# Patient Record
Sex: Female | Born: 1990 | Race: White | Hispanic: No | Marital: Married | Smoking: Current every day smoker
Health system: Southern US, Community
[De-identification: ages and names within clinical notes are randomized; demographics above are authoritative.]

## PROBLEM LIST (undated history)

## (undated) DIAGNOSIS — J45909 Unspecified asthma, uncomplicated: Secondary | ICD-10-CM

## (undated) DIAGNOSIS — E785 Hyperlipidemia, unspecified: Secondary | ICD-10-CM

## (undated) DIAGNOSIS — K219 Gastro-esophageal reflux disease without esophagitis: Secondary | ICD-10-CM

## (undated) DIAGNOSIS — R0602 Shortness of breath: Secondary | ICD-10-CM

## (undated) DIAGNOSIS — R51 Headache: Secondary | ICD-10-CM

## (undated) DIAGNOSIS — R519 Headache, unspecified: Secondary | ICD-10-CM

## (undated) DIAGNOSIS — M2550 Pain in unspecified joint: Secondary | ICD-10-CM

## (undated) DIAGNOSIS — E119 Type 2 diabetes mellitus without complications: Secondary | ICD-10-CM

## (undated) DIAGNOSIS — M549 Dorsalgia, unspecified: Secondary | ICD-10-CM

## (undated) DIAGNOSIS — F32A Depression, unspecified: Secondary | ICD-10-CM

## (undated) DIAGNOSIS — M1990 Unspecified osteoarthritis, unspecified site: Secondary | ICD-10-CM

## (undated) DIAGNOSIS — M92219 Osteochondrosis (juvenile) of carpal lunate [Kienbock], unspecified hand: Secondary | ICD-10-CM

## (undated) DIAGNOSIS — R600 Localized edema: Secondary | ICD-10-CM

## (undated) DIAGNOSIS — E7800 Pure hypercholesterolemia, unspecified: Secondary | ICD-10-CM

## (undated) DIAGNOSIS — O139 Gestational [pregnancy-induced] hypertension without significant proteinuria, unspecified trimester: Secondary | ICD-10-CM

## (undated) DIAGNOSIS — Z87442 Personal history of urinary calculi: Secondary | ICD-10-CM

## (undated) DIAGNOSIS — Z8489 Family history of other specified conditions: Secondary | ICD-10-CM

## (undated) DIAGNOSIS — G4733 Obstructive sleep apnea (adult) (pediatric): Secondary | ICD-10-CM

## (undated) DIAGNOSIS — H4710 Unspecified papilledema: Secondary | ICD-10-CM

## (undated) DIAGNOSIS — R7303 Prediabetes: Secondary | ICD-10-CM

## (undated) DIAGNOSIS — B009 Herpesviral infection, unspecified: Secondary | ICD-10-CM

## (undated) DIAGNOSIS — J40 Bronchitis, not specified as acute or chronic: Secondary | ICD-10-CM

## (undated) DIAGNOSIS — G2581 Restless legs syndrome: Secondary | ICD-10-CM

## (undated) DIAGNOSIS — F419 Anxiety disorder, unspecified: Secondary | ICD-10-CM

## (undated) DIAGNOSIS — I10 Essential (primary) hypertension: Secondary | ICD-10-CM

## (undated) DIAGNOSIS — S82892A Other fracture of left lower leg, initial encounter for closed fracture: Secondary | ICD-10-CM

## (undated) DIAGNOSIS — G932 Benign intracranial hypertension: Secondary | ICD-10-CM

## (undated) DIAGNOSIS — R000 Tachycardia, unspecified: Secondary | ICD-10-CM

## (undated) HISTORY — DX: Localized edema: R60.0

## (undated) HISTORY — DX: Pure hypercholesterolemia, unspecified: E78.00

## (undated) HISTORY — DX: Type 2 diabetes mellitus without complications: E11.9

## (undated) HISTORY — DX: Pain in unspecified joint: M25.50

## (undated) HISTORY — DX: Anxiety disorder, unspecified: F41.9

## (undated) HISTORY — PX: OTHER SURGICAL HISTORY: SHX169

## (undated) HISTORY — DX: Prediabetes: R73.03

## (undated) HISTORY — DX: Unspecified papilledema: H47.10

## (undated) HISTORY — DX: Benign intracranial hypertension: G93.2

## (undated) HISTORY — DX: Obstructive sleep apnea (adult) (pediatric): G47.33

## (undated) HISTORY — DX: Restless legs syndrome: G25.81

## (undated) HISTORY — DX: Shortness of breath: R06.02

## (undated) HISTORY — DX: Dorsalgia, unspecified: M54.9

## (undated) HISTORY — DX: Unspecified asthma, uncomplicated: J45.909

## (undated) HISTORY — DX: Hyperlipidemia, unspecified: E78.5

## (undated) HISTORY — DX: Gastro-esophageal reflux disease without esophagitis: K21.9

## (undated) HISTORY — DX: Gestational (pregnancy-induced) hypertension without significant proteinuria, unspecified trimester: O13.9

## (undated) HISTORY — DX: Tachycardia, unspecified: R00.0

## (undated) HISTORY — DX: Depression, unspecified: F32.A

## (undated) HISTORY — DX: Osteochondrosis (juvenile) of carpal lunate (kienbock), unspecified hand: M92.219

---
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
## 2011-06-13 DIAGNOSIS — I10 Essential (primary) hypertension: Secondary | ICD-10-CM

## 2011-06-13 HISTORY — PX: FRACTURE SURGERY: SHX138

## 2011-06-13 HISTORY — DX: Essential (primary) hypertension: I10

## 2011-12-04 DIAGNOSIS — I10 Essential (primary) hypertension: Secondary | ICD-10-CM | POA: Insufficient documentation

## 2011-12-04 DIAGNOSIS — I152 Hypertension secondary to endocrine disorders: Secondary | ICD-10-CM | POA: Insufficient documentation

## 2011-12-04 DIAGNOSIS — R000 Tachycardia, unspecified: Secondary | ICD-10-CM | POA: Insufficient documentation

## 2013-06-12 HISTORY — PX: WRIST SURGERY: SHX841

## 2014-01-21 DIAGNOSIS — E785 Hyperlipidemia, unspecified: Secondary | ICD-10-CM | POA: Insufficient documentation

## 2014-01-21 DIAGNOSIS — E7800 Pure hypercholesterolemia, unspecified: Secondary | ICD-10-CM | POA: Insufficient documentation

## 2014-01-21 DIAGNOSIS — E1169 Type 2 diabetes mellitus with other specified complication: Secondary | ICD-10-CM | POA: Insufficient documentation

## 2014-01-30 DIAGNOSIS — M931 Kienbock's disease of adults: Secondary | ICD-10-CM | POA: Insufficient documentation

## 2014-04-28 DIAGNOSIS — M25539 Pain in unspecified wrist: Secondary | ICD-10-CM | POA: Insufficient documentation

## 2015-06-12 ENCOUNTER — Emergency Department (HOSPITAL_COMMUNITY): Payer: BLUE CROSS/BLUE SHIELD

## 2015-06-12 ENCOUNTER — Emergency Department (HOSPITAL_COMMUNITY)
Admission: EM | Admit: 2015-06-12 | Discharge: 2015-06-12 | Disposition: A | Discharge status: Home / Self Care | Payer: BLUE CROSS/BLUE SHIELD | Attending: Emergency Medicine | Admitting: Emergency Medicine

## 2015-06-12 ENCOUNTER — Encounter (HOSPITAL_COMMUNITY): Payer: Self-pay | Admitting: Nurse Practitioner

## 2015-06-12 DIAGNOSIS — I10 Essential (primary) hypertension: Secondary | ICD-10-CM | POA: Insufficient documentation

## 2015-06-12 DIAGNOSIS — M19032 Primary osteoarthritis, left wrist: Secondary | ICD-10-CM | POA: Diagnosis not present

## 2015-06-12 DIAGNOSIS — M19072 Primary osteoarthritis, left ankle and foot: Secondary | ICD-10-CM | POA: Insufficient documentation

## 2015-06-12 DIAGNOSIS — Z8781 Personal history of (healed) traumatic fracture: Secondary | ICD-10-CM | POA: Insufficient documentation

## 2015-06-12 DIAGNOSIS — G932 Benign intracranial hypertension: Secondary | ICD-10-CM | POA: Diagnosis not present

## 2015-06-12 DIAGNOSIS — Z8709 Personal history of other diseases of the respiratory system: Secondary | ICD-10-CM | POA: Insufficient documentation

## 2015-06-12 DIAGNOSIS — H539 Unspecified visual disturbance: Secondary | ICD-10-CM | POA: Diagnosis present

## 2015-06-12 HISTORY — DX: Essential (primary) hypertension: I10

## 2015-06-12 HISTORY — DX: Unspecified osteoarthritis, unspecified site: M19.90

## 2015-06-12 HISTORY — DX: Other fracture of left lower leg, initial encounter for closed fracture: S82.892A

## 2015-06-12 HISTORY — DX: Bronchitis, not specified as acute or chronic: J40

## 2015-06-12 MED ORDER — ACETAZOLAMIDE 125 MG PO TABS: 325.0000 mg | ORAL_TABLET | Freq: Three times a day (TID) | ORAL | Status: DC

## 2015-06-12 MED ORDER — ACETAZOLAMIDE 125 MG PO TABS: 125.0000 mg | ORAL_TABLET | Freq: Three times a day (TID) | ORAL | Status: DC

## 2015-06-12 MED ORDER — LISINOPRIL 10 MG PO TABS: 10.0000 mg | ORAL_TABLET | Freq: Every day | ORAL | Status: DC

## 2015-06-12 NOTE — ED Provider Notes (Signed)
Medical screening examination/treatment/procedure(s) were conducted as a shared visit with non-physician practitioner(s) and myself.  I personally evaluated the patient during the encounter.  Pt is medical stable in no acute distress.  Hx of pseudotumor.  Has not been on meds for years.  Pt has had prior MRI and LP to confirm the diagnosis in the past.  Pt was seen by an optometrist on Wednesday to be evaluated in the ED.  Unfortunately, pt does not fit in the MRI at this facility.  Pt will require fluoro guided lp because of her body size.  This is not available on Saturday.    I do not think she requires emergent LP.  Her diagnosis is known.  Therapeutic lp may be beneficial but this can be scheduled.  Will start her back on her BP meds and diamox.  Urgent referral to neurology.  Linwood DibblesJon Knapp, MD 06/12/15 [phone]

## 2015-06-12 NOTE — ED Provider Notes (Signed)
[name]: [id number]     Arrival date & time 06/12/15  1356 History   First MD Initiated Contact with Patient 06/12/15 1400     Chief Complaint  Patient presents with  . Eye Problem    HPI   Cassandra Tucker is a 24 y.o. female with a PMH of psuedotumor cerebri who presents to the ED with optic disc edema. She was sent by her optometrist after being evaluated on Wednesday. She reports headache for the past week with associated dizziness and lightheadedness. She also states she has experienced blurry vision. She denies exacerbating factors. She has not tried anything for symptom relief. She denies fever, chills, chest pain, shortness of breath, abdominal pain, N/V/D/C, numbness, weakness, paresthesia. She states she was previously being followed by neurology and was taking diamox for her pseudotumor cerebri, however has not been seen and has not been taking her medication for the past 3 years.   Past Medical History  Diagnosis Date  . Arthritis     left wrist and left ankle  . Bronchitis   . Hypertension 2013  . Ankle fracture, left 2000   Past Surgical History  Procedure Laterality Date  . Fracture surgery     No family history on file. Social History  Substance Use Topics  . Smoking status: None  . Smokeless tobacco: None  . Alcohol Use: None   OB History    No data available       Review of Systems  Constitutional: Negative for fever and chills.  Eyes: Positive for visual disturbance.  Respiratory: Negative for shortness of breath.   Cardiovascular: Negative for chest pain.  Gastrointestinal: Negative for nausea, vomiting, abdominal pain, diarrhea and constipation.  Neurological: Positive for dizziness, light-headedness and headaches. Negative for syncope, weakness and numbness.  All other systems reviewed and are negative.     Allergies  Review of patient's allergies indicates no known allergies.  Home Medications   Prior to Admission medications   Medication  Sig Start Date End Date Taking? Authorizing Provider  ibuprofen (ADVIL,MOTRIN) 200 MG tablet Take 400 mg by mouth every 6 (six) hours as needed for moderate pain.   Yes Historical Provider, MD  acetaZOLAMIDE (DIAMOX) 125 MG tablet Take 2.5 tablets (312.5 mg total) by mouth 3 (three) times daily. 06/12/15   Mady GemmaElizabeth C Westfall, PA-C  lisinopril (PRINIVIL,ZESTRIL) 10 MG tablet Take 1 tablet (10 mg total) by mouth daily. 06/12/15   Mady GemmaElizabeth C Westfall, PA-C    BP 198/123 mmHg  Pulse 95  Temp(Src) 98.3 F (36.8 C) (Oral)  Resp 21  SpO2 98% Physical Exam  Constitutional: She is oriented to person, place, and time. She appears well-developed and well-nourished. No distress.  HENT:  Head: Normocephalic and atraumatic.  Right Ear: External ear normal.  Left Ear: External ear normal.  Nose: Nose normal.  Mouth/Throat: Uvula is midline, oropharynx is clear and moist and mucous membranes are normal.  Eyes: Conjunctivae, EOM and lids are normal. Pupils are equal, round, and reactive to light. Right eye exhibits no discharge. Left eye exhibits no discharge. No scleral icterus.  Neck: Normal range of motion. Neck supple.  Cardiovascular: Normal rate, regular rhythm, normal heart sounds, intact distal pulses and normal pulses.   Pulmonary/Chest: Effort normal and breath sounds normal. No respiratory distress. She has no wheezes. She has no rales.  Abdominal: Soft. Normal appearance and bowel sounds are normal. She exhibits no distension and no mass. There is no tenderness. There is no rigidity, no  rebound and no guarding.  Musculoskeletal: Normal range of motion. She exhibits no edema or tenderness.  Neurological: She is alert and oriented to person, place, and time. She has normal strength. No cranial nerve deficit or sensory deficit.  Skin: Skin is warm, dry and intact. No rash noted. She is not diaphoretic. No erythema. No pallor.  Psychiatric: She has a normal mood and affect. Her speech is normal  and behavior is normal.  Nursing note and vitals reviewed.   ED Course  Procedures (including critical care time)  Labs Review Labs Reviewed - No data to display  Imaging Review No results found.     EKG Interpretation None      MDM   Final diagnoses:  Pseudotumor cerebri    25 year old female with history of pseudotumor cerebri presents after being referred by her optometrist for optic disc edema. She was advised to follow up in the ED for MRI and LP. Patient reports headache for the past week as well as associated dizziness, lightheadedness, and blurry vision. She denies fever, chills, chest pain, shortness of breath, abdominal pain, N/V/D/C, numbness, weakness, paresthesia. Of note, patient was previously treated for pseudotumor cerebri at [facility] in [place], however has not been seen by her neurologist and has not been taking diamox for the past 3 years (has had MRI and LP to confirm diagnosis). She also reports she is supposed to be taking lisinopril for hypertension, however has not been taking this medication for "a long time."   Patient is afebrile. Mildly hypertensive. Heart regular rate and rhythm. Lungs clear to auscultation bilaterally. Abdomen soft, nontender, nondistended. Normal neuro exam with no focal deficit.  MRI ordered, however advised by radiology the patient is not able to fit in the MRI here. Due to patient's body habitus, LP will likely need to be done by radiology.  Spoke with Dr. Benard Rink, neuroradiologist, who advised MRI and fluoro LP is not urgent given patient's known history of pseudotumor cerebri and patient can call next week to schedule as outpatient.   Patient discussed with and seen by Dr. Lynelle Doctor. Patient is nontoxic and well-appearing, feel she is stable for discharge at this time. MRI and LP not emergent given known diagnosis of pseudotumor cerebri. Patient to follow up with neurology, ambulatory referral ordered. Will refill lisinopril and  diamox. Return precautions discussed at length. Patient verbalizes her understanding and is in agreement with plan.   BP 198/123 mmHg  Pulse 95  Temp(Src) 98.3 F (36.8 C) (Oral)  Resp 21  SpO2 98%   Mady Gemma, PA-C 06/12/15 1624

## 2015-06-12 NOTE — Discharge Instructions (Signed)
1. Medications: lisinopril, diamox, usual home medications 2. Treatment: rest, drink plenty of fluids 3. Follow Up: please followup with neurology (they will call you) for discussion of your diagnoses and further evaluation after today's visit; if you do not have a primary care doctor use the resource guide provided to find one; please return to the ER for severe pain, dizziness, numbness, weakness, new or worsening symptoms    Pseudotumor Cerebri Pseudotumor cerebri, also called idiopathic intracranial hypertension, is a condition that occurs due to increased pressure within your skull. Although some of the symptoms resemble those of a brain tumor, it is not a brain tumor. Symptoms occur when the increased pressure in your skull compresses brain structures. For example, pressure on the nerve responsible for vision (optic nerve) causes it to swell, resulting in visual symptoms. Pseudotumor cerebri tends to occur in obese women younger than 23 years of age. However, men and children can also develop this condition. SYMPTOMS  Symptoms of pseudotumor cerebri occur due to increased pressure within the skull. Symptoms may include:   Headaches.  Nausea and vomiting.  Dizziness.  High blood pressure.   Ringing in the ears.  Double or blurred vision.  Brief episodes of complete loss of vision.  Pain in the back, neck, or shoulders. DIAGNOSIS  Pseudotumor cerebri is diagnosed through:  A detailed eye exam, which can reveal a swollen optic nerve, as well as identifying issues such as blind spots in the vision.  An MRI or CT scan to rule out other disorders that can cause similar symptoms, such as brain tumors.  A spinal tap (lumbar puncture), which can demonstrate increased pressure within the skull. TREATMENT  There are several ways that pseudotumor cerebri is treated, including:  Medicines to decrease the production of spinal fluid and lower the pressure within your  skull.  Medicines to prevent or treat headaches.  Surgery to create an opening in your optic nerve to allow excess fluid to drain out.  Surgery to place drains (shunts) in your brain to remove excess fluid. HOME CARE INSTRUCTIONS   Take all medicines as directed by your health care provider.  Go to all of your follow-up appointments.  Lose weight if you are overweight. SEEK MEDICAL CARE IF:  Any symptoms come back.  You develop trouble with hearing, vision, balance, or your sense of smell.  You cannot eat or drink what you need.  You are more weak or tired than usual.   You are losing weight without trying. SEEK IMMEDIATE MEDICAL CARE IF:  You have new symptoms such as vision problems or difficulty walking.   You have a seizure.   You have trouble breathing.   You have a fever.    This information is not intended to replace advice given to you by your health care provider. Make sure you discuss any questions you have with your health care provider.   Document Released: 06/03/2013 Document Reviewed: 06/03/2013 Elsevier Interactive Patient Education [employer]2016 Elsevier Inc.

## 2015-06-12 NOTE — ED Notes (Signed)
Pt sent by opthamologist for MRI, spinal tap, and neurologist consult regarding optic nerve swelling.

## 2015-06-16 ENCOUNTER — Ambulatory Visit (INDEPENDENT_AMBULATORY_CARE_PROVIDER_SITE_OTHER): Payer: BLUE CROSS/BLUE SHIELD | Admitting: Neurology

## 2015-06-16 ENCOUNTER — Encounter: Payer: Self-pay | Admitting: Neurology

## 2015-06-16 ENCOUNTER — Telehealth: Payer: Self-pay | Admitting: *Deleted

## 2015-06-16 VITALS — BP 183/116 | HR 115 | Ht 65.0 in | Wt >= 6400 oz

## 2015-06-16 DIAGNOSIS — R519 Headache, unspecified: Secondary | ICD-10-CM | POA: Insufficient documentation

## 2015-06-16 DIAGNOSIS — H4710 Unspecified papilledema: Secondary | ICD-10-CM

## 2015-06-16 DIAGNOSIS — G4459 Other complicated headache syndrome: Secondary | ICD-10-CM | POA: Diagnosis not present

## 2015-06-16 DIAGNOSIS — R51 Headache: Secondary | ICD-10-CM

## 2015-06-16 DIAGNOSIS — H539 Unspecified visual disturbance: Secondary | ICD-10-CM | POA: Diagnosis not present

## 2015-06-16 NOTE — Patient Instructions (Addendum)
Remember to drink plenty of fluid, eat healthy meals and do not skip any meals. Try to eat protein with a every meal and eat a healthy snack such as fruit or nuts in between meals. Try to keep a regular sleep-wake schedule and try to exercise daily, particularly in the form of walking, 20-30 minutes a day, if you can.   As far as your medications are concerned, I would like to suggest: Continue Diamox as prescribed  As far as diagnostic testing: MRi of the brain, labs, LP  Please also call us for any test results so we can go over those with you on the phone.  My clinical assistant and will answer any of your questions and relay your messages to me and also relay most of my messages to you.   Our phone number is [phone]. We also have an after hours call service for urgent matters and there is a physician on-call for urgent questions. For any emergencies you know to call 911 or go to the nearest emergency room

## 2015-06-16 NOTE — Progress Notes (Addendum)
[facility]    Provider:  Dr Lucia Gaskins Referring Provider: Mady Gemma, * Primary Care Physician:  No primary care provider on file.  CC:  pseudotumore cerebri  HPI:  Cassandra Tucker is a 25 y.o. 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 She has been having headaches for 8-9 days. She was diagnosed 3 years ago. She had lumbar puncture 3 years ago and it was 50. She stopped the medication about 2 years ago on her own because she was feeling fine. Hasn't had any problems until 2 weeks ago. She was having bad headaches, saw the doctor and said the swelling was back. The headaches are pressure all over. They are continuous. She has headaches when she wakes up. She has untreated sleep apnea. She can't tolerate the mask. Er started the medication. Had MRi of the brain 3 years ago. Headaches can be 10/10 pain, she also has shooting pain. She feels pressure in the ears. She has had episodic blurry vision. She just started the medication prescribed in the ED. She was prescribed 125mg  pills and instructed to take 2.5 pills three times a day.   Reviewed notes, labs and imaging from outside physicians, which showed:  She was seen in the ED on 06/12/2015 for optic disk edema and headches. She was sent by her optometrist after eye exam. She reported headaches for a week with dizziness and lightheadedness. She denied fever, chills, chest pain, shortness of breath, abdominal pain, N/V/D/C, numbness, weakness, paresthesia. She stopped her diamox years ago. She also reported non compliance with lisinopril. She was started on Lisinopril and diamox and discharged with OP neurology follow up.  Review of Systems: Patient complains of symptoms per HPI as well as the following symptoms: No CP, No SOB. Pertinent negatives per HPI. All others negative.   Social History   Social History  . Marital Status: Single    Spouse Name: N/A  . Number of Children: 0  . Years of  Education: 12+   Occupational History  . [employer]    Social History Main Topics  . Smoking status: Never Smoker   . Smokeless tobacco: Not on file  . Alcohol Use: 0.0 oz/week    0 Standard drinks or equivalent per week     Comment: socially  . Drug Use: No  . Sexual Activity: Not on file   Other Topics Concern  . Not on file   Social History Narrative   Lives with grandparents   Caffeine use: Drinks coffee/tea/soda- 20oz per day (none after starting diamox)    Family History  Problem Relation Age of Onset  . Migraines Neg Hx     Past Medical History  Diagnosis Date  . Arthritis     left wrist and left ankle  . Bronchitis   . Hypertension 2013  . Ankle fracture, left 2000    Past Surgical History  Procedure Laterality Date  . Fracture surgery  2005  . Wrist surgery  2015    Current Outpatient Prescriptions  Medication Sig Dispense Refill  . acetaZOLAMIDE (DIAMOX) 125 MG tablet Take 2.5 tablets (312.5 mg total) by mouth 3 (three) times daily. 200 tablet 0  . ibuprofen (ADVIL,MOTRIN) 200 MG tablet Take 400 mg by mouth every 6 (six) hours as needed for moderate pain.    Marland Kitchen lisinopril (PRINIVIL,ZESTRIL) 10 MG tablet Take 1 tablet (10 mg total) by mouth daily. 30 tablet 0   No current facility-administered medications for this visit.  Allergies as of 06/16/2015  . (No Known Allergies)    Vitals: BP 183/116 mmHg  Pulse 115  Ht 5\' 5"  (1.651 m)  Wt 418 lb 3.2 oz (189.694 kg)  BMI 69.59 kg/m2 Last Weight:  Wt Readings from Last 1 Encounters:  06/16/15 418 lb 3.2 oz (189.694 kg)   Last Height:   Ht Readings from Last 1 Encounters:  06/16/15 5\' 5"  (1.651 m)   Physical exam: Exam: Gen: NAD, conversant, well nourised, morbidly obese, well groomed                     CV: RRR, no MRG. No Carotid Bruits. No peripheral edema, warm, nontender Eyes: Conjunctivae clear without exudates or hemorrhage  Neuro: Detailed Neurologic Exam  Speech:     Speech is normal; fluent and spontaneous with normal comprehension.  Cognition:    The patient is oriented to person, place, and time;     recent and remote memory intact;     language fluent;     normal attention, concentration,     fund of knowledge Cranial Nerves: papilledema R>L    The pupils are equal, round, and reactive to light. Visual fields are full to finger confrontation. Extraocular movements are intact. Trigeminal sensation is intact and the muscles of mastication are normal. The face is symmetric. The palate elevates in the midline. Hearing intact. Voice is normal. Shoulder shrug is normal. The tongue has normal motion without fasciculations.   Coordination:    No dysmetria  Gait:    Wide-based due to large body habitus  Motor Observation:    No asymmetry, no atrophy, and no involuntary movements noted. Tone:    Normal muscle tone.    Posture:    Posture is normal. normal erect    Strength:    Strength is V/V in the upper and lower limbs.      Sensation: intact to LT     Reflex Exam:  DTR's:    Deep tendon reflexes in the upper and lower extremities are normal bilaterally.   Toes:    The toes are downgoing bilaterally.   Clonus:    Clonus is absent.       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  Discussed weight loss, eating healthy, possibly bariatric surgery.  As far as your medications are concerned, I would like to suggest: Continue Diamox as prescribed. In 4 weeks can increase to 500mg  tid if tolerating current dose.  As far as diagnostic testing: MRi of the brain, labs, Lumbar puncture. Discussed that untreated IIH can cause permanent vision loss, stressed medication compliance and weight loss, if vision worsens call office or proceed to ED. Will request optometry records  Addendum: from [facility] center appt 06/09/2015: Hx of IIH Dxed in 2013 she was on 325 diamox tid. Has  been non-compliant with neurologist evaluations and treatment. Best corrected vision 20/20 in both eyes. APD right eye. 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 No hemorrhages noted. Humphrey VF demonstrated blind spot enlargement worst in the right eye than the left. She was referred to the [facility] ED for LP, MRi and treatment with diamox.   To prevent or relieve headaches, try the following: Cool Compress. Lie down and place a cool compress on your head.  Avoid headache triggers. If certain foods or odors seem to have triggered your migraines in the  past, avoid them. A headache diary might help you identify triggers.  Include physical activity in your daily routine. Try a daily walk or other moderate aerobic exercise.  Manage stress. Find healthy ways to cope with the stressors, such as delegating tasks on your to-do list.  Practice relaxation techniques. Try deep breathing, yoga, massage and visualization.  Eat regularly. Eating regularly scheduled meals and maintaining a healthy diet might help prevent headaches. Also, drink plenty of fluids.  Follow a regular sleep schedule. Sleep deprivation might contribute to headaches Consider biofeedback. With this mind-body technique, you learn to control certain bodily functions - such as muscle tension, heart rate and blood pressure - to prevent headaches or reduce headache pain.    Proceed to emergency room if you experience new or worsening symptoms or symptoms do not resolve, if you have new neurologic symptoms or if headache is severe, or for any concerning symptom.   CC: Dr. Beatris SiWestfall   Aylen Stradford, MD  [facility] [address] [place], [place] [id number]  Phone [phone] Fax [phone]

## 2015-06-16 NOTE — Telephone Encounter (Signed)
Release faxed to Dr. Despina AriasYen Le requesting notes on 06/16/2015.

## 2015-06-17 ENCOUNTER — Other Ambulatory Visit: Payer: Self-pay | Admitting: Neurology

## 2015-06-17 ENCOUNTER — Other Ambulatory Visit: Payer: Self-pay | Admitting: *Deleted

## 2015-06-17 ENCOUNTER — Telehealth: Payer: Self-pay | Admitting: *Deleted

## 2015-06-17 DIAGNOSIS — G4459 Other complicated headache syndrome: Secondary | ICD-10-CM

## 2015-06-17 DIAGNOSIS — H539 Unspecified visual disturbance: Secondary | ICD-10-CM

## 2015-06-17 DIAGNOSIS — H4710 Unspecified papilledema: Secondary | ICD-10-CM

## 2015-06-17 LAB — COMPREHENSIVE METABOLIC PANEL
ALBUMIN: 4.5 g/dL (ref 3.5–5.5)
ALK PHOS: 64 IU/L (ref 39–117)
ALT: 25 IU/L (ref 0–32)
AST: 19 IU/L (ref 0–40)
Albumin/Globulin Ratio: 1.5 (ref 1.1–2.5)
BILIRUBIN TOTAL: 0.7 mg/dL (ref 0.0–1.2)
BUN/Creatinine Ratio: 11 (ref 8–20)
BUN: 8 mg/dL (ref 6–20)
CHLORIDE: 102 mmol/L (ref 96–106)
CO2: 24 mmol/L (ref 18–29)
CREATININE: 0.7 mg/dL (ref 0.57–1.00)
Calcium: 10 mg/dL (ref 8.7–10.2)
GFR calc Af Amer: 141 mL/min/{1.73_m2} (ref >59)
GFR calc non Af Amer: 123 mL/min/{1.73_m2} (ref >59)
GLUCOSE: 138 mg/dL — AB (ref 65–99)
Globulin, Total: 3 g/dL (ref 1.5–4.5)
Potassium: 4.4 mmol/L (ref 3.5–5.2)
Sodium: 143 mmol/L (ref 134–144)
Total Protein: 7.5 g/dL (ref 6.0–8.5)

## 2015-06-17 LAB — THYROID PANEL WITH TSH
FREE THYROXINE INDEX: 2.3 (ref 1.2–4.9)
T3 UPTAKE RATIO: 23 % — AB (ref 24–39)
T4 TOTAL: 10.1 ug/dL (ref 4.5–12.0)
TSH: 3.44 u[IU]/mL (ref 0.450–4.500)

## 2015-06-17 NOTE — Telephone Encounter (Signed)
Called and spoke to PeabodyKenisha again. She can see order and will call pt to schedule LP after 06/24/14 (pt MRI is scheduled this day) per Dr Lucia GaskinsAhern request.

## 2015-06-17 NOTE — Telephone Encounter (Signed)
Call[facility] and [facility] [place] at [facility] to [facility] LP due to weight greater than 400lb. Advised I was [facility] because of weight limit.

## 2015-06-17 NOTE — Telephone Encounter (Signed)
[facility] called and advised pt cannot have LP done at their office because she is over weight limit of 400lb. I [facility] to [facility] and spoke to AnnistonMisty. She stated [facility]. Will have to be scheduled in fluro room 1. Phone number to call and schedule: [phone].

## 2015-06-18 ENCOUNTER — Telehealth: Payer: Self-pay

## 2015-06-18 NOTE — Telephone Encounter (Signed)
Called patient. Gave lab results. Patient verbalized understanding.  

## 2015-06-18 NOTE — Telephone Encounter (Signed)
-----   Message from Anson FretAntonia B Ahern, MD sent at 06/17/2015  4:59 PM EST ----- Labs unremarkable thanks

## 2015-06-25 ENCOUNTER — Other Ambulatory Visit: Payer: BLUE CROSS/BLUE SHIELD

## 2015-07-01 ENCOUNTER — Inpatient Hospital Stay (HOSPITAL_COMMUNITY): Admission: RE | Admit: 2015-07-01 | Payer: BLUE CROSS/BLUE SHIELD | Source: Ambulatory Visit

## 2015-07-01 ENCOUNTER — Other Ambulatory Visit: Payer: BLUE CROSS/BLUE SHIELD

## 2015-07-01 ENCOUNTER — Inpatient Hospital Stay: Admission: RE | Admit: 2015-07-01 | Payer: BLUE CROSS/BLUE SHIELD | Source: Ambulatory Visit

## 2015-07-04 ENCOUNTER — Ambulatory Visit
Admission: RE | Admit: 2015-07-04 | Discharge: 2015-07-04 | Disposition: A | Discharge status: Home / Self Care | Payer: BLUE CROSS/BLUE SHIELD | Source: Ambulatory Visit | Attending: Neurology | Admitting: Neurology

## 2015-07-04 DIAGNOSIS — H4710 Unspecified papilledema: Secondary | ICD-10-CM | POA: Diagnosis not present

## 2015-07-04 DIAGNOSIS — H539 Unspecified visual disturbance: Secondary | ICD-10-CM | POA: Diagnosis not present

## 2015-07-04 DIAGNOSIS — G4459 Other complicated headache syndrome: Secondary | ICD-10-CM | POA: Diagnosis not present

## 2015-07-04 MED ORDER — GADOBENATE DIMEGLUMINE 529 MG/ML IV SOLN
20.0000 mL | Freq: Once | INTRAVENOUS | Status: AC | PRN
  Administered 2015-07-04: 20 mL via INTRAVENOUS

## 2015-07-06 ENCOUNTER — Telehealth: Payer: Self-pay | Admitting: *Deleted

## 2015-07-06 NOTE — Telephone Encounter (Signed)
Tried returning call from pt. LVM for her to call about results.

## 2015-07-06 NOTE — Telephone Encounter (Signed)
Pt returned Emma's call °

## 2015-07-06 NOTE — Telephone Encounter (Signed)
-----   Message from Anson Fret, MD sent at 07/05/2015  6:35 PM EST ----- MRi of the brain is consistent with IIH or pseudotumor cerebri. She should be taking the diamox. thanks

## 2015-07-06 NOTE — Telephone Encounter (Signed)
Pt called office back. Relayed results below per Dr Lucia Gaskins. She verbalized understanding.

## 2015-07-06 NOTE — Telephone Encounter (Signed)
LVM for pt to call about results. Gave [facility] phone number.  

## 2015-07-06 NOTE — Telephone Encounter (Signed)
Pt returned Cassandra Tucker's call. She can not talk while she is working. Cassandra Tucker picked this call up!

## 2015-07-09 ENCOUNTER — Other Ambulatory Visit: Payer: Self-pay | Admitting: Diagnostic Radiology

## 2015-07-09 ENCOUNTER — Ambulatory Visit (HOSPITAL_COMMUNITY)
Admission: RE | Admit: 2015-07-09 | Discharge: 2015-07-09 | Disposition: A | Discharge status: Home / Self Care | Payer: BLUE CROSS/BLUE SHIELD | Source: Ambulatory Visit | Attending: Neurology | Admitting: Neurology

## 2015-07-09 DIAGNOSIS — H4710 Unspecified papilledema: Secondary | ICD-10-CM

## 2015-07-09 DIAGNOSIS — Z6841 Body Mass Index (BMI) 40.0 and over, adult: Secondary | ICD-10-CM | POA: Insufficient documentation

## 2015-07-09 DIAGNOSIS — H539 Unspecified visual disturbance: Secondary | ICD-10-CM | POA: Diagnosis not present

## 2015-07-09 DIAGNOSIS — G4459 Other complicated headache syndrome: Secondary | ICD-10-CM | POA: Insufficient documentation

## 2015-07-09 LAB — CSF CELL COUNT WITH DIFFERENTIAL
RBC COUNT CSF: 2 /mm3 — AB
Tube #: 4
WBC CSF: 1 /mm3 (ref 0–5)

## 2015-07-09 LAB — PROTEIN, CSF: Total  Protein, CSF: 25 mg/dL (ref 15–45)

## 2015-07-09 LAB — GLUCOSE, CSF: Glucose, CSF: 81 mg/dL — ABNORMAL HIGH (ref 40–70)

## 2015-07-09 MED ORDER — ACETAMINOPHEN 500 MG PO TABS: 1000.0000 mg | ORAL_TABLET | Freq: Four times a day (QID) | ORAL | Status: DC | PRN

## 2015-07-12 ENCOUNTER — Telehealth: Payer: Self-pay | Admitting: *Deleted

## 2015-07-12 NOTE — Telephone Encounter (Signed)
Called pt back. Relayed results per Dr Lucia Gaskins note below. She also wanted to know if Dr Lucia Gaskins can fill out FMLA paperwork. She can only get so many points for work. Advised there is a fee for FMLA paperwork and Dr Lucia Gaskins has at least 14 days to complete. She is going to bring Thursday for Dr Lucia Gaskins to review first.

## 2015-07-12 NOTE — Telephone Encounter (Signed)
LVM for pt to call about results. Gave [facility] phone number.  

## 2015-07-12 NOTE — Telephone Encounter (Signed)
-----   Message from Anson Fret, MD sent at 07/12/2015  8:36 AM EST ----- Patient's opening pressure was 32 which is elevated consistent with intracranial hypertension. She should continue the diamox and follow up with me (she has a follow up on the 2nd) thanks

## 2015-07-13 ENCOUNTER — Encounter (HOSPITAL_COMMUNITY): Payer: Self-pay

## 2015-07-13 ENCOUNTER — Telehealth: Payer: Self-pay | Admitting: *Deleted

## 2015-07-13 ENCOUNTER — Telehealth: Payer: Self-pay | Admitting: Neurology

## 2015-07-13 ENCOUNTER — Emergency Department (HOSPITAL_COMMUNITY)
Admission: EM | Admit: 2015-07-13 | Discharge: 2015-07-13 | Disposition: A | Discharge status: Home / Self Care | Payer: BLUE CROSS/BLUE SHIELD | Attending: Emergency Medicine | Admitting: Emergency Medicine

## 2015-07-13 DIAGNOSIS — M19072 Primary osteoarthritis, left ankle and foot: Secondary | ICD-10-CM | POA: Diagnosis not present

## 2015-07-13 DIAGNOSIS — Z79899 Other long term (current) drug therapy: Secondary | ICD-10-CM | POA: Insufficient documentation

## 2015-07-13 DIAGNOSIS — Z8781 Personal history of (healed) traumatic fracture: Secondary | ICD-10-CM | POA: Diagnosis not present

## 2015-07-13 DIAGNOSIS — Z8709 Personal history of other diseases of the respiratory system: Secondary | ICD-10-CM | POA: Diagnosis not present

## 2015-07-13 DIAGNOSIS — G971 Other reaction to spinal and lumbar puncture: Secondary | ICD-10-CM

## 2015-07-13 DIAGNOSIS — M19032 Primary osteoarthritis, left wrist: Secondary | ICD-10-CM | POA: Insufficient documentation

## 2015-07-13 DIAGNOSIS — Z3202 Encounter for pregnancy test, result negative: Secondary | ICD-10-CM | POA: Insufficient documentation

## 2015-07-13 DIAGNOSIS — I10 Essential (primary) hypertension: Secondary | ICD-10-CM | POA: Diagnosis not present

## 2015-07-13 DIAGNOSIS — R51 Headache: Secondary | ICD-10-CM | POA: Diagnosis present

## 2015-07-13 LAB — I-STAT BETA HCG BLOOD, ED (MC, WL, AP ONLY)

## 2015-07-13 LAB — I-STAT CHEM 8, ED
BUN: 7 mg/dL (ref 6–20)
CREATININE: 0.5 mg/dL (ref 0.44–1.00)
Calcium, Ion: 1.15 mmol/L (ref 1.12–1.23)
Chloride: 104 mmol/L (ref 101–111)
Glucose, Bld: 89 mg/dL (ref 65–99)
HEMATOCRIT: 41 % (ref 36.0–46.0)
Hemoglobin: 13.9 g/dL (ref 12.0–15.0)
Potassium: 4 mmol/L (ref 3.5–5.1)
Sodium: 139 mmol/L (ref 135–145)
TCO2: 24 mmol/L (ref 0–100)

## 2015-07-13 MED ORDER — METHYLPREDNISOLONE SODIUM SUCC 125 MG IJ SOLR
125.0000 mg | Freq: Once | INTRAMUSCULAR | Status: AC
  Administered 2015-07-13: 125 mg via INTRAVENOUS
  Filled 2015-07-13: qty 2

## 2015-07-13 MED ORDER — ACETAZOLAMIDE 250 MG PO TABS
375.0000 mg | ORAL_TABLET | Freq: Three times a day (TID) | ORAL | Status: DC
  Administered 2015-07-13: 375 mg via ORAL
  Filled 2015-07-13: qty 2

## 2015-07-13 MED ORDER — LISINOPRIL 10 MG PO TABS
10.0000 mg | ORAL_TABLET | Freq: Every day | ORAL | Status: DC
  Administered 2015-07-13: 10 mg via ORAL
  Filled 2015-07-13: qty 1

## 2015-07-13 MED ORDER — MAGNESIUM SULFATE 2 GM/50ML IV SOLN
2.0000 g | Freq: Once | INTRAVENOUS | Status: AC
  Administered 2015-07-13: 2 g via INTRAVENOUS
  Filled 2015-07-13: qty 50

## 2015-07-13 MED ORDER — BUTALBITAL-APAP-CAFFEINE 50-325-40 MG PO TABS: 1.0000 | ORAL_TABLET | Freq: Four times a day (QID) | ORAL | Status: DC | PRN

## 2015-07-13 MED ORDER — MORPHINE SULFATE (PF) 4 MG/ML IV SOLN
4.0000 mg | Freq: Once | INTRAVENOUS | Status: AC
  Administered 2015-07-13: 4 mg via INTRAVENOUS
  Filled 2015-07-13: qty 1

## 2015-07-13 MED ORDER — SODIUM CHLORIDE 0.9 % IV BOLUS (SEPSIS)
1000.0000 mL | Freq: Once | INTRAVENOUS | Status: AC
  Administered 2015-07-13: 1000 mL via INTRAVENOUS

## 2015-07-13 MED ORDER — PROCHLORPERAZINE EDISYLATE 5 MG/ML IJ SOLN
10.0000 mg | Freq: Once | INTRAMUSCULAR | Status: AC
  Administered 2015-07-13: 10 mg via INTRAVENOUS
  Filled 2015-07-13: qty 2

## 2015-07-13 MED ORDER — ACETAZOLAMIDE 125 MG PO TABS: 325.0000 mg | ORAL_TABLET | Freq: Three times a day (TID) | ORAL | Status: DC

## 2015-07-13 NOTE — ED Provider Notes (Signed)
[name]: [id number]     Arrival date & time 07/13/15  1908 History   First MD Initiated Contact with Patient 07/13/15 1956     Chief Complaint  Patient presents with  . Headache     (Consider location/radiation/quality/duration/timing/severity/associated sxs/prior Treatment) HPI   Blood pressure 129/94, pulse 103, temperature 98.3 F (36.8 C), temperature source Oral, resp. rate 20, height  (1.727 m), weight 181.439 kg, last menstrual period 06/21/2015, SpO2 99 %.  Cassandra Tucker is a 25 y.o. female with past medical history significant for pseudotumor cerebra he and hypertension complaining of headache which she rates at 10 out of 10, in a Mohawk pattern status post LP 4 days ago. Headache more severe than typical. Patient has ran out of her blood pressure medication and Diamox 3 days ago. She's been taking ibuprofen at home with little relief. Patient denies change in her vision, fever, chills, meningismus, back pain, unilateral weakness, difficulties with balance.  Past Medical History  Diagnosis Date  . Arthritis     left wrist and left ankle  . Bronchitis   . Hypertension 2013  . Ankle fracture, left 2000   Past Surgical History  Procedure Laterality Date  . Fracture surgery  2005  . Wrist surgery  2015   Family History  Problem Relation Age of Onset  . Migraines Neg Hx    Social History  Substance Use Topics  . Smoking status: Never Smoker   . Smokeless tobacco: None  . Alcohol Use: 0.0 oz/week    0 Standard drinks or equivalent per week     Comment: socially   OB History    No data available     Review of Systems  10 systems reviewed and found to be negative, except as noted in the HPI.   Allergies  Review of patient's allergies indicates no known allergies.  Home Medications   Prior to Admission medications   Medication Sig Start Date End Date Taking? Authorizing Provider  ibuprofen (ADVIL,MOTRIN) 200 MG tablet Take 400 mg by mouth every 6 (six)  hours as needed for headache or moderate pain.    Yes Historical Provider, MD  acetaZOLAMIDE (DIAMOX) 125 MG tablet Take 2.5 tablets (312.5 mg total) by mouth 3 (three) times daily. 06/12/15   Mady Gemma, PA-C  lisinopril (PRINIVIL,ZESTRIL) 10 MG tablet Take 1 tablet (10 mg total) by mouth daily. 06/12/15   Mady Gemma, PA-C   BP 129/94 mmHg  Pulse 103  Temp(Src) 98.3 F (36.8 C) (Oral)  Resp 20  Ht  (1.727 m)  Wt 181.439 kg  BMI 60.83 kg/m2  SpO2 99%  LMP 06/21/2015 Physical Exam  Constitutional: She is oriented to person, place, and time. She appears well-developed and well-nourished.  HENT:  Head: Normocephalic and atraumatic.  Mouth/Throat: Oropharynx is clear and moist.  Eyes: Conjunctivae and EOM are normal. Pupils are equal, round, and reactive to light.  No TTP of maxillary or frontal sinuses  No TTP or induration of temporal arteries bilaterally  Neck: Normal range of motion. Neck supple.  FROM to C-spine. Pt can touch chin to chest without discomfort. No TTP of midline cervical spine.   Cardiovascular: Normal rate, regular rhythm and intact distal pulses.   Pulmonary/Chest: Effort normal and breath sounds normal. No respiratory distress. She has no wheezes. She has no rales. She exhibits no tenderness.  Abdominal: Soft. Bowel sounds are normal. There is no tenderness.  Musculoskeletal: Normal range of motion. She exhibits no edema  or tenderness.  No tenderness to percussion over thoracic or lumbar spinal processes.  Neurological: She is alert and oriented to person, place, and time. No cranial nerve deficit.  II-Visual fields grossly intact. III/IV/VI-Extraocular movements intact.  Pupils reactive bilaterally. V/VII-Smile symmetric, equal eyebrow raise,  facial sensation intact VIII- Hearing grossly intact IX/X-Normal gag XI-bilateral shoulder shrug XII-midline tongue extension Motor: 5/5 bilaterally with normal tone and bulk Cerebellar:  Normal finger-to-nose  and normal heel-to-shin test.   Romberg negative Ambulates with a coordinated gait   Skin:  No overlying cellulitis at LP site  Nursing note and vitals reviewed.   ED Course  Procedures (including critical care time) Labs Review Labs Reviewed - No data to display  Imaging Review No results found. I have personally reviewed and evaluated these images and lab results as part of my medical decision-making.   EKG Interpretation None      MDM   Final diagnoses:  Post-lumbar puncture headache    Filed Vitals:   07/13/15 1944 07/13/15 2100 07/13/15 2103 07/13/15 2216  BP: 129/94 143/73 143/73 128/90  Pulse:  106 106 114  Temp:      TempSrc:      Resp:   18 18  Height:      Weight:      SpO2:  96% 96% 97%    Medications  lisinopril (PRINIVIL,ZESTRIL) tablet 10 mg (10 mg Oral Given 07/13/15 2052)  acetaZOLAMIDE (DIAMOX) tablet 375 mg (375 mg Oral Given 07/13/15 2139)  sodium chloride 0.9 % bolus 1,000 mL (0 mLs Intravenous Stopped 07/13/15 2232)  prochlorperazine (COMPAZINE) injection 10 mg (10 mg Intravenous Given 07/13/15 2044)  magnesium sulfate IVPB 2 g 50 mL (0 g Intravenous Stopped 07/13/15 2232)  methylPREDNISolone sodium succinate (SOLU-MEDROL) 125 mg/2 mL injection 125 mg (125 mg Intravenous Given 07/13/15 2044)  morphine 4 MG/ML injection 4 mg (4 mg Intravenous Given 07/13/15 2052)    Cassandra Tucker is 25 y.o. female presenting with headache post therapeutic LP secondary to pseudotumor cerebri 3 days ago. Pain is consistent with post LP headache, exacerbated by standing up. Patient is tachycardic and blood pressure is elevated however she's been out of her medications for 2 days. Neuro exam is nonfocal, she is afebrile. I don't think this there is an infectious process.  Neurology consult from Dr. Pearlean Brownie appreciated: He will let the patient's neurologist know about the post LP headache. He recommends that we discuss it with interventional  radiology to set up the blood patch.  Case discussed with interventional radiologist Dr. Deanne Coffer who has taken this patient's contact information and their office will call to set up an appointment for blood patch tomorrow.   Evaluation does not show pathology that would require ongoing emergent intervention or inpatient treatment. Pt is hemodynamically stable and mentating appropriately. Discussed findings and plan with patient/guardian, who agrees with care plan. All questions answered. Return precautions discussed and outpatient follow up given.   Discharge Medication List as of 07/13/2015 10:21 PM    START taking these medications   Details  butalbital-acetaminophen-caffeine (FIORICET) 50-325-40 MG tablet Take 1-2 tablets by mouth every 6 (six) hours as needed for headache., Starting 07/13/2015, Until Wed 07/12/16, Print         Cassandra Jenson, PA-C 07/13/15 1610  Glynn Octave, MD 07/14/15 9604

## 2015-07-13 NOTE — Discharge Instructions (Signed)
Try to lay supine is much as possible.  He will receive a phone call to schedule your blood patch.  Do not hesitate to return to the emergency room for any new, worsening or concerning symptoms.   Follow closely with your neurologist   Epidural Blood Patch for Spinal Headache An epidural blood patch is a procedure that is used to treat a headache that occurs when there is a leak of spinal fluid. This type of headache is called a spinal headache or post-dural puncture headache. Spinal headaches sometimes occur after a person undergoes a type of anesthesia called epidural anesthesia or after a lumbar puncture (also called a spinal tap).  Generally, an epidural blood patch is done when a spinal headache has not been relieved by 2-3 days of:   Bed rest.   Drinking lots of fluids.   Taking oral medicines for pain, including nonsteroidal anti-inflammatory agents and caffeine. It may also be done to treat a person who has had epidural anesthesia and is experiencing:   Neck pain and stiffness that are very severe and associated with vomiting.   Hearing loss.   Double vision.  An epidural blood patch is not done when:   Your headache is due to an infection in the lower back (lumbar) area or the blood.   You have bleeding tendencies.   You are taking certain blood-thinning medicines. LET [facility] CARE PROVIDER KNOW ABOUT:  Any allergies you have.   All medicines you are taking, including vitamins, herbs, eye drops, creams, and over-the-counter medicines.   Previous problems you or members of your family have had with the use of anesthetics.   Any blood disorders you have.   Previous surgeries you have had.   Medical conditions you have.  RISKS AND COMPLICATIONS Generally, an epidural blood patch is a safe procedure. However, as with any procedure, complications can occur. Possible complications include:   Backache.  Nerve pain, tingling, or  numbness.  Bleeding.  Infection. Complications are more likely to occur in people who have bleeding disorders or infections. BEFORE THE PROCEDURE  Drink a lot of water the day before your procedure.  Make sure your health care provider knows about all medicines and dietary or herbal supplements that you are taking. Take them as directed and find out if you need to stop any of them prior to the procedure.  Follow your health care provider's instructions for when to stop eating and drinking.  Arrange for someone to drive you to and from the procedure. PROCEDURE   You will have two IV lines placed--one to give you fluids and medicines during the procedure and one to withdraw blood for the patch.  You will lie on your stomach.  An X-ray machine will take pictures of your back to locate the area of leakage.  Dye may be injected so that the area can be seen well on an X-ray.  Blood will be drawn from your arm and injected into the leaking area. When the blood is injected, you may feel tightness in your buttocks, lower back, or thighs. AFTER THE PROCEDURE  You will be expected to lie on your back for 2-4 hours with some pillows under your knees. It is important to lie still while on your back so that a good clot can form. You should also avoid any straining, especially right after the procedure.  Most people obtain almost instant relief from the spinal headache. In some, the relief comes on gradually over a 24-hour  period. Some people experience mild backaches for a few days. In a few cases, people also have a mild, passing sensation of prickly or tingly skin (paresthesia), neck pain, or nerve-root pain.   This information is not intended to replace advice given to you by your health care provider. Make sure you discuss any questions you have with your health care provider.   Document Released: 11/18/2001 Document Revised: 03/19/2013 Document Reviewed: 01/08/2013 Elsevier Interactive  Patient Education [employer]

## 2015-07-13 NOTE — Progress Notes (Signed)
Patient listed as having BC[employer] insurance without a pcp.  Patient reports her pcp is located in [place] at the [facility] [phone].

## 2015-07-13 NOTE — Telephone Encounter (Signed)
Receive notes from Dr. Despina Arias notes on [place] desk.

## 2015-07-13 NOTE — ED Notes (Signed)
Pt had a spinal tap on Friday and has had a headache since then, she also complains of intermittent nausea, no vomiting, her blood pressure is still elevated

## 2015-07-13 NOTE — Telephone Encounter (Signed)
I returned called from [facility] at [phone] about this patient seen there with post LP headache. I advised her to drink lots of fluids, caffeine and stay in bed as much as she can. Likely need referral back to interventional radiology for blood patch tomorrow.

## 2015-07-14 ENCOUNTER — Telehealth: Payer: Self-pay | Admitting: Radiology

## 2015-07-14 NOTE — Addendum Note (Signed)
Addended by: Hillis Range on: 07/14/2015 09:05 AM   Modules accepted: Orders

## 2015-07-14 NOTE — Telephone Encounter (Signed)
Called and spoke to [place] at [place] [facility]. Placed order for blood patch.

## 2015-07-14 NOTE — Telephone Encounter (Signed)
Deb with [facility] is calling to get an order for a blood patch for the patient. The patient  was seen in the ER last night with an LP headache and was to have a blood patch scheduled for today. See notes below.  Please call and advise. Thank you.

## 2015-07-14 NOTE — Telephone Encounter (Signed)
Pt is at work and does not have a headache at this time. Will call us in the morning if needs blood patch and we well work her in after her appointment with Dr. Lucia Gaskins.

## 2015-07-14 NOTE — Telephone Encounter (Signed)
2nd call. 1 hour from first, no answer.

## 2015-07-15 ENCOUNTER — Encounter: Payer: Self-pay | Admitting: Neurology

## 2015-07-15 ENCOUNTER — Ambulatory Visit (INDEPENDENT_AMBULATORY_CARE_PROVIDER_SITE_OTHER): Payer: BLUE CROSS/BLUE SHIELD | Admitting: Neurology

## 2015-07-15 VITALS — BP 177/92 | HR 125 | Ht 68.0 in | Wt >= 6400 oz

## 2015-07-15 DIAGNOSIS — G932 Benign intracranial hypertension: Secondary | ICD-10-CM | POA: Diagnosis not present

## 2015-07-15 DIAGNOSIS — G4733 Obstructive sleep apnea (adult) (pediatric): Secondary | ICD-10-CM

## 2015-07-15 MED ORDER — ACETAZOLAMIDE ER 500 MG PO CP12: 500.0000 mg | ORAL_CAPSULE | Freq: Three times a day (TID) | ORAL | Status: DC

## 2015-07-15 MED ORDER — LISINOPRIL 20 MG PO TABS: 20.0000 mg | ORAL_TABLET | Freq: Every day | ORAL | Status: DC

## 2015-07-15 NOTE — Patient Instructions (Signed)
Remember to drink plenty of fluid, eat healthy meals and do not skip any meals. Try to eat protein with a every meal and eat a healthy snack such as fruit or nuts in between meals. Try to keep a regular sleep-wake schedule and try to exercise daily, particularly in the form of walking, 20-30 minutes a day, if you can.   As far as your medications are concerned, I would like to suggest Diamox  three times a day Lisinopril  daily Ophthalmology referral for evaluation and visual field testing. Dr. Sallye Lat [facility].   I would like to see you back in 2 months, sooner if we need to. Please call us with any interim questions, concerns, problems, updates or refill requests.    Our phone number is [phone]. We also have an after hours call service for urgent matters and there is a physician on-call for urgent questions. For any emergencies you know to call 911 or go to the nearest emergency room

## 2015-07-15 NOTE — Progress Notes (Signed)
[facility]    Provider:  Dr Lucia Gaskins Referring Provider: No ref. provider found Primary Care Physician:  PROVIDER NOT IN SYSTEM  Interval history: Patient denies any headaches. She had OSA in the past, last sleep eval 3 years ago. She has not used her cpap. Will refer to Dr. Vickey Huger for sleep study. LP OP was 32cm and MRi of the brain c/w IIH. She has not seen an Ophthalmologist will refer to Marchelle Gearing for eye eval and VF testing.   IMPRESSION: This MRI of the brain with and without contrast shows the following: 1. 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 Though nonspecific, this can be seen with idiopathic intracranial hypertension (pseudotumor cerebri). 2. There is chronic sphenoid, left maxillary, right frontal and ethmoid sinusitis. 3. 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 4. Brain parenchyma is otherwise normal 5. There are no acute findings.  LP:  With the patient prone, the lower back was prepped with Betadine. 1% Lidocaine was used for local anesthesia. Lumbar puncture was performed at the L3-4 level using a 22 gauge needle with return of clear colorless CSF with an opening pressure of 32 cm water. 27.5 ml of CSF were obtained for laboratory studies. The patient tolerated the procedure well and there were no apparent Complications.  reviewed images and test results with patient and her mother. Explained next steps.   HPI: Cassandra Tucker is a 25 y.o. 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 She has been having headaches for 8-9 days. She was diagnosed 3 years ago. She had lumbar puncture 3 years ago and it was 50. She stopped the medication about 2 years ago on her own because she was feeling fine. Hasn't had any problems until 2 weeks ago. She was having bad headaches, saw the doctor and said the swelling was back. The headaches are  pressure all over. They are continuous. She has headaches when she wakes up. She has untreated sleep apnea. She can't tolerate the mask. Er started the medication. Had MRi of the brain 3 years ago. Headaches can be 10/10 pain, she also has shooting pain. She feels pressure in the ears. She has had episodic blurry vision. She just started the medication prescribed in the ED. She was prescribed 125mg  pills and instructed to take 2.5 pills three times a day.   Reviewed notes, labs and imaging from outside physicians, which showed:  She was seen in the ED on 06/12/2015 for optic disk edema and headches. She was sent by her optometrist after eye exam. She reported headaches for a week with dizziness and lightheadedness. She denied fever, chills, chest pain, shortness of breath, abdominal pain, N/V/D/C, numbness, weakness, paresthesia. She stopped her diamox years ago. She also reported non compliance with lisinopril. She was started on Lisinopril and diamox and discharged with OP neurology follow up.  Review of Systems: Patient complains of symptoms per HPI as well as the following symptoms: No CP, No SOB. Pertinent negatives per HPI. All others negative.   Social History   Social History  . Marital Status: Single    Spouse Name: N/A  . Number of Children: 0  . Years of Education: 12+   Occupational History  . [employer]    Social History Main Topics  . Smoking status: Never Smoker   . Smokeless tobacco: Not on file  . Alcohol Use: 0.0 oz/week  0 Standard drinks or equivalent per week     Comment: socially  . Drug Use: No  . Sexual Activity: Not on file   Other Topics Concern  . Not on file   Social History Narrative   Lives with grandparents   Caffeine use: Drinks coffee/tea/soda- 20oz per day (none after starting diamox)    Family History  Problem Relation Age of Onset  . Migraines Neg Hx     Past Medical History  Diagnosis Date  . Arthritis     left wrist and  left ankle  . Bronchitis   . Hypertension 2013  . Ankle fracture, left 2000    Past Surgical History  Procedure Laterality Date  . Fracture surgery  2005  . Wrist surgery  2015    Current Outpatient Prescriptions  Medication Sig Dispense Refill  . ibuprofen (ADVIL,MOTRIN) 200 MG tablet Take 400 mg by mouth every 6 (six) hours as needed for headache or moderate pain.     Marland Kitchen lisinopril (PRINIVIL,ZESTRIL) 20 MG tablet Take 1 tablet (20 mg total) by mouth daily. 90 tablet 4  . acetaZOLAMIDE (DIAMOX) 500 MG capsule Take 1 capsule (500 mg total) by mouth 3 (three) times daily. 90 capsule 11   No current facility-administered medications for this visit.    Allergies as of 07/15/2015  . (No Known Allergies)    Vitals: BP 177/92 mmHg  Pulse 125  Ht  (1.727 m)  Wt 419 lb 12.8 oz (190.42 kg)  BMI 63.85 kg/m2  LMP 06/21/2015 Last Weight:  Wt Readings from Last 1 Encounters:  07/15/15 419 lb 12.8 oz (190.42 kg)   Last Height:   Ht Readings from Last 1 Encounters:  07/15/15  (1.727 m)    Speech:  Speech is normal; fluent and spontaneous with normal comprehension.  Cognition:  The patient is oriented to person, place, and time;   recent and remote memory intact;   language fluent;   normal attention, concentration,   fund of knowledge Cranial Nerves: papilledema R>L  The pupils are equal, round, and reactive to light. Visual fields are full to finger confrontation. Extraocular movements are intact. Trigeminal sensation is intact and the muscles of mastication are normal. The face is symmetric. The palate elevates in the midline. Hearing intact. Voice is normal. Shoulder shrug is normal. The tongue has normal motion without fasciculations.   Coordination:  No dysmetria  Gait:  Wide-based due to large body habitus  Motor Observation:  No asymmetry, no atrophy, and no involuntary movements noted. Tone:  Normal muscle tone.    Posture:  Posture is normal. normal erect   Strength:  Strength is V/V in the upper and lower limbs.    Sensation: intact to LT   Reflex Exam:  DTR's:  Deep tendon reflexes in the upper and lower extremities are normal bilaterally.  Toes:  The toes are downgoing bilaterally.  Clonus:  Clonus is absent.      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  MRI c/w IIH, opening pressure 32cm. Continue Diamox  tid Discussed weight loss, eating healthy, possibly bariatric surgery.  Discussed that untreated IIH can cause permanent vision loss, stressed medication compliance and weight loss, if vision worsens call office or proceed to ED. Will refer to ophthalmology, has been to optometry in the past last year but not ophthalmology.   Naomie Dean, MD  [facility] [address] [place], [place]  [address]  Phone [phone] Fax [phone]  A total of 40 minutes was spent face-to-face with this patient. Over half this time was spent on counseling patient on the IIH, morbid obesity diagnosis and different diagnostic and therapeutic options available.

## 2015-07-16 ENCOUNTER — Encounter: Payer: Self-pay | Admitting: *Deleted

## 2015-07-16 DIAGNOSIS — Z0289 Encounter for other administrative examinations: Secondary | ICD-10-CM

## 2015-07-16 NOTE — Progress Notes (Signed)
Sent completed FMLA paperwork to [employer] on 07/16/15.  

## 2015-07-19 ENCOUNTER — Telehealth: Payer: Self-pay | Admitting: *Deleted

## 2015-07-19 NOTE — Telephone Encounter (Signed)
Patient form at the front desk ready for pick up.

## 2015-08-04 ENCOUNTER — Telehealth: Payer: Self-pay | Admitting: Neurology

## 2015-08-04 NOTE — Telephone Encounter (Signed)
Called pt back. Clarified what was incomplete. Advised she has episodic episodes and does not need part-time or reduced work schedule. She is going to call and speak to work about it. She is also going to fax signature page to me at [phone] to have Dr Lucia Gaskins sign. I will re-fax to [phone]. Pt aware Dr Lucia Gaskins out of office until Monday.

## 2015-08-04 NOTE — Telephone Encounter (Signed)
Patient is calling to discuss FMLA paperwork that was filled out and she says some items were incomplete. I told the patient that Dr. Lucia Gaskins is out of the office until Monday but she wanted to speak to the nurse.

## 2015-08-05 NOTE — Telephone Encounter (Signed)
Pt is calling back regarding FMLA forms. She did speak with FMLA people and needs to discuss with you. If you do not get her she says she will call you back when she gets her break.

## 2015-08-05 NOTE — Telephone Encounter (Signed)
Pt called office back. She advised number 6 on FMLA needed to all say no since she has episodic flare ups.  I advised I will make changes and refax. She is going to have supervisor fax signature page to me tomorrow. I told her I will call around 12pm if I don't see it by then. She verbalized understanding.

## 2015-08-05 NOTE — Telephone Encounter (Signed)
Returned call. LVM for pt to call back. Gave [employer] phone number.

## 2015-08-06 NOTE — Telephone Encounter (Signed)
Called pt again. LVM and advised I have not received a fax yet from her supervisor. Gave fax number [phone] again. Asked her to call Monday if needed. Office now closed.

## 2015-08-09 NOTE — Telephone Encounter (Signed)
LVM letting pt know I received fax from employer about paperwork. I will fix number 6. Advised they do have the signature page. I will re-send with corrections. Gave [employer] phone number if she has further questions.

## 2015-08-09 NOTE — Telephone Encounter (Signed)
Re-faxed revised FMLA form. Received confirmation.

## 2015-08-12 ENCOUNTER — Ambulatory Visit (INDEPENDENT_AMBULATORY_CARE_PROVIDER_SITE_OTHER): Payer: BLUE CROSS/BLUE SHIELD | Admitting: Neurology

## 2015-08-12 ENCOUNTER — Encounter: Payer: Self-pay | Admitting: Neurology

## 2015-08-12 VITALS — BP 152/90 | HR 98 | Resp 20 | Ht 67.0 in | Wt >= 6400 oz

## 2015-08-12 DIAGNOSIS — Z789 Other specified health status: Secondary | ICD-10-CM

## 2015-08-12 DIAGNOSIS — R351 Nocturia: Secondary | ICD-10-CM

## 2015-08-12 DIAGNOSIS — G932 Benign intracranial hypertension: Secondary | ICD-10-CM

## 2015-08-12 DIAGNOSIS — E669 Obesity, unspecified: Secondary | ICD-10-CM | POA: Diagnosis not present

## 2015-08-12 DIAGNOSIS — R519 Headache, unspecified: Secondary | ICD-10-CM

## 2015-08-12 DIAGNOSIS — R51 Headache: Secondary | ICD-10-CM | POA: Diagnosis not present

## 2015-08-12 DIAGNOSIS — E662 Morbid (severe) obesity with alveolar hypoventilation: Secondary | ICD-10-CM | POA: Diagnosis not present

## 2015-08-12 DIAGNOSIS — R0683 Snoring: Secondary | ICD-10-CM | POA: Diagnosis not present

## 2015-08-12 NOTE — Progress Notes (Signed)
[facility]   SLEEP MEDICINE CLINIC   Provider:  Melvyn Novas, M D  Referring Provider: No ref. provider found Primary Care Physician:  PROVIDER NOT IN SYSTEM  Chief Complaint  Patient presents with  . New Patient (Initial Visit)    had sleep study over three years ago, was put on cpap , doesn't use it, rm 10    HPI:  Cassandra Tucker is a 25 y.o. female , seen here as a referral  from Dr. Kerrie Buffalo, and ED.  The sleep consultation was requested by Dr. Naomie Dean primary neurologist for this patient.  Chief complaint according to patient : "pseudotumour and obesity"  Mrs. Inda Merlin is seen here today as a new patient to myself after being diagnosed with bilateral properly edema related to pseudotumor cerebri. She was evaluated about 4 years ago at the time she was first diagnosed with pseudotumor. The MRI of the brain that Dr. Lucia Gaskins ordered showed an empty sella and widened optic nerve sheath which both can be seen in pseudotumor cerebri. She had chronic sphenoid and left sided maxillary areas sinusitis and right frontal and ethmoid sinusitis. She also has a right cerebellar hemisphere developmental venous abnormality and no acute findings. A lumbar puncture was performed an opening pressure of 32 cm water was seen. 27.5 mL CSF were obtained for lab studies. The patient had the last lumbar puncture over 3-1/2 years ago and at that time she had 50 cm water pressure. She's placed on Diamox and continues to take Diamox. She was having bad headaches before returning to this practice and her ophthalmologist noted swelling. Her headaches can reach 10 out of 10 in pain intensity sometimes there is a shooting character to the pain is sharp stabbing sensation through the eye. She also has pressure feeling in the ears. She was evaluated in [place] for obstructive sleep apnea given her high BMI and the pseudotumor history. She states that the sleep study revealed apnea and  she was placed on a CPAP machine. She describes to me a very large device in light gray and this may have been a BiPAP machine. She still has his machine at her home. I cannot review today but she acknowledges she has not used it in years.  Sleep habits are as follows: The patient usually goes to bed around 10 to 10:30 PM and falls asleep promptly. The bedroom is described as core, quiet and dark she usually sleeps alone. She prefers prone sleep and left sided lateral position and sleeps on 2 pillows. She has 3-4 bathroom breaks at night, usually going back to sleep easily. She rises usually at 5:30 AM allowing her a total sleep time of just over 6 hours. She wakes up spontaneously does not rely on an alarm. Most of the mornings she feels ready to go. Recently she had no headaches in the morning. She continues to take Diamox 500 mg thrice a day, the headaches that used to be there when she wakes up have been reduced. Since she had her last lumbar puncture she has not experienced cluster headaches. Her sharp stabbing sensation that woke her out of sleep and usually felt as if the penetrate the eye.  Sleep medical history and family sleep history:  Mother with OSA, CPAP.   Social history:  Non smoker, rare  ETOH  ( 2 drinks a month ), caffeine use - 3-5 glasses  iced sweet tea per week., no soda and no cafe.   Review of Systems:  Out of a complete 14 system review, the patient complains of only the following symptoms, and all other reviewed systems are negative.  She does not nap in daytime, she endorsed the Epworth sleepiness score at 7 points and the fatigue severity score at 38 points. She does not feel depressed she endorsed death interest in some activities and the level of low energy some anxiety, she knows that she is snoring at night and has been told so and some nights she has restless legs before going to sleep prolonged sleep latency. 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      Social History   Social History  . Marital Status: Single    Spouse Name: N/A  . Number of Children: 0  . Years of Education: 12+   Occupational History  . [employer]    Social History Main Topics  . Smoking status: Never Smoker   . Smokeless tobacco: Not on file  . Alcohol Use: 0.0 oz/week    0 Standard drinks or equivalent per week     Comment: socially  . Drug Use: No  . Sexual Activity: Not on file   Other Topics Concern  . Not on file   Social History Narrative   Lives with grandparents   Caffeine use: Drinks coffee/tea/soda- 20oz per day (none after starting diamox)    Family History  Problem Relation Age of Onset  . Migraines Neg Hx     Past Medical History  Diagnosis Date  . Arthritis     left wrist and left ankle  . Bronchitis   . Hypertension 2013  . Ankle fracture, left 2000    Past Surgical History  Procedure Laterality Date  . Fracture surgery  2005  . Wrist surgery  2015    Current Outpatient Prescriptions  Medication Sig Dispense Refill  . acetaZOLAMIDE (DIAMOX) 500 MG capsule Take 1 capsule (500 mg total) by mouth 3 (three) times daily. 90 capsule 11  . ibuprofen (ADVIL,MOTRIN) 200 MG tablet Take 400 mg by mouth every 6 (six) hours as needed for headache or moderate pain.     Marland Kitchen lisinopril (PRINIVIL,ZESTRIL) 20 MG tablet Take 1 tablet (20 mg total) by mouth daily. 90 tablet 4   No current facility-administered medications for this visit.    Allergies as of 08/12/2015  . (No Known Allergies)    Vitals: BP 152/90 mmHg  Pulse 98  Resp 20  Ht  (1.702 m)  Wt 405 lb (183.707 kg)  BMI 63.42 kg/m2 Last Weight:  Wt Readings from Last 1 Encounters:  08/12/15 405 lb (183.707 kg)   ZOX:WRUE mass index is 63.42 kg/(m^2).     Last Height:   Ht Readings from Last 1 Encounters:  08/12/15  (1.702 m)     Physical exam:  General: The patient is awake, alert and appears not in acute distress. The patient is well groomed. Head: Normocephalic, atraumatic. Neck is supple. Mallampati 4, no tonsilectomy or adenoid ectomy.  Pierced tongue.  neck circumference: 20 . Nasal airflow restricted , TMJ - normal  . Retrognathia is seen.  Cardiovascular:  Regular rate and rhythm , without  murmurs or carotid bruit, and without distended neck veins. Respiratory: Lungs are clear to auscultation. Skin:  Without evidence of edema, or rash Trunk: BMI is super-obese.  Neurologic exam : The patient  is awake and alert, oriented to place and time.   Attention span & concentration ability appears normal.  Speech is fluent,  without dysarthria, dysphonia or aphasia.  Mood and affect are appropriate. Cranial nerves: Pupils are equal and briskly reactive to light. Funduscopic exam with evidence of pallor / edema. Extraocular movements  in vertical and horizontal planes intact and without nystagmus. Visual fields by finger perimetry are intact. Hearing to finger rub intact.  Facial sensation intact to fine touch.Facial motor strength is symmetric and tongue and uvula move midline. Shoulder shrug was symmetrical. Normal tone, muscle bulk and symmetric strength in all extremities. Sensory:  Fine touch, pinprick and vibration were  normal. Coordination: Rapid alternating movements/. Finger-to-nose maneuver normal without evidence of ataxia, dysmetria or tremor. Gait and station: Patient walks without assistive device and is able unassisted to climb up to the exam table. Strength within normal limits.Stance is stable and normal.  Deep tendon reflexes: in the  upper and lower extremities are symmetric and intact. Babinski maneuver response is attenuated. The patient is constantly moving her feet and legs.   The patient was advised of the nature of the diagnosed sleep disorder , the treatment options and risks for general a  health and wellness arising from not treating the condition.  I spent more than 45 minutes of face to face time with the patient. Greater than 50% of time was spent in counseling and coordination of care. We have discussed the diagnosis and differential and I answered the patient's questions.      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  A total of 40 minutes was spent face-to-face with this patient. Over half this time was spent on counseling patient on the IIH, morbid obesity diagnosis and different diagnostic and therapeutic options available.   Sleep apnea evaluation is necessary due to morbid obesity and previously diagnosed sleep apnea. Her test 5 years ago was in [place], at [facility] laboratory.  0) nocturia.  1) non restorative sleep, fragmented, dry moth. Wakes with headaches. CO2. Hypoxemia screenig treat with CPAP or BiPAP and consider additional oxygen.  2) pseudotumour cerebri MRI c/w IIH, opening pressure 32cm. Continue Diamox 500mg  tid, get new SPLIT study , evaluate for hypercapnia. CO2 retention will promote  Pseudotumour. Marland Kitchen   3) Discussed weight loss, eating healthy, possibly bariatric surgery.   4) Discussed that untreated IIH can cause permanent vision loss, stressed medication compliance and weight loss, if vision worsens call office or proceed to ED. 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 -pending consultation with  ophthalmology, has been to optometry in the past last year but not ophthalmology.       Porfirio Mylar Samar Venneman MD  08/12/2015     CC Dr Lucia Gaskins  CC Dr Kerrie Buffalo, Optometrist,   Dr Dione Booze.

## 2015-08-12 NOTE — Patient Instructions (Signed)

## 2015-08-27 LAB — HM DIABETES EYE EXAM

## 2015-09-13 ENCOUNTER — Ambulatory Visit: Payer: BLUE CROSS/BLUE SHIELD | Admitting: Neurology

## 2015-09-13 ENCOUNTER — Telehealth: Payer: Self-pay | Admitting: *Deleted

## 2015-09-13 NOTE — Telephone Encounter (Signed)
no showed f/u 

## 2015-09-14 ENCOUNTER — Encounter: Payer: Self-pay | Admitting: Neurology

## 2015-09-24 ENCOUNTER — Ambulatory Visit (INDEPENDENT_AMBULATORY_CARE_PROVIDER_SITE_OTHER): Payer: BLUE CROSS/BLUE SHIELD | Admitting: Neurology

## 2015-09-24 DIAGNOSIS — E662 Morbid (severe) obesity with alveolar hypoventilation: Secondary | ICD-10-CM

## 2015-09-24 DIAGNOSIS — Z789 Other specified health status: Secondary | ICD-10-CM

## 2015-09-24 DIAGNOSIS — R0683 Snoring: Secondary | ICD-10-CM

## 2015-09-24 DIAGNOSIS — G4733 Obstructive sleep apnea (adult) (pediatric): Secondary | ICD-10-CM | POA: Diagnosis not present

## 2015-09-24 DIAGNOSIS — E669 Obesity, unspecified: Secondary | ICD-10-CM

## 2015-09-24 DIAGNOSIS — R519 Headache, unspecified: Secondary | ICD-10-CM

## 2015-09-24 DIAGNOSIS — R351 Nocturia: Secondary | ICD-10-CM

## 2015-09-24 DIAGNOSIS — R51 Headache: Secondary | ICD-10-CM

## 2015-09-24 DIAGNOSIS — G932 Benign intracranial hypertension: Secondary | ICD-10-CM

## 2015-09-25 NOTE — Sleep Study (Signed)
Please see the scanned sleep study interpretation located in the procedure tab within the chart review section.   

## 2015-09-29 ENCOUNTER — Telehealth: Payer: Self-pay

## 2015-09-29 DIAGNOSIS — G4733 Obstructive sleep apnea (adult) (pediatric): Secondary | ICD-10-CM

## 2015-09-29 NOTE — Telephone Encounter (Signed)
Called pt to discuss sleep study results. No answer, left a message asking her to call me back. 

## 2015-09-29 NOTE — Telephone Encounter (Signed)
Spoke to pt regarding her sleep study results. I advised pt that her sleep study revealed severest osa, associated with hypercapnia and hypoxia. CPAP initiated during her study appeared to be effective and Dr. Vickey Hugerohmeier recommends starting a CPAP. Pt is agreeable to starting a CPAP. I advised pt that I would send an order for a CPAP to a DME and the DME company will call her to discuss setting up the CPAP. Pt verbalized understanding. I advised pt to avoid sedative-hypnotics which may worsen sleep apnea, alcohol and tobacco. Pt verbalized understanding. Pt was advised to not drive or operate hazardous machinery when sleepy. I advised pt to lose weight, diet, and exercise if not contraindicated by her other physicians. Pt verbalized understanding. A follow up appt was made with pt for 11/29/15 at 3:30. Pt verbalized understanding.

## 2015-10-18 ENCOUNTER — Encounter: Payer: Self-pay | Admitting: *Deleted

## 2015-11-22 ENCOUNTER — Ambulatory Visit: Payer: BLUE CROSS/BLUE SHIELD | Admitting: Neurology

## 2015-11-29 ENCOUNTER — Encounter: Payer: Self-pay | Admitting: Neurology

## 2015-11-29 ENCOUNTER — Ambulatory Visit (INDEPENDENT_AMBULATORY_CARE_PROVIDER_SITE_OTHER): Payer: BLUE CROSS/BLUE SHIELD | Admitting: Neurology

## 2015-11-29 VITALS — BP 164/92 | HR 98 | Resp 20 | Ht 66.0 in | Wt >= 6400 oz

## 2015-11-29 DIAGNOSIS — G4733 Obstructive sleep apnea (adult) (pediatric): Secondary | ICD-10-CM

## 2015-11-29 DIAGNOSIS — Z9989 Dependence on other enabling machines and devices: Secondary | ICD-10-CM

## 2015-11-29 DIAGNOSIS — E662 Morbid (severe) obesity with alveolar hypoventilation: Secondary | ICD-10-CM | POA: Diagnosis not present

## 2015-11-29 DIAGNOSIS — E669 Obesity, unspecified: Secondary | ICD-10-CM | POA: Diagnosis not present

## 2015-11-29 DIAGNOSIS — G2581 Restless legs syndrome: Secondary | ICD-10-CM

## 2015-11-29 DIAGNOSIS — G932 Benign intracranial hypertension: Secondary | ICD-10-CM | POA: Diagnosis not present

## 2015-11-29 MED ORDER — ROPINIROLE HCL 0.25 MG PO TABS: 0.2500 mg | ORAL_TABLET | Freq: Three times a day (TID) | ORAL | Status: DC

## 2015-11-29 NOTE — Addendum Note (Signed)
Addended by: Melvyn NovasHMEIER, Guyla Bless on: 11/29/2015 03:54 PM   Modules accepted: Orders

## 2015-11-29 NOTE — Patient Instructions (Signed)
Obesity hypoventilation, severe OSA -Apnea with hypercapnia , pseudotumour cerebri.

## 2015-11-29 NOTE — Progress Notes (Signed)
[facility]   SLEEP MEDICINE CLINIC   Provider:  Melvyn Novasarmen  Hjalmar Ballengee, M D  Referring Provider: No ref. provider found Primary Care Physician:  PROVIDER NOT IN SYSTEM  Chief Complaint  Patient presents with  . Follow-up    cpap going well, no problems    HPI:  Cassandra Tucker is a 25 y.o. female , seen here as a referral  from Dr. Kerrie BuffaloWestfall, and ED.  The sleep consultation was requested by Dr. Naomie DeanAntonia Ahern primary neurologist for this patient.  Chief complaint according to patient : "pseudotumour and obesity"  Cassandra Tucker is seen here today as a new patient to myself after being diagnosed with bilateral properly edema related to pseudotumor cerebri. She was evaluated about 4 years ago at the time she was first diagnosed with pseudotumor. The MRI of the brain that Dr. Lucia GaskinsAhern ordered showed an empty sella and widened optic nerve sheath which both can be seen in pseudotumor cerebri. She had chronic sphenoid and left sided maxillary areas sinusitis and right frontal and ethmoid sinusitis. She also has a right cerebellar hemisphere developmental venous abnormality and no acute findings. A lumbar puncture was performed an opening pressure of 32 cm water was seen. 27.5 mL CSF were obtained for lab studies. The patient had the last lumbar puncture over 3-1/2 years ago and at that time she had 50 cm water pressure. She's placed on Diamox and continues to take Diamox. She was having bad headaches before returning to this practice and her ophthalmologist noted swelling. Her headaches can reach 10 out of 10 in pain intensity sometimes there is a shooting character to the pain is sharp stabbing sensation through the eye. She also has pressure feeling in the ears. [place] given her high BMI and the pseudotumor history. She states that the sleep study revealed apnea and she was placed on a CPAP machine. She describes to me a very large  device in light gray and this may have been a BiPAP machine. She still has his machine at her home. I cannot review today but she acknowledges she has not used it in years.  Sleep habits are as follows: The patient usually goes to bed around 10 to 10:30 PM and falls asleep promptly. The bedroom is described as core, quiet and dark she usually sleeps alone. She prefers prone sleep and left sided lateral position and sleeps on 2 pillows. She has 3-4 bathroom breaks at night, usually going back to sleep easily. She rises usually at 5:30 AM allowing her a total sleep time of just over 6 hours. She wakes up spontaneously does not rely on an alarm. Most of the mornings she feels ready to go. Recently she had no headaches in the morning. She continues to take Diamox 500 mg thrice a day, the headaches that used to be there when she wakes up have been reduced. Since she had her last lumbar puncture she has not experienced cluster headaches. Her sharp stabbing sensation that woke her out of sleep and usually felt as if the penetrate the eye.    Interval history from 11/29/2015. Cassandra Tucker underwent a split-night polysomnography on 09/24/2015. She has an extremely high AHI of 120. On average she suffered a respiratory event every 30 seconds of sleep. There was an marginal supine accentuation and no REM sleep had been seen during her diagnostic part of the study. Oxygen nadir was 79% is 30.6 minutes of desaturation time. CO2  was retained to a level of 51.6 torr. Heart rate was tachycardic in sinus rhythm. The patient was titrated to 10 cm water CPAP which reduced her AHI to 2.2 I ordered also an air-fluid nasal pillow for her to so-called P10. She reports today that she is able to use the CPAP machine with the current interface. And she presents with a very good compliance record. She has used the machine 93% of all days and 87% of the time over 4 hours of nightly use. Average user time is 4 hours 32 minutes the  pressure is set at 10 cm water was 1 cm EPR and the residual AHI is 0.0. She also reports that after she underwent a spinal tap for the evaluation of pseudotumor cerebri every she no longer had headaches and no recurrent headache.  Sleep medical history and family sleep history:  Mother with OSA, CPAP.  Social history:  Non smoker, rare  ETOH  ( 2 drinks a month ), caffeine use - 3-5 glasses  iced sweet tea per week., no soda and no cafe.   Review of Systems: Out of a complete 14 system review, the patient complains of only the following symptoms, and all other reviewed systems are negative.  She does not nap in daytime, she endorsed the Epworth sleepiness score at 6 points and the fatigue severity score at 39 points. She does not feel depressed she endorsed loss of  interest in some activities and the level of low energy some anxiety,  she knows that she is snoring at night and has been told so and some nights she has restless legs before going to sleep prolonged sleep latency. 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      Social History   Social History  . Marital Status: Single    Spouse Name: N/A  . Number of Children: 0  . Years of Education: 12+   Occupational History  . [employer]    Social History Main Topics  . Smoking status: Never Smoker   . Smokeless tobacco: Not on file  . Alcohol Use: 0.0 oz/week    0 Standard drinks or equivalent per week     Comment: socially  . Drug Use: No  . Sexual Activity: Not on file   Other Topics Concern  . Not on file   Social History Narrative   Lives with grandparents   Caffeine use: Drinks coffee/tea/soda- 20oz per day (none after starting diamox)    Family History  Problem Relation Age of Onset  . Migraines Neg Hx     Past Medical History  Diagnosis Date  . Arthritis     left wrist and left ankle  . Bronchitis   . Hypertension 2013   . Ankle fracture, left 2000    Past Surgical History  Procedure Laterality Date  . Fracture surgery  2005  . Wrist surgery  2015    Current Outpatient Prescriptions  Medication Sig Dispense Refill  . acetaZOLAMIDE (DIAMOX) 500 MG capsule Take 1 capsule (500 mg total) by mouth 3 (three) times daily. 90 capsule 11  . ibuprofen (ADVIL,MOTRIN) 200 MG tablet Take 400 mg by mouth every 6 (six) hours as needed for headache or moderate pain.     Marland Kitchen lisinopril (PRINIVIL,ZESTRIL) 20 MG tablet Take 1 tablet (20 mg total) by mouth daily. 90 tablet 4   No current facility-administered medications  for this visit.    Allergies as of 11/29/2015  . (No Known Allergies)    Vitals: BP 164/92 mmHg  Pulse 98  Resp 20  Ht 5\' 6"  (1.676 m)  Wt 412 lb (186.882 kg)  BMI 66.53 kg/m2 Last Weight:  Wt Readings from Last 1 Encounters:  11/29/15 412 lb (186.882 kg)   ZOX:WRUE mass index is 66.53 kg/(m^2).     Last Height:   Ht Readings from Last 1 Encounters:  11/29/15 5\' 6"  (1.676 m)    Physical exam:  General: The patient is awake, alert and appears not in acute distress. The patient is well groomed. Head: Normocephalic, atraumatic. Neck is supple. Mallampati 4, no tonsilectomy or adenoid ectomy.  Pierced tongue.  neck circumference: 20 . Nasal airflow restricted , TMJ - normal  . Retrognathia is seen.  Cardiovascular:  Regular rate and rhythm , without  murmurs or carotid bruit, and without distended neck veins. Respiratory: Lungs are clear to auscultation. Skin:  Without evidence of edema, or rash Trunk: BMI is super-obese.  Neurologic exam : The patient is awake and alert, oriented to place and time.   Attention span & concentration ability appears normal.  Speech is fluent,  without dysarthria, dysphonia or aphasia.  Mood and affect are appropriate. Cranial nerves: Pupils are equal and briskly reactive to light. Funduscopic exam with evidence of pallor / edema. Extraocular movements  in  vertical and horizontal planes intact and without nystagmus. Visual fields by finger perimetry are intact. Hearing to finger rub intact.  Facial sensation intact to fine touch.Facial motor strength is symmetric and tongue and uvula move midline. Shoulder shrug was symmetrical. Normal tone, muscle bulk and symmetric strength in all extremities. Sensory:  Fine touch, pinprick and vibration were  normal. Coordination: Rapid alternating movements/. Finger-to-nose maneuver normal without evidence of ataxia, dysmetria or tremor. Gait and station: Patient walks without assistive device and is able unassisted to climb up to the exam table. Strength within normal limits.Stance is stable and normal.  Deep tendon reflexes: in the  upper and lower extremities are symmetric and intact. Babinski maneuver response is attenuated. The patient is constantly moving her feet and legs.   The patient was advised of the nature of the diagnosed sleep disorder , the treatment options and risks for general a health and wellness arising from not treating the condition.  I spent more than 45 minutes of face to face time with the patient. Greater than 50% of time was spent in counseling and coordination of care. We have discussed the diagnosis and differential and I answered the patient's questions.      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  A total of 40 minutes was spent face-to-face with this patient. Over half this time was spent on counseling patient on the IIH, morbid obesity diagnosis and different diagnostic and therapeutic options available.   Sleep apnea evaluation is necessary due  to morbid obesity and previously diagnosed sleep apnea. Her test 5 years ago was in [place], at [facility] laboratory.  0) nocturia.  1)  Severest Sleep apnea, AHI 120. Non restorative sleep, fragmented, dry moth. Wakes with headaches. CO2 54 torr and Hypoxemia with  hypercapnia. CO2 retention will promote pseudotumour. treat with CPAP and consider additional oxygen of not sucessfully.  2) pseudotumour cerebri MRI c/w IIH, opening pressure 32cm. Continue Diamox 500mg  tid,   I would certainly want for Cassandra Tucker to continue using CPAP given the very high degree of apnea. CPAP also can help to overcome the obesity hypoventilation component which could not be treated with a dental device or ENT surgery. Cassandra Tucker unfortunately lost her mother just recently, at age 64. She just edited to make this morning, that is meant to memorialize her mother, who love turtles. I discussed weight loss surgery, but she is now not ready for it. I like for her to use the machine for a more extended period of time.   Porfirio Mylar Maurica Omura MD  11/29/2015     CC Dr Lucia Gaskins, [facility] Maxim.   CC Dr Kerrie Buffalo, Optometrist,   Dr Dione Booze.

## 2015-12-08 ENCOUNTER — Encounter: Payer: Self-pay | Admitting: Physician Assistant

## 2015-12-08 ENCOUNTER — Ambulatory Visit (INDEPENDENT_AMBULATORY_CARE_PROVIDER_SITE_OTHER): Payer: BLUE CROSS/BLUE SHIELD | Admitting: Physician Assistant

## 2015-12-08 VITALS — BP 140/96 | HR 109 | Temp 98.1°F | Ht 66.0 in | Wt >= 6400 oz

## 2015-12-08 DIAGNOSIS — J029 Acute pharyngitis, unspecified: Secondary | ICD-10-CM | POA: Diagnosis not present

## 2015-12-08 LAB — CULTURE, GROUP A STREP

## 2015-12-08 LAB — RAPID STREP SCREEN (MED CTR MEBANE ONLY): Strep Gp A Ag, IA W/Reflex: NEGATIVE

## 2015-12-08 MED ORDER — AMOXICILLIN 875 MG PO TABS: 875.0000 mg | ORAL_TABLET | Freq: Two times a day (BID) | ORAL | Status: DC

## 2015-12-08 NOTE — Patient Instructions (Signed)

## 2015-12-08 NOTE — Progress Notes (Signed)
Subjective:     Patient ID: Cassandra Tucker, female   DOB: 01-02-1991, 25 y.o.   MRN: [id number]  HPI Pt with S/T x 1 week Using Chloraseptic for sx Pt is transferring care here from a [facility] clinic She is also followed by Neuro in [place]  Review of Systems  Constitutional: Positive for activity change. Negative for fever, chills, appetite change and fatigue.  HENT: Positive for congestion, postnasal drip and sore throat. Negative for ear discharge, ear pain, nosebleeds, rhinorrhea, sinus pressure, sneezing and trouble swallowing.   Respiratory: Positive for cough. Negative for shortness of breath and wheezing.   Cardiovascular: Negative.        Objective:   Physical Exam  Constitutional: She appears well-developed and well-nourished.  HENT:  Right Ear: External ear normal.  Left Ear: External ear normal.  Mouth/Throat: Oropharynx is clear and moist. No oropharyngeal exudate.  + erythema noted  Neck: Neck supple. No thyromegaly present.  Cardiovascular: Normal rate, regular rhythm and normal heart sounds.   Pulmonary/Chest: Effort normal and breath sounds normal. No respiratory distress. She has no wheezes. She has no rales. She exhibits no tenderness.  Lymphadenopathy:    She has cervical adenopathy.  Nursing note and vitals reviewed. RST- neg     Assessment:     1. Sore throat        Plan:     Amox 875mg  bid x 10days Fluids Rest OTC meds for sx Pt to make f/u appt with provider to address her chronic conditions

## 2016-01-05 ENCOUNTER — Ambulatory Visit: Payer: BLUE CROSS/BLUE SHIELD | Admitting: Neurology

## 2016-01-07 ENCOUNTER — Encounter: Payer: Self-pay | Admitting: Nurse Practitioner

## 2016-01-07 ENCOUNTER — Ambulatory Visit: Payer: BLUE CROSS/BLUE SHIELD | Admitting: Nurse Practitioner

## 2016-01-07 ENCOUNTER — Ambulatory Visit (INDEPENDENT_AMBULATORY_CARE_PROVIDER_SITE_OTHER): Payer: BLUE CROSS/BLUE SHIELD | Admitting: Nurse Practitioner

## 2016-01-07 VITALS — BP 136/82 | HR 96 | Temp 98.1°F | Ht 66.0 in | Wt >= 6400 oz

## 2016-01-07 DIAGNOSIS — I10 Essential (primary) hypertension: Secondary | ICD-10-CM

## 2016-01-07 DIAGNOSIS — E669 Obesity, unspecified: Secondary | ICD-10-CM | POA: Diagnosis not present

## 2016-01-07 DIAGNOSIS — E785 Hyperlipidemia, unspecified: Secondary | ICD-10-CM

## 2016-01-07 DIAGNOSIS — G2581 Restless legs syndrome: Secondary | ICD-10-CM

## 2016-01-07 DIAGNOSIS — R739 Hyperglycemia, unspecified: Secondary | ICD-10-CM

## 2016-01-07 DIAGNOSIS — G4733 Obstructive sleep apnea (adult) (pediatric): Secondary | ICD-10-CM | POA: Diagnosis not present

## 2016-01-07 DIAGNOSIS — Z9989 Dependence on other enabling machines and devices: Secondary | ICD-10-CM

## 2016-01-07 NOTE — Progress Notes (Signed)
Subjective:    Patient ID: Cassandra Tucker, female    DOB: Jul 28, 1990, 25 y.o.   MRN: [id number]   Patient here today for follow up of chronic medical problems.  Outpatient Encounter Prescriptions as of 01/07/2016  Medication Sig  . acetaZOLAMIDE (DIAMOX) 500 MG capsule Take 1 capsule (500 mg total) by mouth 3 (three) times daily.  Marland Kitchen ibuprofen (ADVIL,MOTRIN) 200 MG tablet Take 400 mg by mouth every 6 (six) hours as needed for headache or moderate pain.   Marland Kitchen lisinopril (PRINIVIL,ZESTRIL) 20 MG tablet Take 1 tablet (20 mg total) by mouth daily.  Marland Kitchen rOPINIRole (REQUIP) 0.25 MG tablet Take 1 tablet (0.25 mg total) by mouth 3 (three) times daily.     Hypertension  This is a chronic problem. The current episode started more than 1 year ago. The problem has been waxing and waning since onset. The problem is uncontrolled. Associated symptoms include anxiety and malaise/fatigue. Risk factors for coronary artery disease include dyslipidemia, obesity and sedentary lifestyle. Past treatments include ACE inhibitors and diuretics. Compliance problems include diet and exercise.  There is no history of CAD/MI or CVA.  Hyperlipidemia  This is a chronic problem. The current episode started more than 1 year ago. The problem is uncontrolled. Recent lipid tests were reviewed and are variable. Exacerbating diseases include obesity. She has no history of diabetes or hypothyroidism. She is currently on no antihyperlipidemic treatment. The current treatment provides no improvement of lipids. Compliance problems include adherence to diet and adherence to exercise.  Risk factors for coronary artery disease include obesity and hypertension.  RLS Patient is currently on requip which she takes nightly- seem to help a lot. Sees Dr. Brett Fairy. OSA with CPAP Uses CPAP every night and feels rested in morning Pseudotumor cerebri Sees Neurologist every 3-6 months    Review of Systems  Constitutional: Positive for  malaise/fatigue.  HENT: Negative.   Respiratory: Negative.   Cardiovascular: Negative.   Gastrointestinal: Negative.   Genitourinary: Negative.   Neurological: Negative.   Psychiatric/Behavioral: Negative.   All other systems reviewed and are negative.      Objective:   Physical Exam  Constitutional: She is oriented to person, place, and time. She appears well-developed and well-nourished.  Cardiovascular: Normal rate, regular rhythm and normal heart sounds.   Pulmonary/Chest: Effort normal and breath sounds normal.  Musculoskeletal: She exhibits edema (mild edema left lower ext.).  Neurological: She is alert and oriented to person, place, and time. She has normal reflexes.  Skin: Skin is warm.  Psychiatric: She has a normal mood and affect. Her behavior is normal. Judgment and thought content normal.    BP 136/82 (BP Location: Left Arm, Cuff Size: Large)   Pulse 96   Temp 98.1 F (36.7 C) (Oral)   Ht 5' 6" (1.676 m)   Wt (!) 415 lb (188.2 kg)   BMI 66.98 kg/m         Assessment & Plan:  1. Essential hypertension Do not add salt to diet - CMP14+EGFR  2. OSA on CPAP Continue to use CPAP nightly  3. HLD (hyperlipidemia) Low fta diet - Lipid panel  4. Super obesity [facility]) Discussed bariatric surgery- referral made - Ambulatory referral to General Surgery - Thyroid Panel With TSH  5. RLS (restless legs syndrome) Continue requip as rx Keep legs wam at night    Labs pending Health maintenance reviewed Diet and exercise encouraged Continue all meds Follow up  In 6 months   [place], FNP

## 2016-01-07 NOTE — Patient Instructions (Signed)

## 2016-01-08 LAB — LIPID PANEL
CHOL/HDL RATIO: 5.8 ratio — AB (ref 0.0–4.4)
Cholesterol, Total: 198 mg/dL (ref 100–199)
HDL: 34 mg/dL — AB (ref >39)
LDL Calculated: 108 mg/dL — ABNORMAL HIGH (ref 0–99)
Triglycerides: 278 mg/dL — ABNORMAL HIGH (ref 0–149)
VLDL Cholesterol Cal: 56 mg/dL — ABNORMAL HIGH (ref 5–40)

## 2016-01-08 LAB — CMP14+EGFR
A/G RATIO: 1.6 (ref 1.2–2.2)
ALT: 19 IU/L (ref 0–32)
AST: 15 IU/L (ref 0–40)
Albumin: 4.2 g/dL (ref 3.5–5.5)
Alkaline Phosphatase: 59 IU/L (ref 39–117)
BILIRUBIN TOTAL: 0.6 mg/dL (ref 0.0–1.2)
BUN / CREAT RATIO: 13 (ref 9–23)
BUN: 9 mg/dL (ref 6–20)
CALCIUM: 10 mg/dL (ref 8.7–10.2)
CHLORIDE: 99 mmol/L (ref 96–106)
CO2: 26 mmol/L (ref 18–29)
Creatinine, Ser: 0.67 mg/dL (ref 0.57–1.00)
GFR, EST AFRICAN AMERICAN: 142 mL/min/{1.73_m2} (ref >59)
GFR, EST NON AFRICAN AMERICAN: 123 mL/min/{1.73_m2} (ref >59)
GLOBULIN, TOTAL: 2.6 g/dL (ref 1.5–4.5)
GLUCOSE: 144 mg/dL — AB (ref 65–99)
POTASSIUM: 4.5 mmol/L (ref 3.5–5.2)
SODIUM: 140 mmol/L (ref 134–144)
TOTAL PROTEIN: 6.8 g/dL (ref 6.0–8.5)

## 2016-01-08 LAB — THYROID PANEL WITH TSH
FREE THYROXINE INDEX: 2.5 (ref 1.2–4.9)
T3 UPTAKE RATIO: 29 % (ref 24–39)
T4 TOTAL: 8.5 ug/dL (ref 4.5–12.0)
TSH: 2.51 u[IU]/mL (ref 0.450–4.500)

## 2016-01-10 ENCOUNTER — Other Ambulatory Visit: Payer: Self-pay

## 2016-01-10 DIAGNOSIS — R739 Hyperglycemia, unspecified: Secondary | ICD-10-CM

## 2016-01-10 LAB — BAYER DCA HB A1C WAIVED: HB A1C (BAYER DCA - WAIVED): 6.5 % (ref <7.0)

## 2016-01-10 NOTE — Addendum Note (Signed)
Addended by: Margorie John on: 01/10/2016 05:03 PM   Modules accepted: Orders

## 2016-01-11 ENCOUNTER — Encounter: Payer: Self-pay | Admitting: Nurse Practitioner

## 2016-01-11 DIAGNOSIS — E119 Type 2 diabetes mellitus without complications: Secondary | ICD-10-CM

## 2016-01-11 HISTORY — DX: Type 2 diabetes mellitus without complications: E11.9

## 2016-02-07 ENCOUNTER — Ambulatory Visit (INDEPENDENT_AMBULATORY_CARE_PROVIDER_SITE_OTHER): Payer: BLUE CROSS/BLUE SHIELD | Admitting: Neurology

## 2016-02-07 ENCOUNTER — Encounter: Payer: Self-pay | Admitting: Neurology

## 2016-02-07 VITALS — BP 148/76 | HR 71 | Ht 66.0 in | Wt >= 6400 oz

## 2016-02-07 DIAGNOSIS — E669 Obesity, unspecified: Secondary | ICD-10-CM | POA: Diagnosis not present

## 2016-02-07 DIAGNOSIS — G4733 Obstructive sleep apnea (adult) (pediatric): Secondary | ICD-10-CM | POA: Diagnosis not present

## 2016-02-07 DIAGNOSIS — G932 Benign intracranial hypertension: Secondary | ICD-10-CM

## 2016-02-07 DIAGNOSIS — E662 Morbid (severe) obesity with alveolar hypoventilation: Secondary | ICD-10-CM | POA: Diagnosis not present

## 2016-02-07 DIAGNOSIS — D508 Other iron deficiency anemias: Secondary | ICD-10-CM

## 2016-02-07 DIAGNOSIS — Z79899 Other long term (current) drug therapy: Secondary | ICD-10-CM | POA: Diagnosis not present

## 2016-02-07 DIAGNOSIS — Z9989 Dependence on other enabling machines and devices: Secondary | ICD-10-CM

## 2016-02-07 MED ORDER — ACETAZOLAMIDE ER 500 MG PO CP12: 500.0000 mg | ORAL_CAPSULE | Freq: Three times a day (TID) | ORAL | 11 refills | Status: DC

## 2016-02-07 MED ORDER — ROPINIROLE HCL 0.25 MG PO TABS: 0.2500 mg | ORAL_TABLET | Freq: Three times a day (TID) | ORAL | 12 refills | Status: DC | PRN

## 2016-02-07 NOTE — Progress Notes (Signed)
[facility]  Provider:  Dr Lucia Gaskins Referring Provider: Inis Sizer, PA-C Primary Care Physician:  [name], FNP   Interval History 02/07/2016: Sleep evaluation showed severest osa, associated with hypercapnia and hypoxia. Has been wearing her cpap for 3.5 months. She saw Dr. Dione Booze and scheduled for follow up with Dr. Dione Booze in ophthamology. Taking the diamox 3x a day without side effects. No headaches, maybe brief ones 1-2x a month for 30 minutes. No changes in vision. She has not taking the diamox in 1-2 weeks, discussed non compliance and risks of permanent blindness. Discuss weight loss and exercise and diet. Stressed need for medication compliance. She has not followed up in months with and missed her last appointment with Dr. Dione Booze. Discussed personal responsibility of her own healthcare.  Interval history: Patient denies any headaches. She had OSA in the past, last sleep eval 3 years ago. She has not used her cpap. Will refer to Dr. Vickey Huger for sleep study. LP OP was 32cm and MRi of the brain c/w IIH. She has not seen an Ophthalmologist will refer to Marchelle Gearing for eye eval and VF testing.   IMPRESSION: This MRI of the brain with and without contrast shows the following: 1. 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 Though nonspecific, this can be seen with idiopathic intracranial hypertension (pseudotumor cerebri). 2. There is chronic sphenoid, left maxillary, right frontal and ethmoid sinusitis. 3. 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 4. Brain parenchyma is otherwise normal 5. There are no acute findings.  LP:  With the patient prone, the lower back was prepped with Betadine. 1% Lidocaine was used for local anesthesia. Lumbar puncture was performed at the L3-4 level using a 22 gauge needle with return of clear colorless CSF with an opening  pressure of 32 cm water. 27.5 ml of CSF were obtained for laboratory studies. The patient tolerated the procedure well and there were no apparent Complications.  reviewed images and test results with patient and her mother. Explained next steps.   HPI: Cassandra Tucker is a 26 y.o. 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 She has been having headaches for 8-9 days. She was diagnosed 3 years ago. She had lumbar puncture 3 years ago and it was 50. She stopped the medication about 2 years ago on her own because she was feeling fine. Hasn't had any problems until 2 weeks ago. She was having bad headaches, saw the doctor and said the swelling was back. The headaches are pressure all over. They are continuous. She has headaches when she wakes up. She has untreated sleep apnea. She can't tolerate the mask. Er started the medication. Had MRi of the brain 3 years ago. Headaches can be 10/10 pain, she also has shooting pain. She feels pressure in the ears. She has had episodic blurry vision. She just started the medication prescribed in the ED. She was prescribed 125mg  pills and instructed to take 2.5 pills three times a day.   Reviewed notes, labs and imaging from outside physicians, which showed:  She was seen in the ED on 06/12/2015 for optic disk edema and headches. She was sent by her optometrist after eye exam. She reported headaches for a week with dizziness and lightheadedness. She denied fever, chills, chest pain, shortness of breath, abdominal pain, N/V/D/C, numbness, weakness, paresthesia. She stopped her diamox years ago. She also reported non compliance with lisinopril.  She was started on Lisinopril and diamox and discharged with OP neurology follow up.  Review of Systems: Patient complains of symptoms per HPI as well as the following symptoms: No CP, No SOB. Pertinent negatives per HPI. All others negative.   Social History   Social History  . Marital status: Single     Spouse name: N/A  . Number of children: 0  . Years of education: 12+   Occupational History  . [employer]    Social History Main Topics  . Smoking status: Never Smoker  . Smokeless tobacco: Not on file  . Alcohol use 0.0 oz/week     Comment: socially  . Drug use: No  . Sexual activity: Not on file   Other Topics Concern  . Not on file   Social History Narrative   Lives with grandparents   Caffeine use: Drinks coffee/tea/soda- 20oz per day (none after starting diamox)    Family History  Problem Relation Age of Onset  . Migraines Neg Hx     Past Medical History:  Diagnosis Date  . Ankle fracture, left 2000  . Arthritis    left wrist and left ankle  . Bronchitis   . Diabetes (HCC) 01/11/2016  . Hypertension 2013    Past Surgical History:  Procedure Laterality Date  . FRACTURE SURGERY  2005  . WRIST SURGERY  2015    Current Outpatient Prescriptions  Medication Sig Dispense Refill  . acetaZOLAMIDE (DIAMOX) 500 MG capsule Take 1 capsule (500 mg total) by mouth 3 (three) times daily. 90 capsule 11  . ibuprofen (ADVIL,MOTRIN) 200 MG tablet Take 400 mg by mouth every 6 (six) hours as needed for headache or moderate pain.     Marland Kitchen. lisinopril (PRINIVIL,ZESTRIL) 20 MG tablet Take 1 tablet (20 mg total) by mouth daily. 90 tablet 4  . rOPINIRole (REQUIP) 0.25 MG tablet Take 1 tablet (0.25 mg total) by mouth 3 (three) times daily. 30 tablet 5   No current facility-administered medications for this visit.     Allergies as of 02/07/2016  . (No Known Allergies)    Vitals: BP (!) 148/76   Pulse 71   Ht 5\' 6"  (1.676 m)   Wt (!) 421 lb (191 kg)   BMI 67.95 kg/m  Last Weight:  Wt Readings from Last 1 Encounters:  02/07/16 (!) 421 lb (191 kg)   Last Height:   Ht Readings from Last 1 Encounters:  02/07/16 5\' 6"  (1.676 m)       Speech:  Speech is normal; fluent and spontaneous with normal comprehension.  Cognition:  The patient is oriented to person,  place, and time;   recent and remote memory intact;   language fluent;   normal attention, concentration,   fund of knowledge Cranial Nerves: Minimal improved papilledema R>L  The pupils are equal, round, and reactive to light. Visual fields are full to finger confrontation. Extraocular movements are intact. Trigeminal sensation is intact and the muscles of mastication are normal. The face is symmetric. The palate elevates in the midline. Hearing intact. Voice is normal. Shoulder shrug is normal. The tongue has normal motion without fasciculations.   Coordination:  No dysmetria  Gait:  Wide-based due to large body habitus  Motor Observation:  No asymmetry, no atrophy, and no involuntary movements noted. Tone:  Normal muscle tone.   Posture:  Posture is normal. normal erect   Strength:  Strength is V/V in the upper and lower limbs.    Sensation: intact  to LT   Reflex Exam:  DTR's:  Deep tendon reflexes in the upper and lower extremities are normal bilaterally.  Toes:  The toes are downgoing bilaterally.  Clonus:  Clonus is absent.      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 Continue Diamox 500mg  tid Discussed personal responsibility of her own healthcare.She needs to take her medication and show up to her appointments.  Discussed weight loss, eating healthy, possibly bariatric surgery.  Discussed that untreated IIH can cause permanent vision loss, stressed medication compliance and weight loss, if vision worsens call office or proceed to ED. Follow up with Dr. Derry Lory, MD  [facility] [address] [place], [place] [address]  Phone [phone] Fax [phone]  A total  of 30 minutes was spent face-to-face with this patient. Over half this time was spent on counseling patient on the IIH diagnosis and different diagnostic and therapeutic options available.

## 2016-02-07 NOTE — Patient Instructions (Addendum)
Remember to drink plenty of fluid, eat healthy meals and do not skip any meals. Try to eat protein with a every meal and eat a healthy snack such as fruit or nuts in between meals. Try to keep a regular sleep-wake schedule and try to exercise daily, particularly in the form of walking, 20-30 minutes a day, if you can.   As far as your medications are concerned, I would like to suggest: Continue current medications. Labs today  I would like to see you back in 3 months, sooner if we need to. Please call us with any interim questions, concerns, problems, updates or refill requests.   Our phone number is [phone]. We also have an after hours call service for urgent matters and there is a physician on-call for urgent questions. For any emergencies you know to call 911 or go to the nearest emergency room

## 2016-02-08 ENCOUNTER — Telehealth: Payer: Self-pay | Admitting: *Deleted

## 2016-02-08 LAB — CBC
HEMATOCRIT: 37.5 % (ref 34.0–46.6)
HEMOGLOBIN: 11.8 g/dL (ref 11.1–15.9)
MCH: 24.2 pg — AB (ref 26.6–33.0)
MCHC: 31.5 g/dL (ref 31.5–35.7)
MCV: 77 fL — AB (ref 79–97)
Platelets: 252 10*3/uL (ref 150–379)
RBC: 4.87 x10E6/uL (ref 3.77–5.28)
RDW: 16 % — ABNORMAL HIGH (ref 12.3–15.4)
WBC: 9.6 10*3/uL (ref 3.4–10.8)

## 2016-02-08 LAB — FERRITIN: Ferritin: 45 ng/mL (ref 15–150)

## 2016-02-08 NOTE — Telephone Encounter (Signed)
-----   Message from Anson FretAntonia B Ahern, MD sent at 02/08/2016 12:39 PM EDT ----- Patient has some decreased iron, mild iron deficiency which can be causing her RLS. I recommend taking iron supplements orally twice daily you can get iron 325mg  over the counter may take several months of oral supplementation to feel better thanks

## 2016-02-08 NOTE — Telephone Encounter (Signed)
Called and LVM for pt about results per Dr Lucia GaskinsAhern. Relayed she would like her to take 325mg  iron 2x/day for the next several months. Gave [facility] phone number if she has further questions.

## 2016-02-15 ENCOUNTER — Telehealth: Payer: Self-pay | Admitting: Nurse Practitioner

## 2016-04-07 ENCOUNTER — Ambulatory Visit: Payer: BLUE CROSS/BLUE SHIELD | Admitting: Nurse Practitioner

## 2016-04-21 ENCOUNTER — Ambulatory Visit (INDEPENDENT_AMBULATORY_CARE_PROVIDER_SITE_OTHER): Payer: BLUE CROSS/BLUE SHIELD | Admitting: Nurse Practitioner

## 2016-04-21 ENCOUNTER — Encounter: Payer: Self-pay | Admitting: Nurse Practitioner

## 2016-04-21 VITALS — BP 142/98 | HR 117 | Temp 96.9°F | Ht 66.0 in | Wt >= 6400 oz

## 2016-04-21 DIAGNOSIS — E7849 Other hyperlipidemia: Secondary | ICD-10-CM

## 2016-04-21 DIAGNOSIS — I10 Essential (primary) hypertension: Secondary | ICD-10-CM | POA: Diagnosis not present

## 2016-04-21 DIAGNOSIS — G2581 Restless legs syndrome: Secondary | ICD-10-CM

## 2016-04-21 DIAGNOSIS — K219 Gastro-esophageal reflux disease without esophagitis: Secondary | ICD-10-CM | POA: Insufficient documentation

## 2016-04-21 DIAGNOSIS — E784 Other hyperlipidemia: Secondary | ICD-10-CM

## 2016-04-21 DIAGNOSIS — E119 Type 2 diabetes mellitus without complications: Secondary | ICD-10-CM | POA: Diagnosis not present

## 2016-04-21 DIAGNOSIS — E669 Obesity, unspecified: Secondary | ICD-10-CM

## 2016-04-21 LAB — BAYER DCA HB A1C WAIVED: HB A1C: 6.5 % (ref <7.0)

## 2016-04-21 MED ORDER — OMEPRAZOLE 40 MG PO CPDR: 40.0000 mg | DELAYED_RELEASE_CAPSULE | Freq: Every day | ORAL | 3 refills | Status: DC

## 2016-04-21 MED ORDER — PRAMIPEXOLE DIHYDROCHLORIDE 0.75 MG PO TABS: 0.7500 mg | ORAL_TABLET | Freq: Three times a day (TID) | ORAL | 1 refills | Status: DC

## 2016-04-21 MED ORDER — LISINOPRIL 40 MG PO TABS: 40.0000 mg | ORAL_TABLET | Freq: Every day | ORAL | 3 refills | Status: DC

## 2016-04-21 NOTE — Progress Notes (Signed)
 Subjective:    Patient ID: Cassandra Tucker, female    DOB: 10/24/1990, 24 y.o.   MRN: [id number]   Patient here today for follow up of chronic medical problems. No changes since last visit  Outpatient Encounter Prescriptions as of 01/07/2016  Medication Sig  . acetaZOLAMIDE (DIAMOX) 500 MG capsule Take 1 capsule (500 mg total) by mouth 3 (three) times daily.  . ibuprofen (ADVIL,MOTRIN) 200 MG tablet Take 400 mg by mouth every 6 (six) hours as needed for headache or moderate pain.   . lisinopril (PRINIVIL,ZESTRIL) 20 MG tablet Take 1 tablet (20 mg total) by mouth daily.  . rOPINIRole (REQUIP) 0.25 MG tablet Take 1 tablet (0.25 mg total) by mouth 3 (three) times daily.   * C/O reflux symptoms daily- no no meds for this  Hypertension  This is a chronic problem. The current episode started more than 1 year ago. The problem has been waxing and waning since onset. The problem is uncontrolled. Associated symptoms include anxiety. Risk factors for coronary artery disease include dyslipidemia, obesity and sedentary lifestyle. Past treatments include ACE inhibitors and diuretics. Compliance problems include diet and exercise.  There is no history of CAD/MI or CVA.  Hyperlipidemia  This is a chronic problem. The current episode started more than 1 year ago. The problem is uncontrolled. Recent lipid tests were reviewed and are variable. Exacerbating diseases include obesity. She has no history of diabetes or hypothyroidism. She is currently on no antihyperlipidemic treatment. The current treatment provides no improvement of lipids. Compliance problems include adherence to diet and adherence to exercise.  Risk factors for coronary artery disease include obesity and hypertension.  Diabetes  She presents for her follow-up diabetic visit. She has type 2 diabetes mellitus. No MedicAlert identification noted. There are no diabetic associated symptoms. Symptoms are stable. Pertinent negatives for diabetic  complications include no CVA. Risk factors for coronary artery disease include diabetes mellitus, dyslipidemia, obesity and hypertension. Current diabetic treatment includes diet. She is compliant with treatment some of the time. When asked about meal planning, she reported none. She has not had a previous visit with a dietitian. She rarely participates in exercise. Home blood sugar record trend: does not check blood sugars at home. An ACE inhibitor/angiotensin II receptor blocker is being taken. She does not see a podiatrist.Eye exam is not current.  RLS Patient was on requip which she took nightly- It made her legs feel tingly and numb feeling so she stopped taking. SHe says she des need something. OSA with CPAP Uses CPAP every night and feels rested in morning Pseudotumor cerebri Sees Neurologist every 3-6 months- denies any frequent headaches and no seizure activity.    Review of Systems  HENT: Negative.   Respiratory: Negative.   Cardiovascular: Negative.   Gastrointestinal: Negative.   Genitourinary: Negative.   Neurological: Negative.   Psychiatric/Behavioral: Negative.   All other systems reviewed and are negative.      Objective:   Physical Exam  Constitutional: She is oriented to person, place, and time. She appears well-developed and well-nourished.  Cardiovascular: Normal rate, regular rhythm and normal heart sounds.   Pulmonary/Chest: Effort normal and breath sounds normal.  Musculoskeletal: She exhibits edema (mild edema left lower ext.).  Neurological: She is alert and oriented to person, place, and time. She has normal reflexes.  Skin: Skin is warm.  Psychiatric: She has a normal mood and affect. Her behavior is normal. Judgment and thought content normal.   BP (!) 142/98 (BP   Location: Left Arm, Cuff Size: Large)   Pulse (!) 117   Temp (!) 96.9 F (36.1 C) (Oral)   Ht 5' 6" (1.676 m)   Wt (!) 420 lb (190.5 kg)   BMI 67.79 kg/m     Hgba1c 6.5%     Assessment  & Plan:  1. Essential hypertension Low sodium diet Increased lisinpril to 40mg daily - CMP14+EGFR - lisinopril (PRINIVIL,ZESTRIL) 40 MG tablet; Take 1 tablet (40 mg total) by mouth daily.  Dispense: 90 tablet; Refill: 3  2. Other hyperlipidemia Low fat diet - Lipid panel  3. Controlled type 2 diabetes mellitus without complication, without long-term current use of insulin (HCC) Continue to watch carbs in diet - Bayer DCA Hb A1c Waived - Microalbumin / creatinine urine ratio  4. RLS (restless legs syndrome) Changed from requip to mirapaex Keep legs warm at night - pramipexole (MIRAPEX) 0.75 MG tablet; Take 1 tablet (0.75 mg total) by mouth 3 (three) times daily.  Dispense: 90 tablet; Refill: 1  5. Super obesity (HCC) Did referral to bariatric physician at last visit but patient has not heard anything  6. Gastroesophageal reflux disease without esophagitis Avoid spicy foods Do not eat 2 hours prior to bedtime - omeprazole (PRILOSEC) 40 MG capsule; Take 1 capsule (40 mg total) by mouth daily.  Dispense: 30 capsule; Refill: 3    Labs pending Health maintenance reviewed Diet and exercise encouraged Continue all meds Follow up  In 3 months   Mary[place]Margaret , FNP   

## 2016-04-21 NOTE — Patient Instructions (Signed)
Bariatric Surgery Information Bariatric surgery, also called weight loss surgery, is a procedure that helps you lose weight. You may consider or your health care provider may suggest bariatric surgery if:  You are severely obese and have been unable to lose weight through diet and exercise.  You have health problems related to obesity, such as:  Type 2 diabetes.  Heart disease.  Lung disease. HOW DOES BARIATRIC SURGERY HELP ME LOSE WEIGHT?  Bariatric surgery helps you lose weight by decreasing how much food your body absorbs. This is done by closing off part of your stomach to make it smaller. This restricts the amount of food your stomach can hold. Bariatric surgery can also change your body's regular digestive process, so that food bypasses the parts of your body that absorb calories and nutrients.  If you decide to have bariatric surgery, it is important to continue to eat a healthy diet and exercise regularly after the surgery.  WHAT ARE THE DIFFERENT KINDS OF BARIATRIC SURGERY?  There are two kinds of bariatric surgeries:  Restrictive surgeries make your stomach smaller. They do not change your digestive process. The smaller the size of your new stomach, the less food you can eat. There are different types of restrictive surgeries.  Malabsorptive surgeries both make your stomach smaller and alter your digestive process so that your body processes less calories and nutrients. These are the most common kind of bariatric surgery. There are different types of malabsorptive surgeries. WHAT ARE THE DIFFERENT TYPES OF RESTRICTIVE SURGERY? Adjustable Gastric Banding In this procedure, an inflatable band is placed around your stomach near the upper end. This makes the passageway for food into the rest of your stomach much smaller. The band can be adjusted, making it tighter or looser, by filling it with salt solution. Your surgeon can adjust the band based on how are you feeling and how much  weight you are losing. The band can be removed in the future.  Vertical Banded Gastroplasty In this procedure, staples are used to separate your stomach into two parts, a small upper pouch and a bigger lower pouch. This decreases how much food you can eat. Sleeve Gastrectomy In this procedure, your stomach is made smaller. This is done by surgically removing a large part of your stomach. When your stomach is smaller, you feel full more quickly and reduce how much you eat. WHAT ARE THE DIFFERENT TYPES OF MALABSORPTIVE SURGERY? Roux-en-Y Gastric Bypass (RGB) This is the most common weight loss surgery. In this procedure, a small stomach pouch is created in the upper part of your stomach. Next, this small stomach pouch is attached directly to the middle part of your small intestine. The farther down your small intestine the new connection is made, the fewer calories and nutrients you will absorb.  Biliopancreatic Diversion with Duodenal Switch (BPD/DS)  This is a multi-step procedure. In this procedure, a large part of your stomach is removed, making your stomach smaller. Next, this smaller stomach is attached to the lower part of your small intestine. Like the RGB surgery, you absorb fewer calories and nutrients the farther down your small intestine the attachment is made.   WHAT ARE THE RISKS OF BARIATRIC SURGERY? As with any surgical procedure, each type of bariatric surgery has its own risks. These risks also depend on your age, your overall health, and any other medical conditions you may have. When deciding on bariatric surgery, it is very important to:   Talk to your health care provider   and choose the surgery that is best for you.  Ask your health care provider about specific risks for the surgery you choose. FOR MORE INFORMATION  American Society for Metabolic & Bariatric Surgery: www.asmbs.org  Weight-control Information Network (WIN): win.niddk.nih.gov   This information is not  intended to replace advice given to you by your health care provider. Make sure you discuss any questions you have with your health care provider.   Document Released: 05/29/2005 Document Revised: 02/17/2015 Document Reviewed: 11/27/2012 Elsevier Interactive Patient Education 2016 Elsevier Inc.  

## 2016-04-22 LAB — CMP14+EGFR
ALK PHOS: 60 IU/L (ref 39–117)
ALT: 27 IU/L (ref 0–32)
AST: 18 IU/L (ref 0–40)
Albumin/Globulin Ratio: 1.6 (ref 1.2–2.2)
Albumin: 4.4 g/dL (ref 3.5–5.5)
BUN/Creatinine Ratio: 14 (ref 9–23)
BUN: 10 mg/dL (ref 6–20)
Bilirubin Total: 0.6 mg/dL (ref 0.0–1.2)
CO2: 27 mmol/L (ref 18–29)
CREATININE: 0.72 mg/dL (ref 0.57–1.00)
Calcium: 9.9 mg/dL (ref 8.7–10.2)
Chloride: 99 mmol/L (ref 96–106)
GFR calc Af Amer: 136 mL/min/{1.73_m2} (ref >59)
GFR calc non Af Amer: 118 mL/min/{1.73_m2} (ref >59)
Globulin, Total: 2.7 g/dL (ref 1.5–4.5)
Glucose: 103 mg/dL — ABNORMAL HIGH (ref 65–99)
POTASSIUM: 4.4 mmol/L (ref 3.5–5.2)
Sodium: 143 mmol/L (ref 134–144)
Total Protein: 7.1 g/dL (ref 6.0–8.5)

## 2016-04-22 LAB — LIPID PANEL
CHOLESTEROL TOTAL: 214 mg/dL — AB (ref 100–199)
Chol/HDL Ratio: 5.8 ratio units — ABNORMAL HIGH (ref 0.0–4.4)
HDL: 37 mg/dL — ABNORMAL LOW (ref >39)
LDL CALC: 110 mg/dL — AB (ref 0–99)
TRIGLYCERIDES: 337 mg/dL — AB (ref 0–149)
VLDL Cholesterol Cal: 67 mg/dL — ABNORMAL HIGH (ref 5–40)

## 2016-04-24 ENCOUNTER — Other Ambulatory Visit: Payer: Self-pay | Admitting: Nurse Practitioner

## 2016-04-24 MED ORDER — METFORMIN HCL 500 MG PO TABS: 500.0000 mg | ORAL_TABLET | Freq: Two times a day (BID) | ORAL | 5 refills | Status: DC

## 2016-05-02 ENCOUNTER — Telehealth: Payer: Self-pay | Admitting: Nurse Practitioner

## 2016-05-03 MED ORDER — LISINOPRIL 20 MG PO TABS: 20.0000 mg | ORAL_TABLET | Freq: Every day | ORAL | 3 refills | Status: DC

## 2016-05-03 MED ORDER — HYDROCHLOROTHIAZIDE 12.5 MG PO TABS: 12.5000 mg | ORAL_TABLET | Freq: Every day | ORAL | 3 refills | Status: DC

## 2016-05-03 NOTE — Telephone Encounter (Signed)
Pt can decrease lisinopril back to 20 mg. Will send in HCTZ 12.5 mg low dose fluid pill to take with.

## 2016-05-03 NOTE — Telephone Encounter (Signed)
bs running normal with metformin BP running 103/66 - 117/77 Felt dizzy, light headed  - she can tell that her BP is too low  MMM increased her lisinopril from 20 to 40 on Friday -- and she recently started the metformin.  Could she go back to 20 mg on the lisinopril and cont the metformin as directed.??    [facility] coverage for MMM

## 2016-05-03 NOTE — Telephone Encounter (Signed)
Pt aware of all meds and changes

## 2016-05-11 ENCOUNTER — Telehealth: Payer: Self-pay

## 2016-05-11 NOTE — Telephone Encounter (Signed)
FYI I LM for patient to call back. She was mistakenly put on Dr. Teofilo PodAthar's schedule on Monday. She is to f/u with Dr. Lucia GaskinsAhern. I asked her to call back and reschedule so she can see the correct Neurologist.

## 2016-05-15 ENCOUNTER — Ambulatory Visit: Payer: BLUE CROSS/BLUE SHIELD | Admitting: Neurology

## 2016-05-15 NOTE — Telephone Encounter (Signed)
Noted, thank you

## 2016-05-15 NOTE — Telephone Encounter (Signed)
Pt on AA,MD schedule for 05/17/16.

## 2016-05-17 ENCOUNTER — Encounter: Payer: Self-pay | Admitting: Neurology

## 2016-05-17 ENCOUNTER — Ambulatory Visit (INDEPENDENT_AMBULATORY_CARE_PROVIDER_SITE_OTHER): Payer: BLUE CROSS/BLUE SHIELD | Admitting: Neurology

## 2016-05-17 VITALS — BP 140/74 | HR 119 | Ht 66.0 in | Wt >= 6400 oz

## 2016-05-17 DIAGNOSIS — G932 Benign intracranial hypertension: Secondary | ICD-10-CM

## 2016-05-17 NOTE — Progress Notes (Signed)
[facility]   Provider:  Dr Lucia GaskinsAhern Referring Provider: Inis SizerWebster, William L, PA-C Primary Care Physician:  Bennie PieriniMary-Margaret Martin, FNP   Interval history 05/17/2016: Patient with IIH. [facility]. Cassandra Tucker primary care has been managing Cassandra Tucker blood pressure and also prescribed metformin. Diamox 3x a day and no headaches. She is following with Dr. Dione BoozeGroat.  She has lost 20 pounds. She is 400 pounds, discussed weight loss and the new [facility]  Interval History 02/07/2016: Sleep evaluation showed severest osa, associated with hypercapnia and hypoxia. Has been wearing Cassandra Tucker cpap for 3.5 months. She saw Dr. Dione BoozeGroat and scheduled for follow up with Dr. Dione BoozeGroat in ophthamology. Taking the diamox 3x a day without side effects. No headaches, maybe brief ones 1-2x a month for 30 minutes. No changes in vision. She has not taking the diamox in 1-2 weeks, discussed non compliance and risks of permanent blindness. Discuss weight loss and exercise and diet. Stressed need for medication compliance. She has not followed up in months with and missed Cassandra Tucker last appointment with Dr. Dione BoozeGroat. Discussed personal responsibility of Cassandra Tucker own healthcare.  Interval history:Patient denies any headaches. She had OSA in the past, last sleep eval 3 years ago. She has not used Cassandra Tucker cpap. Will refer to Dr. Vickey Hugerohmeier for sleep study. LP OP was 32cm and MRi of the brain c/w IIH. She has not seen an Ophthalmologist will refer to Marchelle Gearinghris Groat for eye eval and VF testing.   IMPRESSION: This MRI of the brain with and without contrast shows the following: 1. 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 Though nonspecific, this can be seen with idiopathic intracranial hypertension (pseudotumor cerebri). 2. There is chronic sphenoid, left maxillary, right frontal and ethmoid sinusitis. 3. 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 4. Brain parenchyma is otherwise normal 5. There are no acute findings.  LP:  With the patient prone, the lower back was prepped with Betadine. 1% Lidocaine was used for local anesthesia. Lumbar puncture was performed at the L3-4 level using a 22 gauge needle with return of clear colorless CSF with an opening pressure of 32 cm water. 27.5 ml of CSF were obtained for laboratory studies. The patient tolerated the procedure well and there were no apparent Complications.  reviewed images and test results with patient and Cassandra Tucker mother. Explained next steps.   [name]:Cassandra Tucker is a 25 y.o. female here as a referral from Dr. Kerrie BuffaloWestfall for IIH. She has been having headaches for 8-9 days. She was diagnosed 3 years ago. She had lumbar puncture 3 years ago and it was 50. She stopped the medication about 2 years ago on Cassandra Tucker own because she was feeling fine. Hasn't had any problems until 2 weeks ago. She was having bad headaches, saw the doctor and said the swelling was back. The headaches are pressure all over. They are continuous. She has headaches when she wakes up. She has untreated sleep apnea. She can't tolerate the mask. Er started the medication. Had MRi of the brain 3 years ago. Headaches can be 10/10 pain, she also has shooting pain. She feels pressure in the ears. She has had episodic blurry vision. She just started the medication prescribed in the ED. She was prescribed 125mg  pills and instructed to take 2.5 pills three times a day.   Reviewed notes, labs and imaging from outside physicians, which showed:  She was seen in the  ED on 06/12/2015 for optic disk edema and headches. She was sent by Cassandra Tucker optometrist after eye exam. She reported headaches for a week with dizziness and lightheadedness. She denied fever, chills, chest pain, shortness of breath, abdominal pain, N/V/D/C, numbness, weakness, paresthesia. She stopped Cassandra Tucker diamox years ago. She also reported non  compliance with lisinopril. She was started on Lisinopril and diamox and discharged with OP neurology follow up.  Review of Systems: Patient complains of symptoms per HPI as well as the following symptoms: No CP, No SOB. Pertinent negatives per HPI. All others negative.    Social History   Social History  . Marital status: Single    Spouse name: N/A  . Number of children: 0  . Years of education: 12+   Occupational History  . [employer]    Social History Main Topics  . Smoking status: Never Smoker  . Smokeless tobacco: Not on file  . Alcohol use 0.0 oz/week     Comment: socially  . Drug use: No  . Sexual activity: Not on file   Other Topics Concern  . Not on file   Social History Narrative   Lives with grandparents   Caffeine use: Drinks coffee/tea/soda- 20oz per day (none after starting diamox)    Family History  Problem Relation Age of Onset  . Migraines Neg Hx     Past Medical History:  Diagnosis Date  . Ankle fracture, left 2000  . Arthritis    left wrist and left ankle  . Bronchitis   . Diabetes (HCC) 01/11/2016  . Hypertension 2013    Past Surgical History:  Procedure Laterality Date  . FRACTURE SURGERY  2005  . WRIST SURGERY  2015    Current Outpatient Prescriptions  Medication Sig Dispense Refill  . acetaZOLAMIDE (DIAMOX) 500 MG capsule Take 1 capsule (500 mg total) by mouth 3 (three) times daily. 90 capsule 11  . hydrochlorothiazide (HYDRODIURIL) 12.5 MG tablet Take 1 tablet (12.5 mg total) by mouth daily. 90 tablet 3  . ibuprofen (ADVIL,MOTRIN) 200 MG tablet Take 400 mg by mouth every 6 (six) hours as needed for headache or moderate pain.     Marland Kitchen. lisinopril (PRINIVIL,ZESTRIL) 20 MG tablet Take 1 tablet (20 mg total) by mouth daily. 90 tablet 3  . omeprazole (PRILOSEC) 40 MG capsule Take 1 capsule (40 mg total) by mouth daily. 30 capsule 3  . pramipexole (MIRAPEX) 0.75 MG tablet Take 1 tablet (0.75 mg total) by mouth 3 (three) times  daily. 90 tablet 1   No current facility-administered medications for this visit.     Allergies as of 05/17/2016  . (No Known Allergies)    Vitals: BP 140/74 (BP Location: Right Arm, Patient Position: Sitting, Cuff Size: Normal)   Pulse (!) 119   Ht 5\' 6"  (1.676 m)   Wt (!) 400 lb 3.2 oz (181.5 kg)   BMI 64.59 kg/m  Last Weight:  Wt Readings from Last 1 Encounters:  05/17/16 (!) 400 lb 3.2 oz (181.5 kg)   Last Height:   Ht Readings from Last 1 Encounters:  05/17/16 5\' 6"  (1.676 m)     Speech: Speech is normal; fluent and spontaneous with normal comprehension.  Cognition: The patient is oriented to person, place, and time;  recent and remote memory intact;  language fluent;  normal attention, concentration,  fund of knowledge Cranial Nerves: Minimal improved papilledema R>L The pupils are equal, round, and reactive to light. Visual fields are full to finger confrontation. Extraocular  movements are intact. Trigeminal sensation is intact and the muscles of mastication are normal. The face is symmetric. The palate elevates in the midline. Hearing intact. Voice is normal. Shoulder shrug is normal. The tongue has normal motion without fasciculations.   Coordination: No dysmetria  Gait: Wide-based due to large body habitus  Motor Observation: No asymmetry, no atrophy, and no involuntary movements noted. Tone: Normal muscle tone.  Posture: Posture is normal. normal erect  Strength: Strength is V/V in the upper and lower limbs.   Sensation: intact to LT  Reflex Exam:  DTR's: Deep tendon reflexes in the upper and lower extremities are normal bilaterally. Toes: The toes are downgoing bilaterally. Clonus: Clonus is absent.      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 Continue Diamox 500mg  tid Discussed personal responsibility of Cassandra Tucker own healthcare.She needs to take Cassandra Tucker medication and show up to Cassandra Tucker appointments.  Discussed weight loss, eating healthy, possibly bariatric surgery.  Discussed that untreated IIH can cause permanent vision loss, stressed medication compliance and weight loss, if vision worsens call office or proceed to ED. Follow up with Dr. Derry Lory, MD  [facility] [address] [place], [place] [address]  Phone [phone] Fax [phone]  A total of 30 minutes was spent face-to-face with this patient. Over half this time was spent on counseling patient on the IIH diagnosis and different diagnostic and therapeutic options available.

## 2016-05-17 NOTE — Patient Instructions (Addendum)
Remember to drink plenty of fluid, eat healthy meals and do not skip any meals. Try to eat protein with a every meal and eat a healthy snack such as fruit or nuts in between meals. Try to keep a regular sleep-wake schedule and try to exercise daily, particularly in the form of walking, 20-30 minutes a day, if you can.   As far as your medications are concerned, I would like to suggest: Continue Diamox  Dr. Baruch GoutyBeasley [facility], call directly and schedule appointment starting to see patient in March but call January and schedule appointment.  I would like to see you back in 6 months, sooner if we need to. Please call us with any interim questions, concerns, problems, updates or refill requests.   Our phone number is [phone]. We also have an after hours call service for urgent matters and there is a physician on-call for urgent questions. For any emergencies you know to call 911 or go to the nearest emergency room

## 2016-05-26 ENCOUNTER — Encounter: Payer: Self-pay | Admitting: Physician Assistant

## 2016-05-26 ENCOUNTER — Ambulatory Visit (INDEPENDENT_AMBULATORY_CARE_PROVIDER_SITE_OTHER): Payer: BLUE CROSS/BLUE SHIELD | Admitting: Physician Assistant

## 2016-05-26 VITALS — BP 138/90 | HR 129 | Temp 97.0°F | Ht 66.0 in | Wt >= 6400 oz

## 2016-05-26 DIAGNOSIS — R05 Cough: Secondary | ICD-10-CM

## 2016-05-26 DIAGNOSIS — J209 Acute bronchitis, unspecified: Secondary | ICD-10-CM | POA: Diagnosis not present

## 2016-05-26 DIAGNOSIS — R059 Cough, unspecified: Secondary | ICD-10-CM

## 2016-05-26 MED ORDER — HYDROCODONE-HOMATROPINE 5-1.5 MG/5ML PO SYRP: 5.0000 mL | ORAL_SOLUTION | Freq: Four times a day (QID) | ORAL | 0 refills | Status: DC | PRN

## 2016-05-26 MED ORDER — ALBUTEROL SULFATE HFA 108 (90 BASE) MCG/ACT IN AERS: 2.0000 | INHALATION_SPRAY | Freq: Four times a day (QID) | RESPIRATORY_TRACT | 1 refills | Status: DC | PRN

## 2016-05-26 MED ORDER — PREDNISONE 10 MG (21) PO TBPK: ORAL_TABLET | ORAL | 0 refills | Status: DC

## 2016-05-26 MED ORDER — CEFDINIR 300 MG PO CAPS: 300.0000 mg | ORAL_CAPSULE | Freq: Two times a day (BID) | ORAL | 0 refills | Status: DC

## 2016-05-26 NOTE — Patient Instructions (Signed)

## 2016-05-29 NOTE — Progress Notes (Signed)
BP 138/90   Pulse (!) 129   Temp 97 F (36.1 C) (Oral)   Ht 5\' 6"  (1.676 m)   Wt (!) 400 lb 12.8 oz (181.8 kg)   BMI 64.69 kg/m    Subjective:    Patient ID: Cassandra Tucker, female    DOB: 01/09/1991, 25 y.o.   MRN: [id number]  HPI: Cassandra Tucker is a 25 y.o. female presenting on 05/26/2016 for Cough (x 3 weeks); chest congestion; and Ear Pain (left ) Patient has had a history of bronchitis in the past. She has been dealing with this infection for several weeks now. It is causing a significant amount of cough congestion and chest pressure. She has productive cough at times that is yellow in color. She has some mild chills. She states she has not been running a fever in recent days. She has had multiple over-the-counter medications without much relief. Relevant past medical, surgical, family and social history reviewed and updated as indicated. Allergies and medications reviewed and updated.  Past Medical History:  Diagnosis Date  . Ankle fracture, left 2000  . Arthritis    left wrist and left ankle  . Bronchitis   . Diabetes (HCC) 01/11/2016  . Hypertension 2013    Past Surgical History:  Procedure Laterality Date  . FRACTURE SURGERY  2005  . WRIST SURGERY  2015    Review of Systems  Constitutional: Positive for chills and fatigue. Negative for activity change, appetite change and fever.  HENT: Positive for congestion, postnasal drip and sore throat.   Eyes: Negative.   Respiratory: Positive for cough and wheezing.   Cardiovascular: Negative.  Negative for chest pain, palpitations and leg swelling.  Gastrointestinal: Negative.   Genitourinary: Negative.   Musculoskeletal: Negative.   Skin: Negative.   Neurological: Positive for headaches.    Allergies as of 05/26/2016   No Known Allergies     Medication List       Accurate as of 05/26/16 11:59 PM. Always use your most recent med list.          acetaZOLAMIDE 500 MG capsule Commonly known as:   DIAMOX Take 1 capsule (500 mg total) by mouth 3 (three) times daily.   albuterol 108 (90 Base) MCG/ACT inhaler Commonly known as:  PROVENTIL HFA;VENTOLIN HFA Inhale 2 puffs into the lungs every 6 (six) hours as needed for wheezing or shortness of breath.   cefdinir 300 MG capsule Commonly known as:  OMNICEF Take 1 capsule (300 mg total) by mouth 2 (two) times daily. 1 po BID   hydrochlorothiazide 12.5 MG tablet Commonly known as:  HYDRODIURIL Take 1 tablet (12.5 mg total) by mouth daily.   HYDROcodone-homatropine 5-1.5 MG/5ML syrup Commonly known as:  HYCODAN Take 5-10 mLs by mouth every 6 (six) hours as needed.   ibuprofen 200 MG tablet Commonly known as:  ADVIL,MOTRIN Take 400 mg by mouth every 6 (six) hours as needed for headache or moderate pain.   lisinopril 20 MG tablet Commonly known as:  PRINIVIL,ZESTRIL Take 1 tablet (20 mg total) by mouth daily.   omeprazole 40 MG capsule Commonly known as:  PRILOSEC Take 1 capsule (40 mg total) by mouth daily.   pramipexole 0.75 MG tablet Commonly known as:  MIRAPEX Take 1 tablet (0.75 mg total) by mouth 3 (three) times daily.   predniSONE 10 MG (21) Tbpk tablet Commonly known as:  STERAPRED UNI-PAK 21 TAB As directed x 6 days  Objective:    BP 138/90   Pulse (!) 129   Temp 97 F (36.1 C) (Oral)   Ht 5\' 6"  (1.676 m)   Wt (!) 400 lb 12.8 oz (181.8 kg)   BMI 64.69 kg/m   No Known Allergies  Physical Exam  Constitutional: She is oriented to person, place, and time. She appears well-developed and well-nourished.  HENT:  Head: Normocephalic and atraumatic.  Right Ear: There is drainage and tenderness.  Left Ear: There is drainage and tenderness.  Nose: Mucosal edema and rhinorrhea present. Right sinus exhibits maxillary sinus tenderness and frontal sinus tenderness. Left sinus exhibits maxillary sinus tenderness and frontal sinus tenderness.  Mouth/Throat: Oropharyngeal exudate and posterior oropharyngeal  erythema present.  Eyes: Conjunctivae and EOM are normal. Pupils are equal, round, and reactive to light.  Neck: Normal range of motion. Neck supple.  Cardiovascular: Normal rate, regular rhythm, normal heart sounds and intact distal pulses.   Pulmonary/Chest: Effort normal. She has wheezes in the right upper field and the left upper field.  Abdominal: Soft. Bowel sounds are normal.  Neurological: She is alert and oriented to person, place, and time. She has normal reflexes.  Skin: Skin is warm and dry. No rash noted.  Psychiatric: She has a normal mood and affect. Her behavior is normal. Judgment and thought content normal.        Assessment & Plan:   1. Acute bronchitis, unspecified organism - cefdinir (OMNICEF) 300 MG capsule; Take 1 capsule (300 mg total) by mouth 2 (two) times daily. 1 po BID  Dispense: 20 capsule; Refill: 0 - predniSONE (STERAPRED UNI-PAK 21 TAB) 10 MG (21) TBPK tablet; As directed x 6 days  Dispense: 21 tablet; Refill: 0 - albuterol (PROVENTIL HFA;VENTOLIN HFA) 108 (90 Base) MCG/ACT inhaler; Inhale 2 puffs into the lungs every 6 (six) hours as needed for wheezing or shortness of breath.  Dispense: 1 Inhaler; Refill: 1 - HYDROcodone-homatropine (HYCODAN) 5-1.5 MG/5ML syrup; Take 5-10 mLs by mouth every 6 (six) hours as needed.  Dispense: 240 mL; Refill: 0  2. Cough - albuterol (PROVENTIL HFA;VENTOLIN HFA) 108 (90 Base) MCG/ACT inhaler; Inhale 2 puffs into the lungs every 6 (six) hours as needed for wheezing or shortness of breath.  Dispense: 1 Inhaler; Refill: 1 - HYDROcodone-homatropine (HYCODAN) 5-1.5 MG/5ML syrup; Take 5-10 mLs by mouth every 6 (six) hours as needed.  Dispense: 240 mL; Refill: 0   Continue all other maintenance medications as listed above.  Follow up plan: Follow-up as needed or worsening of symptoms. Call office for any issues.   Educational handout given for bronchitis  Remus LofflerAngel S. Litha Lamartina PA-C [facility] [address]  [place], [place] [id number] [phone]   05/29/2016, 8:17 AM

## 2016-06-13 ENCOUNTER — Encounter: Payer: Self-pay | Admitting: Physician Assistant

## 2016-07-11 ENCOUNTER — Encounter: Payer: Self-pay | Admitting: Family Medicine

## 2016-07-11 ENCOUNTER — Ambulatory Visit (INDEPENDENT_AMBULATORY_CARE_PROVIDER_SITE_OTHER): Payer: BLUE CROSS/BLUE SHIELD

## 2016-07-11 ENCOUNTER — Ambulatory Visit (INDEPENDENT_AMBULATORY_CARE_PROVIDER_SITE_OTHER): Payer: BLUE CROSS/BLUE SHIELD | Admitting: Family Medicine

## 2016-07-11 VITALS — BP 136/80 | HR 109 | Temp 96.5°F | Ht 66.0 in | Wt >= 6400 oz

## 2016-07-11 DIAGNOSIS — R05 Cough: Secondary | ICD-10-CM

## 2016-07-11 DIAGNOSIS — R059 Cough, unspecified: Secondary | ICD-10-CM

## 2016-07-11 DIAGNOSIS — R0982 Postnasal drip: Secondary | ICD-10-CM | POA: Diagnosis not present

## 2016-07-11 MED ORDER — ONDANSETRON HCL 4 MG PO TABS: 4.0000 mg | ORAL_TABLET | Freq: Three times a day (TID) | ORAL | 0 refills | Status: DC | PRN

## 2016-07-11 MED ORDER — BENZONATATE 100 MG PO CAPS: 100.0000 mg | ORAL_CAPSULE | Freq: Three times a day (TID) | ORAL | 0 refills | Status: DC | PRN

## 2016-07-11 MED ORDER — PREDNISONE 20 MG PO TABS: 40.0000 mg | ORAL_TABLET | Freq: Every day | ORAL | 0 refills | Status: DC

## 2016-07-11 NOTE — Progress Notes (Signed)
   HPI  Patient presents today here with cough and cold symptoms.  Patient's when she's had 5 days of symptoms including nasal congestion, cough, headache, and nausea.  Patient describes her cough as a tickle in her throat". She states she is breathing normally. She was better for 2-3 weeks after her last visit when she was treated for more of a dyspneic and cough episode.  She's tolerating food and fluids normally. She denies any chest pain or body aches. She denies fevers  PMH: Smoking status noted ROS: Per HPI  Objective: BP 136/80   Pulse (!) 109   Temp (!) 96.5 F (35.8 C) (Oral)   Ht 5\' 6"  (1.676 m)   Wt (!) 409 lb 3.2 oz (185.6 kg)   LMP 06/10/2016   BMI 66.05 kg/m  Gen: NAD, alert, cooperative with exam HEENT: NCAT, oropharynx clear, nares with swelling of turbinates bilaterally, TMs largely normal except for a small amount of clear fluid on the left with no loss of landmarks. CV: RRR, good S1/S2, no murmur Resp: CTABL, no wheezes, non-labored Ext: No edema, warm Neuro: Alert and oriented, No gross deficits  Assessment and plan:  # Cough, postnasal drip Likely viral cough largely from postnasal drip Recommended continue Flonase, discussed nasal saline rinses Tessalon Perles Short course of prednisone Avoiding antibiotics in this case as there is no clear bacterial infection With her cough I have checked a chest x-ray which is clear RTC with any concerns      Orders Placed This Encounter  Procedures  . DG Chest 2 View    Standing Status:   Future    Number of Occurrences:   1    Standing Expiration Date:   09/10/2017    Order Specific Question:   Reason for Exam (SYMPTOM  OR DIAGNOSIS REQUIRED)    Answer:   Cough, Eval for CAP    Order Specific Question:   Is the patient pregnant?    Answer:   No    Order Specific Question:   Preferred imaging location?    Answer:   Internal    Meds ordered this encounter  Medications  . benzonatate (TESSALON  PERLES) 100 MG capsule    Sig: Take 1 capsule (100 mg total) by mouth 3 (three) times daily as needed for cough.    Dispense:  20 capsule    Refill:  0  . predniSONE (DELTASONE) 20 MG tablet    Sig: Take 2 tablets (40 mg total) by mouth daily with breakfast. 2 po at sametime daily for 5 days    Dispense:  10 tablet    Refill:  0  . ondansetron (ZOFRAN) 4 MG tablet    Sig: Take 1 tablet (4 mg total) by mouth every 8 (eight) hours as needed for nausea or vomiting.    Dispense:  20 tablet    Refill:  0    Murtis SinkSam Bradshaw, MD [facility] 07/11/2016, 3:46 PM

## 2016-07-11 NOTE — Patient Instructions (Signed)
Great to see you!  Come back with any concerns  Start prednisone daily, use tessalon as needed.    Acute Bronchitis, Adult Acute bronchitis is when air tubes (bronchi) in the lungs suddenly get swollen. The condition can make it hard to breathe. It can also cause these symptoms:  A cough.  Coughing up clear, yellow, or green mucus.  Wheezing.  Chest congestion.  Shortness of breath.  A fever.  Body aches.  Chills.  A sore throat. Follow these instructions at home: Medicines  Take over-the-counter and prescription medicines only as told by your doctor.  If you were prescribed an antibiotic medicine, take it as told by your doctor. Do not stop taking the antibiotic even if you start to feel better. General instructions  Rest.  Drink enough fluids to keep your pee (urine) clear or pale yellow.  Avoid smoking and secondhand smoke. If you smoke and you need help quitting, ask your doctor. Quitting will help your lungs heal faster.  Use an inhaler, cool mist vaporizer, or humidifier as told by your doctor.  Keep all follow-up visits as told by your doctor. This is important. How is this prevented? To lower your risk of getting this condition again:  Wash your hands often with soap and water. If you cannot use soap and water, use hand sanitizer.  Avoid contact with people who have cold symptoms.  Try not to touch your hands to your mouth, nose, or eyes.  Make sure to get the flu shot every year. Contact a doctor if:  Your symptoms do not get better in 2 weeks. Get help right away if:  You cough up blood.  You have chest pain.  You have very bad shortness of breath.  You become dehydrated.  You faint (pass out) or keep feeling like you are going to pass out.  You keep throwing up (vomiting).  You have a very bad headache.  Your fever or chills gets worse. This information is not intended to replace advice given to you by your health care provider.  Make sure you discuss any questions you have with your health care provider. Document Released: 11/15/2007 Document Revised: 01/05/2016 Document Reviewed: 11/17/2015 Elsevier Interactive Patient Education  2017 [employer]

## 2016-07-20 ENCOUNTER — Encounter: Payer: Self-pay | Admitting: *Deleted

## 2016-08-04 ENCOUNTER — Ambulatory Visit: Payer: BLUE CROSS/BLUE SHIELD | Admitting: Nurse Practitioner

## 2016-08-07 ENCOUNTER — Encounter: Payer: Self-pay | Admitting: Nurse Practitioner

## 2016-09-15 ENCOUNTER — Other Ambulatory Visit: Payer: Self-pay | Admitting: Nurse Practitioner

## 2016-09-15 DIAGNOSIS — K219 Gastro-esophageal reflux disease without esophagitis: Secondary | ICD-10-CM

## 2016-09-22 ENCOUNTER — Ambulatory Visit (INDEPENDENT_AMBULATORY_CARE_PROVIDER_SITE_OTHER): Payer: BLUE CROSS/BLUE SHIELD | Admitting: Family Medicine

## 2016-09-22 ENCOUNTER — Encounter: Payer: Self-pay | Admitting: Family Medicine

## 2016-09-22 VITALS — BP 134/89 | HR 105 | Temp 97.2°F | Ht 66.0 in | Wt >= 6400 oz

## 2016-09-22 DIAGNOSIS — M722 Plantar fascial fibromatosis: Secondary | ICD-10-CM

## 2016-09-22 NOTE — Progress Notes (Signed)
BP 134/89   Pulse (!) 105   Temp 97.2 F (36.2 C) (Oral)   Ht  (1.676 m)   Wt (!) 408 lb (185.1 kg)   BMI 65.85 kg/m    Subjective:    Patient ID: Cassandra Tucker, female    DOB: 1990-09-08, 26 y.o.   MRN: [id number]  HPI: Cassandra Tucker is a 26 y.o. female presenting on 09/22/2016 for Left heel pain (began about one month ago)    Pt complains of Left foot pain that began one month ago. Patient states that pain is in the heel. Patient describes pain as 8/10 dull and worse with standing first thing in the morning or after prolonged sitting. Patient has tried Ibuprofen with minimal relief. Patient states she had plantar's fascitis three years ago that got better when she changed jobs.   Relevant past medical, surgical, family and social history reviewed and updated as indicated. Interim medical history since our last visit reviewed. Allergies and medications reviewed and updated.  Review of Systems  Constitutional: Negative.   HENT: Negative.   Eyes: Negative.   Respiratory: Negative.   Cardiovascular: Negative.   Gastrointestinal: Negative.   Endocrine: Negative.   Genitourinary: Negative.   Musculoskeletal:       Left heel pain  Skin: Negative.   Allergic/Immunologic: Negative.   Neurological: Negative.   Psychiatric/Behavioral: Negative.     Per HPI unless specifically indicated above     Objective:    BP 134/89   Pulse (!) 105   Temp 97.2 F (36.2 C) (Oral)   Ht  (1.676 m)   Wt (!) 408 lb (185.1 kg)   BMI 65.85 kg/m   Wt Readings from Last 3 Encounters:  09/22/16 (!) 408 lb (185.1 kg)  07/11/16 (!) 409 lb 3.2 oz (185.6 kg)  05/26/16 (!) 400 lb 12.8 oz (181.8 kg)    Physical Exam  Constitutional: She is oriented to person, place, and time. She appears well-developed and well-nourished. No distress.  HENT:  Head: Normocephalic and atraumatic.  Eyes: Conjunctivae and EOM are normal. Pupils are equal, round, and reactive to light.  Neck:  Normal range of motion. Neck supple.  Cardiovascular: Normal rate, regular rhythm, normal heart sounds and intact distal pulses.  Exam reveals no gallop and no friction rub.   No murmur heard. Pulmonary/Chest: Effort normal and breath sounds normal.  Abdominal: Soft.  Musculoskeletal: Normal range of motion.  No tenderness of either foot with palpation. No skin breakdown, color changes, warmth or abnormalities. Capillary refill and sensations intact bilaterally.  Neurological: She is alert and oriented to person, place, and time.  Skin: Skin is warm and dry.  Psychiatric: She has a normal mood and affect. Her behavior is normal.      Assessment & Plan:   Problem List Items Addressed This Visit    None    Visit Diagnoses    Plantar fasciitis of left foot    -  Primary   Patient has opted to go with conservative measures at this point using anti-inflammatories and icing, return if she wants an injection       Follow up plan: Return if symptoms worsen or fail to improve.  Counseling provided for all of the vaccine components No orders of the defined types were placed in this encounter.  Patient seen and examined with Harlene Salts, PA student. Agree with assessment and plan above, no changes from this, patient TO go with conservative measures. We will  continue to monitor and if it worsens she will let us know.  Arville Care, MD [facility] 09/22/2016, 2:08 PM

## 2016-10-04 ENCOUNTER — Other Ambulatory Visit: Payer: Self-pay | Admitting: *Deleted

## 2016-10-04 DIAGNOSIS — K219 Gastro-esophageal reflux disease without esophagitis: Secondary | ICD-10-CM

## 2016-10-04 MED ORDER — OMEPRAZOLE 40 MG PO CPDR: 40.0000 mg | DELAYED_RELEASE_CAPSULE | Freq: Every day | ORAL | 0 refills | Status: DC

## 2016-11-14 ENCOUNTER — Encounter: Payer: Self-pay | Admitting: Family Medicine

## 2016-11-14 ENCOUNTER — Ambulatory Visit (INDEPENDENT_AMBULATORY_CARE_PROVIDER_SITE_OTHER): Payer: BLUE CROSS/BLUE SHIELD | Admitting: Family Medicine

## 2016-11-14 VITALS — BP 170/89 | HR 94 | Temp 97.6°F | Ht 66.0 in | Wt 398.6 lb

## 2016-11-14 DIAGNOSIS — E669 Obesity, unspecified: Secondary | ICD-10-CM

## 2016-11-14 DIAGNOSIS — Z0000 Encounter for general adult medical examination without abnormal findings: Secondary | ICD-10-CM | POA: Diagnosis not present

## 2016-11-14 DIAGNOSIS — E119 Type 2 diabetes mellitus without complications: Secondary | ICD-10-CM

## 2016-11-14 DIAGNOSIS — I10 Essential (primary) hypertension: Secondary | ICD-10-CM

## 2016-11-14 LAB — BAYER DCA HB A1C WAIVED: HB A1C: 6.9 % (ref <7.0)

## 2016-11-14 NOTE — Progress Notes (Signed)
   HPI  Patient presents today here today for annual physical exam  Patient is very concerned about her weight, he recently tried to go to a bariatric clinic which turns out does not take insurance. She is very interested in bariatric surgery. She's interested in the toes up. She does have diabetes, she's cut back on carbohydrate intake. She does not tolerate metformin due to severe nausea and vomiting.  Patient states that her heart rate is often elevated.  Low pressure at home runs in the 120s over 80s most often. No chest pain or shortness of breath. , To the doctor makes her very nervous.  Good medication compliance of hypertension medications. Patient does have intermittent leg swelling helped by HCTZ most of time.   PMH: Smoking status noted ROS: Per HPI  Objective: BP (!) 170/89   Pulse 94   Temp 97.6 F (36.4 C) (Oral)   Ht '5\' 6"'$  (1.676 m)   Wt (!) 398 lb 9.6 oz (180.8 kg)   LMP 10/31/2016   BMI 64.34 kg/m  Gen: NAD, alert, cooperative with exam HEENT: NCAT, oropharynx clear CV: RRR, good S1/S2, no murmur Resp: CTABL, no wheezes, non-labored Abd: + Bowel sounds Ext: No edema, warm Neuro: Alert and oriented, No gross deficits  Assessment and plan:  # Annual physical exam Severe obesity, super obesity Discussed starting victoza to help with  diabetes plus obesity Refer to Gen surg and Dr. Leafy Ro   # Type 2 diabetes Previously controlled, A1c today Patient has decreased carbohydrate intake  # Retention Elevated today, initially severely elevated then improved slightly Continue lisinopril plus HCTZ, patient reports control blood pressures at home. One month follow-up with log    Orders Placed This Encounter  Procedures  . Bayer DCA Hb A1c Waived  . CMP14+EGFR  . Lipid panel  . TSH  . Amb Referral to Bariatric Surgery    Referral Priority:   Routine    Referral Type:   Consultation    Referred to Provider:   Starlyn Skeans, MD    Number of  Visits Requested:   1  . Ambulatory referral to General Surgery    Referral Priority:   Routine    Referral Type:   Surgical    Referral Reason:   Specialty Services Required    Requested Specialty:   General Surgery    Number of Visits Requested:   [place], MD [facility] 11/14/2016, 11:59 AM

## 2016-11-14 NOTE — Patient Instructions (Signed)
Great to see you!  Please keep a blood pressure log and come back in 1 month to discuss blood pressure.   Please make an appointment with out clinical pharmacist to consider starting victoza

## 2016-11-15 LAB — LIPID PANEL
CHOLESTEROL TOTAL: 202 mg/dL — AB (ref 100–199)
Chol/HDL Ratio: 5.3 ratio — ABNORMAL HIGH (ref 0.0–4.4)
HDL: 38 mg/dL — AB (ref >39)
LDL Calculated: 115 mg/dL — ABNORMAL HIGH (ref 0–99)
TRIGLYCERIDES: 244 mg/dL — AB (ref 0–149)
VLDL CHOLESTEROL CAL: 49 mg/dL — AB (ref 5–40)

## 2016-11-15 LAB — CMP14+EGFR
A/G RATIO: 1.5 (ref 1.2–2.2)
ALK PHOS: 57 IU/L (ref 39–117)
ALT: 25 IU/L (ref 0–32)
AST: 17 IU/L (ref 0–40)
Albumin: 4.2 g/dL (ref 3.5–5.5)
BILIRUBIN TOTAL: 0.7 mg/dL (ref 0.0–1.2)
BUN/Creatinine Ratio: 17 (ref 9–23)
BUN: 12 mg/dL (ref 6–20)
CHLORIDE: 98 mmol/L (ref 96–106)
CO2: 25 mmol/L (ref 18–29)
Calcium: 9.9 mg/dL (ref 8.7–10.2)
Creatinine, Ser: 0.7 mg/dL (ref 0.57–1.00)
GFR calc Af Amer: 139 mL/min/{1.73_m2} (ref >59)
GFR calc non Af Amer: 121 mL/min/{1.73_m2} (ref >59)
GLUCOSE: 121 mg/dL — AB (ref 65–99)
Globulin, Total: 2.8 g/dL (ref 1.5–4.5)
POTASSIUM: 4.4 mmol/L (ref 3.5–5.2)
Sodium: 139 mmol/L (ref 134–144)
TOTAL PROTEIN: 7 g/dL (ref 6.0–8.5)

## 2016-11-15 LAB — TSH: TSH: 3.28 u[IU]/mL (ref 0.450–4.500)

## 2016-11-16 ENCOUNTER — Telehealth: Payer: Self-pay | Admitting: Family Medicine

## 2016-11-16 NOTE — Telephone Encounter (Signed)
Please see other note

## 2016-11-16 NOTE — Telephone Encounter (Signed)
Patient is wanting to know if you could fill out FMLA papers and add extra days for her to be out? Since she will be going to neurologist appointments, and foot doctor

## 2016-11-16 NOTE — Telephone Encounter (Signed)
Yes, it is ok to allow 3 days per month for doctors appointments.    Murtis SinkSam Angee Gupton, MD [facility] 11/16/2016, 5:24 PM

## 2016-11-16 NOTE — Telephone Encounter (Signed)
Patient aware and will bring forms by.

## 2016-11-20 ENCOUNTER — Ambulatory Visit: Payer: BLUE CROSS/BLUE SHIELD | Admitting: Neurology

## 2016-11-23 DIAGNOSIS — Z0289 Encounter for other administrative examinations: Secondary | ICD-10-CM

## 2016-11-27 ENCOUNTER — Ambulatory Visit: Payer: BLUE CROSS/BLUE SHIELD | Admitting: Pharmacist

## 2016-11-28 ENCOUNTER — Ambulatory Visit: Payer: BLUE CROSS/BLUE SHIELD | Admitting: Adult Health

## 2016-11-29 ENCOUNTER — Encounter: Payer: Self-pay | Admitting: Adult Health

## 2016-12-04 ENCOUNTER — Ambulatory Visit (INDEPENDENT_AMBULATORY_CARE_PROVIDER_SITE_OTHER): Payer: BLUE CROSS/BLUE SHIELD | Admitting: Pharmacist

## 2016-12-04 ENCOUNTER — Encounter: Payer: Self-pay | Admitting: Pharmacist

## 2016-12-04 VITALS — BP 140/86 | HR 88 | Ht 66.0 in | Wt 399.0 lb

## 2016-12-04 DIAGNOSIS — E119 Type 2 diabetes mellitus without complications: Secondary | ICD-10-CM

## 2016-12-04 MED ORDER — PEN NEEDLES 32G X 4 MM MISC: 1.0000 | Freq: Every day | 1 refills | Status: DC

## 2016-12-04 MED ORDER — LIRAGLUTIDE 18 MG/3ML ~~LOC~~ SOPN: 1.2000 mg | PEN_INJECTOR | Freq: Every day | SUBCUTANEOUS | 1 refills | Status: DC

## 2016-12-04 NOTE — Patient Instructions (Signed)
Diabetes and Standards of Medical Care   Diabetes is complicated. You may find that your diabetes team includes a dietitian, nurse, diabetes educator, eye doctor, and more. To help everyone know what is going on and to help you get the care you deserve, the following schedule of care was developed to help keep you on track. Below are the tests, exams, vaccines, medicines, education, and plans you will need.  Blood Glucose Goals Prior to meals = 80 - 130 Within 2 hours of the start of a meal = less than 180  HbA1c test (goal is less than 7.0% - your last value was 6.9%) This test shows how well you have controlled your glucose over the past 2 to 3 months. It is used to see if your diabetes management plan needs to be adjusted.   It is performed at least 2 times a year if you are meeting treatment goals.  It is performed 4 times a year if therapy has changed or if you are not meeting treatment goals.  Blood pressure test  This test is performed at every routine medical visit. The goal is less than 140/90 mmHg for most people, but 130/80 mmHg in some cases. Ask your health care provider about your goal.  Dental exam  Follow up with the dentist regularly.  Eye exam  If you are diagnosed with type 1 diabetes as a child, get an exam upon reaching the age of 10 years or older and have had diabetes for 3 to 5 years. Yearly eye exams are recommended after that initial eye exam.  If you are diagnosed with type 1 diabetes as an adult, get an exam within 5 years of diagnosis and then yearly.  If you are diagnosed with type 2 diabetes, get an exam as soon as possible after the diagnosis and then yearly.  Foot care exam  Visual foot exams are performed at every routine medical visit. The exams check for cuts, injuries, or other problems with the feet.  A comprehensive foot exam should be done yearly. This includes visual inspection as well as assessing foot pulses and testing for loss of  sensation.  Check your feet nightly for cuts, injuries, or other problems with your feet. Tell your health care provider if anything is not healing.  Kidney function test (urine microalbumin)  This test is performed once a year.  Type 1 diabetes: The first test is performed 5 years after diagnosis.  Type 2 diabetes: The first test is performed at the time of diagnosis.  A serum creatinine and estimated glomerular filtration rate (eGFR) test is done once a year to assess the level of chronic kidney disease (CKD), if present.  Lipid profile (cholesterol, HDL, LDL, triglycerides)  Performed every 5 years for most people.  The goal for LDL is less than 100 mg/dL. If you are at high risk, the goal is less than 70 mg/dL.  The goal for HDL is 40 mg/dL to 50 mg/dL for men and 50 mg/dL to 60 mg/dL for women. An HDL cholesterol of 60 mg/dL or higher gives some protection against heart disease.  The goal for triglycerides is less than 150 mg/dL.  Influenza vaccine, pneumococcal vaccine, and hepatitis B vaccine  The influenza vaccine is recommended yearly.  The pneumococcal vaccine is generally given once in a lifetime. However, there are some instances when another vaccination is recommended. Check with your health care provider.  The hepatitis B vaccine is also recommended for adults with diabetes.    Diabetes self-management education  Education is recommended at diagnosis and ongoing as needed.  Treatment plan  Your treatment plan is reviewed at every medical visit.  Document Released: 03/26/2009 Document Revised: 01/29/2013 Document Reviewed: 10/29/2012 Exit[employer] Patient Information 2014 [employer].   

## 2016-12-04 NOTE — Progress Notes (Signed)
Patient ID: Cassandra Tucker, female   DOB: 01-03-1991, 26 y.o.   MRN: [id number]   Subjective:    Cassandra Tucker is a 26 y.o. female who presents for an initial evaluation of Type 2 diabetes mellitus and obesity.   Mrs. Cassandra Tucker is morbidly obese.  Co-morbid conditions include GERD, sleep apnea, type 2 DM and HTN.  She had met with surgeon about bariatric surgery several years ago but cost was too high.  He PCP has set up another referral to re-evaluation.  Patient is very interested in bariatric surgery but would like to start losing weight sooner. She has tried self directed dieting in past without much success.  HTN prevents use of many weight loss medications.   Current diabetic medications include none.   Family history of DM - paternal GM  Current monitoring regimen: home blood tests - 1 times weekly Home blood sugar records: usually 130 and 150 Any episodes of hypoglycemia? no  Known diabetic complications: none Cardiovascular risk factors: diabetes mellitus, dyslipidemia, hypertension and obesity (BMI >= 30 kg/m2) Eye exam current (within one year): no Weight trend: stable Prior visit with CDE: no Current diet: Breakfast - biscuit from [employer]   Lunch - leftovers or skips  Supper - meat and 2 sides;   Snacks - chips  Beverages - water, 1 soda per day - regular;  Current exercise: none Medication Compliance?  Yes   Is She on ACE inhibitor or angiotensin II receptor blocker?  Yes  lisinopril (Prinivil)    The following portions of the patient's history were reviewed and updated as appropriate: allergies, current medications, past family history, past medical history, past social history, past surgical history and problem list.    Objective:    BP 140/86   Pulse 88   Ht _0  (1.676 m)   Wt (!) 399 lb (181 kg)   BMI 64.40 kg/m    Last A1c was 6.9% (11/14/2016)  BMP Latest Ref Rng & Units 11/14/2016 04/21/2016 01/07/2016  Glucose 65 - 99 mg/dL 121(H) 103(H)  144(H)  BUN 6 - 20 mg/dL _1 Creatinine 0.57 - 1.00 mg/dL 0.70 0.72 0.67  BUN/Creat Ratio 9 - _2 Sodium 134 - 144 mmol/L 139 143 140  Potassium 3.5 - 5.2 mmol/L 4.4 4.4 4.5  Chloride 96 - 106 mmol/L 98 99 99  CO2 18 - 29 mmol/L _3 Calcium 8.7 - 10.2 mg/dL 9.9 9.9 10.0      Assessment:    Diabetes Mellitus type II, under good control.   Obesity - morbid with many comorbid conditions HTN - improved but still elevated   Plan:    1.  Rx changes: start Victoza 0.52m SQ QD for 1 week, then increase to 1.23mqd (plant to increase to 1.8m18md if tolerated) - patient was given sample in office today and she administered first injections. Verified insurance coverage of tier 3 ($20/month) this was acceptable to patient. 2.  Education: Reviewed 'ABCs' of diabetes management (respective goals in parentheses):  A1C (<7), blood pressure (<130/80), and cholesterol (LDL <100). 3. Recommended urine microalbumin today - patient was unable to void.      Recminded about PAP - already has up coming appt. 4. CHO counting diet discussed.  Reviewed CHO amount in various foods and how to read nutrition labels.  Discussed recommended serving sizes. Discussed mindful eating and using smaller plates, bowl and cup to help decrease portion sizes. Discussed healthier  snacks and to stop all sugar containing beverages.   Also recommended 3 small meals per day with 2 to 3 snacks less than 100 calories. 5.  Recommend check BG 1  time a day 6.  Recommended increase physical activity - goal is 150 minutes per week 7. Follow up: 1 month  with PCP

## 2016-12-23 ENCOUNTER — Other Ambulatory Visit: Payer: Self-pay | Admitting: Nurse Practitioner

## 2016-12-23 DIAGNOSIS — K219 Gastro-esophageal reflux disease without esophagitis: Secondary | ICD-10-CM

## 2016-12-25 ENCOUNTER — Ambulatory Visit: Payer: BLUE CROSS/BLUE SHIELD | Admitting: Neurology

## 2016-12-29 ENCOUNTER — Ambulatory Visit (INDEPENDENT_AMBULATORY_CARE_PROVIDER_SITE_OTHER): Payer: BLUE CROSS/BLUE SHIELD | Admitting: Family Medicine

## 2016-12-29 ENCOUNTER — Encounter: Payer: Self-pay | Admitting: Family Medicine

## 2016-12-29 ENCOUNTER — Ambulatory Visit: Payer: BLUE CROSS/BLUE SHIELD | Admitting: Family Medicine

## 2016-12-29 VITALS — BP 150/100 | HR 123 | Temp 97.5°F | Ht 66.0 in | Wt >= 6400 oz

## 2016-12-29 DIAGNOSIS — I10 Essential (primary) hypertension: Secondary | ICD-10-CM | POA: Diagnosis not present

## 2016-12-29 DIAGNOSIS — E119 Type 2 diabetes mellitus without complications: Secondary | ICD-10-CM

## 2016-12-29 DIAGNOSIS — E669 Obesity, unspecified: Secondary | ICD-10-CM | POA: Diagnosis not present

## 2016-12-29 MED ORDER — LIRAGLUTIDE 18 MG/3ML ~~LOC~~ SOPN: 1.8000 mg | PEN_INJECTOR | Freq: Every day | SUBCUTANEOUS | 1 refills | Status: DC

## 2016-12-29 NOTE — Progress Notes (Signed)
   HPI  Patient presents today to discuss obesity, hypertension, and diabetes.  Patient is tolerating victoza well. She did have transient nausea which has resolved  Her blood pressure has ranged 120-143 systolic at home, averages have been 120s over 80s. No headache, chest pain, dyspnea. Patient has good medication compliance.  Obesity Patient is very interested in bariatric surgery and losing weight. She's had multiple attempts at weight loss. Today we have written a letter for medical necessity for very check surgery for her.  PMH: Smoking status noted ROS: Per HPI  Objective: BP (!) 150/100   Pulse (!) 123   Temp (!) 97.5 F (36.4 C) (Oral)   Ht 5\' 6"  (1.676 m)   Wt (!) 401 lb 3.2 oz (182 kg)   BMI 64.76 kg/m  Gen: NAD, alert, cooperative with exam HEENT: NCAT, EOMI, PERRL CV: RRR, good S1/S2, no murmur Resp: CTABL, no wheezes, non-labored Abd: SNTND, BS present, no guarding or organomegaly Ext: No edema, warm Neuro: Alert and oriented, No gross deficits  Assessment and plan:  # Obesity Stable, no improvement with victoza Letter written for medical necessity for bariatric surgery  # Type 2 diabetes Tolerating victoza well, start 1.8 mg daily Clinically  stable, controlled  # Hypertension Well-controlled at home, continue current medications Slightly elevated here due to anxiety likely    Meds ordered this encounter  Medications  . liraglutide 18 MG/3ML SOPN    Sig: Inject 0.3 mLs (1.8 mg total) into the skin daily.    Dispense:  2 pen    Refill:  1    Murtis SinkSam Bradshaw, MD [facility] 12/29/2016, 5:21 PM

## 2016-12-29 NOTE — Patient Instructions (Signed)
Great to see you!   

## 2017-01-08 ENCOUNTER — Other Ambulatory Visit: Payer: Self-pay | Admitting: Pharmacist

## 2017-01-10 ENCOUNTER — Encounter: Payer: Self-pay | Admitting: Neurology

## 2017-01-10 ENCOUNTER — Ambulatory Visit (INDEPENDENT_AMBULATORY_CARE_PROVIDER_SITE_OTHER): Payer: BLUE CROSS/BLUE SHIELD | Admitting: Neurology

## 2017-01-10 VITALS — BP 135/86 | HR 98 | Ht 66.0 in | Wt >= 6400 oz

## 2017-01-10 DIAGNOSIS — G932 Benign intracranial hypertension: Secondary | ICD-10-CM

## 2017-01-10 NOTE — Patient Instructions (Signed)
Remember to drink plenty of fluid, eat healthy meals and do not skip any meals. Try to eat protein with a every meal and eat a healthy snack such as fruit or nuts in between meals. Try to keep a regular sleep-wake schedule and try to exercise daily, particularly in the form of walking, 20-30 minutes a day, if you can.   As far as your medications are concerned, I would like to suggest: Continue current medications  I would like to see you back in 6 months, sooner if we need to. Please call us with any interim questions, concerns, problems, updates or refill requests.   Our phone number is [phone]. We also have an after hours call service for urgent matters and there is a physician on-call for urgent questions. For any emergencies you know to call 911 or go to the nearest emergency room   Idiopathic Intracranial Hypertension Idiopathic intracranial hypertension (IIH) is a condition that increases pressure around the brain. The fluid that surrounds the brain and spinal cord (cerebrospinal fluid, CSF) increases and causes the pressure. Idiopathic means that the cause of this condition is not known. IIH affects the brain and spinal cord (is a neurological disorder). If this condition is not treated, it can cause vision loss or blindness. What increases the risk? You are more likely to develop this condition if:  You are severely overweight (obese).  You are a woman who has not gone through menopause.  You take certain medicines, such as birth control or steroids.  What are the signs or symptoms? Symptoms of IIH include:  Headaches. This is the most common symptom.  Pain in the shoulders or neck.  Nausea and vomiting.  A "rushing water" or pulsing sound within the ears (pulsatile tinnitus).  Double vision.  Blurred vision.  Brief episodes of complete vision loss.  How is this diagnosed? This condition may be diagnosed based on:  Your symptoms.  Your medical history.  CT  scan of the brain.  MRI of the brain.  Magnetic resonance venogram (MRV) to check veins in the brain.  Diagnostic lumbar puncture. This is a procedure to remove and examine a sample of cerebrospinal fluid. This procedure can determine whether too much fluid may be causing IIH.  A thorough eye exam to check for swelling or nerve damage in the eyes.  How is this treated? Treatment for this condition depends on your symptoms. The goal of treatment is to decrease the pressure around your brain. Common treatments include:  Medicines to decrease the production of spinal fluid and lower the pressure within your skull.  Medicines to prevent or treat headaches.  Surgery to place drains (shunts) in your brain to remove excess fluid.  Lumbar puncture to remove excess cerebrospinal fluid.  Follow these instructions at home:  If you are overweight or obese, work with your health care provider to lose weight.  Take over-the-counter and prescription medicines only as told by your health care provider.  Do not drive or use heavy machinery while taking medicines that can make you sleepy.  Keep all follow-up visits as told by your health care provider. This is important. Contact a health care provider if:  You have changes in your vision, such as: ? Double vision. ? Not being able to see colors (color vision). Get help right away if:  You have any of the following symptoms and they get worse or do not get better. ? Headaches. ? Nausea. ? Vomiting. ? Vision changes or difficulty  seeing. Summary  Idiopathic intracranial hypertension (IIH) is a condition that increases pressure around the brain. The cause is not known (is idiopathic).  The most common symptom of IIH is headaches.  Treatment may include medicines or surgery to relieve the pressure on your brain. This information is not intended to replace advice given to you by your health care provider. Make sure you discuss any questions  you have with your health care provider. Document Released: 08/07/2001 Document Revised: 04/19/2016 Document Reviewed: 04/19/2016 [employer] Interactive Patient Education  2017 [employer]

## 2017-01-10 NOTE — Progress Notes (Signed)
[facility]    Provider:  Dr Lucia Gaskins Referring Provider: [name], * Primary Care Physician:  [name], FNP  Interval history 01/10/2017: Patient has an appointment with the [facility]. Discussed weight loss. Her IIH is stable, she is doing well on the Diamox.She is also following Dr. Dione Booze with stable vision. Discussed weight loss and detail.  Interval history 05/17/2016: Patient with IIH. [facility]. Her primary care has been managing her blood pressure and also prescribed metformin. Diamox 3x a day and no headaches. She is following with Dr. Dione Booze.  She has lost 20 pounds. She is 400 pounds, discussed weight loss and the new [facility]  Interval History 02/07/2016: Sleep evaluation showed severest osa, associated with hypercapnia and hypoxia. Has been wearing her cpap for 3.5 months. She saw Dr. Dione Booze and scheduled for follow up with Dr. Dione Booze in ophthamology. Taking the diamox 3x a day without side effects. No headaches, maybe brief ones 1-2x a month for 30 minutes. No changes in vision. She has not taking the diamox in 1-2 weeks, discussed non compliance and risks of permanent blindness. Discuss weight loss and exercise and diet. Stressed need for medication compliance. She has not followed up in months with and missed her last appointment with Dr. Dione Booze. Discussed personal responsibility of her own healthcare.  Interval history:Patient denies any headaches. She had OSA in the past, last sleep eval 3 years ago. She has not used her cpap. Will refer to Dr. Vickey Huger for sleep study. LP OP was 32cm and MRi of the brain c/Cassandra IIH. She has not seen an Ophthalmologist will refer to Marchelle Gearing for eye eval and VF testing.   IMPRESSION: This MRI of the brain with and without contrast shows the following: 1. 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 Though nonspecific,  this can be seen with idiopathic intracranial hypertension (pseudotumor cerebri). 2. There is chronic sphenoid, left maxillary, right frontal and ethmoid sinusitis. 3. 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 4. Brain parenchyma is otherwise normal 5. There are no acute findings.  LP:  With the patient prone, the lower back was prepped with Betadine. 1% Lidocaine was used for local anesthesia. Lumbar puncture was performed at the L3-4 level using a 22 gauge needle with return of clear colorless CSF with an opening pressure of 32 cm water. 27.5 ml of CSF were obtained for laboratory studies. The patient tolerated the procedure well and there were no apparent Complications.  reviewed images and test results with patient and her mother. Explained next steps.   ZOX:WRUEAVWU M Tucker is a 26 y.o. 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 She has been having headaches for 8-9 days. She was diagnosed 3 years ago. She had lumbar puncture 3 years ago and it was 50. She stopped the medication about 2 years ago on her own because she was feeling fine. Hasn't had any problems until 2 weeks ago. She was having bad headaches, saw the doctor and said the swelling was back. The headaches are pressure all over. They are continuous. She has headaches when she wakes up. She has untreated sleep apnea. She can't tolerate the mask. Er started the medication. Had MRi of the brain 3 years ago. Headaches can be 10/10 pain, she also has shooting pain. She feels pressure in the ears. She has had episodic blurry vision.  She just started the medication prescribed in the ED. She was prescribed 125mg  pills and instructed to take 2.5 pills three times a day.   Reviewed notes, labs and imaging from outside physicians, which showed:  She was seen in the ED on 06/12/2015 for optic disk edema and headches. She was sent by her  optometrist after eye exam. She reported headaches for a week with dizziness and lightheadedness. She denied fever, chills, chest pain, shortness of breath, abdominal pain, N/V/D/C, numbness, weakness, paresthesia. She stopped her diamox years ago. She also reported non compliance with lisinopril. She was started on Lisinopril and diamox and discharged with OP neurology follow up.  Review of Systems: Patient complains of symptoms per HPI as well as the following symptoms: No CP, No SOB. Pertinent negatives per HPI. All others negative.   Social History   Social History  . Marital status: Single    Spouse name: N/A  . Number of children: 0  . Years of education: 12+   Occupational History  . [employer]    Social History Main Topics  . Smoking status: Never Smoker  . Smokeless tobacco: Never Used  . Alcohol use 0.0 oz/week     Comment: socially  . Drug use: No  . Sexual activity: Not on file   Other Topics Concern  . Not on file   Social History Narrative   Lives with grandparents   Caffeine use: Drinks coffee/tea/soda- 20oz per day (none after starting diamox)    Family History  Problem Relation Age of Onset  . Heart attack Mother   . Hypertension Mother   . Cancer Father   . Migraines Neg Hx     Past Medical History:  Diagnosis Date  . Ankle fracture, left 2000  . Arthritis    left wrist and left ankle  . Bronchitis   . Diabetes (HCC) 01/11/2016  . GERD (gastroesophageal reflux disease)   . Hypertension 2013    Past Surgical History:  Procedure Laterality Date  . FRACTURE SURGERY  2005   ankle  . FRACTURE SURGERY  2013   wrist (deteriorating bone)  . WRIST SURGERY  2015    Current Outpatient Prescriptions  Medication Sig Dispense Refill  . acetaZOLAMIDE (DIAMOX) 500 MG capsule Take 1 capsule (500 mg total) by mouth 3 (three) times daily. 90 capsule 11  . albuterol (PROVENTIL HFA;VENTOLIN HFA) 108 (90 Base) MCG/ACT inhaler Inhale 2 puffs into  the lungs every 6 (six) hours as needed for wheezing or shortness of breath. 1 Inhaler 1  . hydrochlorothiazide (HYDRODIURIL) 12.5 MG tablet Take 1 tablet (12.5 mg total) by mouth daily. 90 tablet 3  . ibuprofen (ADVIL,MOTRIN) 200 MG tablet Take 400 mg by mouth every 6 (six) hours as needed for headache or moderate pain.     . Insulin Pen Needle (PEN NEEDLES) 32G X 4 MM MISC 1 each by Does not apply route daily. 100 each 1  . liraglutide 18 MG/3ML SOPN Inject 0.3 mLs (1.8 mg total) into the skin daily. 2 pen 1  . lisinopril (PRINIVIL,ZESTRIL) 20 MG tablet Take 1 tablet (20 mg total) by mouth daily. 90 tablet 3  . omeprazole (PRILOSEC) 40 MG capsule TAKE 1 CAPSULE (40 MG TOTAL) BY MOUTH DAILY. 30 capsule 4  . VICTOZA 18 MG/3ML SOPN INJECT 0.2 MLS (1.2 MG TOTAL) INTO THE SKIN DAILY. 6 mL 1   No current facility-administered medications for this visit.     Allergies as of 01/10/2017  . (  No Known Allergies)    Vitals: BP 135/86   Pulse 98   Ht 5\' 6"  (1.676 m)   Wt (!) 403 lb 12.8 oz (183.2 kg)   BMI 65.18 kg/m  Last Weight:  Wt Readings from Last 1 Encounters:  01/10/17 (!) 403 lb 12.8 oz (183.2 kg)   Last Height:   Ht Readings from Last 1 Encounters:  01/10/17 5\' 6"  (1.676 m)     Speech: Speech is normal; fluent and spontaneous with normal comprehension.  Cognition: The patient is oriented to person, place, and time;  recent and remote memory intact;  language fluent;  normal attention, concentration,  fund of knowledge Cranial Nerves:  The pupils are equal, round, and reactive to light. Visual fields are full to finger confrontation. Stable papilledema. Extraocular movements are intact. Trigeminal sensation is intact and the muscles of mastication are normal. The face is symmetric. The palate elevates in the midline. Hearing intact. Voice is normal. Shoulder shrug is normal. The tongue has normal motion without fasciculations.    Coordination: No dysmetria  Gait: Wide-based due to large body habitus  Motor Observation: No asymmetry, no atrophy, and no involuntary movements noted. Tone: Normal muscle tone.  Posture: Posture is normal. normal erect  Strength: Strength is V/V in the upper and lower limbs.   Sensation: intact to LT  Reflex Exam:  DTR's: Deep tendon reflexes in the upper and lower extremities are normal bilaterally. Toes: The toes are downgoing bilaterally. Clonus: Clonus is absent.      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 Continue Diamox 500mg  tid Discussed critical need for weight loss: She has an appointment with a [facility].  Discussed that untreated IIH can cause permanent vision loss, stressed medication compliance and weight loss, if vision worsens call office or proceed to ED. Follow up with Dr. Dione BoozeGroat regularly  Naomie DeanAntonia Joy Haegele, MD  [facility] [address] [place], [place] [id number]  Phone [phone] Fax [phone]  A total of 15 minutes was spent face-to-face with this patient. Over half this time was spent on counseling patient on the IIH, obesity diagnosis and different diagnostic and therapeutic options available.

## 2017-01-18 ENCOUNTER — Other Ambulatory Visit: Payer: Self-pay | Admitting: Nurse Practitioner

## 2017-01-18 DIAGNOSIS — G2581 Restless legs syndrome: Secondary | ICD-10-CM

## 2017-01-18 NOTE — Telephone Encounter (Signed)
LMOV NTBS before next RF

## 2017-01-18 NOTE — Telephone Encounter (Signed)
Last refill without being seen 

## 2017-02-05 ENCOUNTER — Other Ambulatory Visit: Payer: Self-pay | Admitting: Nurse Practitioner

## 2017-02-05 DIAGNOSIS — I10 Essential (primary) hypertension: Secondary | ICD-10-CM

## 2017-02-06 ENCOUNTER — Other Ambulatory Visit: Payer: Self-pay | Admitting: Nurse Practitioner

## 2017-02-06 DIAGNOSIS — K219 Gastro-esophageal reflux disease without esophagitis: Secondary | ICD-10-CM

## 2017-02-08 ENCOUNTER — Encounter (INDEPENDENT_AMBULATORY_CARE_PROVIDER_SITE_OTHER): Payer: BLUE CROSS/BLUE SHIELD

## 2017-02-14 ENCOUNTER — Encounter (INDEPENDENT_AMBULATORY_CARE_PROVIDER_SITE_OTHER): Payer: Self-pay | Admitting: Family Medicine

## 2017-02-14 ENCOUNTER — Ambulatory Visit (INDEPENDENT_AMBULATORY_CARE_PROVIDER_SITE_OTHER): Payer: BLUE CROSS/BLUE SHIELD | Admitting: Family Medicine

## 2017-02-14 VITALS — BP 121/78 | HR 77 | Temp 98.0°F | Ht 66.0 in | Wt 387.0 lb

## 2017-02-14 DIAGNOSIS — R0602 Shortness of breath: Secondary | ICD-10-CM

## 2017-02-14 DIAGNOSIS — E119 Type 2 diabetes mellitus without complications: Secondary | ICD-10-CM

## 2017-02-14 DIAGNOSIS — Z6841 Body Mass Index (BMI) 40.0 and over, adult: Secondary | ICD-10-CM

## 2017-02-14 DIAGNOSIS — R5383 Other fatigue: Secondary | ICD-10-CM | POA: Diagnosis not present

## 2017-02-14 DIAGNOSIS — I10 Essential (primary) hypertension: Secondary | ICD-10-CM | POA: Diagnosis not present

## 2017-02-14 DIAGNOSIS — E669 Obesity, unspecified: Secondary | ICD-10-CM

## 2017-02-14 DIAGNOSIS — Z1331 Encounter for screening for depression: Secondary | ICD-10-CM

## 2017-02-14 DIAGNOSIS — Z0289 Encounter for other administrative examinations: Secondary | ICD-10-CM

## 2017-02-14 DIAGNOSIS — Z1389 Encounter for screening for other disorder: Secondary | ICD-10-CM

## 2017-02-14 DIAGNOSIS — IMO0001 Reserved for inherently not codable concepts without codable children: Secondary | ICD-10-CM

## 2017-02-14 NOTE — Progress Notes (Signed)
Office: [phone]  /  Fax: [phone]   Dear Dr. Lucia Gaskins,   Thank you for referring Cassandra Tucker to our clinic. The following note includes my evaluation and treatment recommendations.  HPI:   Chief Complaint: OBESITY    Cassandra Tucker has been referred by Desma Maxim B. Lucia Gaskins, MD for consultation regarding her obesity and obesity related comorbidities.    Cassandra Tucker (MR# [id number]) is a 26 y.o. female who presents on 02/14/2017 for obesity evaluation and treatment. Current BMI is Body mass index is 62.46 kg/m.Marland Kitchen Cassandra Tucker has been struggling with her weight for many years and has been unsuccessful in either losing weight, maintaining weight loss, or reaching her healthy weight goal.     Odessia attended our information session and states she is currently in the action stage of change and ready to dedicate time achieving and maintaining a healthier weight. Cassandra Tucker is interested in becoming our patient and working on intensive lifestyle modifications including (but not limited to) diet, exercise and weight loss.    Cassandra Tucker states her desired weight loss is 173 to 188 lbs she has been heavy most of  her life she started gaining weight gradually over time her heaviest weight ever was 425 lbs. she skips meals frequently she is frequently drinking liquids with calories she frequently makes poor food choices she frequently eats larger portions than normal    Cassandra Tucker feels her energy is lower than it should be. This has worsened with weight gain and has not worsened recently. Cassandra Tucker admits to daytime somnolence and  denies waking up still tired. Patient is at risk for obstructive sleep apnea. Patent has a history of symptoms of daytime Cassandra and hypertension. Patient generally gets 5 or 6 hours of sleep per night, and states they generally have restless sleep. Snoring is present. Apneic episodes are present. Epworth Sleepiness Score is 7  Dyspnea on  exertion Cassandra Tucker notes increasing shortness of breath with exercising and seems to be worsening over time with weight gain. She notes getting out of breath sooner with activity than she used to. This has not gotten worse recently. Cassandra Tucker denies orthopnea.  Diabetes II Cassandra Tucker has a diagnosis of diabetes type II. She is currently taking victoza 1.8 mg. Cassandra Tucker didn't tolerate metformin in the past. Cassandra Tucker states she is not checking BGs at home and denies any hypoglycemic episodes. Last A1c was at 6.9 She is attempting to work on intensive lifestyle modifications including diet, exercise, and weight loss to help control her blood glucose levels.  Hypertension Cassandra Tucker is a 26 y.o. female with hypertension and is currently on diuretics and lisinopril. She has a history of diabetes and Cassandra Tucker denies chest pain or headache currently. She is attempting weight loss to help control her blood pressure with the goal of decreasing her risk of heart attack and stroke. [place]roke[place] blood pressure is controlled today.   Depression Screen Cassandra Tucker (modified PHQ-9) score was  Depression screen PHQ 2/9 02/14/2017  Decreased Interest 2  Down, Depressed, Hopeless 2  PHQ - 2 Score 4  Altered sleeping 1  Tired, decreased energy 3  Change in appetite 2  Feeling bad or failure about yourself  3  Trouble concentrating 2  Moving slowly or fidgety/restless 2  Suicidal thoughts 1  PHQ-9 Score 18  Difficult doing work/chores Very difficult    ALLERGIES: No Known Allergies  MEDICATIONS: Current Outpatient Prescriptions on File Prior to Visit  Medication Sig Dispense Refill  .  acetaZOLAMIDE (DIAMOX) 500 MG capsule Take 1 capsule (500 mg total) by mouth 3 (three) times daily. (Patient taking differently: Take 500 mg by mouth 2 (two) times daily. ) 90 capsule 11  . albuterol (PROVENTIL HFA;VENTOLIN HFA) 108 (90 Base) MCG/ACT inhaler Inhale 2 puffs into the lungs every 6  (six) hours as needed for wheezing or shortness of breath. 1 Inhaler 1  . hydrochlorothiazide (HYDRODIURIL) 12.5 MG tablet Take 1 tablet (12.5 mg total) by mouth daily. 90 tablet 3  . lisinopril (PRINIVIL,ZESTRIL) 20 MG tablet Take 1 tablet (20 mg total) by mouth daily. 90 tablet 3  . omeprazole (PRILOSEC) 40 MG capsule TAKE 1 CAPSULE (40 MG TOTAL) BY MOUTH DAILY. 30 capsule 4  . VICTOZA 18 MG/3ML SOPN INJECT 0.2 MLS (1.2 MG TOTAL) INTO THE SKIN DAILY. 6 mL 1  . ibuprofen (ADVIL,MOTRIN) 200 MG tablet Take 400 mg by mouth every 6 (six) hours as needed for headache or moderate pain.     . Insulin Pen Needle (PEN NEEDLES) 32G X 4 MM MISC 1 each by Does not apply route daily. (Patient not taking: Reported on 02/14/2017) 100 each 1  . lisinopril (PRINIVIL,ZESTRIL) 40 MG tablet TAKE 1 TABLET (40 MG TOTAL) BY MOUTH DAILY. (Patient not taking: Reported on 02/14/2017) 90 tablet 1  . pramipexole (MIRAPEX) 0.75 MG tablet TAKE 1 TABLET (0.75 MG TOTAL) BY MOUTH 3 (THREE) TIMES DAILY. (Patient not taking: Reported on 02/14/2017) 90 tablet 0   No current facility-administered medications on file prior to visit.     PAST MEDICAL HISTORY: Past Medical History:  Diagnosis Date  . Ankle fracture, left 2000  . Anxiety   . Arthritis    left wrist and left ankle  . Back pain   . Bronchitis   . Diabetes (HCC) 01/11/2016  . GERD (gastroesophageal reflux disease)   . HLD (hyperlipidemia)   . Hypertension 2013  . Intracranial hypertension    psuedotumor cebrum  . Joint pain   . Kienbock's disease   . Leg edema   . OSA (obstructive sleep apnea)   . Papilledema   . RLS (restless legs syndrome)   . Sinus tachycardia     PAST SURGICAL HISTORY: Past Surgical History:  Procedure Laterality Date  . FRACTURE SURGERY  2005   ankle  . FRACTURE SURGERY  2013   wrist (deteriorating bone)  . WRIST SURGERY  2015    SOCIAL HISTORY: Social History  Substance Use Topics  . Smoking status: Never Smoker  . Smokeless  tobacco: Never Used  . Alcohol use 0.0 oz/week     Comment: socially    FAMILY HISTORY: Family History  Problem Relation Age of Onset  . Heart attack Mother   . Hypertension Mother   . Obesity Mother   . Depression Mother   . Anxiety disorder Mother   . Bipolar disorder Mother   . Cancer Father   . Migraines Neg Hx     ROS: Review of Systems  Constitutional: Positive for malaise/Cassandra.  Respiratory: Positive for shortness of breath (on exertion).   Cardiovascular: Negative for chest pain and orthopnea.  Neurological: Negative for headaches.  Endo/Heme/Allergies:       Negative hypoglycemia    PHYSICAL EXAM: Blood pressure 121/78, pulse 77, temperature 98 F (36.7 C), temperature source Oral, height 5\' 6"  (1.676 m), weight (!) 387 lb (175.5 kg), last menstrual period 01/23/2017, SpO2 97 %. Body mass index is 62.46 kg/m. Physical Exam  Constitutional: She is oriented to person,  place, and time. She appears well-developed and well-nourished.  Cardiovascular: Normal rate.   Pulmonary/Chest: Effort normal.  Musculoskeletal: Normal range of motion.  Neurological: She is oriented to person, place, and time.  Skin: Skin is warm and dry.  Psychiatric: She has a normal Tucker and affect. Her behavior is normal.  Vitals reviewed.   RECENT LABS AND TESTS: BMET    Component Value Date/Time   NA 139 11/14/2016 1127   K 4.4 11/14/2016 1127   CL 98 11/14/2016 1127   CO2 25 11/14/2016 1127   GLUCOSE 121 (H) 11/14/2016 1127   GLUCOSE 89 07/13/2015 2052   BUN 12 11/14/2016 1127   CREATININE 0.70 11/14/2016 1127   CALCIUM 9.9 11/14/2016 1127   GFRNONAA 121 11/14/2016 1127   GFRAA 139 11/14/2016 1127   No results found for: HGBA1C No results found for: INSULIN CBC    Component Value Date/Time   WBC 9.6 02/07/2016 0923   RBC 4.87 02/07/2016 0923   HGB 11.8 02/07/2016 0923   HCT 37.5 02/07/2016 0923   PLT 252 02/07/2016 0923   MCV 77 (L) 02/07/2016 0923   MCH 24.2 (L)  02/07/2016 0923   MCHC 31.5 02/07/2016 0923   RDW 16.0 (H) 02/07/2016 0923   Iron/TIBC/Ferritin/ %Sat    Component Value Date/Time   FERRITIN 45 02/07/2016 0923   Lipid Panel     Component Value Date/Time   CHOL 202 (H) 11/14/2016 1127   TRIG 244 (H) 11/14/2016 1127   HDL 38 (L) 11/14/2016 1127   CHOLHDL 5.3 (H) 11/14/2016 1127   LDLCALC 115 (H) 11/14/2016 1127   Hepatic Function Panel     Component Value Date/Time   PROT 7.0 11/14/2016 1127   ALBUMIN 4.2 11/14/2016 1127   AST 17 11/14/2016 1127   ALT 25 11/14/2016 1127   ALKPHOS 57 11/14/2016 1127   BILITOT 0.7 11/14/2016 1127      Component Value Date/Time   TSH 3.280 11/14/2016 1127   TSH 2.510 01/07/2016 1140   TSH 3.440 06/16/2015 0946    ECG  shows NSR with a rate of 95 BPM INDIRECT CALORIMETER done today shows a VO2 of 364 and a REE of 2537.  Her calculated basal metabolic rate is 1610 thus her basal metabolic rate is better than expected.    ASSESSMENT AND PLAN: Other Cassandra - Plan: EKG 12-Lead, VITAMIN D 25 Hydroxy (Vit-D Deficiency, Fractures), TSH, T4, free, T3, Lipid Panel With LDL/HDL Ratio, Vitamin B12, CBC With Differential, Folate  Shortness of breath on exertion  Type 2 diabetes mellitus without complication, without long-term current use of insulin (HCC) - Plan: Microalbumin / creatinine urine ratio, Comprehensive metabolic panel, Hemoglobin A1c, Insulin, random  Essential hypertension  Depression screening  Class 3 obesity with serious comorbidity and body mass index (BMI) of 60.0 to 69.9 in adult, unspecified obesity type (HCC)  PLAN: Cassandra Genifer was informed that her Cassandra may be related to obesity, depression or many other causes. Labs will be ordered, and in the meanwhile Vanetta has agreed to work on diet, exercise and weight loss to help with Cassandra. Proper sleep hygiene was discussed including the need for 7-8 hours of quality sleep each night. A sleep study was not ordered  based on symptoms and Epworth score.  Dyspnea on exertion Deloyce's shortness of breath appears to be obesity related and exercise induced. She has agreed to work on weight loss and gradually increase exercise to treat her exercise induced shortness of breath. If Shaelee follows our  instructions and loses weight without improvement of her shortness of breath, we will plan to refer to pulmonology. We will monitor this condition regularly. Tyasia agrees to this plan.  Depression Screen Kimya had a strongly positive depression screening. Depression is commonly associated with obesity and often results in emotional eating behaviors. We will monitor this closely and work on CBT to help improve the non-hunger eating patterns. Referral to Psychology may be required if no improvement is seen as she continues in our clinic.  Diabetes II Jahliyah has been given extensive diabetes education by myself today including ideal fasting and post-prandial blood glucose readings, individual ideal Hgb A1c goals  and hypoglycemia prevention. We discussed the importance of good blood sugar control to decrease the likelihood of diabetic complications such as nephropathy, neuropathy, limb loss, blindness, coronary artery disease, and death. We discussed the importance of intensive lifestyle modification including diet, exercise and weight loss as the first line treatment for diabetes. Kiwanna agrees to continue her diabetes medications and will follow up at the agreed upon time.  Hypertension We discussed sodium restriction, working on healthy weight loss, and a regular exercise program as the means to achieve improved blood pressure control. Tasnia agreed with this plan and agreed to follow up as directed. We will continue to monitor her blood pressure as well as her progress with the above lifestyle modifications. She will continue victoza as prescribed and will watch for signs of hypotension as she continues her  lifestyle modifications.  Obesity Jatoria is currently in the action stage of change and her goal is to continue with weight loss efforts. I recommend Bobette begin the structured treatment plan as follows:  She has agreed to follow the Category 3 plan +300 calories Mayela has been instructed to eventually work up to a goal of 150 minutes of combined cardio and strengthening exercise per week for weight loss and overall health benefits. We discussed the following Behavioral Modification Strategies today: increasing lean protein intake and decreasing simple carbohydrates    She was informed of the importance of frequent follow up visits to maximize her success with intensive lifestyle modifications for her multiple health conditions. She was informed we would discuss her lab results at her next visit unless there is a critical issue that needs to be addressed sooner. Merle agreed to keep her next visit at the agreed upon time to discuss these results.  I, Nevada Crane, am acting as transcriptionist for  Quillian Quince, MD  I have reviewed the above documentation for accuracy and completeness, and I agree with the above. -Quillian Quince, MD    OBESITY BEHAVIORAL INTERVENTION VISIT  Today's visit was # 1 out of 22.  Starting weight: 387 lbs Starting date: 02/14/17 Today's weight : 387 lbs  Today's date: 02/14/2017 Total lbs lost to date: 0 (Patients must lose 7 lbs in the first 6 months to continue with counseling)   ASK: We discussed the diagnosis of obesity with Elinor M Tucker today and Ramyah agreed to give [place] permission to discuss obesity behavioral modification therapy today.  ASSESS: Earnestene has the diagnosis of obesity and her BMI today is 23.49 Disha is in the action stage of change   ADVISE: Briahnna was educated on the multiple health risks of obesity as well as the benefit of weight loss to improve her health. She was advised of the need for long term  treatment and the importance of lifestyle modifications.  AGREE: Multiple dietary modification options and treatment options were discussed  and  Kennady agreed to follow the Category 3 plan +300 calories We discussed the following Behavioral Modification Strategies today: increasing lean protein intake and decreasing simple carbohydrates

## 2017-02-15 LAB — COMPREHENSIVE METABOLIC PANEL
A/G RATIO: 1.6 (ref 1.2–2.2)
ALBUMIN: 4.2 g/dL (ref 3.5–5.5)
ALT: 28 IU/L (ref 0–32)
AST: 17 IU/L (ref 0–40)
Alkaline Phosphatase: 53 IU/L (ref 39–117)
BUN / CREAT RATIO: 19 (ref 9–23)
BUN: 12 mg/dL (ref 6–20)
Bilirubin Total: 0.6 mg/dL (ref 0.0–1.2)
CALCIUM: 9.5 mg/dL (ref 8.7–10.2)
CO2: 23 mmol/L (ref 20–29)
Chloride: 104 mmol/L (ref 96–106)
Creatinine, Ser: 0.63 mg/dL (ref 0.57–1.00)
GFR, EST AFRICAN AMERICAN: 144 mL/min/{1.73_m2} (ref >59)
GFR, EST NON AFRICAN AMERICAN: 125 mL/min/{1.73_m2} (ref >59)
GLOBULIN, TOTAL: 2.7 g/dL (ref 1.5–4.5)
Glucose: 92 mg/dL (ref 65–99)
POTASSIUM: 4.3 mmol/L (ref 3.5–5.2)
SODIUM: 140 mmol/L (ref 134–144)
Total Protein: 6.9 g/dL (ref 6.0–8.5)

## 2017-02-15 LAB — CBC WITH DIFFERENTIAL
BASOS: 0 %
Basophils Absolute: 0 10*3/uL (ref 0.0–0.2)
EOS (ABSOLUTE): 0.5 10*3/uL — ABNORMAL HIGH (ref 0.0–0.4)
EOS: 5 %
HEMATOCRIT: 39.2 % (ref 34.0–46.6)
Hemoglobin: 12.6 g/dL (ref 11.1–15.9)
Immature Grans (Abs): 0 10*3/uL (ref 0.0–0.1)
Immature Granulocytes: 0 %
LYMPHS ABS: 3.1 10*3/uL (ref 0.7–3.1)
Lymphs: 32 %
MCH: 25.8 pg — ABNORMAL LOW (ref 26.6–33.0)
MCHC: 32.1 g/dL (ref 31.5–35.7)
MCV: 80 fL (ref 79–97)
MONOS ABS: 0.6 10*3/uL (ref 0.1–0.9)
Monocytes: 6 %
Neutrophils Absolute: 5.4 10*3/uL (ref 1.4–7.0)
Neutrophils: 57 %
RBC: 4.88 x10E6/uL (ref 3.77–5.28)
RDW: 15.2 % (ref 12.3–15.4)
WBC: 9.6 10*3/uL (ref 3.4–10.8)

## 2017-02-15 LAB — LIPID PANEL WITH LDL/HDL RATIO
CHOLESTEROL TOTAL: 185 mg/dL (ref 100–199)
HDL: 32 mg/dL — AB (ref >39)
LDL Calculated: 119 mg/dL — ABNORMAL HIGH (ref 0–99)
LDl/HDL Ratio: 3.7 ratio — ABNORMAL HIGH (ref 0.0–3.2)
Triglycerides: 172 mg/dL — ABNORMAL HIGH (ref 0–149)
VLDL CHOLESTEROL CAL: 34 mg/dL (ref 5–40)

## 2017-02-15 LAB — VITAMIN D 25 HYDROXY (VIT D DEFICIENCY, FRACTURES): Vit D, 25-Hydroxy: 23.4 ng/mL — ABNORMAL LOW (ref 30.0–100.0)

## 2017-02-15 LAB — FOLATE: Folate: 5 ng/mL (ref >3.0)

## 2017-02-15 LAB — VITAMIN B12: Vitamin B-12: 368 pg/mL (ref 232–1245)

## 2017-02-15 LAB — T4, FREE: Free T4: 1.22 ng/dL (ref 0.82–1.77)

## 2017-02-15 LAB — HEMOGLOBIN A1C
Est. average glucose Bld gHb Est-mCnc: 126 mg/dL
HEMOGLOBIN A1C: 6 % — AB (ref 4.8–5.6)

## 2017-02-15 LAB — MICROALBUMIN / CREATININE URINE RATIO
Creatinine, Urine: 272 mg/dL
Microalb/Creat Ratio: 5.7 mg/g{creat} (ref 0.0–30.0)
Microalbumin, Urine: 15.5 ug/mL

## 2017-02-15 LAB — INSULIN, RANDOM: INSULIN: 60.5 u[IU]/mL — ABNORMAL HIGH (ref 2.6–24.9)

## 2017-02-15 LAB — TSH: TSH: 1.65 u[IU]/mL (ref 0.450–4.500)

## 2017-02-15 LAB — T3: T3 TOTAL: 140 ng/dL (ref 71–180)

## 2017-02-18 ENCOUNTER — Other Ambulatory Visit: Payer: Self-pay | Admitting: Neurology

## 2017-02-23 ENCOUNTER — Other Ambulatory Visit (HOSPITAL_COMMUNITY): Payer: Self-pay | Admitting: Surgery

## 2017-02-26 ENCOUNTER — Ambulatory Visit (INDEPENDENT_AMBULATORY_CARE_PROVIDER_SITE_OTHER): Payer: BLUE CROSS/BLUE SHIELD | Admitting: Family Medicine

## 2017-02-26 VITALS — BP 132/85 | HR 99 | Temp 98.5°F | Ht 66.0 in | Wt 388.0 lb

## 2017-02-26 DIAGNOSIS — R7303 Prediabetes: Secondary | ICD-10-CM | POA: Insufficient documentation

## 2017-02-26 DIAGNOSIS — E559 Vitamin D deficiency, unspecified: Secondary | ICD-10-CM | POA: Diagnosis not present

## 2017-02-26 DIAGNOSIS — E669 Obesity, unspecified: Secondary | ICD-10-CM | POA: Diagnosis not present

## 2017-02-26 DIAGNOSIS — IMO0001 Reserved for inherently not codable concepts without codable children: Secondary | ICD-10-CM | POA: Insufficient documentation

## 2017-02-26 DIAGNOSIS — Z6841 Body Mass Index (BMI) 40.0 and over, adult: Secondary | ICD-10-CM | POA: Diagnosis not present

## 2017-02-26 MED ORDER — VITAMIN D (ERGOCALCIFEROL) 1.25 MG (50000 UNIT) PO CAPS: 50000.0000 [IU] | ORAL_CAPSULE | ORAL | 0 refills | Status: DC

## 2017-02-26 NOTE — Progress Notes (Signed)
Office: [phone]  /  Fax: [phone]   HPI:   Chief Complaint: OBESITY Bernardette is here to discuss her progress with her obesity treatment plan. She is on the Category 3 plan +300 calories and is following her eating plan approximately 0 % of the time. She states she is exercising 0 minutes 0 times per week. [name] wants weight loss surgery, had 1st appointment last week. She was H Pylori positive and has a upper GI scheduled and a psych evaluation scheduled. She didn't tolerate the category 3 plan and thought it was too much food and she didn't like her breakfast options. Her weight is (!) 388 lb (176 kg) today and had a weight gain of 1 pound over a period of 2 weeks since her last visit. She has gained 1 lb since starting treatment with [place].  Vitamin D deficiency [name] has a new diagnosis of vitamin D deficiency. She is not currently taking vit D and admits fatigue but denies nausea, vomiting or muscle weakness.  Pre-Diabetes [name] has a diagnosis of pre-diabetes based on her elevated Hgb A1c at 6.0 and fasting insulin very elevated at 60 and was informed this puts her at greater risk of developing diabetes. She is not taking metformin currently and continues to work on diet and exercise to decrease risk of diabetes. She denies nausea or hypoglycemia.    ALLERGIES: No Known Allergies  MEDICATIONS: Current Outpatient Prescriptions on File Prior to Visit  Medication Sig Dispense Refill   acetaZOLAMIDE (DIAMOX) 500 MG capsule TAKE 1 CAPSULE THREE TIMES A DAY 90 capsule 6   albuterol (PROVENTIL HFA;VENTOLIN HFA) 108 (90 Base) MCG/ACT inhaler Inhale 2 puffs into the lungs every 6 (six) hours as needed for wheezing or shortness of breath. 1 Inhaler 1   hydrochlorothiazide (HYDRODIURIL) 12.5 MG tablet Take 1 tablet (12.5 mg total) by mouth daily. 90 tablet 3   ibuprofen (ADVIL,MOTRIN) 200 MG tablet Take 400 mg by mouth every 6 (six) hours as needed for headache or moderate  pain.      Insulin Pen Needle (PEN NEEDLES) 32G X 4 MM MISC 1 each by Does not apply route daily. 100 each 1   lisinopril (PRINIVIL,ZESTRIL) 20 MG tablet Take 1 tablet (20 mg total) by mouth daily. 90 tablet 3   lisinopril (PRINIVIL,ZESTRIL) 40 MG tablet TAKE 1 TABLET (40 MG TOTAL) BY MOUTH DAILY. 90 tablet 1   omeprazole (PRILOSEC) 40 MG capsule TAKE 1 CAPSULE (40 MG TOTAL) BY MOUTH DAILY. 30 capsule 4   pramipexole (MIRAPEX) 0.75 MG tablet TAKE 1 TABLET (0.75 MG TOTAL) BY MOUTH 3 (THREE) TIMES DAILY. 90 tablet 0   VICTOZA 18 MG/3ML SOPN INJECT 0.2 MLS (1.2 MG TOTAL) INTO THE SKIN DAILY. 6 mL 1   No current facility-administered medications on file prior to visit.     PAST MEDICAL HISTORY: Past Medical History:  Diagnosis Date   Ankle fracture, left 2000   Anxiety    Arthritis    left wrist and left ankle   Back pain    Bronchitis    Diabetes (HCC) 01/11/2016   GERD (gastroesophageal reflux disease)    HLD (hyperlipidemia)    Hypertension 2013   Intracranial hypertension    psuedotumor cebrum   Joint pain    Kienbock's disease    Leg edema    OSA (obstructive sleep apnea)    Papilledema    RLS (restless legs syndrome)    Sinus tachycardia     PAST SURGICAL HISTORY: Past  Surgical History:  Procedure Laterality Date   FRACTURE SURGERY  2005   ankle   FRACTURE SURGERY  2013   wrist (deteriorating bone)   WRIST SURGERY  2015    SOCIAL HISTORY: Social History  Substance Use Topics   Smoking status: Never Smoker   Smokeless tobacco: Never Used   Alcohol use 0.0 oz/week     Comment: socially    FAMILY HISTORY: Family History  Problem Relation Age of Onset   Heart attack Mother    Hypertension Mother    Obesity Mother    Depression Mother    Anxiety disorder Mother    Bipolar disorder Mother    Cancer Father    Migraines Neg Hx     ROS: Review of Systems  Constitutional: Positive for malaise/fatigue. Negative for weight  loss.  Gastrointestinal: Negative for nausea and vomiting.  Musculoskeletal:       Negative muscle weakness  Endo/Heme/Allergies:       Negative hypoglycemia    PHYSICAL EXAM: Blood pressure 132/85, pulse 99, temperature 98.5 F (36.9 C), temperature source Oral, height  (1.676 m), weight (!) 388 lb (176 kg), last menstrual period 01/29/2017, SpO2 96 %. Body mass index is 62.62 kg/m. Physical Exam  Constitutional: She is oriented to person, place, and time. She appears well-developed and well-nourished.  Cardiovascular: Normal rate.   Pulmonary/Chest: Effort normal.  Musculoskeletal: Normal range of motion.  Neurological: She is oriented to person, place, and time.  Skin: Skin is warm and dry.  Psychiatric: She has a normal mood and affect. Her behavior is normal.  Vitals reviewed.   RECENT LABS AND TESTS: BMET    Component Value Date/Time   NA 140 02/14/2017 1129   K 4.3 02/14/2017 1129   CL 104 02/14/2017 1129   CO2 23 02/14/2017 1129   GLUCOSE 92 02/14/2017 1129   GLUCOSE 89 07/13/2015 2052   BUN 12 02/14/2017 1129   CREATININE 0.63 02/14/2017 1129   CALCIUM 9.5 02/14/2017 1129   GFRNONAA 125 02/14/2017 1129   GFRAA 144 02/14/2017 1129   Lab Results  Component Value Date   HGBA1C 6.0 (H) 02/14/2017   Lab Results  Component Value Date   INSULIN 60.5 (H) 02/14/2017   CBC    Component Value Date/Time   WBC 9.6 02/14/2017 1129   RBC 4.88 02/14/2017 1129   HGB 12.6 02/14/2017 1129   HCT 39.2 02/14/2017 1129   PLT 252 02/07/2016 0923   MCV 80 02/14/2017 1129   MCH 25.8 (L) 02/14/2017 1129   MCHC 32.1 02/14/2017 1129   RDW 15.2 02/14/2017 1129   LYMPHSABS 3.1 02/14/2017 1129   EOSABS 0.5 (H) 02/14/2017 1129   BASOSABS 0.0 02/14/2017 1129   Iron/TIBC/Ferritin/ %Sat    Component Value Date/Time   FERRITIN 45 02/07/2016 0923   Lipid Panel     Component Value Date/Time   CHOL 185 02/14/2017 1129   TRIG 172 (H) 02/14/2017 1129   HDL 32 (L)  02/14/2017 1129   CHOLHDL 5.3 (H) 11/14/2016 1127   LDLCALC 119 (H) 02/14/2017 1129   Hepatic Function Panel     Component Value Date/Time   PROT 6.9 02/14/2017 1129   ALBUMIN 4.2 02/14/2017 1129   AST 17 02/14/2017 1129   ALT 28 02/14/2017 1129   ALKPHOS 53 02/14/2017 1129   BILITOT 0.6 02/14/2017 1129      Component Value Date/Time   TSH 1.650 02/14/2017 1129   TSH 3.280 11/14/2016 1127   TSH 2.510  01/07/2016 1140    ASSESSMENT AND PLAN: Vitamin D deficiency - Plan: Vitamin D, Ergocalciferol, (DRISDOL) 50000 units CAPS capsule  Prediabetes  Class 3 obesity with serious comorbidity and body mass index (BMI) of 60.0 to 69.9 in adult, unspecified obesity type (HCC)  PLAN:  Vitamin D Deficiency Keasha was informed that low vitamin D levels contributes to fatigue and are associated with obesity, breast, and colon cancer. She agrees to start to take prescription Vit D ,000 IU every week #4 with no refills and will follow up for routine testing of vitamin D, at least 2-3 times per year. She was informed of the risk of over-replacement of vitamin D and agrees to not increase her dose unless he discusses this with [place] first. Maxwell agrees to follow up with our clinic in 3 weeks.  Pre-Diabetes Robert will continue to work on weight loss, exercise, and decreasing simple carbohydrates in her diet to help decrease the risk of diabetes. We dicussed metformin including benefits and risks. She was informed that eating too many simple carbohydrates or too many calories at one sitting increases the likelihood of GI side effects. Ren declined metformin for now and a prescription was not written today. Jazzlene agreed to follow up with [place] as directed to monitor her progress.  Obesity Yoona is currently in the action stage of change. As such, her goal is to continue with weight loss efforts She has agreed to change to follow the Category 2 plan Kyana has been instructed to  work up to a goal of 150 minutes of combined cardio and strengthening exercise per week for weight loss and overall health benefits. We discussed the following Behavioral Modification Strategies today: increasing lean protein intake, decreasing simple carbohydrates  and decrease eating out  Bristyl has agreed to follow up with our clinic in 3 weeks and with our dietician in 1 to 2 weeks. She was informed of the importance of frequent follow up visits to maximize her success with intensive lifestyle modifications for her multiple health conditions.  I, [place] Crane, am acting as transcriptionist for Quillian Quince, MD  I have reviewed the above documentation for accuracy and completeness, and I agree with the above. -Quillian Quince, MD    OBESITY BEHAVIORAL INTERVENTION VISIT  Today's visit was # 2 out of 22.  Starting weight: 387 lbs Starting date: 02/14/17 Today's weight : 388 lbs Today's date: 02/26/2017 Total lbs lost to date: 0 (Patients must lose 7 lbs in the first 6 months to continue with counseling)   ASK: We discussed the diagnosis of obesity with Layci M Hickman today and Caydance agreed to give [place] permission to discuss obesity behavioral modification therapy today.  ASSESS: Ota has the diagnosis of obesity and her BMI today is 62.65 Stellah is in the action stage of change   ADVISE: Maguire was educated on the multiple health risks of obesity as well as the benefit of weight loss to improve her health. She was advised of the need for long term treatment and the importance of lifestyle modifications.  AGREE: Multiple dietary modification options and treatment options were discussed and  Oyinkansola agreed to change to follow the Category 2 plan We discussed the following Behavioral Modification Strategies today: increasing lean protein intake, decreasing simple carbohydrates  and decrease eating out

## 2017-03-01 ENCOUNTER — Other Ambulatory Visit: Payer: Self-pay

## 2017-03-01 MED ORDER — LIRAGLUTIDE 18 MG/3ML ~~LOC~~ SOPN: 1.2000 mg | PEN_INJECTOR | Freq: Every day | SUBCUTANEOUS | 0 refills | Status: DC

## 2017-03-07 ENCOUNTER — Ambulatory Visit (INDEPENDENT_AMBULATORY_CARE_PROVIDER_SITE_OTHER): Payer: BLUE CROSS/BLUE SHIELD | Admitting: Dietician

## 2017-03-07 ENCOUNTER — Ambulatory Visit (HOSPITAL_COMMUNITY)
Admission: RE | Admit: 2017-03-07 | Discharge: 2017-03-07 | Disposition: A | Discharge status: Home / Self Care | Payer: BLUE CROSS/BLUE SHIELD | Source: Ambulatory Visit | Attending: Surgery | Admitting: Surgery

## 2017-03-07 VITALS — Ht 66.0 in | Wt 384.0 lb

## 2017-03-07 DIAGNOSIS — Z9189 Other specified personal risk factors, not elsewhere classified: Secondary | ICD-10-CM | POA: Diagnosis not present

## 2017-03-07 DIAGNOSIS — Z6841 Body Mass Index (BMI) 40.0 and over, adult: Secondary | ICD-10-CM | POA: Diagnosis not present

## 2017-03-07 DIAGNOSIS — E119 Type 2 diabetes mellitus without complications: Secondary | ICD-10-CM | POA: Diagnosis not present

## 2017-03-09 ENCOUNTER — Encounter: Payer: Self-pay | Admitting: Registered"

## 2017-03-09 ENCOUNTER — Encounter: Payer: BLUE CROSS/BLUE SHIELD | Attending: Surgery | Admitting: Registered"

## 2017-03-09 DIAGNOSIS — Z713 Dietary counseling and surveillance: Secondary | ICD-10-CM | POA: Diagnosis present

## 2017-03-09 DIAGNOSIS — Z6841 Body Mass Index (BMI) 40.0 and over, adult: Secondary | ICD-10-CM | POA: Diagnosis not present

## 2017-03-09 DIAGNOSIS — E119 Type 2 diabetes mellitus without complications: Secondary | ICD-10-CM

## 2017-03-09 NOTE — Progress Notes (Signed)
Pre-Op Assessment Visit:  Pre-Operative Sleeve gastrectomy Surgery  Medical Nutrition Therapy:  Appt start time: 9:50  End time:  10:45  Patient was seen on 03/09/2017 for Pre-Operative Nutrition Assessment. Assessment and letter of approval faxed to [facility] Bariatric Surgery Program coordinator on 03/09/2017.   Pt expectation of surgery: feel better, be more active, have more confidence  Pt expectation of Dietitian: always be positive, motivational   Start weight at [facility]: 387.6 BMI: 63.04   Pt states arrives with grandmother. Pt states she has had diabetes approximately one year. Pt states she checks her BS every now and again. Pt's last A1c was 6.0. Pt states she may experience hypoglycemia sometimes and will get something sweet. Pt states she has not had diabetes education. Pt states she is aiming to get 1200 calories/day, per [facility].   Pt was educated on hypoglycemia and hyperglycemia, importance of checking BS regularly, and eating meals/snacks to keep carbohydrates balanced throughout her day.   Per insurance, pt needs 0 SWL visits with [place] prior to surgery. Pt states she has met all visit requirements and only needs assessment and pre-op class with [place] prior to surgery. Pt states she is looking to have surgery in Nov.    24 hr Dietary Recall: First Meal: [place] bacon Snack: none Second Meal: spaghetti, cantoloupe Snack: none Third Meal: burger patty Snack: none Beverages: water (4 bottles), 10 oz of soda (sometimes)  Encouraged to engage in 75 minutes of moderate physical activity including cardiovascular and weight baring weekly  Handouts given during visit include:  . Pre-Op Goals . Bariatric Surgery Protein Shakes . Vitamin and Mineral Recommendations  During the appointment today the following Pre-Op Goals were reviewed with the patient: . Maintain or lose weight as instructed by your surgeon . Make healthy food choices . Begin to  limit portion sizes . Limited concentrated sugars and fried foods . Keep fat/sugar in the single digits per serving on          food labels . Practice CHEWING your food  (aim for 30 chews per bite or until applesauce consistency) . Practice not drinking 15 minutes before, during, and 30 minutes after each meal/snack . Avoid all carbonated beverages  . Avoid/limit caffeinated beverages  . Avoid all sugar-sweetened beverages . Consume 3 meals per day; eat every 3-5 hours . Make a list of non-food related activities . Aim for 64-100 ounces of FLUID daily  . Aim for at least 60-80 grams of PROTEIN daily . Look for a liquid protein source that contain ?15 g protein and ?5 g carbohydrate  (ex: shakes, drinks, shots) . Physical activity is an important part of a healthy lifestyle so keep it moving!  Follow diet recommendations listed below Energy and Macronutrient Recommendations: Calories: 1800 Carbohydrate: 200 Protein: 135 Fat: 50  Demonstrated degree of understanding via:  Teach Back   Teaching Method Utilized:  Visual Auditory Hands on  Barriers to learning/adherence to lifestyle change: none  Patient to call the Nutrition and Diabetes Education Services to enroll in Pre-Op and Post-Op Nutrition Education when surgery date is scheduled.

## 2017-03-13 NOTE — Progress Notes (Signed)
   Office: [phone]  /  Fax: [phone]     Alphia has a diagnosis of diabetes type II and is at a higher risk for heart disease. Arlone states patient does not check sugars and denies any hypoglycemic episodes. Last A1c was Hemoglobin A1C Latest Ref Rng & Units 02/14/2017  HGBA1C 4.8 - 5.6 % 6.0(H)  Some recent data might be hidden    She has been working on intensive lifestyle modifications including diet, exercise, and weight loss to help control her blood glucose levels and reduce her heart disease risk.   Patient was educated about food nutrients ie protein, fats, simple and complex carbohydrates and how these affect insulin response. Focus on portion control,  avoiding simple carbohydrates and lower fat foods for ongoing wt loss efforts and glucose management    Rozena is on the following meal plan category 2. She followed the meal plan approximately 75% of the time and had a weight loss of 4 lbs since her last visit with [place].  She is planning on having the gastric sleeve bariatric surgery in November . Discussed the importance of following the recommendations of her surgeon pre-op. Patient was educated on the importance of some weight loss prior to surgery to minimize complications. Her BMI today  62.01. She reports she is more motivated to lose weight prior to surgery. She states she did better with the category 2 meal plan and is eating out less often.  Her  meal plan was individualized for maximum benefit.  Also discussed at length the following behavioral modifications to help maximize success increasing lean protein intake, decreasing simple carbohydrates, increasing vegetables, increase water intake,  decreasing eating out,  keeping healthy foods in the home, family/coworker sabotage,  and decreasing junk food.    Danielly has been instructed to work up to a goal of 150 minutes of combined cardio and strengthening exercise per week for weight loss and overall health  benefits.    Office: [phone]  /  Fax: [phone]  OBESITY BEHAVIORAL INTERVENTION VISIT  Today's visit was # 3 out of 22.  Starting weight: 387 lbs.  Starting date: 02/14/17 Today's weight : Weight: (!) 384 lb (174.2 kg)  Today's date: 03/07/17 Total lbs lost to date: 4 lbs. (Patients must lose 7 lbs in the first 6 months to continue with counseling)   ASK: We discussed the diagnosis of obesity with Briteny M Hickman today and Allianna agreed to give [place] permission to discuss obesity behavioral modification therapy today.  ASSESS: Ahmani has the diagnosis of obesity and her BMI today is 43.01 Nannette is in the action stage of change   ADVISE: Georgeana was educated on the multiple health risks of obesity and diabetes  as well as the benefit of weight loss to improve her health. She was advised of the need for long term treatment and the importance of lifestyle modifications.  AGREE: Multiple dietary modification options and treatment options were discussed and  Jazzman agreed to follow the Category 2 plan We discussed the following Behavioral Modification Stratagies today: increasing lean protein intake, decreasing simple carbohydrates  and decrease eating out and the importance of some weight loss prior to weight loss surgery.

## 2017-03-19 ENCOUNTER — Encounter: Payer: BLUE CROSS/BLUE SHIELD | Attending: Surgery | Admitting: Skilled Nursing Facility1

## 2017-03-19 ENCOUNTER — Ambulatory Visit (INDEPENDENT_AMBULATORY_CARE_PROVIDER_SITE_OTHER): Payer: BLUE CROSS/BLUE SHIELD | Admitting: Family Medicine

## 2017-03-19 VITALS — BP 138/83 | HR 116 | Temp 97.9°F | Ht 65.0 in | Wt 383.0 lb

## 2017-03-19 DIAGNOSIS — E118 Type 2 diabetes mellitus with unspecified complications: Secondary | ICD-10-CM | POA: Diagnosis not present

## 2017-03-19 DIAGNOSIS — Z713 Dietary counseling and surveillance: Secondary | ICD-10-CM | POA: Diagnosis present

## 2017-03-19 DIAGNOSIS — Z6841 Body Mass Index (BMI) 40.0 and over, adult: Secondary | ICD-10-CM

## 2017-03-19 DIAGNOSIS — F3289 Other specified depressive episodes: Secondary | ICD-10-CM

## 2017-03-19 DIAGNOSIS — E119 Type 2 diabetes mellitus without complications: Secondary | ICD-10-CM

## 2017-03-19 MED ORDER — BUPROPION HCL ER (SR) 150 MG PO TB12: 150.0000 mg | ORAL_TABLET | Freq: Every day | ORAL | 0 refills | Status: DC

## 2017-03-20 ENCOUNTER — Encounter: Payer: Self-pay | Admitting: Skilled Nursing Facility1

## 2017-03-20 NOTE — Progress Notes (Signed)
Office: [phone]  /  Fax: [phone]   HPI:   Chief Complaint: OBESITY Cassandra Tucker is here to discuss her progress with her obesity treatment plan. Cassandra is on the Category 2 plan and is following her eating plan approximately 90 % of the time. Cassandra states Cassandra is exercising 0 minutes 0 times per week. Katelin is getting ready to have weight loss surgery and still needs psych evaluation and pre-op class today. Cassandra is working on planning meals ahead of time and is trying to decrease liquid calories. Her weight is (!) 383 lb (173.7 kg) today and has had a weight loss of 1 pound over a period of 3 weeks since her last visit. Cassandra has lost 3 lbs since starting treatment with [place].  Diabetes II Cassandra Tucker has a diagnosis of diabetes type II and is on Victoza 1.8 Cassandra Tucker states fasting BGs range between 90 and 120 on average and denies any hypoglycemic episodes. Last A1c was at 6.0 Cassandra has been working on intensive lifestyle modifications including diet, exercise, and weight loss to help control her blood glucose levels.  Depression with emotional eating behaviors Cassandra Tucker notes increased emotional eating in the last few weeks. Cassandra has had a difficult time differentiating between hunger and cravings. Cassandra Tucker struggles with emotional eating and using food for comfort to the extent that it is negatively impacting her health. Cassandra often snacks when Cassandra is not hungry. Cassandra Tucker sometimes feels Cassandra is out of control and then feels guilty that Cassandra made poor food choices. Cassandra has been working on behavior modification techniques to help reduce her emotional eating and has been somewhat successful. Cassandra shows no sign of suicidal or homicidal ideations.  Depression screen Cassandra Tucker  Decreased Interest 0 2 0 0 0  Down, Depressed, Hopeless 0 2 0 0 0  PHQ - 2 Score 0 4 0 0 0  Altered sleeping - 1 - - -  Tired, decreased energy - 3 - - -  Change in appetite - 2 - - -    Feeling bad or failure about yourself  - 3 - - -  Trouble concentrating - 2 - - -  Moving slowly or fidgety/restless - 2 - - -  Suicidal thoughts - 1 - - -  PHQ-9 Score - 18 - - -  Difficult doing work/chores - Very difficult - - -      ALLERGIES: No Known Allergies  MEDICATIONS: Current Outpatient Prescriptions on File Prior to Visit  Medication Sig Dispense Refill  . acetaZOLAMIDE (DIAMOX) 500 MG capsule TAKE 1 CAPSULE THREE TIMES A DAY 90 capsule 6  . albuterol (PROVENTIL HFA;VENTOLIN HFA) 108 (90 Base) MCG/ACT inhaler Inhale 2 puffs into the lungs every 6 (six) hours as needed for wheezing or shortness of breath. 1 Inhaler 1  . hydrochlorothiazide (HYDRODIURIL) 12.5 MG tablet Take 1 tablet (12.5 mg total) by mouth daily. 90 tablet 3  . ibuprofen (ADVIL,MOTRIN) 200 MG tablet Take 400 mg by mouth every 6 (six) hours as needed for headache or moderate pain.     . Insulin Pen Needle (PEN NEEDLES) 32G X 4 MM MISC 1 each by Does not apply route daily. 100 each 1  . liraglutide (VICTOZA) 18 MG/3ML SOPN Inject 0.2 mLs (1.2 mg total) into the skin daily. 18 mL 0  . lisinopril (PRINIVIL,ZESTRIL) 20 MG tablet Take 1 tablet (20 mg total) by mouth daily. 90 tablet 3  . lisinopril (PRINIVIL,ZESTRIL) 40 MG tablet TAKE 1  TABLET (40 MG TOTAL) BY MOUTH DAILY. 90 tablet 1  . omeprazole (PRILOSEC) 40 MG capsule TAKE 1 CAPSULE (40 MG TOTAL) BY MOUTH DAILY. 30 capsule 4  . pramipexole (MIRAPEX) 0.75 MG tablet TAKE 1 TABLET (0.75 MG TOTAL) BY MOUTH 3 (THREE) TIMES DAILY. 90 tablet 0  . Vitamin D, Ergocalciferol, (DRISDOL) 50000 units CAPS capsule Take 1 capsule (50,000 Units total) by mouth every 7 (seven) days. 4 capsule 0   No current facility-administered medications on file prior to visit.     PAST MEDICAL HISTORY: Past Medical History:  Diagnosis Date  . Ankle fracture, left 2000  . Anxiety   . Arthritis    left wrist and left ankle  . Back pain   . Bronchitis   . Diabetes (HCC) 01/11/2016   . GERD (gastroesophageal reflux disease)   . HLD (hyperlipidemia)   . Hypertension 2013  . Intracranial hypertension    psuedotumor cebrum  . Joint pain   . Kienbock's disease   . Leg edema   . OSA (obstructive sleep apnea)   . Papilledema   . RLS (restless legs syndrome)   . Sinus tachycardia     PAST SURGICAL HISTORY: Past Surgical History:  Procedure Laterality Date  . FRACTURE SURGERY  2005   ankle  . FRACTURE SURGERY  2013   wrist (deteriorating bone)  . WRIST SURGERY  2015    SOCIAL HISTORY: Social History  Substance Use Topics  . Smoking status: Never Smoker  . Smokeless tobacco: Never Used  . Alcohol use 0.0 oz/week     Comment: socially    FAMILY HISTORY: Family History  Problem Relation Age of Onset  . Heart attack Mother   . Hypertension Mother   . Obesity Mother   . Depression Mother   . Anxiety disorder Mother   . Bipolar disorder Mother   . Cancer Father   . Migraines Neg Hx     ROS: Review of Systems  Constitutional: Positive for weight loss.  Endo/Heme/Allergies:       Negative hypoglycemia  Psychiatric/Behavioral: Positive for depression. Negative for suicidal ideas.    PHYSICAL EXAM: Blood pressure 138/83, pulse (!) 116, temperature 97.9 F (36.6 C), temperature source Oral, height  (1.651 m), weight (!) 383 lb (173.7 kg), last menstrual period 02/27/2017, SpO2 98 %. Body mass index is 63.73 kg/m. Physical Exam  Constitutional: Cassandra is oriented to person, place, and time. Cassandra appears well-developed and well-nourished.  Cardiovascular: Normal rate.   Pulmonary/Chest: Effort normal.  Musculoskeletal: Normal range of motion.  Neurological: Cassandra is oriented to person, place, and time.  Skin: Skin is warm and dry.  Vitals reviewed.   RECENT LABS AND TESTS: BMET    Component Value Date/Time   NA 140 02/14/2017 1129   K 4.3 02/14/2017 1129   CL 104 02/14/2017 1129   CO2 23 02/14/2017 1129   GLUCOSE 92 02/14/2017 1129    GLUCOSE 89 07/13/2015 2052   BUN 12 02/14/2017 1129   CREATININE 0.63 02/14/2017 1129   CALCIUM 9.5 02/14/2017 1129   GFRNONAA 125 02/14/2017 1129   GFRAA 144 02/14/2017 1129   Lab Results  Component Value Date   HGBA1C 6.0 (H) 02/14/2017   Lab Results  Component Value Date   INSULIN 60.5 (H) 02/14/2017   CBC    Component Value Date/Time   WBC 9.6 02/14/2017 1129   RBC 4.88 02/14/2017 1129   HGB 12.6 02/14/2017 1129   HCT 39.2 02/14/2017 1129  PLT 252 02/07/2016 0923   MCV 80 02/14/2017 1129   MCH 25.8 (L) 02/14/2017 1129   MCHC 32.1 02/14/2017 1129   RDW 15.2 02/14/2017 1129   LYMPHSABS 3.1 02/14/2017 1129   EOSABS 0.5 (H) 02/14/2017 1129   BASOSABS 0.0 02/14/2017 1129   Iron/TIBC/Ferritin/ %Sat    Component Value Date/Time   FERRITIN 45 02/07/2016 0923   Lipid Panel     Component Value Date/Time   CHOL 185 02/14/2017 1129   TRIG 172 (H) 02/14/2017 1129   HDL 32 (L) 02/14/2017 1129   CHOLHDL 5.3 (H) 11/14/2016 1127   LDLCALC 119 (H) 02/14/2017 1129   Hepatic Function Panel     Component Value Date/Time   PROT 6.9 02/14/2017 1129   ALBUMIN 4.2 02/14/2017 1129   AST 17 02/14/2017 1129   ALT 28 02/14/2017 1129   ALKPHOS 53 02/14/2017 1129   BILITOT 0.6 02/14/2017 1129      Component Value Date/Time   TSH 1.650 02/14/2017 1129   TSH 3.280 11/14/2016 1127   TSH 2.510 01/07/2016 1140    ASSESSMENT AND PLAN: Controlled type 2 diabetes mellitus with complication, without long-term current use of insulin (HCC)  Other depression - increased emotional eating  Class 3 severe obesity with serious comorbidity and body mass index (BMI) of 60.0 to 69.9 in adult, unspecified obesity type (HCC)  PLAN:  Diabetes II Cassandra Tucker has been given extensive diabetes education by myself today including ideal fasting and post-prandial blood glucose readings, individual ideal Hgb A1c goals  and hypoglycemia prevention. We discussed the importance of good blood sugar control  to decrease the likelihood of diabetic complications such as nephropathy, neuropathy, limb loss, blindness, coronary artery disease, and death. We discussed the importance of intensive lifestyle modification including diet, exercise and weight loss as the first line treatment for diabetes. Cassandra agrees to continue with diet and exercise. Musette agrees to continue Victoza as prescribed and will follow up at the agreed upon time.  Depression with Emotional Eating Behaviors We discussed behavior modification techniques today to help Cassandra Tucker deal with her emotional eating and depression. Cassandra has agreed to start Wellbutrin SR 150 mg qam #30 with no refills and will follow up as directed.  Obesity Cassandra Tucker is currently in the action stage of change. As such, her goal is to continue with weight loss efforts Cassandra has agreed to follow the Category 2 plan Cassandra Tucker has been instructed to work up to a goal of 150 minutes of combined cardio and strengthening exercise per week for weight loss and overall health benefits. We discussed the following Behavioral Modification Strategies today: increasing lean protein intake and decreasing simple carbohydrates   Belvie has agreed to follow up with our clinic in 2 weeks. Cassandra Tucker to maximize her success with intensive lifestyle modifications for her multiple health conditions.  I, [place] Crane, am acting as transcriptionist for Quillian Quince, MD  I have reviewed the above documentation for accuracy and completeness, and I agree with the above. -Quillian Quince, MD   OBESITY BEHAVIORAL INTERVENTION VISIT  Today's visit was # 3 out of 22.  Starting weight: 387 lbs Starting date: 02/14/17 Today's weight : 384 lbs Today's date: 03/19/2017 Total lbs lost to date: 3 (Patients must lose 7 lbs in the first 6 months to continue with counseling)   ASK: We discussed the diagnosis of obesity with Whisper M Hickman  today and Shelita agreed to give [place] permission to  discuss obesity behavioral modification therapy today.  ASSESS: Caylyn has the diagnosis of obesity and her BMI today is 62.52 Charnese is in the action stage of change   ADVISE: Lativia was educated on the multiple health risks of obesity as well as the benefit of weight loss to improve her health. Cassandra was advised of the need for long term treatment and the importance of lifestyle modifications.  AGREE: Multiple dietary modification options and treatment options were discussed and  Jaylani agreed to follow the Category 2 plan We discussed the following Behavioral Modification Strategies today: increasing lean protein intake and decreasing simple carbohydrates

## 2017-03-20 NOTE — Progress Notes (Signed)
Pre-Operative Nutrition Class:  Appt start time: 1593   End time:  1830.  Patient was seen on 03/19/2017 for Pre-Operative Bariatric Surgery Education at [facility].   Surgery date:  Surgery type: sleeve Start weight at [facility]: 387.6 Weight today: 384.4  Samples given per MNT protocol. Patient educated on appropriate usage: [employer] Multivitamin Lot # [id number] Exp: 12/2018  Bariatric Advantage Calcium  Lot # [id number] Exp: nov-07-2016  Renee Pain Protein Shake Lot # [id number] Exp: Nov 05, 2017 The following the learning objectives were met by the patient during this course:  Identify Pre-Op Dietary Goals and will begin 2 weeks pre-operatively  Identify appropriate sources of fluids and proteins   State protein recommendations and appropriate sources pre and post-operatively  Identify Post-Operative Dietary Goals and will follow for 2 weeks post-operatively  Identify appropriate multivitamin and calcium sources  Describe the need for physical activity post-operatively and will follow MD recommendations  State when to call healthcare provider regarding medication questions or post-operative complications  Handouts given during class include:  Pre-Op Bariatric Surgery Diet Handout  Protein Shake Handout  Post-Op Bariatric Surgery Nutrition Handout  BEL[facility] Program Information Flyer  Support Group Information Flyer  [facility] Bariatric Supplements Price List  [facility]: [facility] per MD.

## 2017-04-02 ENCOUNTER — Ambulatory Visit (INDEPENDENT_AMBULATORY_CARE_PROVIDER_SITE_OTHER): Payer: BLUE CROSS/BLUE SHIELD | Admitting: Family Medicine

## 2017-04-02 ENCOUNTER — Encounter: Payer: Self-pay | Admitting: Family Medicine

## 2017-04-02 ENCOUNTER — Ambulatory Visit (INDEPENDENT_AMBULATORY_CARE_PROVIDER_SITE_OTHER): Payer: BLUE CROSS/BLUE SHIELD | Admitting: Physician Assistant

## 2017-04-02 VITALS — BP 137/91 | HR 98 | Temp 97.5°F | Ht 65.0 in | Wt 379.0 lb

## 2017-04-02 VITALS — BP 134/80 | HR 100 | Temp 97.6°F | Ht 65.75 in | Wt 385.5 lb

## 2017-04-02 DIAGNOSIS — E669 Obesity, unspecified: Secondary | ICD-10-CM | POA: Diagnosis not present

## 2017-04-02 DIAGNOSIS — I10 Essential (primary) hypertension: Secondary | ICD-10-CM

## 2017-04-02 DIAGNOSIS — E119 Type 2 diabetes mellitus without complications: Secondary | ICD-10-CM | POA: Diagnosis not present

## 2017-04-02 DIAGNOSIS — Z23 Encounter for immunization: Secondary | ICD-10-CM

## 2017-04-02 DIAGNOSIS — Z6841 Body Mass Index (BMI) 40.0 and over, adult: Secondary | ICD-10-CM

## 2017-04-02 NOTE — Patient Instructions (Signed)
Great to see you!   

## 2017-04-02 NOTE — Progress Notes (Signed)
Office: [phone]  /  Fax: [phone]   HPI:   Chief Complaint: OBESITY Cassandra Tucker is here to discuss her progress with her obesity treatment plan. She is on the Category 2 plan and is following her eating plan approximately 85 to 90 % of the time. She states she is exercising 0 minutes 0 times per week. Cassandra Tucker continues to do well with weight loss. She plans her meals well and states hunger is well controlled. Her weight is (!) 379 lb (171.9 kg) today and has had a weight loss of 4 pounds over a period of 2 weeks since her last visit. She has lost 8 lbs since starting treatment with [place].  Diabetes II Cassandra Tucker has a diagnosis of diabetes type II. Cassandra Tucker states fasting BGs range between 90 and 120 and states she felt low on a couple of occasions but has not taken her blood sugars. She has been working on intensive lifestyle modifications including diet, exercise, and weight loss to help control her blood glucose levels.   ALLERGIES: No Known Allergies  MEDICATIONS: Current Outpatient Prescriptions on File Prior to Visit  Medication Sig Dispense Refill  . acetaZOLAMIDE (DIAMOX) 500 MG capsule TAKE 1 CAPSULE THREE TIMES A DAY 90 capsule 6  . albuterol (PROVENTIL HFA;VENTOLIN HFA) 108 (90 Base) MCG/ACT inhaler Inhale 2 puffs into the lungs every 6 (six) hours as needed for wheezing or shortness of breath. 1 Inhaler 1  . buPROPion (WELLBUTRIN SR) 150 MG 12 hr tablet Take 1 tablet (150 mg total) by mouth daily with breakfast. 30 tablet 0  . hydrochlorothiazide (HYDRODIURIL) 12.5 MG tablet Take 1 tablet (12.5 mg total) by mouth daily. 90 tablet 3  . ibuprofen (ADVIL,MOTRIN) 200 MG tablet Take 400 mg by mouth every 6 (six) hours as needed for headache or moderate pain.     . Insulin Pen Needle (PEN NEEDLES) 32G X 4 MM MISC 1 each by Does not apply route daily. 100 each 1  . liraglutide (VICTOZA) 18 MG/3ML SOPN Inject 0.2 mLs (1.2 mg total) into the skin daily. 18 mL 0  . lisinopril  (PRINIVIL,ZESTRIL) 20 MG tablet Take 1 tablet (20 mg total) by mouth daily. 90 tablet 3  . omeprazole (PRILOSEC) 40 MG capsule TAKE 1 CAPSULE (40 MG TOTAL) BY MOUTH DAILY. 30 capsule 4  . Vitamin D, Ergocalciferol, (DRISDOL) 50000 units CAPS capsule Take 1 capsule (50,000 Units total) by mouth every 7 (seven) days. 4 capsule 0   No current facility-administered medications on file prior to visit.     PAST MEDICAL HISTORY: Past Medical History:  Diagnosis Date  . Ankle fracture, left 2000  . Anxiety   . Arthritis    left wrist and left ankle  . Back pain   . Bronchitis   . Diabetes (HCC) 01/11/2016  . GERD (gastroesophageal reflux disease)   . HLD (hyperlipidemia)   . Hypertension 2013  . Intracranial hypertension    psuedotumor cebrum  . Joint pain   . Kienbock's disease   . Leg edema   . OSA (obstructive sleep apnea)   . Papilledema   . RLS (restless legs syndrome)   . Sinus tachycardia     PAST SURGICAL HISTORY: Past Surgical History:  Procedure Laterality Date  . FRACTURE SURGERY  2005   ankle  . FRACTURE SURGERY  2013   wrist (deteriorating bone)  . WRIST SURGERY  2015    SOCIAL HISTORY: Social History  Substance Use Topics  . Smoking status: Never Smoker  .  Smokeless tobacco: Never Used  . Alcohol use 0.0 oz/week     Comment: socially    FAMILY HISTORY: Family History  Problem Relation Age of Onset  . Heart attack Mother   . Hypertension Mother   . Obesity Mother   . Depression Mother   . Anxiety disorder Mother   . Bipolar disorder Mother   . Cancer Father   . Migraines Neg Hx     ROS: Review of Systems  Constitutional: Positive for weight loss.  Endo/Heme/Allergies:       Positive hypoglycemia    PHYSICAL EXAM: Blood pressure (!) 137/91, pulse 98, temperature (!) 97.5 F (36.4 C), temperature source Oral, height 5\' 5"  (1.651 m), weight (!) 379 lb (171.9 kg), last menstrual period 03/05/2017, SpO2 97 %. Body mass index is 63.07  kg/m. Physical Exam  Constitutional: She is oriented to person, place, and time. She appears well-developed and well-nourished.  Cardiovascular: Normal rate.   Pulmonary/Chest: Effort normal.  Musculoskeletal: Normal range of motion.  Neurological: She is oriented to person, place, and time.  Skin: Skin is warm and dry.  Psychiatric: She has a normal mood and affect. Her behavior is normal.  Vitals reviewed.   RECENT LABS AND TESTS: BMET    Component Value Date/Time   NA 140 02/14/2017 1129   K 4.3 02/14/2017 1129   CL 104 02/14/2017 1129   CO2 23 02/14/2017 1129   GLUCOSE 92 02/14/2017 1129   GLUCOSE 89 07/13/2015 2052   BUN 12 02/14/2017 1129   CREATININE 0.63 02/14/2017 1129   CALCIUM 9.5 02/14/2017 1129   GFRNONAA 125 02/14/2017 1129   GFRAA 144 02/14/2017 1129   Lab Results  Component Value Date   HGBA1C 6.0 (H) 02/14/2017   Lab Results  Component Value Date   INSULIN 60.5 (H) 02/14/2017   CBC    Component Value Date/Time   WBC 9.6 02/14/2017 1129   RBC 4.88 02/14/2017 1129   HGB 12.6 02/14/2017 1129   HCT 39.2 02/14/2017 1129   PLT 252 02/07/2016 0923   MCV 80 02/14/2017 1129   MCH 25.8 (L) 02/14/2017 1129   MCHC 32.1 02/14/2017 1129   RDW 15.2 02/14/2017 1129   LYMPHSABS 3.1 02/14/2017 1129   EOSABS 0.5 (H) 02/14/2017 1129   BASOSABS 0.0 02/14/2017 1129   Iron/TIBC/Ferritin/ %Sat    Component Value Date/Time   FERRITIN 45 02/07/2016 0923   Lipid Panel     Component Value Date/Time   CHOL 185 02/14/2017 1129   TRIG 172 (H) 02/14/2017 1129   HDL 32 (L) 02/14/2017 1129   CHOLHDL 5.3 (H) 11/14/2016 1127   LDLCALC 119 (H) 02/14/2017 1129   Hepatic Function Panel     Component Value Date/Time   PROT 6.9 02/14/2017 1129   ALBUMIN 4.2 02/14/2017 1129   AST 17 02/14/2017 1129   ALT 28 02/14/2017 1129   ALKPHOS 53 02/14/2017 1129   BILITOT 0.6 02/14/2017 1129      Component Value Date/Time   TSH 1.650 02/14/2017 1129   TSH 3.280 11/14/2016  1127   TSH 2.510 01/07/2016 1140    ASSESSMENT AND PLAN: Type 2 diabetes mellitus without complication, without long-term current use of insulin (HCC)  Class 3 severe obesity with serious comorbidity and body mass index (BMI) of 60.0 to 69.9 in adult, unspecified obesity type (HCC)  PLAN:  Diabetes II Cassandra Tucker has been given extensive diabetes education by myself today including ideal fasting and post-prandial blood glucose readings, individual ideal Hgb  A1c goals and hypoglycemia prevention. We discussed the importance of good blood sugar control to decrease the likelihood of diabetic complications such as nephropathy, neuropathy, limb loss, blindness, coronary artery disease, and death. We discussed the importance of intensive lifestyle modification including diet, exercise and weight loss as the first line treatment for diabetes. Cassandra Tucker agrees to continue Victoza at 1.5 mg and will follow up at the agreed upon time.  We spent > than 50% of the 15 minute visit on the counseling as documented in the note.   Obesity Cassandra Tucker is currently in the action stage of change. As such, her goal is to continue with weight loss efforts She has agreed to follow the Category 2 plan Cassandra Tucker has been instructed to work up to a goal of 150 minutes of combined cardio and strengthening exercise per week for weight loss and overall health benefits. We discussed the following Behavioral Modification Strategies today: increasing lean protein intake and work on meal planning and easy cooking plans  Cassandra Tucker has agreed to follow up with our clinic in 2 weeks. She was informed of the importance of frequent follow up visits to maximize her success with intensive lifestyle modifications for her multiple health conditions.  I, [place] CraneJoanne Murray, am acting as transcriptionist for Illa LevelSahar Eknoor Novack, PA-C  I have reviewed the above documentation for accuracy and completeness, and I agree with the above. -Illa LevelSahar Cassandra Heckmann,  PA-C  I have reviewed the above note and agree with the plan. -Quillian Quincearen Beasley, MD   OBESITY BEHAVIORAL INTERVENTION VISIT  Today's visit was # 4 out of 22.  Starting weight: 387 lbs Starting date: 02/14/17 Today's weight : 379 lbs Today's date: 04/02/2017 Total lbs lost to date: 8 (Patients must lose 7 lbs in the first 6 months to continue with counseling)   ASK: We discussed the diagnosis of obesity with Cassandra Tucker today and Cassandra Tucker agreed to give us permission to discuss obesity behavioral modification therapy today.  ASSESS: Julianny has the diagnosis of obesity and her BMI today is 263.07 Rhyder is in the action stage of change   ADVISE: Hanaa was educated on the multiple health risks of obesity as well as the benefit of weight loss to improve her health. She was advised of the need for long term treatment and the importance of lifestyle modifications.  AGREE: Multiple dietary modification options and treatment options were discussed and  Raziyah agreed to follow the Category 2 plan We discussed the following Behavioral Modification Strategies today: increasing lean protein intake and work on meal planning and easy cooking plans

## 2017-04-02 NOTE — Progress Notes (Signed)
   HPI  Patient presents today for follow-up for chronic medical conditions  Hypertension Doing well with current medications, using 20 mg lisinopril. No headache or chest pain.  Super obesity Patient is happy with her current weight loss, she is very excited because she will likely be able to have weight loss surgery next month. She continues to pursue lifestyle modification in the meantime  PMH: Smoking status noted ROS: Per HPI  Objective: BP 134/80   Pulse 100   Temp 97.6 F (36.4 C) (Oral)   Ht 5' 5.75" (1.67 m)   Wt (!) 385 lb 8 oz (174.9 kg)   LMP 03/05/2017 (Approximate)   BMI 62.70 kg/m  Gen: NAD, alert, cooperative with exam HEENT: NCAT CV: RRR, good S1/S2, no murmur Resp: CTABL, no wheezes, non-labored Neuro: Alert and oriented, No gross deficits  Assessment and plan:  #Superobesity Patient with approximately 20 pound weight loss over the last few months. No changes Continue Victoza  #Hypertension Well-controlled, no changes Tachycardia is long-standing and improved quite a bit with just sitting for about 5 minutes.  Suspect  exercise-induced tachycardia    Murtis SinkSam Bradshaw, MD [facility] 04/02/2017, 2:15 PM

## 2017-04-06 ENCOUNTER — Ambulatory Visit: Payer: BLUE CROSS/BLUE SHIELD | Admitting: Family Medicine

## 2017-04-16 ENCOUNTER — Other Ambulatory Visit (INDEPENDENT_AMBULATORY_CARE_PROVIDER_SITE_OTHER): Payer: Self-pay | Admitting: Family Medicine

## 2017-04-16 ENCOUNTER — Ambulatory Visit (INDEPENDENT_AMBULATORY_CARE_PROVIDER_SITE_OTHER): Payer: BLUE CROSS/BLUE SHIELD | Admitting: Family Medicine

## 2017-04-16 VITALS — BP 141/80 | HR 90 | Temp 97.6°F | Ht 65.0 in | Wt 377.0 lb

## 2017-04-16 DIAGNOSIS — Z6841 Body Mass Index (BMI) 40.0 and over, adult: Secondary | ICD-10-CM

## 2017-04-16 DIAGNOSIS — F3289 Other specified depressive episodes: Secondary | ICD-10-CM | POA: Diagnosis not present

## 2017-04-16 DIAGNOSIS — E559 Vitamin D deficiency, unspecified: Secondary | ICD-10-CM | POA: Diagnosis not present

## 2017-04-16 MED ORDER — VITAMIN D (ERGOCALCIFEROL) 1.25 MG (50000 UNIT) PO CAPS: 50000.0000 [IU] | ORAL_CAPSULE | ORAL | 1 refills | Status: DC

## 2017-04-16 MED ORDER — BUPROPION HCL ER (SR) 150 MG PO TB12: 150.0000 mg | ORAL_TABLET | Freq: Every day | ORAL | 1 refills | Status: DC

## 2017-04-16 NOTE — Progress Notes (Signed)
Office: [phone]  /  Fax: [phone]   HPI:   Chief Complaint: OBESITY Cassandra Tucker is here to discuss her progress with her obesity treatment plan. She is on the Category 2 plan and is following her eating plan approximately 60 % of the time. She states she is exercising 0 minutes 0 times per week. Cassandra Tucker continues to do well with weight loss and she is getting ready for weight loss surgery later this month. She is worried about her 2 week liver shrinking diet and has not had her last pre-op diet visit. Her weight is (!) 377 lb (171 kg) today and has had a weight loss of 2 pounds over a period of 2 weeks since her last visit. She has lost 10 lbs since starting treatment with [place].  Vitamin D deficiency Cassandra Tucker has a diagnosis of vitamin D deficiency. She is currently stable on vit D and is not yet at goal. Fatigue is improving and she denies nausea, vomiting or muscle weakness.  Depression with emotional eating behaviors Cassandra Tucker is currently on wellbutrin to help decrease emotional eating. She is ready to have weight loss surgery and wants to know if she should stay on this. Cassandra Tucker struggles with emotional eating and using food for comfort to the extent that it is negatively impacting her health. She often snacks when she is not hungry. Cassandra Tucker sometimes feels she is out of control and then feels guilty that she made poor food choices. She has been working on behavior modification techniques to help reduce her emotional eating and has been somewhat successful. She shows no sign of suicidal or homicidal ideations.  Depression screen Cassandra Tucker Tucker Tucker Tucker Tucker Tucker  Decreased Interest 0 0 2 0 0  Down, Depressed, Hopeless 0 0 2 0 0  PHQ - 2 Score 0 0 4 0 0  Altered sleeping - - 1 - -  Tired, decreased energy - - 3 - -  Change in appetite - - 2 - -  Feeling bad or failure about yourself  - - 3 - -  Trouble concentrating - - 2 - -  Moving slowly or  fidgety/restless - - 2 - -  Suicidal thoughts - - 1 - -  PHQ-9 Score - - 18 - -  Difficult doing work/chores - - Very difficult - -      ALLERGIES: No Known Allergies  MEDICATIONS: Current Outpatient Medications on File Prior to Visit  Medication Sig Dispense Refill  . acetaZOLAMIDE (DIAMOX) 500 MG capsule TAKE 1 CAPSULE THREE TIMES A DAY 90 capsule 6  . albuterol (PROVENTIL HFA;VENTOLIN HFA) 108 (90 Base) MCG/ACT inhaler Inhale 2 puffs into the lungs every 6 (six) hours as needed for wheezing or shortness of breath. 1 Inhaler 1  . buPROPion (WELLBUTRIN SR) 150 MG 12 hr tablet Take 1 tablet (150 mg total) by mouth daily with breakfast. 30 tablet 0  . hydrochlorothiazide (HYDRODIURIL) 12.5 MG tablet Take 1 tablet (12.5 mg total) by mouth daily. 90 tablet 3  . ibuprofen (ADVIL,MOTRIN) 200 MG tablet Take 400 mg by mouth every 6 (six) hours as needed for headache or moderate pain.     . Insulin Pen Needle (PEN NEEDLES) 32G X 4 MM MISC 1 each by Does not apply route daily. 100 each 1  . liraglutide (VICTOZA) 18 MG/3ML SOPN Inject 0.2 mLs (1.2 mg total) into the skin daily. 18 mL 0  . lisinopril (PRINIVIL,ZESTRIL) 20 MG tablet Take 1 tablet (20 mg total) by mouth  daily. 90 tablet 3  . omeprazole (PRILOSEC) 40 MG capsule TAKE 1 CAPSULE (40 MG TOTAL) BY MOUTH DAILY. 30 capsule 4  . Vitamin D, Ergocalciferol, (DRISDOL) 50000 units CAPS capsule Take 1 capsule (50,000 Units total) by mouth every 7 (seven) days. 4 capsule 0   No current facility-administered medications on file prior to visit.     PAST MEDICAL HISTORY: Past Medical History:  Diagnosis Date  . Ankle fracture, left 2000  . Anxiety   . Arthritis    left wrist and left ankle  . Back pain   . Bronchitis   . Diabetes (HCC) 01/11/2016  . GERD (gastroesophageal reflux disease)   . HLD (hyperlipidemia)   . Hypertension 2013  . Intracranial hypertension    psuedotumor cebrum  . Joint pain   . Kienbock's disease   . Leg edema     . OSA (obstructive sleep apnea)   . Papilledema   . RLS (restless legs syndrome)   . Sinus tachycardia     PAST SURGICAL HISTORY: Past Surgical History:  Procedure Laterality Date  . FRACTURE SURGERY  2005   ankle  . FRACTURE SURGERY  2013   wrist (deteriorating bone)  . WRIST SURGERY  2015    SOCIAL HISTORY: Social History   Tobacco Use  . Smoking status: Never Smoker  . Smokeless tobacco: Never Used  Substance Use Topics  . Alcohol use: Yes    Alcohol/week: 0.0 oz    Comment: socially  . Drug use: No    FAMILY HISTORY: Family History  Problem Relation Age of Onset  . Heart attack Mother   . Hypertension Mother   . Obesity Mother   . Depression Mother   . Anxiety disorder Mother   . Bipolar disorder Mother   . Cancer Father   . Migraines Neg Hx     ROS: Review of Systems  Constitutional: Positive for malaise/fatigue and weight loss.  Gastrointestinal: Negative for nausea and vomiting.  Musculoskeletal:       Negative muscle weakness  Psychiatric/Behavioral: Positive for depression. Negative for suicidal ideas.    PHYSICAL EXAM: Blood pressure (!) 141/80, pulse 90, temperature 97.6 F (36.4 C), temperature source Oral, height 5\' 5"  (1.651 m), weight (!) 377 lb (171 kg), last menstrual period 03/08/2017, SpO2 100 %. Body mass index is 62.74 kg/m. Physical Exam  Constitutional: She is oriented to person, place, and time. She appears well-developed and well-nourished.  Cardiovascular: Normal rate.  Pulmonary/Chest: Effort normal.  Musculoskeletal: Normal range of motion.  Neurological: She is oriented to person, place, and time.  Skin: Skin is warm and dry.  Psychiatric: She has a normal mood and affect. Her behavior is normal.  Vitals reviewed.   RECENT LABS AND TESTS: BMET    Component Value Date/Time   NA 140 02/14/2017 1129   K 4.3 02/14/2017 1129   CL 104 02/14/2017 1129   CO2 23 02/14/2017 1129   GLUCOSE 92 02/14/2017 1129   GLUCOSE 89  07/13/2015 2052   BUN 12 02/14/2017 1129   CREATININE 0.63 02/14/2017 1129   CALCIUM 9.5 02/14/2017 1129   GFRNONAA 125 02/14/2017 1129   GFRAA 144 02/14/2017 1129   Lab Results  Component Value Date   HGBA1C 6.0 (H) 02/14/2017   Lab Results  Component Value Date   INSULIN 60.5 (H) 02/14/2017   CBC    Component Value Date/Time   WBC 9.6 02/14/2017 1129   RBC 4.88 02/14/2017 1129   HGB 12.6 02/14/2017 1129  HCT 39.2 02/14/2017 1129   PLT 252 02/07/2016 0923   MCV 80 02/14/2017 1129   MCH 25.8 (L) 02/14/2017 1129   MCHC 32.1 02/14/2017 1129   RDW 15.2 02/14/2017 1129   LYMPHSABS 3.1 02/14/2017 1129   EOSABS 0.5 (H) 02/14/2017 1129   BASOSABS 0.0 02/14/2017 1129   Iron/TIBC/Ferritin/ %Sat    Component Value Date/Time   FERRITIN 45 02/07/2016 0923   Lipid Panel     Component Value Date/Time   CHOL 185 02/14/2017 1129   TRIG 172 (H) 02/14/2017 1129   HDL 32 (L) 02/14/2017 1129   CHOLHDL 5.3 (H) 11/14/2016 1127   LDLCALC 119 (H) 02/14/2017 1129   Hepatic Function Panel     Component Value Date/Time   PROT 6.9 02/14/2017 1129   ALBUMIN 4.2 02/14/2017 1129   AST 17 02/14/2017 1129   ALT 28 02/14/2017 1129   ALKPHOS 53 02/14/2017 1129   BILITOT 0.6 02/14/2017 1129      Component Value Date/Time   TSH 1.650 02/14/2017 1129   TSH 3.280 11/14/2016 1127   TSH 2.510 01/07/2016 1140    ASSESSMENT AND PLAN: Other depression - with emotional eating  Vitamin D deficiency - Plan: Vitamin D, Ergocalciferol, (DRISDOL) 50000 units CAPS capsule  Class 3 severe obesity with serious comorbidity and body mass index (BMI) of 60.0 to 69.9 in adult, unspecified obesity type (HCC)  PLAN:  Vitamin D Deficiency Cassandra Tucker was informed that low vitamin D levels contributes to fatigue and are associated with obesity, breast, and colon cancer. She agrees to continue to take prescription Vit D @50 ,000 IU every week, we will refill for 2 months and will follow up for routine testing  of vitamin D, at least 2-3 times per year. She was informed of the risk of over-replacement of vitamin D and agrees to not increase her dose unless he discusses this with [place] first. Cassandra Tucker agrees to follow up with our clinic as needed.  Depression with Emotional Eating Behaviors We discussed behavior modification techniques today to help Cassandra Tucker deal with her emotional eating and depression. She has agreed to continue Wellbutrin SR 150 mg qd, we will refill for 2 months and she is to stay on this medication through at least 3 months post-op. Nada agreed to follow up as needed.  Obesity Tamber is currently in the action stage of change. As such, her goal is to continue with weight loss efforts She has agreed to follow the Category 2 plan Ellouise has been instructed to work up to a goal of 150 minutes of combined cardio and strengthening exercise per week for weight loss and overall health benefits. We discussed the following Behavioral Modification Strategies today: increase H2O intake, keeping healthy foods in the home, decreasing simple carbohydrates and holiday eating strategies  Mattison may see [place] after weight loss surgery. She was recommended to wait until 6 week post-op to reschedule. She will follow with same plan until she starts her pre-op diet.  Anikka has agreed to follow up with our clinic as needed. She was informed of the importance of frequent follow up visits to maximize her success with intensive lifestyle modifications for her multiple health conditions.  I, [place] Crane, am acting as transcriptionist for Quillian Quince, MD  I have reviewed the above documentation for accuracy and completeness, and I agree with the above. -Quillian Quince, MD   OBESITY BEHAVIORAL INTERVENTION VISIT  Today's visit was # 5 out of 22.  Starting weight: 387 lbs Starting date: 02/14/17  Today's weight : 377 lbs  Today's date: 04/16/2017 Total lbs lost to date: 10 (Patients must lose 7  lbs in the first 6 months to continue with counseling)   ASK: We discussed the diagnosis of obesity with Tecora M Hickman today and Latressa agreed to give us permission to discuss obesity behavioral modification therapy today.  ASSESS: Seleena has the diagnosis of obesity and her BMI today is 6862.74 [place] is in the action stage of change   ADVISE: Jaleya was educated on the multiple health risks of obesity as well as the benefit of weight loss to improve her health. She was advised of the need for long term treatment and the importance of lifestyle modifications.  AGREE: Multiple dietary modification options and treatment options were discussed and  Destane agreed to follow the Category 2 plan We discussed the following Behavioral Modification Strategies today: increase H2O intake, keeping healthy foods in the home, decreasing simple carbohydrates and holiday eating strategies

## 2017-04-27 NOTE — Progress Notes (Signed)
Need orders in epic for 11-27- surgery

## 2017-04-30 NOTE — Progress Notes (Signed)
Please place orders in Epic as patient has a pre-op appointment on 05/01/2017! Thank you! 

## 2017-05-01 ENCOUNTER — Encounter (HOSPITAL_COMMUNITY)
Admission: RE | Admit: 2017-05-01 | Discharge: 2017-05-01 | Disposition: A | Discharge status: Home / Self Care | Payer: BLUE CROSS/BLUE SHIELD | Source: Ambulatory Visit | Attending: Surgery | Admitting: Surgery

## 2017-05-01 ENCOUNTER — Other Ambulatory Visit: Payer: Self-pay

## 2017-05-01 ENCOUNTER — Encounter (HOSPITAL_COMMUNITY): Payer: Self-pay

## 2017-05-01 ENCOUNTER — Other Ambulatory Visit: Payer: Self-pay | Admitting: Nurse Practitioner

## 2017-05-01 DIAGNOSIS — Z01812 Encounter for preprocedural laboratory examination: Secondary | ICD-10-CM | POA: Diagnosis present

## 2017-05-01 HISTORY — DX: Headache: R51

## 2017-05-01 HISTORY — DX: Personal history of urinary calculi: Z87.442

## 2017-05-01 HISTORY — DX: Headache, unspecified: R51.9

## 2017-05-01 HISTORY — DX: Family history of other specified conditions: Z84.89

## 2017-05-01 LAB — COMPREHENSIVE METABOLIC PANEL
ALBUMIN: 3.6 g/dL (ref 3.5–5.0)
ALK PHOS: 59 U/L (ref 38–126)
ALT: 18 U/L (ref 14–54)
AST: 20 U/L (ref 15–41)
Anion gap: 7 (ref 5–15)
BILIRUBIN TOTAL: 0.7 mg/dL (ref 0.3–1.2)
BUN: 11 mg/dL (ref 6–20)
CALCIUM: 8.9 mg/dL (ref 8.9–10.3)
CO2: 27 mmol/L (ref 22–32)
Chloride: 105 mmol/L (ref 101–111)
Creatinine, Ser: 0.61 mg/dL (ref 0.44–1.00)
GFR calc Af Amer: >60 mL/min (ref >60)
GFR calc non Af Amer: >60 mL/min (ref >60)
GLUCOSE: 160 mg/dL — AB (ref 65–99)
Potassium: 4.1 mmol/L (ref 3.5–5.1)
Sodium: 139 mmol/L (ref 135–145)
TOTAL PROTEIN: 6.8 g/dL (ref 6.5–8.1)

## 2017-05-01 LAB — CBC WITH DIFFERENTIAL/PLATELET
Basophils Absolute: 0 10*3/uL (ref 0.0–0.1)
Basophils Relative: 0 %
EOS ABS: 0.4 10*3/uL (ref 0.0–0.7)
EOS PCT: 5 %
HEMATOCRIT: 39.6 % (ref 36.0–46.0)
HEMOGLOBIN: 12 g/dL (ref 12.0–15.0)
LYMPHS ABS: 2.5 10*3/uL (ref 0.7–4.0)
LYMPHS PCT: 30 %
MCH: 25.1 pg — ABNORMAL LOW (ref 26.0–34.0)
MCHC: 30.3 g/dL (ref 30.0–36.0)
MCV: 82.7 fL (ref 78.0–100.0)
MONO ABS: 0.3 10*3/uL (ref 0.1–1.0)
MONOS PCT: 4 %
Neutro Abs: 5 10*3/uL (ref 1.7–7.7)
Neutrophils Relative %: 61 %
Platelets: 230 10*3/uL (ref 150–400)
RBC: 4.79 MIL/uL (ref 3.87–5.11)
RDW: 14.8 % (ref 11.5–15.5)
WBC: 8.3 10*3/uL (ref 4.0–10.5)

## 2017-05-01 LAB — GLUCOSE, CAPILLARY: Glucose-Capillary: 161 mg/dL — ABNORMAL HIGH (ref 65–99)

## 2017-05-01 LAB — HEMOGLOBIN A1C
Hgb A1c MFr Bld: 5.6 % (ref 4.8–5.6)
Mean Plasma Glucose: 114.02 mg/dL

## 2017-05-01 NOTE — Progress Notes (Signed)
EKG-02/14/17-epic, CXR-03/07/17-epic

## 2017-05-01 NOTE — Progress Notes (Signed)
Need orders in epic.  Surgery on 05/08/17.

## 2017-05-01 NOTE — Patient Instructions (Signed)
Chantia M Hickman  05/01/2017   Your procedure is scheduled on: 05/08/2017    [facility]  Entrance Take [place]  elevators to 3rd floor to  Short Stay Center at    0930 AM.    Call this number if you have problems the morning of surgery [phone]    Remember: ONLY 1 PERSON MAY GO WITH YOU TO SHORT STAY TO GET  READY MORNING OF YOUR SURGERY.  Do not eat food or drink liquids :After Midnight.     Take these medicines the morning of surgery with A SIP OF WATER: Inhalers as usual and bring, Wellbutrin, Prilosec  DO NOT TAKE ANY DIABETIC MEDICATIONS DAY OF YOUR SURGERY                               You may not have any metal on your body including hair pins and              piercings  Do not wear jewelry, make-up, lotions, powders or perfumes, deodorant             Do not wear nail polish.  Do not shave  48 hours prior to surgery.                Do not bring valuables to the hospital.  IS NOT             RESPONSIBLE   FOR VALUABLES.  Contacts, dentures or bridgework may not be worn into surgery.  Leave suitcase in the car. After surgery it may be brought to your room.                      Please read over the following fact sheets you were given: _____________________________________________________________________             [facility] - Preparing [facility] Before surgery, you can play an important role.  Because skin is not sterile, your skin needs to be as free of germs as possible.  You can reduce the number of germs on your skin by washing with CHG (chlorahexidine gluconate) soap before surgery.  CHG is an antiseptic cleaner which kills germs and bonds with the skin to continue killing germs even after washing. Please DO NOT use if you have an allergy to CHG or antibacterial soaps.  If your skin becomes reddened/irritated stop using the CHG and inform your nurse when you arrive at Short Stay. Do not shave (including  legs and underarms) for at least 48 hours prior to the first CHG shower.  You may shave your face/neck. Please follow these instructions carefully:  1.  Shower with CHG Soap the night before surgery and the  morning of Surgery.  2.  If you choose to wash your hair, wash your hair first as usual with your  normal  shampoo.  3.  After you shampoo, rinse your hair and body thoroughly to remove the  shampoo.                           4.  Use CHG as you would any other liquid soap.  You can apply chg directly  to the skin and wash  Gently with a scrungie or clean washcloth.  5.  Apply the CHG Soap to your body ONLY FROM THE NECK DOWN.   Do not use on face/ open                           Wound or open sores. Avoid contact with eyes, ears mouth and genitals (private parts).                       Wash face,  Genitals (private parts) with your normal soap.             6.  Wash thoroughly, paying special attention to the area where your surgery  will be performed.  7.  Thoroughly rinse your body with warm water from the neck down.  8.  DO NOT shower/wash with your normal soap after using and rinsing off  the CHG Soap.                9.  Pat yourself dry with a clean towel.            10.  Wear clean pajamas.            11.  Place clean sheets on your bed the night of your first shower and do not  sleep with pets. Day of Surgery : Do not apply any lotions/deodorants the morning of surgery.  Please wear clean clothes to the hospital/surgery center.  FAILURE TO FOLLOW THESE INSTRUCTIONS MAY RESULT IN THE CANCELLATION OF YOUR SURGERY PATIENT SIGNATURE_________________________________  NURSE SIGNATURE__________________________________  ________________________________________________________________________

## 2017-05-01 NOTE — Progress Notes (Signed)
Message requesting orders was sent to Dr Andrey CampanileWilson in error.

## 2017-05-02 ENCOUNTER — Ambulatory Visit: Payer: Self-pay | Admitting: Surgery

## 2017-05-02 NOTE — H&P (Signed)
H&P  CC: morbid obesity  HPI: Shan presents today for her preoperative appointment.  She has completed the preoperative workup including an upper GI and chest x-ray which were negative as listed below.  She has been meeting with Dr. Leafy Ro regularly and has been making some progress on the behavioral aspect of her weight loss she is been meeting with the dietitian has met with his psychologist as well and has been cleared by both parties 32.  Appropriate candidate from their standpoint.  From initial visit (Sept 13): This is a very pleasant 26 year old woman who presents here today with her grandmother to discuss bariatric surgery. She has been struggling with her weight for her entire life and has been gradually gaining weight over time. Her heaviest weight was 425 pounds. She frequently skips meals and has lots of liquid calories, frequent poor food choices and poor satiety registry. 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 She has a history of sleep apnea but is not compliant with her CPAP machine. She has type 2 diabetes on Victoza as well as hypertension on 2 agents. She also reports intermittent GERD and takes antacids on a when necessary basis maybe once every few weeks. She also has intracranial hypertension. She met with Dr. Leafy Ro about a week ago and is initiating care and is really dedicated to making changes in her lifestyle. She is interested in sleeve gastrectomy. She has done a lot of research on the topic and is well aware of the differences between sleeve and bypass anticipated outcomes.  Lost her mother to a major cardiac event in April of last year. Her mother was obese as well.  The patient is a 26 year old female who presents for a bariatric surgery evaluation. Associated symptoms include depressed mood, poor self esteem, lost range of motion, joint pains and heartburn. Initial onset of obesity was in childhood.  Disease complications include diabetes, hypertension and sleep apnea. Current diet includes well balanced meals. less than once per week. Limitations to weight loss are sedentary lifestyle and depression. Pertinent family history includes obesity and diabetes. Past treatment has included weight loss group and diet counselling. Cardiac history: The patient has sleep apnea, and patient denies smoking. Gastrointestinal History: Patient has heartburn, and denies dysphagia or irritable bowel disease. MBSQIP recognized comorbidities: Patient has hypertension, diabetes mellitus, sleep apnea and gastroesophageal reflux disease.  No Known Allergies  Past Medical History:  Diagnosis Date  . Ankle fracture, left 2000  . Anxiety   . Arthritis    left wrist and left ankle  . Back pain   . Bronchitis   . Diabetes ([place]) 01/11/2016   type II   . Family history of adverse reaction to anesthesia    gmother had problems with N/V   . GERD (gastroesophageal reflux disease)   . Headache   . History of kidney stones   . HLD (hyperlipidemia)   . Hypertension 2013  . Intracranial hypertension    psuedotumor cebrum  . Joint pain   . Kienbock's disease   . Leg edema   . OSA (obstructive sleep apnea)    cpap - does not know settings   . Papilledema   . RLS (restless legs syndrome)   . Sinus tachycardia     Past Surgical History:  Procedure Laterality Date  . FRACTURE SURGERY  2005   ankle  . FRACTURE SURGERY  2013   wrist (deteriorating bone)  .  WRIST SURGERY  2015    Family History  Problem Relation Age of Onset  . Heart attack Mother   . Hypertension Mother   . Obesity Mother   . Depression Mother   . Anxiety disorder Mother   . Bipolar disorder Mother   . Cancer Father   . Migraines Neg Hx     Social History   Socioeconomic History  . Marital status: Single    Spouse name: Not on file  . Number of children: 0  . Years of education: 12+  . Highest education level: Not on file   Social Needs  . Financial resource strain: Not on file  . Food insecurity - worry: Not on file  . Food insecurity - inability: Not on file  . Transportation needs - medical: Not on file  . Transportation needs - non-medical: Not on file  Occupational History  . Occupation: Research scientist (physical sciences): [employer]  Tobacco Use  . Smoking status: Never Smoker  . Smokeless tobacco: Never Used  Substance and Sexual Activity  . Alcohol use: Yes    Alcohol/week: 0.0 oz    Comment: socially  . Drug use: No  . Sexual activity: Not on file  Other Topics Concern  . Not on file  Social History Narrative   Lives with grandparents   Caffeine use: Drinks coffee/tea/soda- 20oz per day (none after starting diamox)    Current Outpatient Medications on File Prior to Visit  Medication Sig Dispense Refill  . acetaZOLAMIDE (DIAMOX) 500 MG capsule TAKE 1 CAPSULE THREE TIMES A DAY 90 capsule 6  . albuterol (PROVENTIL HFA;VENTOLIN HFA) 108 (90 Base) MCG/ACT inhaler Inhale 2 puffs into the lungs every 6 (six) hours as needed for wheezing or shortness of breath. 1 Inhaler 1  . buPROPion (WELLBUTRIN SR) 150 MG 12 hr tablet Take 1 tablet (150 mg total) daily with breakfast by mouth. 30 tablet 1  . hydrochlorothiazide (HYDRODIURIL) 12.5 MG tablet Take 1 tablet (12.5 mg total) by mouth daily. 90 tablet 3  . ibuprofen (ADVIL,MOTRIN) 200 MG tablet Take 400 mg by mouth every 6 (six) hours as needed for headache or moderate pain.     . Insulin Pen Needle (PEN NEEDLES) 32G X 4 MM MISC 1 each by Does not apply route daily. (Patient not taking: Reported on 04/30/2017) 100 each 1  . lisinopril (PRINIVIL,ZESTRIL) 20 MG tablet Take 1 tablet (20 mg total) by mouth daily. 90 tablet 3  . omeprazole (PRILOSEC) 40 MG capsule TAKE 1 CAPSULE (40 MG TOTAL) BY MOUTH DAILY. 30 capsule 4  . VICTOZA 18 MG/3ML SOPN INJECT 0.2 MLS (1.2 MG TOTAL) INTO THE SKIN DAILY. 18 pen 0  . Vitamin D, Ergocalciferol, (DRISDOL) 50000 units CAPS  capsule Take 1 capsule (50,000 Units total) every 7 (seven) days by mouth. (Patient taking differently: Take 50,000 Units every Monday by mouth. ) 4 capsule 1   No current facility-administered medications on file prior to visit.     Review of Systems: a complete, 10pt review of systems was completed with pertinent positives and negatives as documented in the HPI  Physical Exam:There were no vitals filed for this visit. Gen: A&Ox3, no distress Head: normocephalic, atraumatic, EOMI, anicteric.  Neck: supple without mass or thyromegaly Chest: unlabored respirations, symmetrical air entry   Cardiovascular: RRR with palpable distal pulses, no pedal edema Abdomen: soft, nondistended, nontender. No mass or organomegaly.  Extremities: warm, without edema, no deformities  Neuro: grossly intact Psych: appropriate mood and  affect  Skin: warm and dry   CBC Latest Ref Rng & Units 05/01/2017 02/14/2017 02/07/2016  WBC 4.0 - 10.5 K/uL 8.3 9.6 9.6  Hemoglobin 12.0 - 15.0 g/dL 12.0 12.6 11.8  Hematocrit 36.0 - 46.0 % 39.6 39.2 37.5  Platelets 150 - 400 K/uL 230 - 252    CMP Latest Ref Rng & Units 05/01/2017 02/14/2017 11/14/2016  Glucose 65 - 99 mg/dL 160(H) 92 121(H)  BUN 6 - 20 mg/dL _0 Creatinine 0.44 - 1.00 mg/dL 0.61 0.63 0.70  Sodium 135 - 145 mmol/L 139 140 139  Potassium 3.5 - 5.1 mmol/L 4.1 4.3 4.4  Chloride 101 - 111 mmol/L 105 104 98  CO2 22 - 32 mmol/L _1 Calcium 8.9 - 10.3 mg/dL 8.9 9.5 9.9  Total Protein 6.5 - 8.1 g/dL 6.8 6.9 7.0  Total Bilirubin 0.3 - 1.2 mg/dL 0.7 0.6 0.7  Alkaline Phos 38 - 126 U/L 59 53 57  AST 15 - 41 U/L _2 ALT 14 - 54 U/L _3 No results found for: INR, PROTIME  Imaging: UGI sluggish emptying of stomach, otherwise normal CXR negative  A/P: 26 year old woman with morbid obesity and multiple associate comorbidities.  She has completed the preoperative workup and is an excellent candidate for bariatric surgery.  We discussed  again the technique of sleep gastrectomy, the anticipated perioperative recovery, and the risks involved including bleeding, infection, pain, scarring, intra-abdominal injury, staple line leak or abscess, chronic abdominal pain nausea or reflux, hernia, weight regain, immediate risks of general anesthesia including blood clots, pneumonia, stroke, heart attack and death.  After long and detailed discussion, she remains eager to proceed with laparoscopic sleeve gastrectomy.   Romana Juniper, MD [facility], [place] Pager [phone]

## 2017-05-02 NOTE — H&P (View-Only) (Signed)
H&P  CC: morbid obesity  HPI: Cassandra Tucker presents today for her preoperative appointment.  She has completed the preoperative workup including an upper GI and chest x-ray which were negative as listed below.  She has been meeting with Dr. Beasley regularly and has been making some progress on the behavioral aspect of her weight loss she is been meeting with the dietitian has met with his psychologist as well and has been cleared by both parties 15.  Appropriate candidate from their standpoint.  From initial visit (Sept 13): This is a very pleasant 26-year-old woman who presents here today with her grandmother to discuss bariatric surgery. She has been struggling with her weight for her entire life and has been gradually gaining weight over time. Her heaviest weight was 425 pounds. She frequently skips meals and has lots of liquid calories, frequent poor food choices and poor satiety registry. She works at a call center for [employer] and this is a stressful job for her. She has a history of sleep apnea but is not compliant with her CPAP machine. She has type 2 diabetes on Victoza as well as hypertension on 2 agents. She also reports intermittent GERD and takes antacids on a when necessary basis maybe once every few weeks. She also has intracranial hypertension. She met with Dr. Beasley about a week ago and is initiating care and is really dedicated to making changes in her lifestyle. She is interested in sleeve gastrectomy. She has done a lot of research on the topic and is well aware of the differences between sleeve and bypass anticipated outcomes.  Lost her mother to a major cardiac event in April of last year. Her mother was obese as well.  The patient is a 26 year old female who presents for a bariatric surgery evaluation. Associated symptoms include depressed mood, poor self esteem, lost range of motion, joint pains and heartburn. Initial onset of obesity was in childhood.  Disease complications include diabetes, hypertension and sleep apnea. Current diet includes well balanced meals. less than once per week. Limitations to weight loss are sedentary lifestyle and depression. Pertinent family history includes obesity and diabetes. Past treatment has included weight loss group and diet counselling. Cardiac history: The patient has sleep apnea, and patient denies smoking. Gastrointestinal History: Patient has heartburn, and denies dysphagia or irritable bowel disease. MBSQIP recognized comorbidities: Patient has hypertension, diabetes mellitus, sleep apnea and gastroesophageal reflux disease.  No Known Allergies  Past Medical History:  Diagnosis Date  . Ankle fracture, left 2000  . Anxiety   . Arthritis    left wrist and left ankle  . Back pain   . Bronchitis   . Diabetes (HCC) 01/11/2016   type II   . Family history of adverse reaction to anesthesia    gmother had problems with N/V   . GERD (gastroesophageal reflux disease)   . Headache   . History of kidney stones   . HLD (hyperlipidemia)   . Hypertension 2013  . Intracranial hypertension    psuedotumor cebrum  . Joint pain   . Kienbock's disease   . Leg edema   . OSA (obstructive sleep apnea)    cpap - does not know settings   . Papilledema   . RLS (restless legs syndrome)   . Sinus tachycardia     Past Surgical History:  Procedure Laterality Date  . FRACTURE SURGERY  2005   ankle  . FRACTURE SURGERY  2013   wrist (deteriorating bone)  .   WRIST SURGERY  2015    Family History  Problem Relation Age of Onset  . Heart attack Mother   . Hypertension Mother   . Obesity Mother   . Depression Mother   . Anxiety disorder Mother   . Bipolar disorder Mother   . Cancer Father   . Migraines Neg Hx     Social History   Socioeconomic History  . Marital status: Single    Spouse name: Not on file  . Number of children: 0  . Years of education: 12+  . Highest education level: Not on file   Social Needs  . Financial resource strain: Not on file  . Food insecurity - worry: Not on file  . Food insecurity - inability: Not on file  . Transportation needs - medical: Not on file  . Transportation needs - non-medical: Not on file  Occupational History  . Occupation: Customer Service    Employer: [employer]  Tobacco Use  . Smoking status: Never Smoker  . Smokeless tobacco: Never Used  Substance and Sexual Activity  . Alcohol use: Yes    Alcohol/week: 0.0 oz    Comment: socially  . Drug use: No  . Sexual activity: Not on file  Other Topics Concern  . Not on file  Social History Narrative   Lives with grandparents   Caffeine use: Drinks coffee/tea/soda- 20oz per day (none after starting diamox)    Current Outpatient Medications on File Prior to Visit  Medication Sig Dispense Refill  . acetaZOLAMIDE (DIAMOX) 500 MG capsule TAKE 1 CAPSULE THREE TIMES A DAY 90 capsule 6  . albuterol (PROVENTIL HFA;VENTOLIN HFA) 108 (90 Base) MCG/ACT inhaler Inhale 2 puffs into the lungs every 6 (six) hours as needed for wheezing or shortness of breath. 1 Inhaler 1  . buPROPion (WELLBUTRIN SR) 150 MG 12 hr tablet Take 1 tablet (150 mg total) daily with breakfast by mouth. 30 tablet 1  . hydrochlorothiazide (HYDRODIURIL) 12.5 MG tablet Take 1 tablet (12.5 mg total) by mouth daily. 90 tablet 3  . ibuprofen (ADVIL,MOTRIN) 200 MG tablet Take 400 mg by mouth every 6 (six) hours as needed for headache or moderate pain.     . Insulin Pen Needle (PEN NEEDLES) 32G X 4 MM MISC 1 each by Does not apply route daily. (Patient not taking: Reported on 04/30/2017) 100 each 1  . lisinopril (PRINIVIL,ZESTRIL) 20 MG tablet Take 1 tablet (20 mg total) by mouth daily. 90 tablet 3  . omeprazole (PRILOSEC) 40 MG capsule TAKE 1 CAPSULE (40 MG TOTAL) BY MOUTH DAILY. 30 capsule 4  . VICTOZA 18 MG/3ML SOPN INJECT 0.2 MLS (1.2 MG TOTAL) INTO THE SKIN DAILY. 18 pen 0  . Vitamin D, Ergocalciferol, (DRISDOL) 50000 units CAPS  capsule Take 1 capsule (50,000 Units total) every 7 (seven) days by mouth. (Patient taking differently: Take 50,000 Units every Monday by mouth. ) 4 capsule 1   No current facility-administered medications on file prior to visit.     Review of Systems: a complete, 10pt review of systems was completed with pertinent positives and negatives as documented in the HPI  Physical Exam:There were no vitals filed for this visit. Gen: A&Ox3, no distress Head: normocephalic, atraumatic, EOMI, anicteric.  Neck: supple without mass or thyromegaly Chest: unlabored respirations, symmetrical air entry   Cardiovascular: RRR with palpable distal pulses, no pedal edema Abdomen: soft, nondistended, nontender. No mass or organomegaly.  Extremities: warm, without edema, no deformities  Neuro: grossly intact Psych: appropriate mood and   affect  Skin: warm and dry   CBC Latest Ref Rng & Units 05/01/2017 02/14/2017 02/07/2016  WBC 4.0 - 10.5 K/uL 8.3 9.6 9.6  Hemoglobin 12.0 - 15.0 g/dL 12.0 12.6 11.8  Hematocrit 36.0 - 46.0 % 39.6 39.2 37.5  Platelets 150 - 400 K/uL 230 - 252    CMP Latest Ref Rng & Units 05/01/2017 02/14/2017 11/14/2016  Glucose 65 - 99 mg/dL 160(H) 92 121(H)  BUN 6 - 20 mg/dL 11 12 12  Creatinine 0.44 - 1.00 mg/dL 0.61 0.63 0.70  Sodium 135 - 145 mmol/L 139 140 139  Potassium 3.5 - 5.1 mmol/L 4.1 4.3 4.4  Chloride 101 - 111 mmol/L 105 104 98  CO2 22 - 32 mmol/L 27 23 25  Calcium 8.9 - 10.3 mg/dL 8.9 9.5 9.9  Total Protein 6.5 - 8.1 g/dL 6.8 6.9 7.0  Total Bilirubin 0.3 - 1.2 mg/dL 0.7 0.6 0.7  Alkaline Phos 38 - 126 U/L 59 53 57  AST 15 - 41 U/L 20 17 17  ALT 14 - 54 U/L 18 28 25    No results found for: INR, PROTIME  Imaging: UGI sluggish emptying of stomach, otherwise normal CXR negative  A/P: 26-year-old woman with morbid obesity and multiple associate comorbidities.  She has completed the preoperative workup and is an excellent candidate for bariatric surgery.  We discussed  again the technique of sleep gastrectomy, the anticipated perioperative recovery, and the risks involved including bleeding, infection, pain, scarring, intra-abdominal injury, staple line leak or abscess, chronic abdominal pain nausea or reflux, hernia, weight regain, immediate risks of general anesthesia including blood clots, pneumonia, stroke, heart attack and death.  After long and detailed discussion, she remains eager to proceed with laparoscopic sleeve gastrectomy.   Cassandra Curnow, MD [facility] Pager [phone]  

## 2017-05-08 ENCOUNTER — Inpatient Hospital Stay (HOSPITAL_COMMUNITY): Payer: BLUE CROSS/BLUE SHIELD | Admitting: Certified Registered Nurse Anesthetist

## 2017-05-08 ENCOUNTER — Encounter (HOSPITAL_COMMUNITY)
Admission: RE | Disposition: A | Discharge status: Home / Self Care | Payer: Self-pay | Source: Ambulatory Visit | Attending: Surgery

## 2017-05-08 ENCOUNTER — Inpatient Hospital Stay (HOSPITAL_COMMUNITY)
Admission: RE | Admit: 2017-05-08 | Discharge: 2017-05-09 | DRG: 621 | Disposition: A | Discharge status: Home / Self Care | Payer: BLUE CROSS/BLUE SHIELD | Source: Ambulatory Visit | Attending: Surgery | Admitting: Surgery

## 2017-05-08 ENCOUNTER — Other Ambulatory Visit: Payer: Self-pay

## 2017-05-08 ENCOUNTER — Encounter (HOSPITAL_COMMUNITY): Payer: Self-pay | Admitting: *Deleted

## 2017-05-08 DIAGNOSIS — E785 Hyperlipidemia, unspecified: Secondary | ICD-10-CM | POA: Diagnosis present

## 2017-05-08 DIAGNOSIS — Z9119 Patient's noncompliance with other medical treatment and regimen: Secondary | ICD-10-CM | POA: Diagnosis not present

## 2017-05-08 DIAGNOSIS — F329 Major depressive disorder, single episode, unspecified: Secondary | ICD-10-CM | POA: Diagnosis present

## 2017-05-08 DIAGNOSIS — Z794 Long term (current) use of insulin: Secondary | ICD-10-CM

## 2017-05-08 DIAGNOSIS — E119 Type 2 diabetes mellitus without complications: Secondary | ICD-10-CM | POA: Diagnosis present

## 2017-05-08 DIAGNOSIS — E669 Obesity, unspecified: Secondary | ICD-10-CM

## 2017-05-08 DIAGNOSIS — G2581 Restless legs syndrome: Secondary | ICD-10-CM | POA: Diagnosis present

## 2017-05-08 DIAGNOSIS — G932 Benign intracranial hypertension: Secondary | ICD-10-CM | POA: Diagnosis present

## 2017-05-08 DIAGNOSIS — G4733 Obstructive sleep apnea (adult) (pediatric): Secondary | ICD-10-CM | POA: Diagnosis present

## 2017-05-08 DIAGNOSIS — K219 Gastro-esophageal reflux disease without esophagitis: Secondary | ICD-10-CM | POA: Diagnosis present

## 2017-05-08 DIAGNOSIS — Z6841 Body Mass Index (BMI) 40.0 and over, adult: Secondary | ICD-10-CM

## 2017-05-08 DIAGNOSIS — Z79899 Other long term (current) drug therapy: Secondary | ICD-10-CM

## 2017-05-08 HISTORY — PX: UPPER GI ENDOSCOPY: SHX6162

## 2017-05-08 HISTORY — PX: LAPAROSCOPIC GASTRIC SLEEVE RESECTION: SHX5895

## 2017-05-08 LAB — GLUCOSE, CAPILLARY: GLUCOSE-CAPILLARY: 113 mg/dL — AB (ref 65–99)

## 2017-05-08 LAB — PREGNANCY, URINE: Preg Test, Ur: NEGATIVE

## 2017-05-08 SURGERY — GASTRECTOMY, SLEEVE, LAPAROSCOPIC
Anesthesia: General | Site: Abdomen

## 2017-05-08 MED ORDER — FENTANYL CITRATE (PF) 100 MCG/2ML IJ SOLN
INTRAMUSCULAR | Status: AC
  Administered 2017-05-08: 50 ug via INTRAVENOUS
  Filled 2017-05-08: qty 2

## 2017-05-08 MED ORDER — LACTATED RINGERS IV SOLN
INTRAVENOUS | Status: DC | PRN
  Administered 2017-05-08 (×2): via INTRAVENOUS

## 2017-05-08 MED ORDER — ROCURONIUM BROMIDE 50 MG/5ML IV SOSY
PREFILLED_SYRINGE | INTRAVENOUS | Status: AC
  Filled 2017-05-08: qty 5

## 2017-05-08 MED ORDER — BUPIVACAINE-EPINEPHRINE 0.25% -1:200000 IJ SOLN
INTRAMUSCULAR | Status: AC
  Filled 2017-05-08: qty 1

## 2017-05-08 MED ORDER — CHLORHEXIDINE GLUCONATE 4 % EX LIQD: 60.0000 mL | Freq: Once | CUTANEOUS | Status: DC

## 2017-05-08 MED ORDER — PREMIER PROTEIN SHAKE
2.0000 [oz_av] | ORAL | Status: DC
  Administered 2017-05-09 (×2): 2 [oz_av] via ORAL

## 2017-05-08 MED ORDER — SODIUM CHLORIDE 0.9 % IV SOLN
INTRAVENOUS | Status: DC
Start: 2017-05-08 — End: 2017-05-09
  Administered 2017-05-08: 18:00:00 via INTRAVENOUS

## 2017-05-08 MED ORDER — ONDANSETRON HCL 4 MG/2ML IJ SOLN
INTRAMUSCULAR | Status: AC
  Filled 2017-05-08: qty 2

## 2017-05-08 MED ORDER — ENOXAPARIN SODIUM 30 MG/0.3ML ~~LOC~~ SOLN
30.0000 mg | Freq: Two times a day (BID) | SUBCUTANEOUS | Status: DC
  Administered 2017-05-08 – 2017-05-09 (×2): 30 mg via SUBCUTANEOUS
  Filled 2017-05-08 (×2): qty 0.3

## 2017-05-08 MED ORDER — LIDOCAINE 2% (20 MG/ML) 5 ML SYRINGE
INTRAMUSCULAR | Status: AC
  Filled 2017-05-08: qty 5

## 2017-05-08 MED ORDER — BUPIVACAINE LIPOSOME 1.3 % IJ SUSP
INTRAMUSCULAR | Status: DC | PRN
  Administered 2017-05-08: 20 mL

## 2017-05-08 MED ORDER — FENTANYL CITRATE (PF) 100 MCG/2ML IJ SOLN
INTRAMUSCULAR | Status: AC
  Filled 2017-05-08: qty 2

## 2017-05-08 MED ORDER — SUGAMMADEX SODIUM 500 MG/5ML IV SOLN
INTRAVENOUS | Status: AC
  Filled 2017-05-08: qty 5

## 2017-05-08 MED ORDER — MIDAZOLAM HCL 5 MG/5ML IJ SOLN
INTRAMUSCULAR | Status: DC | PRN
  Administered 2017-05-08: 2 mg via INTRAVENOUS

## 2017-05-08 MED ORDER — PHENYLEPHRINE 40 MCG/ML (10ML) SYRINGE FOR IV PUSH (FOR BLOOD PRESSURE SUPPORT)
PREFILLED_SYRINGE | INTRAVENOUS | Status: DC | PRN
  Administered 2017-05-08 (×3): 80 ug via INTRAVENOUS
  Administered 2017-05-08 (×2): 120 ug via INTRAVENOUS
  Administered 2017-05-08: 80 ug via INTRAVENOUS

## 2017-05-08 MED ORDER — FENTANYL CITRATE (PF) 100 MCG/2ML IJ SOLN
25.0000 ug | INTRAMUSCULAR | Status: DC | PRN
  Administered 2017-05-08 (×5): 50 ug via INTRAVENOUS

## 2017-05-08 MED ORDER — APREPITANT 40 MG PO CAPS
40.0000 mg | ORAL_CAPSULE | ORAL | Status: AC
  Administered 2017-05-08: 40 mg via ORAL
  Filled 2017-05-08: qty 1

## 2017-05-08 MED ORDER — PROMETHAZINE HCL 25 MG/ML IJ SOLN: 6.2500 mg | INTRAMUSCULAR | Status: DC | PRN

## 2017-05-08 MED ORDER — FENTANYL CITRATE (PF) 250 MCG/5ML IJ SOLN
INTRAMUSCULAR | Status: AC
  Filled 2017-05-08: qty 5

## 2017-05-08 MED ORDER — GABAPENTIN 250 MG/5ML PO SOLN
200.0000 mg | Freq: Two times a day (BID) | ORAL | Status: DC
  Administered 2017-05-08 – 2017-05-09 (×2): 200 mg via ORAL
  Filled 2017-05-08 (×2): qty 4

## 2017-05-08 MED ORDER — ONDANSETRON HCL 4 MG/2ML IJ SOLN
INTRAMUSCULAR | Status: DC | PRN
  Administered 2017-05-08: 4 mg via INTRAVENOUS

## 2017-05-08 MED ORDER — EPHEDRINE 5 MG/ML INJ
INTRAVENOUS | Status: AC
  Filled 2017-05-08: qty 10

## 2017-05-08 MED ORDER — BUPROPION HCL ER (SR) 150 MG PO TB12
150.0000 mg | ORAL_TABLET | Freq: Every day | ORAL | Status: DC
  Administered 2017-05-09: 150 mg via ORAL
  Filled 2017-05-08: qty 1

## 2017-05-08 MED ORDER — SUCCINYLCHOLINE CHLORIDE 200 MG/10ML IV SOSY
PREFILLED_SYRINGE | INTRAVENOUS | Status: DC | PRN
  Administered 2017-05-08: 180 mg via INTRAVENOUS

## 2017-05-08 MED ORDER — DEXAMETHASONE SODIUM PHOSPHATE 4 MG/ML IJ SOLN
4.0000 mg | INTRAMUSCULAR | Status: AC
  Administered 2017-05-08: 4 mg via INTRAVENOUS

## 2017-05-08 MED ORDER — SUGAMMADEX SODIUM 500 MG/5ML IV SOLN
INTRAVENOUS | Status: DC | PRN
  Administered 2017-05-08: 350 mg via INTRAVENOUS

## 2017-05-08 MED ORDER — ACETAMINOPHEN 500 MG PO TABS
1000.0000 mg | ORAL_TABLET | ORAL | Status: AC
  Administered 2017-05-08: 1000 mg via ORAL
  Filled 2017-05-08: qty 2

## 2017-05-08 MED ORDER — METOPROLOL TARTRATE 5 MG/5ML IV SOLN: 5.0000 mg | Freq: Four times a day (QID) | INTRAVENOUS | Status: DC | PRN

## 2017-05-08 MED ORDER — PROMETHAZINE HCL 25 MG/ML IJ SOLN
INTRAMUSCULAR | Status: AC
  Filled 2017-05-08: qty 1

## 2017-05-08 MED ORDER — OXYCODONE HCL 5 MG/5ML PO SOLN
5.0000 mg | ORAL | Status: DC | PRN
  Administered 2017-05-09: 10 mg via ORAL
  Filled 2017-05-08: qty 10

## 2017-05-08 MED ORDER — MIDAZOLAM HCL 2 MG/2ML IJ SOLN
INTRAMUSCULAR | Status: AC
  Filled 2017-05-08: qty 2

## 2017-05-08 MED ORDER — FENTANYL CITRATE (PF) 100 MCG/2ML IJ SOLN: 25.0000 ug | INTRAMUSCULAR | Status: DC | PRN

## 2017-05-08 MED ORDER — SCOPOLAMINE 1 MG/3DAYS TD PT72
1.0000 | MEDICATED_PATCH | TRANSDERMAL | Status: DC
  Administered 2017-05-08: 1.5 mg via TRANSDERMAL
  Filled 2017-05-08: qty 1

## 2017-05-08 MED ORDER — ENOXAPARIN SODIUM 40 MG/0.4ML ~~LOC~~ SOLN
40.0000 mg | SUBCUTANEOUS | Status: AC
  Administered 2017-05-08: 40 mg via SUBCUTANEOUS
  Filled 2017-05-08: qty 0.4

## 2017-05-08 MED ORDER — LIDOCAINE 2% (20 MG/ML) 5 ML SYRINGE
INTRAMUSCULAR | Status: DC | PRN
  Administered 2017-05-08: 1.5 mg/kg/h via INTRAVENOUS

## 2017-05-08 MED ORDER — SIMETHICONE 80 MG PO CHEW: 80.0000 mg | CHEWABLE_TABLET | Freq: Four times a day (QID) | ORAL | Status: DC | PRN

## 2017-05-08 MED ORDER — HYDROMORPHONE HCL 1 MG/ML IJ SOLN
0.5000 mg | INTRAMUSCULAR | Status: DC | PRN
  Administered 2017-05-08: 0.5 mg via INTRAVENOUS
  Filled 2017-05-08: qty 0.5

## 2017-05-08 MED ORDER — LACTATED RINGERS IR SOLN
Status: DC | PRN
  Administered 2017-05-08: 1000 mL

## 2017-05-08 MED ORDER — GABAPENTIN 300 MG PO CAPS
300.0000 mg | ORAL_CAPSULE | ORAL | Status: AC
  Administered 2017-05-08: 300 mg via ORAL
  Filled 2017-05-08: qty 1

## 2017-05-08 MED ORDER — ROCURONIUM BROMIDE 50 MG/5ML IV SOSY
PREFILLED_SYRINGE | INTRAVENOUS | Status: DC | PRN
  Administered 2017-05-08: 10 mg via INTRAVENOUS
  Administered 2017-05-08: 50 mg via INTRAVENOUS
  Administered 2017-05-08 (×2): 10 mg via INTRAVENOUS

## 2017-05-08 MED ORDER — KETAMINE HCL 10 MG/ML IJ SOLN
INTRAMUSCULAR | Status: AC
  Filled 2017-05-08: qty 1

## 2017-05-08 MED ORDER — ALBUTEROL SULFATE (2.5 MG/3ML) 0.083% IN NEBU: 3.0000 mL | INHALATION_SOLUTION | Freq: Four times a day (QID) | RESPIRATORY_TRACT | Status: DC | PRN

## 2017-05-08 MED ORDER — LIDOCAINE HCL 2 % IJ SOLN
INTRAMUSCULAR | Status: AC
  Filled 2017-05-08: qty 20

## 2017-05-08 MED ORDER — EPHEDRINE SULFATE-NACL 50-0.9 MG/10ML-% IV SOSY
PREFILLED_SYRINGE | INTRAVENOUS | Status: DC | PRN
  Administered 2017-05-08: 10 mg via INTRAVENOUS

## 2017-05-08 MED ORDER — PROPOFOL 10 MG/ML IV BOLUS
INTRAVENOUS | Status: AC
  Filled 2017-05-08: qty 20

## 2017-05-08 MED ORDER — HYDRALAZINE HCL 20 MG/ML IJ SOLN: 10.0000 mg | INTRAMUSCULAR | Status: DC | PRN

## 2017-05-08 MED ORDER — CEFOTETAN DISODIUM-DEXTROSE 2-2.08 GM-%(50ML) IV SOLR
2.0000 g | INTRAVENOUS | Status: AC
  Administered 2017-05-08: 2 g via INTRAVENOUS
  Filled 2017-05-08: qty 50

## 2017-05-08 MED ORDER — CELECOXIB 200 MG PO CAPS
400.0000 mg | ORAL_CAPSULE | ORAL | Status: AC
  Administered 2017-05-08: 400 mg via ORAL
  Filled 2017-05-08: qty 2

## 2017-05-08 MED ORDER — LACTATED RINGERS IV SOLN: INTRAVENOUS | Status: DC

## 2017-05-08 MED ORDER — LIDOCAINE 2% (20 MG/ML) 5 ML SYRINGE
INTRAMUSCULAR | Status: DC | PRN
  Administered 2017-05-08: 100 mg via INTRAVENOUS

## 2017-05-08 MED ORDER — FENTANYL CITRATE (PF) 100 MCG/2ML IJ SOLN
INTRAMUSCULAR | Status: DC | PRN
  Administered 2017-05-08 (×2): 50 ug via INTRAVENOUS
  Administered 2017-05-08: 150 ug via INTRAVENOUS

## 2017-05-08 MED ORDER — KETAMINE HCL 10 MG/ML IJ SOLN
INTRAMUSCULAR | Status: DC | PRN
  Administered 2017-05-08: 50 mg via INTRAVENOUS

## 2017-05-08 MED ORDER — PHENYLEPHRINE 40 MCG/ML (10ML) SYRINGE FOR IV PUSH (FOR BLOOD PRESSURE SUPPORT)
PREFILLED_SYRINGE | INTRAVENOUS | Status: AC
  Filled 2017-05-08: qty 10

## 2017-05-08 MED ORDER — BUPIVACAINE-EPINEPHRINE 0.25% -1:200000 IJ SOLN
INTRAMUSCULAR | Status: DC | PRN
  Administered 2017-05-08: 50 mL

## 2017-05-08 MED ORDER — BUPIVACAINE LIPOSOME 1.3 % IJ SUSP
20.0000 mL | Freq: Once | INTRAMUSCULAR | Status: DC
  Filled 2017-05-08: qty 20

## 2017-05-08 MED ORDER — PROPOFOL 10 MG/ML IV BOLUS
INTRAVENOUS | Status: DC | PRN
  Administered 2017-05-08: 200 mg via INTRAVENOUS

## 2017-05-08 MED ORDER — ONDANSETRON HCL 4 MG/2ML IJ SOLN: 4.0000 mg | INTRAMUSCULAR | Status: DC | PRN

## 2017-05-08 MED ORDER — SUCCINYLCHOLINE CHLORIDE 200 MG/10ML IV SOSY
PREFILLED_SYRINGE | INTRAVENOUS | Status: AC
  Filled 2017-05-08: qty 10

## 2017-05-08 MED ORDER — ACETAMINOPHEN 160 MG/5ML PO SOLN: 650.0000 mg | ORAL | Status: DC | PRN

## 2017-05-08 MED ORDER — DEXAMETHASONE SODIUM PHOSPHATE 10 MG/ML IJ SOLN: INTRAMUSCULAR | Status: DC | PRN

## 2017-05-08 MED ORDER — PANTOPRAZOLE SODIUM 40 MG IV SOLR
40.0000 mg | Freq: Every day | INTRAVENOUS | Status: DC
  Administered 2017-05-08: 40 mg via INTRAVENOUS
  Filled 2017-05-08: qty 40

## 2017-05-08 SURGICAL SUPPLY — 73 items
APL SKNCLS STERI-STRIP NONHPOA (GAUZE/BANDAGES/DRESSINGS) ×2
APPLICATOR COTTON TIP 6IN STRL (MISCELLANEOUS) IMPLANT
APPLIER CLIP ROT 10 11.4 M/L (STAPLE)
APPLIER CLIP ROT 13.4 12 LRG (CLIP)
APR CLP LRG 13.4X12 ROT 20 MLT (CLIP)
APR CLP MED LRG 11.4X10 (STAPLE)
BAG LAPAROSCOPIC 12 15 PORT 16 (BASKET) IMPLANT
BAG RETRIEVAL 12/15 (BASKET) ×3
BAG RETRIEVAL 12/15MM (BASKET) ×1
BANDAGE ADH SHEER 1  50/CT (GAUZE/BANDAGES/DRESSINGS) ×24 IMPLANT
BENZOIN TINCTURE PRP APPL 2/3 (GAUZE/BANDAGES/DRESSINGS) ×4 IMPLANT
BLADE SURG SZ11 CARB STEEL (BLADE) ×4 IMPLANT
CABLE HIGH FREQUENCY MONO STRZ (ELECTRODE) ×4 IMPLANT
CHLORAPREP W/TINT 26ML (MISCELLANEOUS) ×8 IMPLANT
CLIP APPLIE ROT 10 11.4 M/L (STAPLE) IMPLANT
CLIP APPLIE ROT 13.4 12 LRG (CLIP) IMPLANT
CLOSURE WOUND 1/2 X4 (GAUZE/BANDAGES/DRESSINGS) ×1
COVER SURGICAL LIGHT HANDLE (MISCELLANEOUS) ×4 IMPLANT
DECANTER SPIKE VIAL GLASS SM (MISCELLANEOUS) ×4 IMPLANT
DEVICE SUT QUICK LOAD TK 5 (STAPLE) IMPLANT
DEVICE SUT TI-KNOT TK 5X26 (MISCELLANEOUS) IMPLANT
DEVICE TI KNOT TK5 (MISCELLANEOUS)
DRAPE UTILITY XL STRL (DRAPES) ×8 IMPLANT
ELECT REM PT RETURN 15FT ADLT (MISCELLANEOUS) ×4 IMPLANT
GAUZE SPONGE 4X4 12PLY STRL (GAUZE/BANDAGES/DRESSINGS) IMPLANT
GLOVE BIO SURGEON STRL SZ 6 (GLOVE) ×4 IMPLANT
GLOVE INDICATOR 6.5 STRL GRN (GLOVE) ×4 IMPLANT
GOWN STRL REUS W/TWL LRG LVL3 (GOWN DISPOSABLE) ×2 IMPLANT
GOWN STRL REUS W/TWL XL LVL3 (GOWN DISPOSABLE) ×12 IMPLANT
GRASPER SUT TROCAR 14GX15 (MISCELLANEOUS) ×4 IMPLANT
HOVERMATT SINGLE USE (MISCELLANEOUS) ×4 IMPLANT
KIT BASIN OR (CUSTOM PROCEDURE TRAY) ×4 IMPLANT
MARKER SKIN DUAL TIP RULER LAB (MISCELLANEOUS) ×4 IMPLANT
NDL SPNL 22GX3.5 QUINCKE BK (NEEDLE) ×2 IMPLANT
NEEDLE SPNL 22GX3.5 QUINCKE BK (NEEDLE) ×4 IMPLANT
PACK UNIVERSAL I (CUSTOM PROCEDURE TRAY) ×4 IMPLANT
QUICK LOAD TK 5 (STAPLE)
RELOAD ENDO STITCH (ENDOMECHANICALS) IMPLANT
RELOAD STAPLE 60 3.6 BLU REG (STAPLE) ×2 IMPLANT
RELOAD STAPLE 60 3.8 GOLD REG (STAPLE) ×2 IMPLANT
RELOAD STAPLE 60 4.1 GRN THCK (STAPLE) IMPLANT
RELOAD STAPLER BLUE 60MM (STAPLE) ×6 IMPLANT
RELOAD STAPLER GOLD 60MM (STAPLE) ×4 IMPLANT
RELOAD STAPLER GREEN 60MM (STAPLE) ×4 IMPLANT
RELOAD SUT TRIPLE-STITCH 2-0 (ENDOMECHANICALS) IMPLANT
SCISSORS LAP 5X45 EPIX DISP (ENDOMECHANICALS) ×4 IMPLANT
SET IRRIG TUBING LAPAROSCOPIC (IRRIGATION / IRRIGATOR) ×4 IMPLANT
SHEARS HARMONIC ACE PLUS 45CM (MISCELLANEOUS) ×4 IMPLANT
SLEEVE ADV FIXATION 5X100MM (TROCAR) ×8 IMPLANT
SLEEVE GASTRECTOMY 40FR VISIGI (MISCELLANEOUS) ×4 IMPLANT
SOLUTION ANTI FOG 6CC (MISCELLANEOUS) ×4 IMPLANT
SPONGE LAP 18X18 X RAY DECT (DISPOSABLE) ×4 IMPLANT
STAPLER ECHELON BIOABSB 60 FLE (MISCELLANEOUS) ×20 IMPLANT
STAPLER ECHELON LONG 60 440 (INSTRUMENTS) ×4 IMPLANT
STAPLER RELOAD BLUE 60MM (STAPLE) ×12
STAPLER RELOAD GOLD 60MM (STAPLE) ×8
STAPLER RELOAD GREEN 60MM (STAPLE) ×8
STRIP CLOSURE SKIN 1/2X4 (GAUZE/BANDAGES/DRESSINGS) ×3 IMPLANT
SUT MNCRL AB 4-0 PS2 18 (SUTURE) ×4 IMPLANT
SUT SURGIDAC NAB ES-9 0 48 120 (SUTURE) IMPLANT
SUT VICRYL 0 TIES 12 18 (SUTURE) ×4 IMPLANT
SYR 10ML ECCENTRIC (SYRINGE) ×4 IMPLANT
SYR 20CC LL (SYRINGE) ×4 IMPLANT
SYR 50ML LL SCALE MARK (SYRINGE) ×4 IMPLANT
TOWEL OR 17X26 10 PK STRL BLUE (TOWEL DISPOSABLE) ×4 IMPLANT
TOWEL OR NON WOVEN STRL DISP B (DISPOSABLE) ×4 IMPLANT
TROCAR ADV FIXATION 5X100MM (TROCAR) ×4 IMPLANT
TROCAR BLADELESS 15MM (ENDOMECHANICALS) ×4 IMPLANT
TROCAR BLADELESS OPT 5 100 (ENDOMECHANICALS) ×4 IMPLANT
TUBING CONNECTING 10 (TUBING) ×4 IMPLANT
TUBING CONNECTING 10' (TUBING) ×2
TUBING ENDO SMARTCAP (MISCELLANEOUS) ×4 IMPLANT
TUBING INSUF HEATED (TUBING) ×4 IMPLANT

## 2017-05-08 NOTE — Anesthesia Preprocedure Evaluation (Signed)
Anesthesia Evaluation  Patient identified by MRN, date of birth, ID band Patient awake    Reviewed: Allergy & Precautions, NPO status , Patient's Chart, lab work & pertinent test results  Airway Mallampati: III  TM Distance: >3 FB Neck ROM: Full    Dental  (+) Teeth Intact, Dental Advisory Given   Pulmonary sleep apnea and Continuous Positive Airway Pressure Ventilation ,    Pulmonary exam normal breath sounds clear to auscultation       Cardiovascular hypertension, Pt. on medications Normal cardiovascular exam+ dysrhythmias (Sinus Tach)  Rhythm:Regular Rate:Normal     Neuro/Psych  Headaches, PSYCHIATRIC DISORDERS Anxiety Pseudotumor cerebri    GI/Hepatic negative GI ROS, Neg liver ROS,   Endo/Other  diabetes, Type 2, Oral Hypoglycemic Agents, Insulin DependentMorbid obesity  Renal/GU negative Renal ROS     Musculoskeletal  (+) Arthritis ,   Abdominal   Peds  Hematology negative hematology ROS (+)   Anesthesia Other Findings Day of surgery medications reviewed with the patient.  Reproductive/Obstetrics negative OB ROS                             Anesthesia Physical Anesthesia Plan  ASA: IV  Anesthesia Plan: General   Post-op Pain Management:    Induction: Intravenous  PONV Risk Score and Plan: 4 or greater and Scopolamine patch - Pre-op, Midazolam, Dexamethasone and Ondansetron  Airway Management Planned: Oral ETT  Additional Equipment:   Intra-op Plan:   Post-operative Plan: Extubation in OR  Informed Consent: I have reviewed the patients History and Physical, chart, labs and discussed the procedure including the risks, benefits and alternatives for the proposed anesthesia with the patient or authorized representative who has indicated his/her understanding and acceptance.   Dental advisory given  Plan Discussed with: CRNA  Anesthesia Plan Comments: (Risks/benefits of  general anesthesia discussed with patient including risk of damage to teeth, lips, gum, and tongue, nausea/vomiting, allergic reactions to medications, and the possibility of heart attack, stroke and death.  All patient questions answered.  Patient wishes to proceed.)        Anesthesia Quick Evaluation

## 2017-05-08 NOTE — Anesthesia Procedure Notes (Signed)
Procedure Name: Intubation Date/Time: 05/08/2017 11:09 AM Performed by: Epimenio SarinJarvela, Ommie Degeorge R, CRNA Pre-anesthesia Checklist: Patient identified, Emergency Drugs available, Suction available, Patient being monitored and Timeout performed Patient Re-evaluated:Patient Re-evaluated prior to induction Oxygen Delivery Method: Circle system utilized Preoxygenation: Pre-oxygenation with 100% oxygen Induction Type: IV induction Ventilation: Mask ventilation without difficulty and Oral airway inserted - appropriate to patient size Laryngoscope Size: Hyacinth MeekerMiller and 2 Grade View: Grade II Tube type: Oral Tube size: 7.0 mm Number of attempts: 1 Airway Equipment and Method: Stylet Placement Confirmation: ETT inserted through vocal cords under direct vision,  positive ETCO2 and breath sounds checked- equal and bilateral Secured at: 22 cm Tube secured with: Tape Dental Injury: Teeth and Oropharynx as per pre-operative assessment

## 2017-05-08 NOTE — Progress Notes (Signed)
Pt has declined use of CPAP QHS.  RT to monitor and assess as needed.  

## 2017-05-08 NOTE — Op Note (Signed)
Operative Note  Cassandra BloodgoodCherokee M Hickman  [id number]  [id number]  05/08/2017   Surgeon: Lady Deutscherhelsea A ConnorMD  Assistant: Jaclynn GuarneriBen Hoxworth MD  Procedure performed: laparoscopic sleeve gastrectomy, upper endoscopy  Preop diagnosis: Morbid obesity Body mass index is 62.04 kg/m., obstructive sleep apnea, intracranial hypertension, diabetes, arthritis Post-op diagnosis/intraop findings: same  Specimens: fundus Retained items: none EBL: 30cc Complications: none  Description of procedure: After obtaining informed consent and administration of prophylactic lovenox in holding, the patient was taken to the operating room and placed supine on operating room table wheregeneral endotracheal anesthesia was initiated, preoperative antibiotics were administered, SCDs applied, and a formal timeout was performed. The abdomen was prepped and draped in usual sterile fashion. Peritoneal access was gained using a Visiport technique in the left upper quadrant and insufflation to 15 mmHg ensued without issue. Gross inspection revealed no evidence of injury. Her liver was quite enlarged and boggy. Under direct visualization three more 5 mm trochars were placed in the right and left hemiabdomen and the 15mm trocar in the right paramedian upper abdomen. Bilateral laparoscopic assisted TAPS blocks were performed with Exparel diluted with 0.25 percent Marcaine with epinephrine. The patient was placed in steep Trendelenburg and the liver retractor was introduced through an incision in the upper midline and secured to the post externally to maintain the left lobe retracted anteriorly. Using the Harmonic scalpel, the greater curvature of the stomach was dissected away from the greater omentum and short gastric vessels were divided. This began 6 cm from the pylorus, and dissection proceeded until the left crus was clearly exposed. There were some filmy adhesions of the posterior stomach to the pancreas which were taken down the  Harmonic. Esophageal fat pad was mobilized off the anterior stomach slightly. The 59[address] [place] VisiGi was then introduced and directed down towards the pylorus. This was placed to suction against the lesser curve. Serial fires of the linear cutting stapler with Peri-Strips were then employed to create our sleeve. The first 2 fires used to green load and ensured adequate room at the angularis incisura. Twos gold loads and then 2 blue loads were then employed to create a narrow tubular stomach all up to the angle of His. The excised stomach was then removed through our 15 mm trocar site within an Endo Catch bag. The visigi was taken off of suction and a few puffs of air were introduced, inflating the sleeve. No bubbles were observed and the irrigation fluid around the stomach and the shape was noted to be a nice smooth tube without any narrowing at the angularis. The visigi was then removed. Upper endoscopy was performed by the assistant surgeon and the sleeve was noted to be airtight, the staple line was hemostatic. Please see his separate note. The endoscope was removed. The 15 mm trocar site fascia in the right upper abdomen was closed with a 0 Vicryl using the laparoscopic suture passer under direct visualization. The liver retractor was removed under direct visualization. The abdomen was then desufflated and all remaining trochars removed. The skin incisions were closed with running subcuticular Monocryl; benzoin, Steri-Strips and Band-Aids were applied The patient was then awakened, extubated and taken to PACU in stable condition.    All counts were correct at the completion of the case.

## 2017-05-08 NOTE — Transfer of Care (Signed)
Immediate Anesthesia Transfer of Care Note  Patient: Cassandra Tucker  Procedure(s) Performed: LAPAROSCOPIC GASTRIC SLEEVE RESECTION (N/A Abdomen) UPPER GI ENDOSCOPY  Patient Location: PACU  Anesthesia Type:General  Level of Consciousness: drowsy and patient cooperative  Airway & Oxygen Therapy: Patient Spontanous Breathing and Patient connected to face mask  Post-op Assessment: Report given to RN and Post -op Vital signs reviewed and stable  Post vital signs: Reviewed and stable  Last Vitals:  Vitals:   05/08/17 0920 05/08/17 1315  BP: (!) 152/91 (P) 115/74  Pulse: (!) 108 (!) (P) 123  Resp: 20 (P) 15  Temp: 36.9 C (P) 36.9 C  SpO2: 100% (P) 97%    Last Pain:  Vitals:   05/08/17 0930  TempSrc: Oral      Patients Stated Pain Goal: 3 (05/08/17 0948)  Complications: No apparent anesthesia complications

## 2017-05-08 NOTE — Interval H&P Note (Signed)
History and Physical Interval Note:  05/08/2017 10:05 AM  Cassandra Tucker  has presented today for surgery, with the diagnosis of MORBID OBESITY  The various methods of treatment have been discussed with the patient and family. After consideration of risks, benefits and other options for treatment, the patient has consented to  Procedure(s): LAPAROSCOPIC GASTRIC SLEEVE RESECTION (N/A) as a surgical intervention .  The patient's history has been reviewed, patient examined, no change in status, stable for surgery.  I have reviewed the patient's chart and labs.  Questions were answered to the patient's satisfaction.     Dann Ventress Lollie SailsA Nessie Nong

## 2017-05-08 NOTE — Op Note (Signed)
Procedure: Upper GI endoscopy  Description of procedure: Upper GI endoscopy is performed at the completion of laparoscopic sleeve gastrectomy by Dr.  Fredricka Bonineonnor  The video endoscope was introduced into the upper esophagus and then passed to the EG junction at about 40 cm. The esophagus appeared normal. The gastric sleeve was entered. The sleeve was tensely distended with air while the outlet was obstructed under saline irrigation by the operating surgeon. There was no evidence of leak. The staple line was intact and without bleeding. The scope was advanced to the antrum and pylorus visualized. There was no stricture or twisting or mucosal abnormality, and particularly no narrowing noted at the incisura.  The pouch was then desufflated and the scope withdrawn.  Cassandra SaaBenjamin T Donna Snooks MD, FACS  05/08/2017, 12:48 PM

## 2017-05-09 ENCOUNTER — Encounter (HOSPITAL_COMMUNITY): Payer: Self-pay | Admitting: Surgery

## 2017-05-09 LAB — CBC WITH DIFFERENTIAL/PLATELET
BASOS PCT: 0 %
Basophils Absolute: 0 10*3/uL (ref 0.0–0.1)
EOS ABS: 0 10*3/uL (ref 0.0–0.7)
Eosinophils Relative: 0 %
HEMATOCRIT: 36.7 % (ref 36.0–46.0)
HEMOGLOBIN: 11.3 g/dL — AB (ref 12.0–15.0)
LYMPHS ABS: 2 10*3/uL (ref 0.7–4.0)
Lymphocytes Relative: 18 %
MCH: 25.2 pg — AB (ref 26.0–34.0)
MCHC: 30.8 g/dL (ref 30.0–36.0)
MCV: 81.9 fL (ref 78.0–100.0)
MONO ABS: 0.6 10*3/uL (ref 0.1–1.0)
MONOS PCT: 6 %
Neutro Abs: 8.5 10*3/uL — ABNORMAL HIGH (ref 1.7–7.7)
Neutrophils Relative %: 76 %
Platelets: 240 10*3/uL (ref 150–400)
RBC: 4.48 MIL/uL (ref 3.87–5.11)
RDW: 15.1 % (ref 11.5–15.5)
WBC: 11.2 10*3/uL — ABNORMAL HIGH (ref 4.0–10.5)

## 2017-05-09 LAB — COMPREHENSIVE METABOLIC PANEL
ALK PHOS: 49 U/L (ref 38–126)
ALT: 21 U/L (ref 14–54)
ANION GAP: 5 (ref 5–15)
AST: 18 U/L (ref 15–41)
Albumin: 3.4 g/dL — ABNORMAL LOW (ref 3.5–5.0)
BILIRUBIN TOTAL: 0.5 mg/dL (ref 0.3–1.2)
BUN: 11 mg/dL (ref 6–20)
CALCIUM: 8.8 mg/dL — AB (ref 8.9–10.3)
CO2: 27 mmol/L (ref 22–32)
Chloride: 107 mmol/L (ref 101–111)
Creatinine, Ser: 0.59 mg/dL (ref 0.44–1.00)
Glucose, Bld: 119 mg/dL — ABNORMAL HIGH (ref 65–99)
POTASSIUM: 4 mmol/L (ref 3.5–5.1)
Sodium: 139 mmol/L (ref 135–145)
TOTAL PROTEIN: 6.4 g/dL — AB (ref 6.5–8.1)

## 2017-05-09 LAB — GLUCOSE, CAPILLARY
GLUCOSE-CAPILLARY: 112 mg/dL — AB (ref 65–99)
Glucose-Capillary: 96 mg/dL (ref 65–99)

## 2017-05-09 MED ORDER — INSULIN ASPART 100 UNIT/ML ~~LOC~~ SOLN: 0.0000 [IU] | SUBCUTANEOUS | Status: DC

## 2017-05-09 NOTE — Discharge Summary (Signed)
Physician Discharge Summary  Cassandra Tucker:[id number] DOB: Nov 29, 1990 DOA: 05/08/2017  PCP: Chevis Pretty, FNP  Admit date: 05/08/2017 Discharge date: 05/09/2017  Recommendations for Outpatient Follow-up:  1. See PCP within 1 week to titrate medications for blood pressure and diabetes (include homehealth, outpatient follow-up instructions, specific recommendations for PCP to follow-up on, etc.)  Follow-up Information    Clovis Riley, MD. Go on 05/23/2017.   Specialty:  General Surgery Why:  at [address] Contact information: [phone]        Clovis Riley, MD Follow up.   Specialty:  General Surgery Contact information: [phone]          Discharge Diagnoses:  Active Problems:   * No active hospital problems. *   Surgical Procedure: Laparoscopic Sleeve Gastrectomy, upper endoscopy  Discharge Condition: Good Disposition: Home  Diet recommendation: Postoperative sleeve gastrectomy diet (liquids only)  Filed Weights   05/08/17 0920  Weight: (!) 174.4 kg (384 lb 6 oz)     Hospital Course:  The patient was admitted for a planned laparoscopic sleeve gastrectomy. Please see operative note. Preoperatively the patient was given lovenox for DVT prophylaxis. Postoperative prophylactic Lovenox dosing was started on the evening of postoperative day 0. ERAS protocol was used. On the evening of postoperative day 0, the patient was started on water and ice chips. On postoperative day 1 the patient had no fever or tachycardia (she has baseline mild sinus tachycardia, HR remained in 90-110 range) and was tolerating water in their diet was gradually advanced throughout the day. The patient was ambulating without difficulty. Their vital signs are stable without fever or tachycardia. Their hemoglobin had remained stable.  The patient had received discharge instructions and counseling. They were deemed stable for discharge and had met discharge  criteria   Discharge Instructions  Discharge Instructions    Ambulate hourly while awake   Complete by:  As directed    Call MD for:  difficulty breathing, headache or visual disturbances   Complete by:  As directed    Call MD for:  persistant dizziness or light-headedness   Complete by:  As directed    Call MD for:  persistant nausea and vomiting   Complete by:  As directed    Call MD for:  redness, tenderness, or signs of infection (pain, swelling, redness, odor or green/yellow discharge around incision site)   Complete by:  As directed    Call MD for:  severe uncontrolled pain   Complete by:  As directed    Call MD for:  temperature >101 F   Complete by:  As directed    Incentive spirometry   Complete by:  As directed    Perform hourly while awake     Allergies as of 05/09/2017   No Known Allergies     Medication List    STOP taking these medications   acetaZOLAMIDE 500 MG capsule Commonly known as:  DIAMOX   hydrochlorothiazide 12.5 MG tablet Commonly known as:  HYDRODIURIL   lisinopril 20 MG tablet Commonly known as:  PRINIVIL,ZESTRIL     TAKE these medications   albuterol 108 (90 Base) MCG/ACT inhaler Commonly known as:  PROVENTIL HFA;VENTOLIN HFA Inhale 2 puffs into the lungs every 6 (six) hours as needed for wheezing or shortness of breath.   buPROPion 150 MG 12 hr tablet Commonly known as:  WELLBUTRIN SR Take 1 tablet (150 mg total) daily with breakfast by mouth.   omeprazole 40 MG capsule Commonly known as:  PRILOSEC TAKE 1 CAPSULE (40 MG TOTAL) BY MOUTH DAILY.   Pen Needles 32G X 4 MM Misc 1 each by Does not apply route daily.   VICTOZA 18 MG/3ML Sopn Generic drug:  liraglutide INJECT 0.2 MLS (1.2 MG TOTAL) INTO THE SKIN DAILY.   Vitamin D (Ergocalciferol) 50000 units Caps capsule Commonly known as:  DRISDOL Take 1 capsule (50,000 Units total) every 7 (seven) days by mouth. What changed:  when to take this      Follow-up Information     Clovis Riley, MD. Go on 05/23/2017.   Specialty:  General Surgery Why:  at [address] Contact information: [phone]        Clovis Riley, MD Follow up.   Specialty:  General Surgery Contact information: [phone]            The results of significant diagnostics from this hospitalization (including imaging, microbiology, ancillary and laboratory) are listed below for reference.    Significant Diagnostic Studies: No results found.  Labs: Basic Metabolic Panel: Recent Labs  Lab 05/09/17 0530  NA 139  K 4.0  CL 107  CO2 27  GLUCOSE 119*  BUN 11  CREATININE 0.59  CALCIUM 8.8*   Liver Function Tests: Recent Labs  Lab 05/09/17 0530  AST 18  ALT 21  ALKPHOS 49  BILITOT 0.5  PROT 6.4*  ALBUMIN 3.4*    CBC: Recent Labs  Lab 05/09/17 0530  WBC 11.2*  NEUTROABS 8.5*  HGB 11.3*  HCT 36.7  MCV 81.9  PLT 240    CBG: Recent Labs  Lab 05/08/17 0951 05/09/17 0834  GLUCAP 113* 112*    Active Problems:   * No active hospital problems. *     Signed:  [facility], [place] [phone] 05/09/2017, 11:11 AM

## 2017-05-09 NOTE — Progress Notes (Signed)
S: Slept well. Pain well controlled. No nausea or reflux sx. Working on protein shakes this AM.   Vitals, labs, intake/output, and orders reviewed at this time. Afebrile. HR at baseline, 90-110 overnight. She has sinus tachycardia preop. Normo- to hypertensive. Sats high 90s on room air. CMP unremarkable. WBC 11.2 (8.3), hgb 11.3 (12.0). UOP 1400, PO 240 today so far.   Gen: A&Ox3, no distress  H&N: EOMI, atraumatic, neck supple Chest: unlabored respirations, RRR Abd: soft, appropriately tender, nondistended, incisions c/d/i  With steris.  Ext: warm, no edema Neuro: grossly normal  Lines/tubes/drains: PIV  A/P:  POD 1 sleeve gastrectomy, doing well -Ambulate -Pulm toilet -Continue clears/ protein shake as tolerated -Discharge today   Phylliss Blakeshelsea Amany Rando, MD [facility] [place] Pager [phone]

## 2017-05-09 NOTE — Progress Notes (Signed)
Patient alert and oriented, Post op day 1.  Provided support and encouragement.  Encouraged pulmonary toilet, ambulation and small sips of liquids. Working on 6th cup of clear liquids.  Discussed starting protein once fluid goals are met.   All questions answered.  Will continue to monitor.

## 2017-05-09 NOTE — Discharge Instructions (Signed)

## 2017-05-09 NOTE — Progress Notes (Signed)
Patient alert and oriented, pain is controlled. Patient is tolerating fluids, advanced to protein shake today, patient is tolerating well.  Reviewed Gastric sleeve discharge instructions with patient and patient is able to articulate understanding.  Provided information on BELT program, Support Group and [facility] outpatient pharmacy. All questions answered, will continue to monitor.  

## 2017-05-09 NOTE — Plan of Care (Signed)

## 2017-05-09 NOTE — Progress Notes (Signed)
Pt tolerating diet, ambulating and pain controlled.  D/C instructions were given, all questions answered. Pt was d/cd home.

## 2017-05-09 NOTE — Anesthesia Postprocedure Evaluation (Signed)
Anesthesia Post Note  Patient: Cassandra Tucker  Procedure(s) Performed: LAPAROSCOPIC GASTRIC SLEEVE RESECTION (N/A Abdomen) UPPER GI ENDOSCOPY     Patient location during evaluation: PACU Anesthesia Type: General Level of consciousness: awake and alert Pain management: pain level controlled Vital Signs Assessment: post-procedure vital signs reviewed and stable Respiratory status: spontaneous breathing, nonlabored ventilation, respiratory function stable and patient connected to nasal cannula oxygen Cardiovascular status: blood pressure returned to baseline and stable Postop Assessment: no apparent nausea or vomiting Anesthetic complications: no    Last Vitals:  Vitals:   05/09/17 0030 05/09/17 0547  BP: 140/85 127/86  Pulse: 94 90  Resp: 18 18  Temp: 36.6 C 36.5 C  SpO2: 98% 96%    Last Pain:  Vitals:   05/09/17 0547  TempSrc: Oral  PainSc:                  Cecile HearingStephen Edward Turk

## 2017-05-14 ENCOUNTER — Telehealth (HOSPITAL_COMMUNITY): Payer: Self-pay

## 2017-05-14 NOTE — Telephone Encounter (Signed)
Follow up with bariatric surgical patient to discuss post discharge questions.    1.  Are you having any pain not relieved by pain medication?have not needed  2.  How much fluid total fluid intake have you had in the last 24/48 hours?  57 ounces  3.  How much protein intake have you had in the last 24/48 hours?45 grams  4.  Have you had any trouble making urine?no  5.  Have you had nausea that has not been relieved by nausea medication?no  6.  Are you ambulating every hour?yes  7.  Are you passing gas or had a BM?yes  8.  Do you know how to contact [facility]? [facility]? [facility]?yes  9.  Are you taking your vitamins and calcium without difficulty?yes  10. Tell me how your incision looks?  Any redness, open incision, or drainage?they look good

## 2017-05-21 ENCOUNTER — Other Ambulatory Visit (INDEPENDENT_AMBULATORY_CARE_PROVIDER_SITE_OTHER): Payer: Self-pay | Admitting: Family Medicine

## 2017-05-21 DIAGNOSIS — E559 Vitamin D deficiency, unspecified: Secondary | ICD-10-CM

## 2017-05-22 ENCOUNTER — Encounter: Payer: BLUE CROSS/BLUE SHIELD | Attending: Surgery | Admitting: Skilled Nursing Facility1

## 2017-05-22 DIAGNOSIS — Z713 Dietary counseling and surveillance: Secondary | ICD-10-CM | POA: Insufficient documentation

## 2017-05-22 DIAGNOSIS — Z6841 Body Mass Index (BMI) 40.0 and over, adult: Secondary | ICD-10-CM | POA: Diagnosis not present

## 2017-05-22 DIAGNOSIS — E119 Type 2 diabetes mellitus without complications: Secondary | ICD-10-CM

## 2017-05-23 ENCOUNTER — Encounter: Payer: Self-pay | Admitting: Skilled Nursing Facility1

## 2017-05-23 NOTE — Progress Notes (Signed)
Bariatric Class:  Appt start time: 1530 end time:  1630.  2 Week Post-Operative Nutrition Class  Patient was seen on 05/22/2017 for Post-Operative Nutrition [facility]   Pt states she checks her blood sugar 2-3 times a week with numbers being 110-115.  Surgery date: 05/08/2017 Surgery type: Sleeve  [facility] weight at [facility]: 387.6 Weight today: 359.4  TANITA  BODY COMP RESULTS  05/22/2017   BMI (kg/m^2) 58   Fat Mass (lbs) 192.2   Fat Free Mass (lbs) 167.2   Total Body Water (lbs) 126.8   The following the learning objectives were met by the patient during this course:  Identifies Phase 3A (Soft, High Proteins) Dietary Goals and will begin from 2 weeks post-operatively to 2 months post-operatively  Identifies appropriate sources of fluids and proteins   States protein recommendations and appropriate sources post-operatively  Identifies the need for appropriate texture modifications, mastication, and bite sizes when consuming solids  Identifies appropriate multivitamin and calcium sources post-operatively  Describes the need for physical activity post-operatively and will follow MD recommendations  States when to call healthcare provider regarding medication questions or post-operative complications  Handouts given during class include:  Phase 3A: Soft, High Protein Diet Handout  Follow-Up Plan: Patient will follow-up at [facility] in 6 weeks for 2 month post-op nutrition visit for diet advancement per MD.

## 2017-05-29 ENCOUNTER — Telehealth: Payer: Self-pay | Admitting: Nurse Practitioner

## 2017-05-29 NOTE — Telephone Encounter (Signed)
Left message for patient to drop off forms for review

## 2017-06-10 ENCOUNTER — Other Ambulatory Visit (INDEPENDENT_AMBULATORY_CARE_PROVIDER_SITE_OTHER): Payer: Self-pay | Admitting: Family Medicine

## 2017-07-06 ENCOUNTER — Encounter: Payer: BLUE CROSS/BLUE SHIELD | Attending: Surgery | Admitting: Registered"

## 2017-07-06 ENCOUNTER — Encounter: Payer: Self-pay | Admitting: Registered"

## 2017-07-06 DIAGNOSIS — Z6841 Body Mass Index (BMI) 40.0 and over, adult: Secondary | ICD-10-CM | POA: Insufficient documentation

## 2017-07-06 DIAGNOSIS — Z713 Dietary counseling and surveillance: Secondary | ICD-10-CM | POA: Insufficient documentation

## 2017-07-06 DIAGNOSIS — E669 Obesity, unspecified: Secondary | ICD-10-CM

## 2017-07-06 NOTE — Progress Notes (Signed)
Follow-up visit:  8 Weeks Post-Operative Sleeve gastrectomy Surgery  Medical Nutrition Therapy:  Appt start time: 9:02 end time:  9:43.  Primary concerns today: Post-operative Bariatric Surgery Nutrition Management.  Non scale victories: able to wear boots now, increased desire to go places and do things  Surgery date: 05/08/2017 Surgery type: Sleeve gastrectomy [facility]: 387.6 lbs Weight today: 330.8 lbs Weight change: 28.5 lbs from 359.4 (05/22/2017) Total weight lost: 56.8 lbs Weight loss goal: to be healthy, able to ride go carts with brother, able to ride horses, to feel less winded when walking, able to wear boots   TANITA  BODY COMP RESULTS  05/22/2017 07/06/2017   BMI (kg/m^2) 58 53.4   Fat Mass (lbs) 192.2 170.6   Fat Free Mass (lbs) 167.2 160.2   Total Body Water (lbs) 126.8 120.8    Pt states she has been more emotional. Pt states sometimes when she takes a sip of water it makes her sick, or the smell of food, or when she eats. Pt states there is not a trend with the nausea; its random. Pt states she does not track her protein often. Pt states she drinks juice sometimes because she was tired of drinking water only. Pt reports being excited about purchasing and wearing boots.   Pt states she will call back for follow-up appt or with any questions.   Preferred Learning Style:   No preference indicated   Learning Readiness:   Ready  Change in progress  24-hr recall: B (AM): egg (6g) or 1/2 sausage (2g) Snk (AM): 1/2 sausage (2g)  L (PM): 2-3 oz deli Malawiturkey with 2 oz cheese (28-42g) Snk (PM): greek yogurt (15g)  D (PM): 1-2 oz baked pork chop/chicken (7-14g) Snk (PM): protein shake (rarely)  Fluid intake: water with flavor pack, Nature's twist,  Snapple juice; 96 ounces  Estimated total protein intake: 63 grams  Medications: See list Supplementation: Celebrate + 3 TUMS  CBG monitoring: once a day Average CBG per patient: FBS (95-115) Last  patient reported A1c: 5.6 (Nov 2018)  Using straws: yes Drinking while eating: no Having you been chewing well: yes Chewing/swallowing difficulties: no Changes in vision: no Changes to mood/headaches: no, no Hair loss/Changes to skin/Changes to nails: no, no, no Any difficulty focusing or concentrating: no Sweating: no Dizziness/Lightheaded: no Palpitations: no Carbonated beverages: no N/V/D/C/GAS: sometimes when eating or certain smells, no, no, no, no Abdominal Pain: no  Dumping syndrome: no Last Lap-Band fill: N/A  Recent physical activity:  walking 45-60 min, 3x/week  Progress Towards Goal(s):  In progress.  Handouts given during visit include:  Phase 3B: High protein + NS vegetables   Nutritional Diagnosis:  [place]-3.3 Overweight/obesity related to past poor dietary habits and physical inactivity as evidenced by patient w/ recent sleeve gastrectomy surgery following dietary guidelines for continued weight loss.     Intervention:  Nutrition education and counseling. Goals:  Follow Phase 3B: High Protein + Non-Starchy Vegetables  Eat 3-6 small meals/snacks, every 3-5 hrs  Increase lean protein foods to meet 60g goal  Increase fluid intake to 64oz +  Avoid drinking 15 minutes before, during and 30 minutes after eating  Aim for >30 min of physical activity daily  - Track protein intake.   Teaching Method Utilized:  Visual Auditory Hands on  Barriers to learning/adherence to lifestyle change: none identified  Demonstrated degree of understanding via:  Teach Back   Monitoring/Evaluation:  Dietary intake, exercise, lap band fills, and body weight. Follow  up prn. Pt states she will call back when she has a question and feels the needs to schedule an appointment.

## 2017-07-06 NOTE — Patient Instructions (Signed)
Goals:  Follow Phase 3B: High Protein + Non-Starchy Vegetables  Eat 3-6 small meals/snacks, every 3-5 hrs  Increase lean protein foods to meet 60g goal  Increase fluid intake to 64oz +  Avoid drinking 15 minutes before, during and 30 minutes after eating  Aim for >30 min of physical activity daily  - Track protein intake.

## 2017-07-15 ENCOUNTER — Telehealth: Payer: Self-pay | Admitting: *Deleted

## 2017-07-15 NOTE — Telephone Encounter (Signed)
Called & spoke with pt. Informed her that Dr. Lucia GaskinsAhern will be out of office & 2/4 appt will need to be r/s. She verbalized understanding and asked for a Friday for r/s. Told patient I would call her back. She verbalized appreciation.

## 2017-07-16 ENCOUNTER — Ambulatory Visit: Payer: BLUE CROSS/BLUE SHIELD | Admitting: Neurology

## 2017-07-17 NOTE — Telephone Encounter (Signed)
Called patient to r/s her appt. LVM with office number asking for call back.  

## 2017-08-01 ENCOUNTER — Other Ambulatory Visit: Payer: Self-pay | Admitting: *Deleted

## 2017-08-02 ENCOUNTER — Encounter: Payer: Self-pay | Admitting: Neurology

## 2017-08-02 ENCOUNTER — Telehealth: Payer: Self-pay | Admitting: Neurology

## 2017-08-02 NOTE — Telephone Encounter (Signed)
Message   ----- Message from Mychart, Generic sent at 08/02/2017 10:12 AM EST -----    Appointment Request From: Cassandra Tucker    With Provider: Anson FretAhern, Antonia B, MD [[facility]]    Preferred Date Range: Any    Preferred Times: Monday Morning, Monday Afternoon, Tuesday Morning, Tuesday Afternoon, Wednesday Morning, Wednesday Afternoon, Thursday Morning, Thursday Afternoon, Friday Morning    Reason for visit: Request an Appointment    Comments:  I prefer to be seen on a Monday or Friday if possible.

## 2017-08-02 NOTE — Telephone Encounter (Signed)
Called patient to schedule follow-up. Left patient a voicemail.  Patient needs next available appointment with NP, Butch PennyMegan Millikan.

## 2017-08-03 MED ORDER — LIRAGLUTIDE 18 MG/3ML ~~LOC~~ SOPN: PEN_INJECTOR | SUBCUTANEOUS | 0 refills | Status: DC

## 2017-08-06 ENCOUNTER — Telehealth: Payer: Self-pay | Admitting: Neurology

## 2017-08-06 NOTE — Telephone Encounter (Signed)
Called patient to schedule follow up.

## 2017-08-07 NOTE — Telephone Encounter (Signed)
LVM for pt to call back and schedule this appt with NP MM.

## 2017-08-09 NOTE — Telephone Encounter (Signed)
Called pt & LVM asking for call back to r/s patient.

## 2017-08-10 ENCOUNTER — Ambulatory Visit: Payer: BLUE CROSS/BLUE SHIELD | Admitting: Family Medicine

## 2017-08-10 ENCOUNTER — Encounter: Payer: Self-pay | Admitting: Family Medicine

## 2017-08-10 VITALS — BP 155/97 | HR 98 | Temp 97.1°F | Ht 66.0 in | Wt 318.6 lb

## 2017-08-10 DIAGNOSIS — S76311A Strain of muscle, fascia and tendon of the posterior muscle group at thigh level, right thigh, initial encounter: Secondary | ICD-10-CM

## 2017-08-10 DIAGNOSIS — R200 Anesthesia of skin: Secondary | ICD-10-CM | POA: Diagnosis not present

## 2017-08-10 DIAGNOSIS — E119 Type 2 diabetes mellitus without complications: Secondary | ICD-10-CM | POA: Diagnosis not present

## 2017-08-10 LAB — BAYER DCA HB A1C WAIVED: HB A1C: 4.8 % (ref <7.0)

## 2017-08-10 MED ORDER — LIRAGLUTIDE 18 MG/3ML ~~LOC~~ SOPN: 1.2000 mg | PEN_INJECTOR | Freq: Every day | SUBCUTANEOUS | 3 refills | Status: DC

## 2017-08-10 NOTE — Patient Instructions (Signed)
Great to see you!  Come back in 4 months unless you need us sooner.    

## 2017-08-10 NOTE — Progress Notes (Signed)
   HPI  Patient presents today here with leg pain, diabetes, and finger numbness.  Leg pain 2-3 weeks, now improving. Patient states that she has right buttock pain that radiates to the right popliteal fossa. Is worse with walking up stairs and certain positions. She denies any discrete injury or strain. She has been working on losing weight and has been walking frequently.  She complains of right second and third digit numbness on the fingertips. Is worse when driving. No nighttime symptoms.  Diabetes Very good compliance with Victoza, she has lost 50+ pounds since her gastric sleeve.    PMH: Smoking status noted ROS: Per HPI  Objective: BP (!) 155/97   Pulse 98   Temp (!) 97.1 F (36.2 C) (Oral)   Ht 5\' 6"  (1.676 m)   Wt (!) 318 lb 9.6 oz (144.5 kg)   BMI 51.42 kg/m  Gen: NAD, alert, cooperative with exam HEENT: NCAT CV: RRR, good S1/S2, no murmur Resp: CTABL, no wheezes, non-labored Ext: No edema, warm Neuro: Alert and oriented, No gross deficits  Assessment and plan:  #Type 2 diabetes Likely very well controlled, A1c pending. Refilled Victoza.  #Finger numbness Likely mild carpal tunnel syndrome Negative Tinel's and Phalen's test today Trial of cockup splint  #Right hamstring strain Improving Handout from sports medicine patient advisor given Supportive care only for now   Orders Placed This Encounter  Procedures  . Bayer DCA Hb A1c Waived    Meds ordered this encounter  Medications  . liraglutide (VICTOZA) 18 MG/3ML SOPN    Sig: Inject 0.2 mLs (1.2 mg total) into the skin daily. INJECT 0.2 MLS (1.2 MG TOTAL) INTO THE SKIN DAILY.    Dispense:  18 mL    Refill:  3    Murtis SinkSam Annaya Bangert, MD [facility] 08/10/2017, 4:52 PM

## 2017-10-10 ENCOUNTER — Encounter: Payer: Self-pay | Admitting: Family Medicine

## 2017-10-10 ENCOUNTER — Ambulatory Visit: Payer: BLUE CROSS/BLUE SHIELD | Admitting: Family Medicine

## 2017-10-10 VITALS — BP 139/86 | HR 71 | Temp 98.0°F | Ht 66.0 in | Wt 298.0 lb

## 2017-10-10 DIAGNOSIS — G932 Benign intracranial hypertension: Secondary | ICD-10-CM

## 2017-10-10 MED ORDER — ACETAZOLAMIDE 250 MG PO TABS: 500.0000 mg | ORAL_TABLET | Freq: Two times a day (BID) | ORAL | 0 refills | Status: DC

## 2017-10-10 NOTE — Progress Notes (Signed)
BP 139/86   Pulse 71   Temp 98 F (36.7 C) (Oral)   Ht  (1.676 m)   Wt 298 lb (135.2 kg)   BMI 48.10 kg/m    Subjective:    Patient ID: Cassandra Tucker, female    DOB: 1990/11/01, 27 y.o.   MRN: [id number]  HPI: Cassandra Tucker is a 27 y.o. female presenting on 10/10/2017 for Headache (5 days) and Dizziness (Intermittent)   HPI Headaches and dizziness Patient is coming in with complaints of headache and dizziness that is been going on more frequently over the past 5 days.  She says when she gets like this is when her pseudotumor cerebri seems to be acting up.  She has not been with a neurology in quite some time.  She denies any visual deficits.  She denies any nausea or vomiting.  She does feel dizzy and sometimes like the room is spinning.  Relevant past medical, surgical, family and social history reviewed and updated as indicated. Interim medical history since our last visit reviewed. Allergies and medications reviewed and updated.  Review of Systems  Constitutional: Negative for chills and fever.  Eyes: Negative for visual disturbance.  Respiratory: Negative for chest tightness and shortness of breath.   Cardiovascular: Negative for chest pain and leg swelling.  Gastrointestinal: Negative for abdominal pain, diarrhea and nausea.  Musculoskeletal: Negative for back pain and gait problem.  Skin: Negative for rash.  Neurological: Positive for dizziness and headaches. Negative for weakness, light-headedness and numbness.  Psychiatric/Behavioral: Negative for agitation and behavioral problems.  All other systems reviewed and are negative.   Per HPI unless specifically indicated above   Allergies as of 10/10/2017   No Known Allergies     Medication List        Accurate as of 10/10/17  6:21 PM. Always use your most recent med list.          albuterol 108 (90 Base) MCG/ACT inhaler Commonly known as:  PROVENTIL HFA;VENTOLIN HFA Inhale 2 puffs into the lungs  every 6 (six) hours as needed for wheezing or shortness of breath.   buPROPion 150 MG 12 hr tablet Commonly known as:  WELLBUTRIN SR Take 1 tablet (150 mg total) daily with breakfast by mouth.   liraglutide 18 MG/3ML Sopn Commonly known as:  VICTOZA Inject 0.2 mLs (1.2 mg total) into the skin daily. INJECT 0.2 MLS (1.2 MG TOTAL) INTO THE SKIN DAILY.   Pen Needles 32G X 4 MM Misc 1 each by Does not apply route daily.          Objective:    BP 139/86   Pulse 71   Temp 98 F (36.7 C) (Oral)   Ht  (1.676 m)   Wt 298 lb (135.2 kg)   BMI 48.10 kg/m   Wt Readings from Last 3 Encounters:  10/10/17 298 lb (135.2 kg)  08/10/17 (!) 318 lb 9.6 oz (144.5 kg)  07/06/17 (!) 330 lb 12.8 oz (150 kg)    Physical Exam  Constitutional: She is oriented to person, place, and time. She appears well-developed and well-nourished. No distress.  Eyes: Pupils are equal, round, and reactive to light. Conjunctivae and EOM are normal.  Cardiovascular: Normal rate, regular rhythm, normal heart sounds and intact distal pulses.  No murmur heard. Pulmonary/Chest: Effort normal and breath sounds normal. No respiratory distress. She has no wheezes.  Musculoskeletal: Normal range of motion. She exhibits no edema or tenderness.  Neurological: She is  alert and oriented to person, place, and time. She has normal strength. No cranial nerve deficit or sensory deficit. Coordination and gait normal.  Skin: Skin is warm and dry. No rash noted. She is not diaphoretic.  Psychiatric: She has a normal mood and affect. Her behavior is normal.  Nursing note and vitals reviewed.       Assessment & Plan:   Problem List Items Addressed This Visit      Nervous and Auditory   Pseudotumor cerebri syndrome - Primary   Relevant Medications   acetaZOLAMIDE (DIAMOX) 250 MG tablet      Gave patient 1 month of acetazolamide, recommended for her to go back to her neurologist ASAP  Follow up plan: Return if symptoms  worsen or fail to improve.  Counseling provided for all of the vaccine components No orders of the defined types were placed in this encounter.   Arville Care, MD [facility] 10/10/2017, 6:21 PM

## 2017-10-22 ENCOUNTER — Ambulatory Visit: Payer: BLUE CROSS/BLUE SHIELD | Admitting: Family Medicine

## 2017-11-16 ENCOUNTER — Ambulatory Visit: Payer: BLUE CROSS/BLUE SHIELD | Admitting: Family Medicine

## 2017-11-20 ENCOUNTER — Ambulatory Visit: Payer: BLUE CROSS/BLUE SHIELD | Admitting: Skilled Nursing Facility1

## 2017-11-21 ENCOUNTER — Encounter: Payer: Self-pay | Admitting: Nurse Practitioner

## 2018-02-06 ENCOUNTER — Emergency Department (HOSPITAL_COMMUNITY)
Admission: EM | Admit: 2018-02-06 | Discharge: 2018-02-06 | Disposition: A | Discharge status: Home / Self Care | Payer: Medicaid Other | Attending: Emergency Medicine | Admitting: Emergency Medicine

## 2018-02-06 ENCOUNTER — Encounter (HOSPITAL_COMMUNITY): Payer: Self-pay | Admitting: Emergency Medicine

## 2018-02-06 DIAGNOSIS — R112 Nausea with vomiting, unspecified: Secondary | ICD-10-CM

## 2018-02-06 DIAGNOSIS — I10 Essential (primary) hypertension: Secondary | ICD-10-CM | POA: Insufficient documentation

## 2018-02-06 DIAGNOSIS — Z794 Long term (current) use of insulin: Secondary | ICD-10-CM | POA: Insufficient documentation

## 2018-02-06 DIAGNOSIS — R1013 Epigastric pain: Secondary | ICD-10-CM | POA: Insufficient documentation

## 2018-02-06 DIAGNOSIS — E119 Type 2 diabetes mellitus without complications: Secondary | ICD-10-CM | POA: Insufficient documentation

## 2018-02-06 LAB — URINALYSIS, ROUTINE W REFLEX MICROSCOPIC
Bilirubin Urine: NEGATIVE
Glucose, UA: NEGATIVE mg/dL
Hgb urine dipstick: NEGATIVE
Ketones, ur: 5 mg/dL — AB
Nitrite: NEGATIVE
Protein, ur: NEGATIVE mg/dL
SPECIFIC GRAVITY, URINE: 1.026 (ref 1.005–1.030)
pH: 5 (ref 5.0–8.0)

## 2018-02-06 LAB — COMPREHENSIVE METABOLIC PANEL
ALT: 12 U/L (ref 0–44)
AST: 14 U/L — ABNORMAL LOW (ref 15–41)
Albumin: 4 g/dL (ref 3.5–5.0)
Alkaline Phosphatase: 56 U/L (ref 38–126)
Anion gap: 7 (ref 5–15)
BILIRUBIN TOTAL: 0.9 mg/dL (ref 0.3–1.2)
BUN: 9 mg/dL (ref 6–20)
CHLORIDE: 108 mmol/L (ref 98–111)
CO2: 25 mmol/L (ref 22–32)
Calcium: 9.3 mg/dL (ref 8.9–10.3)
Creatinine, Ser: 0.6 mg/dL (ref 0.44–1.00)
GFR calc Af Amer: >60 mL/min (ref >60)
GFR calc non Af Amer: >60 mL/min (ref >60)
GLUCOSE: 79 mg/dL (ref 70–99)
POTASSIUM: 4.3 mmol/L (ref 3.5–5.1)
Sodium: 140 mmol/L (ref 135–145)
TOTAL PROTEIN: 6.9 g/dL (ref 6.5–8.1)

## 2018-02-06 LAB — CBC
HEMATOCRIT: 43.5 % (ref 36.0–46.0)
Hemoglobin: 13.4 g/dL (ref 12.0–15.0)
MCH: 26.6 pg (ref 26.0–34.0)
MCHC: 30.8 g/dL (ref 30.0–36.0)
MCV: 86.3 fL (ref 78.0–100.0)
Platelets: 245 10*3/uL (ref 150–400)
RBC: 5.04 MIL/uL (ref 3.87–5.11)
RDW: 13.6 % (ref 11.5–15.5)
WBC: 8.8 10*3/uL (ref 4.0–10.5)

## 2018-02-06 LAB — LIPASE, BLOOD: Lipase: 28 U/L (ref 11–51)

## 2018-02-06 LAB — I-STAT BETA HCG BLOOD, ED (MC, WL, AP ONLY)

## 2018-02-06 MED ORDER — ONDANSETRON 4 MG PO TBDP: ORAL_TABLET | ORAL | 0 refills | Status: DC

## 2018-02-06 MED ORDER — FAMOTIDINE 20 MG PO TABS
20.0000 mg | ORAL_TABLET | Freq: Once | ORAL | Status: AC
Start: 2018-02-06 — End: 2018-02-06
  Administered 2018-02-06: 20 mg via ORAL
  Filled 2018-02-06: qty 1

## 2018-02-06 MED ORDER — ONDANSETRON 4 MG PO TBDP
4.0000 mg | ORAL_TABLET | Freq: Once | ORAL | Status: AC
  Administered 2018-02-06: 4 mg via ORAL
  Filled 2018-02-06: qty 1

## 2018-02-06 MED ORDER — FAMOTIDINE 20 MG PO TABS: 20.0000 mg | ORAL_TABLET | Freq: Two times a day (BID) | ORAL | 0 refills | Status: DC

## 2018-02-06 NOTE — ED Notes (Signed)
Patient verbalized understanding of discharge instructions and denies any further needs or questions at this time. VS stable. Patient ambulatory with steady gait.  

## 2018-02-06 NOTE — ED Provider Notes (Signed)
Patient placed in Quick Look pathway, seen and evaluated   Chief Complaint: abdominal pain  HPI: Cassandra Tucker is a 27 y.o. female G0 with LMP 01/02/17 who presents to the ED for evaluation of 3 weeks intermittent nausea, vomiting, and epigastric abdominal pain. Reports symptoms occur after eating.   ROS: GI: n/v, abdominal pain  Physical Exam:  BP (!) 162/110 (BP Location: Right Arm)   Pulse 74   Temp (!) 97.4 F (36.3 C) (Oral)   Resp 16   LMP 01/02/2018   SpO2 100%    Gen: No distress  Neuro: Awake and Alert  Skin: Warm and dry  Abdomen: soft mildly tender epigastric area.    Initiation of care has begun. The patient has been counseled on the process, plan, and necessity for staying for the completion/evaluation, and the remainder of the medical screening examination    Janne Napoleoneese, Hope M, NP 02/06/18 1404    Gerhard MunchLockwood, Robert, MD 02/07/18 2037

## 2018-02-06 NOTE — Discharge Instructions (Signed)
Your labs and evaluation are reassuring today.  Symptoms may be due to irritation of the stomach lining or acid reflux.  Please take Pepcid twice daily, 30 to 60 minutes before breakfast and bedtime.  You may use Zofran as needed for nausea.  If you continue to have symptoms you can also use Maalox.  If symptoms persist please follow-up with your primary care doctor or surgeon from your previous gastric sleeve operation.  Return to the emergency department for significantly worsened abdominal pain, fevers, persistent vomiting, blood in vomit or stool or any other new or concerning symptoms.

## 2018-02-06 NOTE — ED Provider Notes (Signed)
[facility] EMERGENCY DEPARTMENT Provider Note   [name]: [id number] Arrival date & time: 02/06/18  1308     History   Chief Complaint Chief Complaint  Patient presents with  . Nausea  . Emesis  . Abdominal Pain    HPI Cassandra Tucker is a 27 y.o. female.  Cassandra Tucker is a 26 y.o. Female with a history of hypertension, hyperlipidemia, diabetes, GERD, sleep apnea, kind box disease and headaches, who presents to the emergency department for evaluation of intermittent nausea vomiting and epigastric abdominal pain.  Patient reports symptoms have been occurring intermittently for the past 3 weeks.  She continues to be able to eat and drink, but does report that eating tends to make her symptoms worse.  She reports intermittent burning discomfort in the epigastric region, denies chest pain or shortness of breath.  She reports she frequently feels nauseated after eating and has had a few episodes of vomiting, denies hematemesis, no diarrhea, melena, hematochezia or constipation.  Patient denies any dysuria or urinary frequency.  Denies vaginal bleeding or vaginal discharge, no pelvic discomfort.  She has not tried anything to treat her symptoms prior to arrival.  Has not followed up with any provider regarding this.  Reports history of gastric sleeve surgery, no other abdominal surgeries.  The history is provided by the patient.    Past Medical History:  Diagnosis Date  . Ankle fracture, left 2000  . Anxiety   . Arthritis    left wrist and left ankle  . Back pain   . Bronchitis   . Diabetes (HCC) 01/11/2016   type II   . Family history of adverse reaction to anesthesia    gmother had problems with N/V   . GERD (gastroesophageal reflux disease)   . Headache   . History of kidney stones   . HLD (hyperlipidemia)   . Hypertension 2013  . Intracranial hypertension    psuedotumor cebrum  . Joint pain   . Kienbock's disease   . Leg edema   . OSA (obstructive  sleep apnea)    cpap - does not know settings   . Papilledema   . RLS (restless legs syndrome)   . Sinus tachycardia     Patient Active Problem List   Diagnosis Date Noted  . Vitamin D deficiency 02/26/2017  . Class 3 obesity with serious comorbidity and body mass index (BMI) of 60.0 to 69.9 in adult 02/26/2017  . Controlled type 2 diabetes mellitus without complication, without long-term current use of insulin (HCC) 04/21/2016  . Gastroesophageal reflux disease without esophagitis 04/21/2016  . Pseudotumor cerebri syndrome 11/29/2015  . Super obesity 11/29/2015  . OSA on CPAP 11/29/2015  . RLS (restless legs syndrome) 11/29/2015  . Papilledema 06/16/2015  . Pain in the wrist 04/28/2014  . 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 HLD (hyperlipidemia) 01/21/2014  . Sinus tachycardia 12/04/2011  . Essential hypertension 12/04/2011    Past Surgical History:  Procedure Laterality Date  . FRACTURE SURGERY  2005   ankle  . FRACTURE SURGERY  2013   wrist (deteriorating bone)  . LAPAROSCOPIC GASTRIC SLEEVE RESECTION N/A 05/08/2017   Procedure: LAPAROSCOPIC GASTRIC SLEEVE RESECTION;  Surgeon: Berna Bue, MD;  Location: [facility] ORS;  Service: General;  Laterality: N/A;  . LAPAROSCOPIC GASTRIC SLEEVE RESECTION  05/08/2017  . UPPER GI ENDOSCOPY  05/08/2017   Procedure: UPPER GI ENDOSCOPY;  Surgeon: Berna Bue, MD;  Location: [facility];  Service: General;;  . WRIST  SURGERY  2015     OB History    Gravida  0   Para  0   Term  0   Preterm  0   AB  0   Living  0     SAB  0   TAB  0   Ectopic  0   Multiple  0   Live Births  0            Home Medications    Prior to Admission medications   Medication Sig Start Date End Date Taking? Authorizing Provider  acetaZOLAMIDE (DIAMOX) 250 MG tablet Take 2 tablets (500 mg total) by mouth 2 (two) times daily. 10/10/17   Dettinger, Elige Radon, MD  albuterol (PROVENTIL HFA;VENTOLIN HFA) 108 (90 Base) MCG/ACT inhaler Inhale 2  puffs into the lungs every 6 (six) hours as needed for wheezing or shortness of breath. 05/26/16   Remus Loffler, PA-C  buPROPion (WELLBUTRIN SR) 150 MG 12 hr tablet Take 1 tablet (150 mg total) daily with breakfast by mouth. 04/16/17 05/16/17  Quillian Quince D, MD  famotidine (PEPCID) 20 MG tablet Take 1 tablet (20 mg total) by mouth 2 (two) times daily. 02/06/18   Dartha Lodge, PA-C  Insulin Pen Needle (PEN NEEDLES) 32G X 4 MM MISC 1 each by Does not apply route daily. 12/04/16   Henrene Pastor, PharmD  liraglutide (VICTOZA) 18 MG/3ML SOPN Inject 0.2 mLs (1.2 mg total) into the skin daily. INJECT 0.2 MLS (1.2 MG TOTAL) INTO THE SKIN DAILY. 08/10/17   Elenora Gamma, MD  ondansetron (ZOFRAN ODT) 4 MG disintegrating tablet 4mg  ODT q4 hours prn nausea/vomit 02/06/18   Dartha Lodge, PA-C    Family History Family History  Problem Relation Age of Onset  . Heart attack Mother   . Hypertension Mother   . Obesity Mother   . Depression Mother   . Anxiety disorder Mother   . Bipolar disorder Mother   . Cancer Father   . Migraines Neg Hx     Social History Social History   Tobacco Use  . Smoking status: Never Smoker  . Smokeless tobacco: Never Used  Substance Use Topics  . Alcohol use: Yes    Alcohol/week: 0.0 standard drinks    Comment: socially  . Drug use: No     Allergies   Patient has no known allergies.   Review of Systems Review of Systems  Constitutional: Negative for chills and fever.  HENT: Negative.   Respiratory: Negative for cough and shortness of breath.   Cardiovascular: Negative for chest pain.  Gastrointestinal: Positive for abdominal pain, nausea and vomiting. Negative for blood in stool, constipation and diarrhea.  Genitourinary: Negative for dysuria, flank pain, frequency, hematuria, pelvic pain, vaginal bleeding and vaginal discharge.  Musculoskeletal: Negative for arthralgias, back pain and myalgias.  Skin: Negative for color change and rash.    Neurological: Negative for dizziness, syncope and light-headedness.     Physical Exam Updated Vital Signs BP (!) 162/110 (BP Location: Right Arm)   Pulse 74   Temp (!) 97.4 F (36.3 C) (Oral)   Resp 16   LMP 01/02/2018   SpO2 100%   Physical Exam  Constitutional: She is oriented to person, place, and time. She appears well-developed and well-nourished. She does not appear ill. No distress.  HENT:  Head: Normocephalic and atraumatic.  Mouth/Throat: Oropharynx is clear and moist.  Eyes: Right eye exhibits no discharge. Left eye exhibits no discharge.  Cardiovascular: Normal  rate, regular rhythm, normal heart sounds and intact distal pulses.  Pulmonary/Chest: Effort normal and breath sounds normal. No respiratory distress.  Respirations equal and unlabored, patient able to speak in full sentences, lungs clear to auscultation bilaterally  Abdominal: Soft. Normal appearance and bowel sounds are normal. She exhibits no distension. There is tenderness in the epigastric area. There is no rigidity, no rebound and no guarding.  Abdomen soft, nondistended, bowel sounds present throughout, there is minimal tenderness in the epigastric region without guarding, all other quadrants nontender, no peritoneal signs, no CVA tenderness bilaterally  Neurological: She is alert and oriented to person, place, and time. Coordination normal.  Skin: Skin is warm and dry. Capillary refill takes less than 2 seconds. She is not diaphoretic.  Psychiatric: She has a normal mood and affect. Her behavior is normal.  Nursing note and vitals reviewed.    ED Treatments / Results  Labs (all labs ordered are listed, but only abnormal results are displayed) Labs Reviewed  COMPREHENSIVE METABOLIC PANEL - Abnormal; Notable for the following components:      Result Value   AST 14 (*)    All other components within normal limits  URINALYSIS, ROUTINE W REFLEX MICROSCOPIC - Abnormal; Notable for the following  components:   Color, Urine AMBER (*)    APPearance CLOUDY (*)    Ketones, ur 5 (*)    Leukocytes, UA LARGE (*)    Bacteria, UA RARE (*)    Squamous Epithelial / LPF >50 (*)    All other components within normal limits  LIPASE, BLOOD  CBC  I-STAT BETA HCG BLOOD, ED ([facility], [facility], [facility] ONLY)    EKG None  Radiology No results found.  Procedures Procedures (including critical care time)  Medications Ordered in ED Medications  ondansetron (ZOFRAN-ODT) disintegrating tablet 4 mg (4 mg Oral Given 02/06/18 1629)  famotidine (PEPCID) tablet 20 mg (20 mg Oral Given 02/06/18 1629)     Initial Impression / Assessment and Plan / ED Course  I have reviewed the triage vital signs and the nursing notes.  Pertinent labs & imaging results that were available during my care of the patient were reviewed by me and considered in my medical decision making (see chart for details).  Patient presents for nausea, vomiting and epigastric abdominal discomfort.  Symptoms have been present intermittently over the past 3 and are most associated with eating.  On arrival patient hypertensive but vitals otherwise normal.  No chest pain, shortness of breath or other symptoms to suggest hypertensive urgency.  On exam she has minimal tenderness in the epigastrium without focal guarding, exam otherwise very reassuring.  Abdominal labs obtained from triage.  Labs show no leukocytosis and normal hemoglobin, no acute electrolyte derangements and normal renal and liver function, normal lipase.  Negative pregnancy.  Analysis appears very contaminated with greater than 50 squamous cells, does show some leukocytes and rare bacteria but I suspect this is more likely due to contamination as patient is not having any urinary symptoms.  Discussed reassuring lab work with the patient.  She is currently asymptomatic.  I suspect that her symptoms may be due to GERD or gastritis.  Will treat with Zofran and Pepcid and discharged with  prescriptions for the same.  Patient to follow-up with primary care doctor.  Appropriate return precautions discussed.   At this time there does not appear to be any evidence of an acute emergency medical condition and the patient appears stable for discharge with appropriate outpatient follow  up.Diagnosis was discussed with patient who verbalizes understanding and is agreeable to discharge.  Final Clinical Impressions(s) / ED Diagnoses   Final diagnoses:  Non-intractable vomiting with nausea, unspecified vomiting type  Epigastric pain    ED Discharge Orders         Ordered    famotidine (PEPCID) 20 MG tablet  2 times daily     02/06/18 1627    ondansetron (ZOFRAN ODT) 4 MG disintegrating tablet     02/06/18 1627           Jodi GeraldsFord, Ronell Boldin [place], [place]-C 02/07/18 0057    Charlynne PanderYao, David Hsienta, MD 02/09/18 2217

## 2018-02-06 NOTE — ED Triage Notes (Signed)
Pt to ER for evaluation of 3 weeks intermittent nausea, vomiting, and epigastric abdominal pain. Reports symptoms occur after eating. Pt in NAD.

## 2018-04-03 ENCOUNTER — Ambulatory Visit: Payer: Self-pay | Admitting: Obstetrics and Gynecology

## 2018-04-03 ENCOUNTER — Ambulatory Visit: Payer: Self-pay | Admitting: Obstetrics & Gynecology

## 2018-04-04 ENCOUNTER — Ambulatory Visit (INDEPENDENT_AMBULATORY_CARE_PROVIDER_SITE_OTHER): Payer: Medicaid Other | Admitting: Obstetrics & Gynecology

## 2018-04-04 ENCOUNTER — Encounter: Payer: Self-pay | Admitting: Obstetrics & Gynecology

## 2018-04-04 ENCOUNTER — Other Ambulatory Visit (HOSPITAL_COMMUNITY)
Admission: RE | Admit: 2018-04-04 | Discharge: 2018-04-04 | Disposition: A | Discharge status: Home / Self Care | Payer: Medicaid Other | Source: Ambulatory Visit | Attending: Obstetrics & Gynecology | Admitting: Obstetrics & Gynecology

## 2018-04-04 VITALS — BP 135/81 | HR 84 | Ht 66.0 in | Wt 255.0 lb

## 2018-04-04 DIAGNOSIS — Z01419 Encounter for gynecological examination (general) (routine) without abnormal findings: Secondary | ICD-10-CM

## 2018-04-04 DIAGNOSIS — Z3009 Encounter for other general counseling and advice on contraception: Secondary | ICD-10-CM

## 2018-04-04 DIAGNOSIS — Z0000 Encounter for general adult medical examination without abnormal findings: Secondary | ICD-10-CM | POA: Diagnosis not present

## 2018-04-04 NOTE — Patient Instructions (Signed)
 Contraception Choices Contraception, also called birth control, refers to methods or devices that prevent pregnancy. Hormonal methods Contraceptive implant A contraceptive implant is a thin, plastic tube that contains a hormone. It is inserted into the upper part of the arm. It can remain in place for up to 3 years. Progestin-only injections Progestin-only injections are injections of progestin, a synthetic form of the hormone progesterone. They are given every 3 months by a health care provider. Birth control pills Birth control pills are pills that contain hormones that prevent pregnancy. They must be taken once a day, preferably at the same time each day. Birth control patch The birth control patch contains hormones that prevent pregnancy. It is placed on the skin and must be changed once a week for three weeks and removed on the fourth week. A prescription is needed to use this method of contraception. Vaginal ring A vaginal ring contains hormones that prevent pregnancy. It is placed in the vagina for three weeks and removed on the fourth week. After that, the process is repeated with a new ring. A prescription is needed to use this method of contraception. Emergency contraceptive Emergency contraceptives prevent pregnancy after unprotected sex. They come in pill form and can be taken up to 5 days after sex. They work best the sooner they are taken after having sex. Most emergency contraceptives are available without a prescription. This method should not be used as your only form of birth control. Barrier methods Female condom A female condom is a thin sheath that is worn over the penis during sex. Condoms keep sperm from going inside a woman's body. They can be used with a spermicide to increase their effectiveness. They should be disposed after a single use. Female condom A female condom is a soft, loose-fitting sheath that is put into the vagina before sex. The condom keeps sperm from  going inside a woman's body. They should be disposed after a single use. Diaphragm A diaphragm is a soft, dome-shaped barrier. It is inserted into the vagina before sex, along with a spermicide. The diaphragm blocks sperm from entering the uterus, and the spermicide kills sperm. A diaphragm should be left in the vagina for 6-8 hours after sex and removed within 24 hours. A diaphragm is prescribed and fitted by a health care provider. A diaphragm should be replaced every 1-2 years, after giving birth, after gaining more than 15 lb (6.8 kg), and after pelvic surgery. Cervical cap A cervical cap is a round, soft latex or plastic cup that fits over the cervix. It is inserted into the vagina before sex, along with spermicide. It blocks sperm from entering the uterus. The cap should be left in place for 6-8 hours after sex and removed within 48 hours. A cervical cap must be prescribed and fitted by a health care provider. It should be replaced every 2 years. Sponge A sponge is a soft, circular piece of polyurethane foam with spermicide on it. The sponge helps block sperm from entering the uterus, and the spermicide kills sperm. To use it, you make it wet and then insert it into the vagina. It should be inserted before sex, left in for at least 6 hours after sex, and removed and thrown away within 30 hours. Spermicides Spermicides are chemicals that kill or block sperm from entering the cervix and uterus. They can come as a cream, jelly, suppository, foam, or tablet. A spermicide should be inserted into the vagina with an applicator at least 10-15 minutes   before sex to allow time for it to work. The process must be repeated every time you have sex. Spermicides do not require a prescription. Intrauterine contraception Intrauterine device (IUD) An IUD is a T-shaped device that is put in a woman's uterus. There are two types:  Hormone IUD.This type contains progestin, a synthetic form of the hormone  progesterone. This type can stay in place for 3-5 years.  Copper IUD.This type is wrapped in copper wire. It can stay in place for 10 years.  Permanent methods of contraception Female tubal ligation In this method, a woman's fallopian tubes are sealed, tied, or blocked during surgery to prevent eggs from traveling to the uterus. Hysteroscopic sterilization In this method, a small, flexible insert is placed into each fallopian tube. The inserts cause scar tissue to form in the fallopian tubes and block them, so sperm cannot reach an egg. The procedure takes about 3 months to be effective. Another form of birth control must be used during those 3 months. Female sterilization This is a procedure to tie off the tubes that carry sperm (vasectomy). After the procedure, the man can still ejaculate fluid (semen). Natural planning methods Natural family planning In this method, a couple does not have sex on days when the woman could become pregnant. Calendar method This means keeping track of the length of each menstrual cycle, identifying the days when pregnancy can happen, and not having sex on those days. Ovulation method In this method, a couple avoids sex during ovulation. Symptothermal method This method involves not having sex during ovulation. The woman typically checks for ovulation by watching changes in her temperature and in the consistency of cervical mucus. Post-ovulation method In this method, a couple waits to have sex until after ovulation. Summary  Contraception, also called birth control, means methods or devices that prevent pregnancy.  Hormonal methods of contraception include implants, injections, pills, patches, vaginal rings, and emergency contraceptives.  Barrier methods of contraception can include female condoms, female condoms, diaphragms, cervical caps, sponges, and spermicides.  There are two types of IUDs (intrauterine devices). An IUD can be put in a woman's uterus to  prevent pregnancy for 3-5 years.  Permanent sterilization can be done through a procedure for males, females, or both.  Natural family planning methods involve not having sex on days when the woman could become pregnant. This information is not intended to replace advice given to you by your health care provider. Make sure you discuss any questions you have with your health care provider. Document Released: 05/29/2005 Document Revised: 07/01/2016 Document Reviewed: 07/01/2016 Elsevier Interactive Patient Education  2018 [employer] Inc.   Preventive Care 18-39 Years, Female Preventive care refers to lifestyle choices and visits with your health care provider that can promote health and wellness. What does preventive care include?  A yearly physical exam. This is also called an annual well check.  Dental exams once or twice a year.  Routine eye exams. Ask your health care provider how often you should have your eyes checked.  Personal lifestyle choices, including: ? Daily care of your teeth and gums. ? Regular physical activity. ? Eating a healthy diet. ? Avoiding tobacco and drug use. ? Limiting alcohol use. ? Practicing safe sex. ? Taking vitamin and mineral supplements as recommended by your health care provider. What happens during an annual well check? The services and screenings done by your health care provider during your annual well check will depend on your age, overall health, lifestyle risk factors,   and family history of disease. Counseling Your health care provider may ask you questions about your:  Alcohol use.  Tobacco use.  Drug use.  Emotional well-being.  Home and relationship well-being.  Sexual activity.  Eating habits.  Work and work environment.  Method of birth control.  Menstrual cycle.  Pregnancy history.  Screening You may have the following tests or measurements:  Height, weight, and BMI.  Diabetes screening. This is done by checking  your blood sugar (glucose) after you have not eaten for a while (fasting).  Blood pressure.  Lipid and cholesterol levels. These may be checked every 5 years starting at age 20.  Skin check.  Hepatitis C blood test.  Hepatitis B blood test.  Sexually transmitted disease (STD) testing.  BRCA-related cancer screening. This may be done if you have a family history of breast, ovarian, tubal, or peritoneal cancers.  Pelvic exam and Pap test. This may be done every 3 years starting at age 21. Starting at age 30, this may be done every 5 years if you have a Pap test in combination with an HPV test.  Discuss your test results, treatment options, and if necessary, the need for more tests with your health care provider. Vaccines Your health care provider may recommend certain vaccines, such as:  Influenza vaccine. This is recommended every year.  Tetanus, diphtheria, and acellular pertussis (Tdap, Td) vaccine. You may need a Td booster every 10 years.  Varicella vaccine. You may need this if you have not been vaccinated.  HPV vaccine. If you are 26 or younger, you may need three doses over 6 months.  Measles, mumps, and rubella (MMR) vaccine. You may need at least one dose of MMR. You may also need a second dose.  Pneumococcal 13-valent conjugate (PCV13) vaccine. You may need this if you have certain conditions and were not previously vaccinated.  Pneumococcal polysaccharide (PPSV23) vaccine. You may need one or two doses if you smoke cigarettes or if you have certain conditions.  Meningococcal vaccine. One dose is recommended if you are age 19-21 years and a first-year college student living in a residence hall, or if you have one of several medical conditions. You may also need additional booster doses.  Hepatitis A vaccine. You may need this if you have certain conditions or if you travel or work in places where you may be exposed to hepatitis A.  Hepatitis B vaccine. You may need  this if you have certain conditions or if you travel or work in places where you may be exposed to hepatitis B.  Haemophilus influenzae type b (Hib) vaccine. You may need this if you have certain risk factors.  Talk to your health care provider about which screenings and vaccines you need and how often you need them. This information is not intended to replace advice given to you by your health care provider. Make sure you discuss any questions you have with your health care provider. Document Released: 07/25/2001 Document Revised: 02/16/2016 Document Reviewed: 03/30/2015 Elsevier Interactive Patient Education  2018 Elsevier Inc.  

## 2018-04-04 NOTE — Progress Notes (Signed)
GYNECOLOGY ANNUAL PREVENTATIVE CARE ENCOUNTER NOTE  Subjective:   Cassandra Tucker is a 27 y.o. G0P0000 female here to establish care and for a routine annual gynecologic exam.  Current complaints: none.  Desires pap smear, annual STI screen and contraception counseling.   Denies abnormal vaginal bleeding, discharge, pelvic pain, problems with intercourse or other gynecologic concerns.    Gynecologic History Patient's last menstrual period was 03/18/2018 (exact date). Contraception: none Last Pap: 2015. Results were: normal with no history of cervical dysplasia   Obstetric History OB History  Gravida Para Term Preterm AB Living  0 0 0 0 0 0  SAB TAB Ectopic Multiple Live Births  0 0 0 0 0    Past Medical History:  Diagnosis Date  . Ankle fracture, left 2000  . Anxiety   . Arthritis    left wrist and left ankle  . Back pain   . Bronchitis   . Diabetes (HCC) 01/11/2016   type II   . Family history of adverse reaction to anesthesia    gmother had problems with N/V   . GERD (gastroesophageal reflux disease)   . Headache   . History of kidney stones   . HLD (hyperlipidemia)   . Hypertension 2013  . Intracranial hypertension    psuedotumor cebrum  . Joint pain   . Kienbock's disease   . Leg edema   . OSA (obstructive sleep apnea)    cpap - does not know settings   . Papilledema   . RLS (restless legs syndrome)   . Sinus tachycardia     Past Surgical History:  Procedure Laterality Date  . FRACTURE SURGERY  2005   ankle  . FRACTURE SURGERY  2013   wrist (deteriorating bone)  . LAPAROSCOPIC GASTRIC SLEEVE RESECTION N/A 05/08/2017   Procedure: LAPAROSCOPIC GASTRIC SLEEVE RESECTION;  Surgeon: Berna Bue, MD;  Location: [facility];  Service: General;  Laterality: N/A;  . LAPAROSCOPIC GASTRIC SLEEVE RESECTION  05/08/2017  . UPPER GI ENDOSCOPY  05/08/2017   Procedure: UPPER GI ENDOSCOPY;  Surgeon: Berna Bue, MD;  Location: [facility];  Service: General;;  .  WRIST SURGERY  2015    Current Outpatient Medications on File Prior to Visit  Medication Sig Dispense Refill  . albuterol (PROVENTIL HFA;VENTOLIN HFA) 108 (90 Base) MCG/ACT inhaler Inhale 2 puffs into the lungs every 6 (six) hours as needed for wheezing or shortness of breath. 1 Inhaler 1  . acetaZOLAMIDE (DIAMOX) 250 MG tablet Take 2 tablets (500 mg total) by mouth 2 (two) times daily. (Patient not taking: Reported on 04/04/2018) 120 tablet 0  . buPROPion (WELLBUTRIN SR) 150 MG 12 hr tablet Take 1 tablet (150 mg total) daily with breakfast by mouth. 30 tablet 1  . famotidine (PEPCID) 20 MG tablet Take 1 tablet (20 mg total) by mouth 2 (two) times daily. (Patient not taking: Reported on 04/04/2018) 30 tablet 0  . Insulin Pen Needle (PEN NEEDLES) 32G X 4 MM MISC 1 each by Does not apply route daily. (Patient not taking: Reported on 04/04/2018) 100 each 1  . liraglutide (VICTOZA) 18 MG/3ML SOPN Inject 0.2 mLs (1.2 mg total) into the skin daily. INJECT 0.2 MLS (1.2 MG TOTAL) INTO THE SKIN DAILY. (Patient not taking: Reported on 04/04/2018) 18 mL 3  . ondansetron (ZOFRAN ODT) 4 MG disintegrating tablet 4mg  ODT q4 hours prn nausea/vomit (Patient not taking: Reported on 04/04/2018) 10 tablet 0   No current facility-administered medications on file prior  to visit.     No Known Allergies  Social History:  reports that she has been smoking cigarettes. She has been smoking about 1.00 pack per day. She has never used smokeless tobacco. She reports that she drinks alcohol. She reports that she does not use drugs.  Family History  Problem Relation Age of Onset  . Heart attack Mother   . Hypertension Mother   . Obesity Mother   . Depression Mother   . Anxiety disorder Mother   . Bipolar disorder Mother   . Cancer Father   . Migraines Neg Hx     The following portions of the patient's history were reviewed and updated as appropriate: allergies, current medications, past family history, past medical  history, past social history, past surgical history and problem list.  Review of Systems Pertinent items noted in HPI and remainder of comprehensive ROS otherwise negative.   Objective:  BP 135/81   Pulse 84   Ht 5\' 6"  (1.676 m)   Wt 255 lb (115.7 kg)   LMP 03/18/2018 (Exact Date)   BMI 41.16 kg/m  CONSTITUTIONAL: Well-developed, well-nourished female in no acute distress.  HENT:  Normocephalic, atraumatic, External right and left ear normal. Oropharynx is clear and moist EYES: Conjunctivae and EOM are normal. Pupils are equal, round, and reactive to light. No scleral icterus.  NECK: Normal range of motion, supple, no masses.  Normal thyroid.  SKIN: Skin is warm and dry. No rash noted. Not diaphoretic. No erythema. No pallor. NEUROLOGIC: Alert and oriented to person, place, and time. Normal reflexes, muscle tone coordination. No cranial nerve deficit noted. PSYCHIATRIC: Normal mood and affect. Normal behavior. Normal judgment and thought content. CARDIOVASCULAR: Normal heart rate noted, regular rhythm RESPIRATORY: Clear to auscultation bilaterally. Effort and breath sounds normal, no problems with respiration noted. BREASTS: Symmetric in size. No masses, skin changes, nipple drainage, or lymphadenopathy. ABDOMEN: Soft, obese, normal bowel sounds, no distention appreciated.  No tenderness, rebound or guarding.  PELVIC: Normal appearing external genitalia; normal appearing vaginal mucosa and cervix.  Normal appearing discharge.  Pap smear obtained.  Unable to palpate uterus or adnexa adequately secondary to habitus, but no abnormalities noted.  MUSCULOSKELETAL: Normal range of motion. No tenderness.  No cyanosis, clubbing, or edema.  2+ distal pulses.    Assessment and Plan:  1. General counseling and advice on female contraception She has never been pregnant. Reviewed all forms of reversible birth control options available including abstinence; fertility period awareness methods; over  the counter/barrier methods; hormonal contraceptive medication including pill, patch, ring, injection,contraceptive implant; hormonal and nonhormonal IUDs.  Questions were answered.  Information was given to patient to review. She desires condomss and will consider fertility period awareness for now.  Emphasized the use of condoms 100% of the time for STI prevention.  2. Well woman exam with routine gynecological exam - Cytology - PAP - Hepatitis B surface antigen - Hepatitis C antibody - RPR - HIV Antibody (routine testing w rflx) Will follow up results of pap smear and other labs and manage accordingly. Routine preventative health maintenance measures emphasized. Please refer to After Visit Summary for other counseling recommendations.    Jaynie Collins, MD, FACOG Obstetrician & Gynecologist, [facility] for [employer], [facility]

## 2018-04-05 LAB — RPR: RPR Ser Ql: NONREACTIVE

## 2018-04-05 LAB — HIV ANTIBODY (ROUTINE TESTING W REFLEX): HIV SCREEN 4TH GENERATION: NONREACTIVE

## 2018-04-05 LAB — HEPATITIS C ANTIBODY: Hep C Virus Ab: <0.1 s/co ratio (ref 0.0–0.9)

## 2018-04-05 LAB — HEPATITIS B SURFACE ANTIGEN: HEP B S AG: NEGATIVE

## 2018-04-09 LAB — CYTOLOGY - PAP
Chlamydia: NEGATIVE
Diagnosis: NEGATIVE
Neisseria Gonorrhea: NEGATIVE

## 2018-06-27 ENCOUNTER — Other Ambulatory Visit: Payer: Self-pay

## 2018-06-27 ENCOUNTER — Emergency Department (HOSPITAL_COMMUNITY)
Admission: EM | Admit: 2018-06-27 | Discharge: 2018-06-28 | Disposition: A | Discharge status: Home / Self Care | Payer: Self-pay | Attending: Emergency Medicine | Admitting: Emergency Medicine

## 2018-06-27 ENCOUNTER — Encounter (HOSPITAL_COMMUNITY): Payer: Self-pay

## 2018-06-27 DIAGNOSIS — E119 Type 2 diabetes mellitus without complications: Secondary | ICD-10-CM | POA: Insufficient documentation

## 2018-06-27 DIAGNOSIS — F17210 Nicotine dependence, cigarettes, uncomplicated: Secondary | ICD-10-CM | POA: Insufficient documentation

## 2018-06-27 DIAGNOSIS — I10 Essential (primary) hypertension: Secondary | ICD-10-CM | POA: Insufficient documentation

## 2018-06-27 DIAGNOSIS — F419 Anxiety disorder, unspecified: Secondary | ICD-10-CM | POA: Insufficient documentation

## 2018-06-27 DIAGNOSIS — Z7984 Long term (current) use of oral hypoglycemic drugs: Secondary | ICD-10-CM | POA: Insufficient documentation

## 2018-06-27 DIAGNOSIS — R197 Diarrhea, unspecified: Secondary | ICD-10-CM | POA: Insufficient documentation

## 2018-06-27 DIAGNOSIS — Z9884 Bariatric surgery status: Secondary | ICD-10-CM | POA: Insufficient documentation

## 2018-06-27 DIAGNOSIS — R112 Nausea with vomiting, unspecified: Secondary | ICD-10-CM | POA: Insufficient documentation

## 2018-06-27 LAB — CBC
HCT: 44.6 % (ref 36.0–46.0)
HEMOGLOBIN: 13.5 g/dL (ref 12.0–15.0)
MCH: 26 pg (ref 26.0–34.0)
MCHC: 30.3 g/dL (ref 30.0–36.0)
MCV: 85.9 fL (ref 80.0–100.0)
Platelets: 223 10*3/uL (ref 150–400)
RBC: 5.19 MIL/uL — ABNORMAL HIGH (ref 3.87–5.11)
RDW: 13.6 % (ref 11.5–15.5)
WBC: 10.2 10*3/uL (ref 4.0–10.5)
nRBC: 0 % (ref 0.0–0.2)

## 2018-06-27 LAB — COMPREHENSIVE METABOLIC PANEL
ALK PHOS: 39 U/L (ref 38–126)
ALT: 9 U/L (ref 0–44)
AST: 11 U/L — AB (ref 15–41)
Albumin: 3.7 g/dL (ref 3.5–5.0)
Anion gap: 8 (ref 5–15)
BUN: 13 mg/dL (ref 6–20)
CALCIUM: 8.6 mg/dL — AB (ref 8.9–10.3)
CHLORIDE: 108 mmol/L (ref 98–111)
CO2: 22 mmol/L (ref 22–32)
CREATININE: 0.63 mg/dL (ref 0.44–1.00)
Glucose, Bld: 90 mg/dL (ref 70–99)
Potassium: 3.7 mmol/L (ref 3.5–5.1)
Sodium: 138 mmol/L (ref 135–145)
Total Bilirubin: 1.5 mg/dL — ABNORMAL HIGH (ref 0.3–1.2)
Total Protein: 6.4 g/dL — ABNORMAL LOW (ref 6.5–8.1)

## 2018-06-27 LAB — LIPASE, BLOOD: LIPASE: 27 U/L (ref 11–51)

## 2018-06-27 LAB — I-STAT BETA HCG BLOOD, ED (MC, WL, AP ONLY): I-stat hCG, quantitative: 10.9 m[IU]/mL — ABNORMAL HIGH (ref <5)

## 2018-06-27 MED ORDER — SODIUM CHLORIDE 0.9% FLUSH
3.0000 mL | Freq: Once | INTRAVENOUS | Status: AC
  Administered 2018-06-28: 3 mL via INTRAVENOUS

## 2018-06-27 NOTE — ED Triage Notes (Addendum)
Pt reports abdominal cramping that started last night. Also endorses back pain. She states that the pain has made her nauseous. She has vomited 2x today and endorses diarrhea. A&Ox4. Pt had gastric sleeve November 2018.

## 2018-06-27 NOTE — ED Notes (Signed)
Urine to lab-patient is currently on menses

## 2018-06-27 NOTE — ED Notes (Signed)
Pt is aware a urine sample is needed, but is unable to provide one at this time. 

## 2018-06-27 NOTE — ED Notes (Signed)
Lab to add-on hcg quant per [employer]

## 2018-06-28 ENCOUNTER — Emergency Department (HOSPITAL_COMMUNITY): Payer: Self-pay

## 2018-06-28 ENCOUNTER — Encounter (HOSPITAL_COMMUNITY): Payer: Self-pay

## 2018-06-28 LAB — URINALYSIS, ROUTINE W REFLEX MICROSCOPIC
Bilirubin Urine: NEGATIVE
Glucose, UA: NEGATIVE mg/dL
Ketones, ur: NEGATIVE mg/dL
Nitrite: NEGATIVE
Protein, ur: 100 mg/dL — AB
Specific Gravity, Urine: 1.029 (ref 1.005–1.030)
pH: 5 (ref 5.0–8.0)

## 2018-06-28 LAB — HCG, QUANTITATIVE, PREGNANCY: hCG, Beta Chain, Quant, S: <1 m[IU]/mL (ref <5)

## 2018-06-28 MED ORDER — SODIUM CHLORIDE (PF) 0.9 % IJ SOLN
INTRAMUSCULAR | Status: AC
  Administered 2018-06-28: 3 mL via INTRAVENOUS
  Filled 2018-06-28: qty 50

## 2018-06-28 MED ORDER — DICYCLOMINE HCL 20 MG PO TABS: 20.0000 mg | ORAL_TABLET | Freq: Two times a day (BID) | ORAL | 0 refills | Status: DC

## 2018-06-28 MED ORDER — METOCLOPRAMIDE HCL 5 MG/ML IJ SOLN
10.0000 mg | Freq: Once | INTRAMUSCULAR | Status: AC
  Administered 2018-06-28: 10 mg via INTRAVENOUS
  Filled 2018-06-28: qty 2

## 2018-06-28 MED ORDER — DICYCLOMINE HCL 10 MG PO CAPS
10.0000 mg | ORAL_CAPSULE | Freq: Once | ORAL | Status: AC
  Administered 2018-06-28: 10 mg via ORAL
  Filled 2018-06-28: qty 1

## 2018-06-28 MED ORDER — METOCLOPRAMIDE HCL 10 MG PO TABS: 10.0000 mg | ORAL_TABLET | Freq: Four times a day (QID) | ORAL | 0 refills | Status: DC | PRN

## 2018-06-28 MED ORDER — ONDANSETRON HCL 4 MG/2ML IJ SOLN
4.0000 mg | Freq: Once | INTRAMUSCULAR | Status: AC
  Administered 2018-06-28: 4 mg via INTRAVENOUS
  Filled 2018-06-28: qty 2

## 2018-06-28 MED ORDER — IOPAMIDOL (ISOVUE-300) INJECTION 61%
INTRAVENOUS | Status: AC
  Filled 2018-06-28: qty 100

## 2018-06-28 MED ORDER — SODIUM CHLORIDE 0.9 % IV BOLUS
1000.0000 mL | Freq: Once | INTRAVENOUS | Status: AC
  Administered 2018-06-28: 1000 mL via INTRAVENOUS

## 2018-06-28 MED ORDER — IOPAMIDOL (ISOVUE-300) INJECTION 61%
100.0000 mL | Freq: Once | INTRAVENOUS | Status: AC | PRN
  Administered 2018-06-28: 100 mL via INTRAVENOUS

## 2018-06-28 NOTE — ED Provider Notes (Signed)
[facility]-EMERGENCY DEPT Provider Note   [name]: [id number] Arrival date & time: 06/27/18  2051     History   Chief Complaint Chief Complaint  Patient presents with  . Abdominal Pain    HPI Cassandra Tucker is a 28 y.o. female.  Who presents the emergency department chief complaint of abdominal pain.  Patient has a past nuchal history of gastric sleeve.  She states that earlier today she began having cramping generalized abdominal pain followed by greater than 10 episodes of watery brown diarrhea, nausea and about 3 episodes of nonbloody nonbilious vomitus.  She has some lower back pain.  She has noticed a little bit of discomfort with urination but denies frank dysuria, foul odor of urine frequency or urgency.  Patient denies recent foreign travel, ingestion of suspicious foods, contacts with similar symptoms.  HPI  Past Medical History:  Diagnosis Date  . Ankle fracture, left 2000  . Anxiety   . Arthritis    left wrist and left ankle  . Back pain   . Bronchitis   . Diabetes (HCC) 01/11/2016   type II   . Family history of adverse reaction to anesthesia    gmother had problems with N/V   . GERD (gastroesophageal reflux disease)   . Headache   . History of kidney stones   . HLD (hyperlipidemia)   . Hypertension 2013  . Intracranial hypertension    psuedotumor cebrum  . Joint pain   . Kienbock's disease   . Leg edema   . OSA (obstructive sleep apnea)    cpap - does not know settings   . Papilledema   . RLS (restless legs syndrome)   . Sinus tachycardia     Patient Active Problem List   Diagnosis Date Noted  . Vitamin D deficiency 02/26/2017  . Class 3 obesity with serious comorbidity and body mass index (BMI) of 60.0 to 69.9 in adult 02/26/2017  . Controlled type 2 diabetes mellitus without complication, without long-term current use of insulin (HCC) 04/21/2016  . Gastroesophageal reflux disease without esophagitis 04/21/2016  . 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 Super obesity 11/29/2015  . OSA on CPAP 11/29/2015  . RLS (restless legs syndrome) 11/29/2015  . Papilledema 06/16/2015  . Pain in the wrist 04/28/2014  . 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 HLD (hyperlipidemia) 01/21/2014  . Sinus tachycardia 12/04/2011  . Essential hypertension 12/04/2011    Past Surgical History:  Procedure Laterality Date  . FRACTURE SURGERY  2005   ankle  . FRACTURE SURGERY  2013   wrist (deteriorating bone)  . LAPAROSCOPIC GASTRIC SLEEVE RESECTION N/A 05/08/2017   Procedure: LAPAROSCOPIC GASTRIC SLEEVE RESECTION;  Surgeon: Berna Bue, MD;  Location: [facility];  Service: General;  Laterality: N/A;  . LAPAROSCOPIC GASTRIC SLEEVE RESECTION  05/08/2017  . UPPER GI ENDOSCOPY  05/08/2017   Procedure: UPPER GI ENDOSCOPY;  Surgeon: Berna Bue, MD;  Location: [facility];  Service: General;;  . WRIST SURGERY  2015     OB History    Gravida  0   Para  0   Term  0   Preterm  0   AB  0   Living  0     SAB  0   TAB  0   Ectopic  0   Multiple  0   Live Births  0            Home Medications    Prior to Admission  medications   Medication Sig Start Date End Date Taking? Authorizing Provider  acetaZOLAMIDE (DIAMOX) 250 MG tablet Take 2 tablets (500 mg total) by mouth 2 (two) times daily. Patient not taking: Reported on 04/04/2018 10/10/17   Dettinger, Elige Radon, MD  albuterol (PROVENTIL HFA;VENTOLIN HFA) 108 (90 Base) MCG/ACT inhaler Inhale 2 puffs into the lungs every 6 (six) hours as needed for wheezing or shortness of breath. Patient not taking: Reported on 06/28/2018 05/26/16   Remus Loffler, PA-C  buPROPion [facility] SR) 150 MG 12 hr tablet Take 1 tablet (150 mg total) daily with breakfast by mouth. Patient not taking: Reported on 06/28/2018 04/16/17 05/16/17  Quillian Quince D, MD  famotidine (PEPCID) 20 MG tablet Take 1 tablet (20 mg total) by mouth 2 (two) times daily. Patient not taking: Reported on  04/04/2018 02/06/18   Dartha Lodge, PA-C  Insulin Pen Needle (PEN NEEDLES) 32G X 4 MM MISC 1 each by Does not apply route daily. Patient not taking: Reported on 04/04/2018 12/04/16   Henrene Pastor, PharmD  liraglutide (VICTOZA) 18 MG/3ML SOPN Inject 0.2 mLs (1.2 mg total) into the skin daily. INJECT 0.2 MLS (1.2 MG TOTAL) INTO THE SKIN DAILY. Patient not taking: Reported on 04/04/2018 08/10/17   Elenora Gamma, MD  ondansetron (ZOFRAN ODT) 4 MG disintegrating tablet 4mg  ODT q4 hours prn nausea/vomit Patient not taking: Reported on 04/04/2018 02/06/18   Dartha Lodge, PA-C    Family History Family History  Problem Relation Age of Onset  . Heart attack Mother   . Hypertension Mother   . Obesity Mother   . Depression Mother   . Anxiety disorder Mother   . Bipolar disorder Mother   . Cancer Father   . Migraines Neg Hx     Social History Social History   Tobacco Use  . Smoking status: Current Every Day Smoker    Packs/day: 1.00    Types: Cigarettes  . Smokeless tobacco: Never Used  Substance Use Topics  . Alcohol use: Yes    Alcohol/week: 0.0 standard drinks    Comment: socially  . Drug use: No     Allergies   Patient has no known allergies.   Review of Systems Review of Systems Ten systems reviewed and are negative for acute change, except as noted in the HPI.   Physical Exam Updated Vital Signs BP (!) 148/97 (BP Location: Left Arm)   Pulse 93   Temp 98.3 F (36.8 C)   Resp 17   SpO2 100%   Physical Exam Vitals signs and nursing note reviewed.  Constitutional:      General: She is not in acute distress.    Appearance: She is well-developed. She is not diaphoretic.  HENT:     Head: Normocephalic and atraumatic.  Eyes:     General: No scleral icterus.    Conjunctiva/sclera: Conjunctivae normal.  Neck:     Musculoskeletal: Normal range of motion.  Cardiovascular:     Rate and Rhythm: Normal rate and regular rhythm.     Heart sounds: Normal heart sounds.  No murmur. No friction rub. No gallop.   Pulmonary:     Effort: Pulmonary effort is normal. No respiratory distress.     Breath sounds: Normal breath sounds.  Abdominal:     General: Bowel sounds are normal. There is no distension.     Palpations: Abdomen is soft. There is no mass.     Tenderness: There is abdominal tenderness in the left upper quadrant.  There is no guarding.  Skin:    General: Skin is warm and dry.  Neurological:     Mental Status: She is alert and oriented to person, place, and time.  Psychiatric:        Behavior: Behavior normal.      ED Treatments / Results  Labs (all labs ordered are listed, but only abnormal results are displayed) Labs Reviewed  COMPREHENSIVE METABOLIC PANEL - Abnormal; Notable for the following components:      Result Value   Calcium 8.6 (*)    Total Protein 6.4 (*)    AST 11 (*)    Total Bilirubin 1.5 (*)    All other components within normal limits  CBC - Abnormal; Notable for the following components:   RBC 5.19 (*)    All other components within normal limits  I-STAT BETA HCG BLOOD, ED ([facility], [facility], [facility] ONLY) - Abnormal; Notable for the following components:   I-stat hCG, quantitative 10.9 (*)    All other components within normal limits  LIPASE, BLOOD  HCG, QUANTITATIVE, PREGNANCY  URINALYSIS, ROUTINE W REFLEX MICROSCOPIC    EKG None  Radiology No results found.  Procedures Procedures (including critical care time)  Medications Ordered in ED Medications  sodium chloride flush (NS) 0.9 % injection 3 mL (has no administration in time range)  sodium chloride 0.9 % bolus 1,000 mL (has no administration in time range)  ondansetron (ZOFRAN) injection 4 mg (has no administration in time range)  dicyclomine (BENTYL) capsule 10 mg (has no administration in time range)     Initial Impression / Assessment and Plan / ED Course  I have reviewed the triage vital signs and the nursing notes.  Pertinent labs & imaging results that  were available during my care of the patient were reviewed by me and considered in my medical decision making (see chart for details).     28 year old history of gastric sleeve.  Labs without significant abnormality.  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  On examination the patient did have tenderness in the left upper quadrant however her CT scan is negative for any diverticulitis or other acute intra-abdominal pathology.  The patient's urine appears contaminated.  She began menstruation today.  She is not having urinary symptoms.  Patient appears appropriate for discharge at this time her nausea is resolved and she is tolerating p.o. fluids  Final Clinical Impressions(s) / ED Diagnoses   Final diagnoses:  Nausea vomiting and diarrhea    ED Discharge Orders    None       Arthor CaptainHarris, Darron Stuck, PA-C 06/28/18 0704    Paula LibraMolpus, John, MD 06/28/18 [phone]

## 2018-06-28 NOTE — ED Notes (Signed)
PO challenge successful.

## 2018-06-28 NOTE — ED Notes (Signed)
ED PA at bedside

## 2018-06-28 NOTE — ED Notes (Signed)
Pain left lower abdomen and left back area-medicated with Bentyl and Zofran for nausea

## 2018-06-28 NOTE — Discharge Instructions (Signed)
Get help right away if: °You have chest pain. °You feel extremely weak or you faint. °You see blood in your vomit. °Your vomit looks like coffee grounds. °You have bloody or black stools or stools that look like tar. °You have a severe headache, a stiff neck, or both. °You have a rash. °You have severe pain, cramping, or bloating in your abdomen. °You have trouble breathing or you are breathing very quickly. °Your heart is beating very quickly. °Your skin feels cold and clammy. °You feel confused. °You have pain when you urinate. °You have signs of dehydration, such as: °Dark urine, very little urine, or no urine. °Cracked lips. °Dry mouth. °Sunken eyes. °Sleepiness. °Weakness. °

## 2018-12-02 DIAGNOSIS — Z3201 Encounter for pregnancy test, result positive: Secondary | ICD-10-CM | POA: Diagnosis not present

## 2018-12-02 DIAGNOSIS — Z3009 Encounter for other general counseling and advice on contraception: Secondary | ICD-10-CM | POA: Diagnosis not present

## 2018-12-25 ENCOUNTER — Encounter: Payer: Medicaid Other | Admitting: Advanced Practice Midwife

## 2018-12-25 ENCOUNTER — Telehealth: Payer: Self-pay | Admitting: Advanced Practice Midwife

## 2018-12-25 DIAGNOSIS — Z124 Encounter for screening for malignant neoplasm of cervix: Secondary | ICD-10-CM | POA: Diagnosis not present

## 2018-12-25 DIAGNOSIS — Z113 Encounter for screening for infections with a predominantly sexual mode of transmission: Secondary | ICD-10-CM | POA: Diagnosis not present

## 2018-12-25 DIAGNOSIS — Z3490 Encounter for supervision of normal pregnancy, unspecified, unspecified trimester: Secondary | ICD-10-CM | POA: Insufficient documentation

## 2018-12-25 DIAGNOSIS — O26841 Uterine size-date discrepancy, first trimester: Secondary | ICD-10-CM | POA: Diagnosis not present

## 2018-12-25 DIAGNOSIS — N764 Abscess of vulva: Secondary | ICD-10-CM | POA: Diagnosis not present

## 2018-12-25 DIAGNOSIS — Z3480 Encounter for supervision of other normal pregnancy, unspecified trimester: Secondary | ICD-10-CM | POA: Diagnosis not present

## 2018-12-25 NOTE — Telephone Encounter (Signed)
Patient was a no-show for today's New OB appt  Mallie Snooks, MSN, CNM Certified Nurse Midwife, [facility] [employer], [facility] 12/25/18 2:15 PM

## 2018-12-25 NOTE — Progress Notes (Deleted)
LAST PAP 04/04/2018-NORMAL

## 2019-01-13 DIAGNOSIS — Z113 Encounter for screening for infections with a predominantly sexual mode of transmission: Secondary | ICD-10-CM | POA: Diagnosis not present

## 2019-01-13 DIAGNOSIS — Z9884 Bariatric surgery status: Secondary | ICD-10-CM

## 2019-01-13 DIAGNOSIS — O3680X9 Pregnancy with inconclusive fetal viability, other fetus: Secondary | ICD-10-CM | POA: Diagnosis not present

## 2019-01-13 DIAGNOSIS — N898 Other specified noninflammatory disorders of vagina: Secondary | ICD-10-CM | POA: Diagnosis not present

## 2019-01-13 DIAGNOSIS — N925 Other specified irregular menstruation: Secondary | ICD-10-CM | POA: Diagnosis not present

## 2019-01-13 DIAGNOSIS — Z3A11 11 weeks gestation of pregnancy: Secondary | ICD-10-CM | POA: Diagnosis not present

## 2019-01-13 DIAGNOSIS — A6000 Herpesviral infection of urogenital system, unspecified: Secondary | ICD-10-CM | POA: Insufficient documentation

## 2019-01-13 LAB — OB RESULTS CONSOLE GC/CHLAMYDIA
Chlamydia: NEGATIVE
Gonorrhea: NEGATIVE

## 2019-01-22 ENCOUNTER — Encounter (HOSPITAL_COMMUNITY): Payer: Self-pay

## 2019-01-26 DIAGNOSIS — F419 Anxiety disorder, unspecified: Secondary | ICD-10-CM | POA: Diagnosis present

## 2019-01-28 DIAGNOSIS — O3680X9 Pregnancy with inconclusive fetal viability, other fetus: Secondary | ICD-10-CM | POA: Diagnosis not present

## 2019-01-28 DIAGNOSIS — Z3482 Encounter for supervision of other normal pregnancy, second trimester: Secondary | ICD-10-CM | POA: Diagnosis not present

## 2019-01-28 DIAGNOSIS — Z3A12 12 weeks gestation of pregnancy: Secondary | ICD-10-CM | POA: Diagnosis not present

## 2019-01-28 DIAGNOSIS — Z8659 Personal history of other mental and behavioral disorders: Secondary | ICD-10-CM | POA: Insufficient documentation

## 2019-01-28 DIAGNOSIS — Z3401 Encounter for supervision of normal first pregnancy, first trimester: Secondary | ICD-10-CM | POA: Diagnosis not present

## 2019-01-28 DIAGNOSIS — Z1321 Encounter for screening for nutritional disorder: Secondary | ICD-10-CM | POA: Diagnosis not present

## 2019-01-28 DIAGNOSIS — O99210 Obesity complicating pregnancy, unspecified trimester: Secondary | ICD-10-CM | POA: Insufficient documentation

## 2019-01-28 LAB — OB RESULTS CONSOLE HEPATITIS B SURFACE ANTIGEN: Hepatitis B Surface Ag: NEGATIVE

## 2019-01-28 LAB — OB RESULTS CONSOLE HIV ANTIBODY (ROUTINE TESTING): HIV: NONREACTIVE

## 2019-01-28 LAB — OB RESULTS CONSOLE RUBELLA ANTIBODY, IGM: Rubella: IMMUNE

## 2019-02-25 DIAGNOSIS — O3680X9 Pregnancy with inconclusive fetal viability, other fetus: Secondary | ICD-10-CM | POA: Diagnosis not present

## 2019-02-25 DIAGNOSIS — Z9884 Bariatric surgery status: Secondary | ICD-10-CM | POA: Diagnosis not present

## 2019-02-25 DIAGNOSIS — Z3A16 16 weeks gestation of pregnancy: Secondary | ICD-10-CM | POA: Diagnosis not present

## 2019-02-25 DIAGNOSIS — O99212 Obesity complicating pregnancy, second trimester: Secondary | ICD-10-CM | POA: Diagnosis not present

## 2019-03-20 DIAGNOSIS — B9620 Unspecified Escherichia coli [E. coli] as the cause of diseases classified elsewhere: Secondary | ICD-10-CM | POA: Diagnosis not present

## 2019-03-20 DIAGNOSIS — J45909 Unspecified asthma, uncomplicated: Secondary | ICD-10-CM | POA: Diagnosis not present

## 2019-03-20 DIAGNOSIS — Z23 Encounter for immunization: Secondary | ICD-10-CM | POA: Diagnosis not present

## 2019-03-20 DIAGNOSIS — Z3A20 20 weeks gestation of pregnancy: Secondary | ICD-10-CM | POA: Diagnosis not present

## 2019-03-20 DIAGNOSIS — Z363 Encounter for antenatal screening for malformations: Secondary | ICD-10-CM | POA: Diagnosis not present

## 2019-03-20 DIAGNOSIS — Z3492 Encounter for supervision of normal pregnancy, unspecified, second trimester: Secondary | ICD-10-CM | POA: Diagnosis not present

## 2019-05-15 ENCOUNTER — Encounter (HOSPITAL_COMMUNITY): Payer: Self-pay | Admitting: *Deleted

## 2019-05-15 ENCOUNTER — Other Ambulatory Visit: Payer: Self-pay

## 2019-05-15 ENCOUNTER — Inpatient Hospital Stay (HOSPITAL_COMMUNITY)
Admission: AD | Admit: 2019-05-15 | Discharge: 2019-05-16 | Disposition: A | Discharge status: Home / Self Care | Payer: Medicaid Other | Source: Ambulatory Visit | Attending: Obstetrics and Gynecology | Admitting: Obstetrics and Gynecology

## 2019-05-15 DIAGNOSIS — J4541 Moderate persistent asthma with (acute) exacerbation: Secondary | ICD-10-CM

## 2019-05-15 DIAGNOSIS — O163 Unspecified maternal hypertension, third trimester: Secondary | ICD-10-CM | POA: Diagnosis not present

## 2019-05-15 DIAGNOSIS — E119 Type 2 diabetes mellitus without complications: Secondary | ICD-10-CM | POA: Diagnosis not present

## 2019-05-15 DIAGNOSIS — O99613 Diseases of the digestive system complicating pregnancy, third trimester: Secondary | ICD-10-CM | POA: Diagnosis not present

## 2019-05-15 DIAGNOSIS — Z3A28 28 weeks gestation of pregnancy: Secondary | ICD-10-CM | POA: Insufficient documentation

## 2019-05-15 DIAGNOSIS — Z20828 Contact with and (suspected) exposure to other viral communicable diseases: Secondary | ICD-10-CM | POA: Diagnosis not present

## 2019-05-15 DIAGNOSIS — J45909 Unspecified asthma, uncomplicated: Secondary | ICD-10-CM | POA: Diagnosis not present

## 2019-05-15 DIAGNOSIS — K219 Gastro-esophageal reflux disease without esophagitis: Secondary | ICD-10-CM | POA: Insufficient documentation

## 2019-05-15 DIAGNOSIS — O26893 Other specified pregnancy related conditions, third trimester: Secondary | ICD-10-CM | POA: Insufficient documentation

## 2019-05-15 DIAGNOSIS — G2581 Restless legs syndrome: Secondary | ICD-10-CM | POA: Insufficient documentation

## 2019-05-15 DIAGNOSIS — R05 Cough: Secondary | ICD-10-CM | POA: Insufficient documentation

## 2019-05-15 DIAGNOSIS — O99283 Endocrine, nutritional and metabolic diseases complicating pregnancy, third trimester: Secondary | ICD-10-CM | POA: Diagnosis not present

## 2019-05-15 DIAGNOSIS — O99343 Other mental disorders complicating pregnancy, third trimester: Secondary | ICD-10-CM | POA: Diagnosis not present

## 2019-05-15 DIAGNOSIS — M9903 Segmental and somatic dysfunction of lumbar region: Secondary | ICD-10-CM | POA: Diagnosis not present

## 2019-05-15 DIAGNOSIS — E785 Hyperlipidemia, unspecified: Secondary | ICD-10-CM | POA: Diagnosis not present

## 2019-05-15 DIAGNOSIS — O24113 Pre-existing diabetes mellitus, type 2, in pregnancy, third trimester: Secondary | ICD-10-CM | POA: Insufficient documentation

## 2019-05-15 DIAGNOSIS — M1990 Unspecified osteoarthritis, unspecified site: Secondary | ICD-10-CM | POA: Insufficient documentation

## 2019-05-15 DIAGNOSIS — M545 Low back pain: Secondary | ICD-10-CM | POA: Diagnosis not present

## 2019-05-15 DIAGNOSIS — F419 Anxiety disorder, unspecified: Secondary | ICD-10-CM | POA: Insufficient documentation

## 2019-05-15 DIAGNOSIS — G4733 Obstructive sleep apnea (adult) (pediatric): Secondary | ICD-10-CM | POA: Diagnosis not present

## 2019-05-15 DIAGNOSIS — O99333 Smoking (tobacco) complicating pregnancy, third trimester: Secondary | ICD-10-CM | POA: Insufficient documentation

## 2019-05-15 DIAGNOSIS — F17210 Nicotine dependence, cigarettes, uncomplicated: Secondary | ICD-10-CM | POA: Insufficient documentation

## 2019-05-15 DIAGNOSIS — Z79899 Other long term (current) drug therapy: Secondary | ICD-10-CM | POA: Diagnosis not present

## 2019-05-15 DIAGNOSIS — M9904 Segmental and somatic dysfunction of sacral region: Secondary | ICD-10-CM | POA: Diagnosis not present

## 2019-05-15 DIAGNOSIS — O99513 Diseases of the respiratory system complicating pregnancy, third trimester: Secondary | ICD-10-CM | POA: Insufficient documentation

## 2019-05-15 DIAGNOSIS — Z794 Long term (current) use of insulin: Secondary | ICD-10-CM | POA: Insufficient documentation

## 2019-05-15 DIAGNOSIS — R059 Cough, unspecified: Secondary | ICD-10-CM

## 2019-05-15 DIAGNOSIS — R0602 Shortness of breath: Secondary | ICD-10-CM | POA: Insufficient documentation

## 2019-05-15 DIAGNOSIS — M9905 Segmental and somatic dysfunction of pelvic region: Secondary | ICD-10-CM | POA: Diagnosis not present

## 2019-05-15 LAB — CBC
HCT: 35.1 % — ABNORMAL LOW (ref 36.0–46.0)
Hemoglobin: 11.5 g/dL — ABNORMAL LOW (ref 12.0–15.0)
MCH: 27.8 pg (ref 26.0–34.0)
MCHC: 32.8 g/dL (ref 30.0–36.0)
MCV: 84.8 fL (ref 80.0–100.0)
Platelets: 226 10*3/uL (ref 150–400)
RBC: 4.14 MIL/uL (ref 3.87–5.11)
RDW: 13.2 % (ref 11.5–15.5)
WBC: 10.9 10*3/uL — ABNORMAL HIGH (ref 4.0–10.5)
nRBC: 0 % (ref 0.0–0.2)

## 2019-05-15 MED ORDER — BUDESONIDE 180 MCG/ACT IN AEPB
2.0000 | INHALATION_SPRAY | Freq: Two times a day (BID) | RESPIRATORY_TRACT | Status: DC
  Administered 2019-05-16: 2 via RESPIRATORY_TRACT
  Filled 2019-05-15: qty 1

## 2019-05-15 NOTE — MAU Note (Signed)
OB diagnosed me with asthma 5mos ago. Given inhaler and helped somewhat. Since Thanksgiving asthma worse, esp with activity. Sleeping is worse. Audible wheezing in triage. Sometimes coughs up clear sputum. Doing a lot of talking makes me out of breath too. No pregnancy complaints. Reports good FM.

## 2019-05-15 NOTE — MAU Provider Note (Signed)
History     [name]: [id number]  Arrival date and time: 05/15/19 2210   First Provider Initiated Contact with Patient 05/15/19 2245      Chief Complaint  Patient presents with  . Shortness of Breath  . Cough   Cassandra Tucker is a 28 y.o. G1P0 at [redacted]w[redacted]d who receives care at [facility]  She presents today for Shortness of Breath and Cough. She reports that she has been having issues with breathing for the past 2 months.  She reports that she was given an inhaler and "it was working Print[employer]."  However, after Thanksgiving, patient states that she has been unresponsive to the inhaler.  Patient further reports that she sleeps sitting up at night. She states she experiences SOB despite her activity. She states she has used her inhaler "more than she can count" today. Patient reports that she has been having a cough for the past week or two as well as congestion.  She denies sick individuals in the household.      OB History    Gravida  1   Para  0   Term  0   Preterm  0   AB  0   Living  0     SAB  0   TAB  0   Ectopic  0   Multiple  0   Live Births  0           Past Medical History:  Diagnosis Date  . Ankle fracture, left 2000  . Anxiety   . Arthritis    left wrist and left ankle  . Back pain   . Bronchitis   . Diabetes ([place]) 01/11/2016   type II   . Family history of adverse reaction to anesthesia    gmother had problems with N/V   . GERD (gastroesophageal reflux disease)   . Headache   . History of kidney stones   . HLD (hyperlipidemia)   . Hypertension 2013  . Intracranial hypertension    psuedotumor cebrum  . Joint pain   . Kienbock's disease   . Leg edema   . OSA (obstructive sleep apnea)    cpap - does not know settings   . Papilledema   . RLS (restless legs syndrome)   . Sinus tachycardia     Past Surgical History:  Procedure Laterality Date  . FRACTURE SURGERY  2005   ankle  . FRACTURE SURGERY  2013   wrist (deteriorating bone)  .  LAPAROSCOPIC GASTRIC SLEEVE RESECTION N/A 05/08/2017   Procedure: LAPAROSCOPIC GASTRIC SLEEVE RESECTION;  Surgeon: Clovis Riley, MD;  Location: [facility];  Service: General;  Laterality: N/A;  . LAPAROSCOPIC GASTRIC SLEEVE RESECTION  05/08/2017  . UPPER GI ENDOSCOPY  05/08/2017   Procedure: UPPER GI ENDOSCOPY;  Surgeon: Clovis Riley, MD;  Location: [facility];  Service: General;;  . WRIST SURGERY  2015    Family History  Problem Relation Age of Onset  . Heart attack Mother   . Hypertension Mother   . Obesity Mother   . Depression Mother   . Anxiety disorder Mother   . Bipolar disorder Mother   . Cancer Father   . Migraines Neg Hx     Social History   Tobacco Use  . Smoking status: Current Every Day Smoker    Packs/day: 1.00    Types: Cigarettes  . Smokeless tobacco: Never Used  Substance Use Topics  . Alcohol use: Yes    Alcohol/week: 0.0 standard  drinks    Comment: socially  . Drug use: No    Allergies: No Known Allergies  Medications Prior to Admission  Medication Sig Dispense Refill Last Dose  . acetaZOLAMIDE (DIAMOX) 250 MG tablet Take 2 tablets (500 mg total) by mouth 2 (two) times daily. (Patient not taking: Reported on 04/04/2018) 120 tablet 0   . albuterol (PROVENTIL HFA;VENTOLIN HFA) 108 (90 Base) MCG/ACT inhaler Inhale 2 puffs into the lungs every 6 (six) hours as needed for wheezing or shortness of breath. (Patient not taking: Reported on 06/28/2018) 1 Inhaler 1   . buPROPion (WELLBUTRIN SR) 150 MG 12 hr tablet Take 1 tablet (150 mg total) daily with breakfast by mouth. (Patient not taking: Reported on 06/28/2018) 30 tablet 1   . dicyclomine (BENTYL) 20 MG tablet Take 1 tablet (20 mg total) by mouth 2 (two) times daily. 20 tablet 0   . famotidine (PEPCID) 20 MG tablet Take 1 tablet (20 mg total) by mouth 2 (two) times daily. (Patient not taking: Reported on 04/04/2018) 30 tablet 0   . Insulin Pen Needle (PEN NEEDLES) 32G X 4 MM MISC 1 each by Does not apply  route daily. (Patient not taking: Reported on 04/04/2018) 100 each 1   . liraglutide (VICTOZA) 18 MG/3ML SOPN Inject 0.2 mLs (1.2 mg total) into the skin daily. INJECT 0.2 MLS (1.2 MG TOTAL) INTO THE SKIN DAILY. (Patient not taking: Reported on 04/04/2018) 18 mL 3   . metoCLOPramide (REGLAN) 10 MG tablet Take 1 tablet (10 mg total) by mouth every 6 (six) hours as needed for nausea (nausea/headache). 6 tablet 0   . ondansetron (ZOFRAN ODT) 4 MG disintegrating tablet  ODT q4 hours prn nausea/vomit (Patient not taking: Reported on 04/04/2018) 10 tablet 0     Review of Systems  Constitutional: Negative for chills and fever.  HENT: Positive for congestion, sinus pressure, sinus pain and sneezing. Negative for sore throat.   Respiratory: Positive for cough (Productive), chest tightness ("When really short winded" but none currently. ) and shortness of breath.   Cardiovascular: Negative for chest pain.  Gastrointestinal: Negative for abdominal pain, nausea and vomiting.  Genitourinary: Negative for difficulty urinating, dysuria, vaginal bleeding and vaginal discharge.  Musculoskeletal: Negative for joint swelling, myalgias, neck pain and neck stiffness.  Neurological: Negative for dizziness, light-headedness and headaches.   Physical Exam   Blood pressure (!) 163/82, pulse 88, temperature 97.8 F (36.6 C), resp. rate 20, height  (1.702 m), weight 126.6 kg, last menstrual period 06/27/2018, SpO2 100 %.  Vitals:   05/15/19 2303 05/15/19 2326 05/15/19 2352 05/16/19 0053  BP: (!) 157/86 (!) 153/83 (!) 144/80 (!) 148/87  Pulse: 90 95 91 100  Resp:    20  Temp:      SpO2:    98%  Weight:      Height:        Physical Exam  Constitutional: She is oriented to person, place, and time.  Obese  HENT:  Head: Normocephalic and atraumatic.  Eyes: Conjunctivae are normal.  Neck: Normal range of motion.  Cardiovascular: Normal rate and normal heart sounds.  Respiratory: Effort normal. No  respiratory distress. She has wheezes in the right upper field, the right middle field, the left upper field and the left middle field. She exhibits no tenderness.  GI: Soft.  Musculoskeletal: Normal range of motion.  Neurological: She is alert and oriented to person, place, and time.  Skin: Skin is warm and dry.  Psychiatric: She has  a normal mood and affect. Her behavior is normal.    MAU Course  Procedures Results for orders placed or performed during the hospital encounter of 05/15/19 (from the past 24 hour(s))  CBC     Status: Abnormal   Collection Time: 05/15/19 10:51 PM  Result Value Ref Range   WBC 10.9 (H) 4.0 - 10.5 K/uL   RBC 4.14 3.87 - 5.11 MIL/uL   Hemoglobin 11.5 (L) 12.0 - 15.0 g/dL   HCT 16.135.1 (L) 09.636.0 - 04.546.0 %   MCV 84.8 80.0 - 100.0 fL   MCH 27.8 26.0 - 34.0 pg   MCHC 32.8 30.0 - 36.0 g/dL   RDW 40.913.2 81.111.5 - 91.415.5 %   Platelets 226 150 - 400 K/uL   nRBC 0.0 0.0 - 0.2 %  Comprehensive metabolic panel     Status: Abnormal   Collection Time: 05/15/19 10:51 PM  Result Value Ref Range   Sodium 136 135 - 145 mmol/L   Potassium 4.1 3.5 - 5.1 mmol/L   Chloride 107 98 - 111 mmol/L   CO2 21 (L) 22 - 32 mmol/L   Glucose, Bld 80 70 - 99 mg/dL   BUN <5 (L) 6 - 20 mg/dL   Creatinine, Ser 7.820.40 (L) 0.44 - 1.00 mg/dL   Calcium 9.2 8.9 - 95.610.3 mg/dL   Total Protein 6.2 (L) 6.5 - 8.1 g/dL   Albumin 3.0 (L) 3.5 - 5.0 g/dL   AST 10 (L) 15 - 41 U/L   ALT 8 0 - 44 U/L   Alkaline Phosphatase 62 38 - 126 U/L   Total Bilirubin 0.6 0.3 - 1.2 mg/dL   GFR calc non Af Amer >60 >60 mL/min   GFR calc Af Amer >60 >60 mL/min   Anion gap 8 5 - 15  Protein / creatinine ratio, urine     Status: None   Collection Time: 05/15/19 11:04 PM  Result Value Ref Range   Creatinine, Urine 120.25 mg/dL   Total Protein, Urine 12 mg/dL   Protein Creatinine Ratio 0.10 0.00 - 0.15 mg/mg[Cre]   Doppler: 144  MDM Physical Exam Labs: CBC, CMP, PC Ratio Measure BPQ15 min Assessment and Plan  28 year  old G1P0 SIUP at 28.6 weeks Cough SOB Asthma  -Patient informed that her symptoms would have to be evaluated in the ER as we do not perform breathing treatments in the MAU. -Patient declines transfer stating that she would not like to be exposed to potential Covid patients and does not desire to wait.   -Patient states she will leave and "try to wait til Tuesday" to go to the doctor. -She further states she may go to urgent care near her house, but first needs to find out the costs. -Discussed need for PIH work-up for elevated blood pressure.  Patient informed that elevations most likely due to albuterol usage, but further evaluation is necessary. -Patient informed that she would remain in triage, but blood pressures will be monitored. -Lab collected while provider in room.   -Will await results.   Cherre RobinsJessica L Mikell Kazlauskas, MSN, CNM 05/15/2019, 10:45 PM   Reassessment (11:28 PM) -Will try budesonide inhaler since unable to give nebulizer treatment. -Patient informed of plan of care.  Questions safety during pregnancy. -Reviewed that steroid inhaler is safe during pregnancy and is preferred over uncontrolled asthma. -Also discussed need for patient to get a COVID [facility] which opens at 10am. -Discussed that patient should not go to work until results return.  -  Patient states that she is in a room by herself while working and is therefore not exposing others to symptoms. -Informed that provider will give her an excuse for OOW until next OB visit. -Order placed for budesonide.   Reassessment (12:27 AM)  -PreEclampsia labs return within normal limits.  -Budesonide inhaler has not yet arrived. -Pharmacy called and medication is en route. -Will perform Covid swab now and contact patient with results via mychart. -Patient updated on POC. -Instructed to stay out of work tomorrow for rest. OOW excuse given for RTW on Monday Dec 7th. -Patient without questions or  concerns. -Encouraged to call or return to MAU if symptoms worsen or with the onset of new symptoms. -Discharged to home in stable condition.   Cherre Robins MSN, CNM Advanced Practice Provider, [facility] [employer]

## 2019-05-16 LAB — COMPREHENSIVE METABOLIC PANEL
ALT: 8 U/L (ref 0–44)
AST: 10 U/L — ABNORMAL LOW (ref 15–41)
Albumin: 3 g/dL — ABNORMAL LOW (ref 3.5–5.0)
Alkaline Phosphatase: 62 U/L (ref 38–126)
Anion gap: 8 (ref 5–15)
BUN: <5 mg/dL — ABNORMAL LOW (ref 6–20)
CO2: 21 mmol/L — ABNORMAL LOW (ref 22–32)
Calcium: 9.2 mg/dL (ref 8.9–10.3)
Chloride: 107 mmol/L (ref 98–111)
Creatinine, Ser: 0.4 mg/dL — ABNORMAL LOW (ref 0.44–1.00)
GFR calc Af Amer: >60 mL/min (ref >60)
GFR calc non Af Amer: >60 mL/min (ref >60)
Glucose, Bld: 80 mg/dL (ref 70–99)
Potassium: 4.1 mmol/L (ref 3.5–5.1)
Sodium: 136 mmol/L (ref 135–145)
Total Bilirubin: 0.6 mg/dL (ref 0.3–1.2)
Total Protein: 6.2 g/dL — ABNORMAL LOW (ref 6.5–8.1)

## 2019-05-16 LAB — SARS CORONAVIRUS 2 (TAT 6-24 HRS): SARS Coronavirus 2: NEGATIVE

## 2019-05-16 LAB — PROTEIN / CREATININE RATIO, URINE
Creatinine, Urine: 120.25 mg/dL
Protein Creatinine Ratio: 0.1 mg/mg{Cre} (ref 0.00–0.15)
Total Protein, Urine: 12 mg/dL

## 2019-05-16 NOTE — Discharge Instructions (Signed)
Asthma, Adult  Asthma is a long-term (chronic) condition that causes recurrent episodes in which the airways become tight and narrow. The airways are the passages that lead from the nose and mouth down into the lungs. Asthma episodes, also called asthma attacks, can cause coughing, wheezing, shortness of breath, and chest pain. The airways can also fill with mucus. During an attack, it can be difficult to breathe. Asthma attacks can range from minor to life threatening. Asthma cannot be cured, but medicines and lifestyle changes can help control it and treat acute attacks. What are the causes? This condition is believed to be caused by inherited (genetic) and environmental factors, but its exact cause is not known. There are many things that can bring on an asthma attack or make asthma symptoms worse (triggers). Asthma triggers are different for each person. Common triggers include:  Mold.  Dust.  Cigarette smoke.  Cockroaches.  Things that can cause allergy symptoms (allergens), such as animal dander or pollen from trees or grass.  Air pollutants such as household cleaners, wood smoke, smog, or chemical odors.  Cold air, weather changes, and winds (which increase molds and pollen in the air).  Strong emotional expressions such as crying or laughing hard.  Stress.  Certain medicines (such as aspirin) or types of medicines (such as beta-blockers).  Sulfites in foods and drinks. Foods and drinks that may contain sulfites include dried fruit, potato chips, and sparkling grape juice.  Infections or inflammatory conditions such as the flu, a cold, or inflammation of the nasal membranes (rhinitis).  Gastroesophageal reflux disease (GERD).  Exercise or strenuous activity. What are the signs or symptoms? Symptoms of this condition may occur right after asthma is triggered or many hours later. Symptoms include:  Wheezing. This can sound like whistling when you breathe.  Excessive  nighttime or early morning coughing.  Frequent or severe coughing with a common cold.  Chest tightness.  Shortness of breath.  Tiredness (fatigue) with minimal activity. How is this diagnosed? This condition is diagnosed based on:  Your medical history.  A physical exam.  Tests, which may include: ? Lung function studies and pulmonary studies (spirometry). These tests can evaluate the flow of air in your lungs. ? Allergy tests. ? Imaging tests, such as X-rays. How is this treated? There is no cure for this condition, but treatment can help control your symptoms. Treatment for asthma usually involves:  Identifying and avoiding your asthma triggers.  Using medicines to control your symptoms. Generally, two types of medicines are used to treat asthma: ? Controller medicines. These help prevent asthma symptoms from occurring. They are usually taken every day. ? Fast-acting reliever or rescue medicines. These quickly relieve asthma symptoms by widening the narrow and tight airways. They are used as needed and provide short-term relief.  Using supplemental oxygen. This may be needed during a severe episode.  Using other medicines, such as: ? Allergy medicines, such as antihistamines, if your asthma attacks are triggered by allergens. ? Immune medicines (immunomodulators). These are medicines that help control the immune system.  Creating an asthma action plan. An asthma action plan is a written plan for managing and treating your asthma attacks. This plan includes: ? A list of your asthma triggers and how to avoid them. ? Information about when medicines should be taken and when their dosage should be changed. ? Instructions about using a device called a peak flow meter. A peak flow meter measures how well the lungs are working and the   severity of your asthma. It helps you monitor your condition. Follow these instructions at home: Controlling your home environment Control your home  environment in the following ways to help avoid triggers and prevent asthma attacks:  Change your heating and air conditioning filter regularly.  Limit your use of fireplaces and wood stoves.  Get rid of pests (such as roaches and mice) and their droppings.  Throw away plants if you see mold on them.  Clean floors and dust surfaces regularly. Use unscented cleaning products.  Try to have someone else vacuum for you regularly. Stay out of rooms while they are being vacuumed and for a short while afterward. If you vacuum, use a dust mask from a hardware store, a double-layered or microfilter vacuum cleaner bag, or a vacuum cleaner with a HEPA filter.  Replace carpet with wood, tile, or vinyl flooring. Carpet can trap dander and dust.  Use allergy-proof pillows, mattress covers, and box spring covers.  Keep your bedroom a trigger-free room.  Avoid pets and keep windows closed when allergens are in the air.  Wash beddings every week in hot water and dry them in a dryer.  Use blankets that are made of polyester or cotton.  Clean bathrooms and kitchens with bleach. If possible, have someone repaint the walls in these rooms with mold-resistant paint. Stay out of the rooms that are being cleaned and painted.  Wash your hands often with soap and water. If soap and water are not available, use hand sanitizer.  Do not allow anyone to smoke in your home. General instructions  Take over-the-counter and prescription medicines only as told by your health care provider. ? Speak with your health care provider if you have questions about how or when to take the medicines. ? Make note if you are requiring more frequent dosages.  Do not use any products that contain nicotine or tobacco, such as cigarettes and e-cigarettes. If you need help quitting, ask your health care provider. Also, avoid being exposed to secondhand smoke.  Use a peak flow meter as told by your health care provider. Record and  keep track of the readings.  Understand and use the asthma action plan to help minimize, or stop an asthma attack, without needing to seek medical care.  Make sure you stay up to date on your yearly vaccinations as told by your health care provider. This may include vaccines for the flu and pneumonia.  Avoid outdoor activities when allergen counts are high and when air quality is low.  Wear a ski mask that covers your nose and mouth during outdoor winter activities. Exercise indoors on cold days if you can.  Warm up before exercising, and take time for a cool-down period after exercise.  Keep all follow-up visits as told by your health care provider. This is important. Where to find more information  For information about asthma, turn to the Centers for Disease Control and Prevention at www.cdc.gov/asthma/faqs.htm  For air quality information, turn to AirNow at https://airnow.gov/ Contact a health care provider if:  You have wheezing, shortness of breath, or a cough even while you are taking medicine to prevent attacks.  The mucus you cough up (sputum) is thicker than usual.  Your sputum changes from clear or white to yellow, green, gray, or bloody.  Your medicines are causing side effects, such as a rash, itching, swelling, or trouble breathing.  You need to use a reliever medicine more than 2-3 times a week.  Your peak flow reading   is still at 50-79% of your personal best after following your action plan for 1 hour.  You have a fever. Get help right away if:  You are getting worse and do not respond to treatment during an asthma attack.  You are short of breath when at rest or when doing very little physical activity.  You have difficulty eating, drinking, or talking.  You have chest pain or tightness.  You develop a fast heartbeat or palpitations.  You have a bluish color to your lips or fingernails.  You are light-headed or dizzy, or you faint.  Your peak flow  reading is less than 50% of your personal best.  You feel too tired to breathe normally. Summary  Asthma is a long-term (chronic) condition that causes recurrent episodes in which the airways become tight and narrow. These episodes can cause coughing, wheezing, shortness of breath, and chest pain.  Asthma cannot be cured, but medicines and lifestyle changes can help control it and treat acute attacks.  Make sure you understand how to avoid triggers and how and when to use your medicines.  Asthma attacks can range from minor to life threatening. Get help right away if you have an asthma attack and do not respond to treatment with your usual rescue medicines. This information is not intended to replace advice given to you by your health care provider. Make sure you discuss any questions you have with your health care provider. Document Released: 05/29/2005 Document Revised: 08/01/2018 Document Reviewed: 07/03/2016 Elsevier Patient Education  2020 Elsevier Inc.  

## 2019-05-16 NOTE — Progress Notes (Signed)
Jessica EMly CNM in earlier to discuss meds and d/c plan. Written and verbal d/c instructions given and understanding voiced. Covid swab obtained without difficulty and pt tol well.

## 2019-05-18 DIAGNOSIS — J45909 Unspecified asthma, uncomplicated: Secondary | ICD-10-CM | POA: Insufficient documentation

## 2019-05-20 DIAGNOSIS — O43129 Velamentous insertion of umbilical cord, unspecified trimester: Secondary | ICD-10-CM | POA: Diagnosis not present

## 2019-05-20 DIAGNOSIS — Z3A28 28 weeks gestation of pregnancy: Secondary | ICD-10-CM | POA: Diagnosis not present

## 2019-05-21 DIAGNOSIS — D649 Anemia, unspecified: Secondary | ICD-10-CM | POA: Diagnosis present

## 2019-05-23 ENCOUNTER — Other Ambulatory Visit: Payer: Self-pay

## 2019-06-02 ENCOUNTER — Other Ambulatory Visit (HOSPITAL_COMMUNITY): Payer: Self-pay | Admitting: Obstetrics and Gynecology

## 2019-06-02 DIAGNOSIS — Z364 Encounter for antenatal screening for fetal growth retardation: Secondary | ICD-10-CM

## 2019-06-02 DIAGNOSIS — Z3493 Encounter for supervision of normal pregnancy, unspecified, third trimester: Secondary | ICD-10-CM | POA: Diagnosis not present

## 2019-06-02 DIAGNOSIS — R81 Glycosuria: Secondary | ICD-10-CM | POA: Diagnosis not present

## 2019-06-02 DIAGNOSIS — O99213 Obesity complicating pregnancy, third trimester: Secondary | ICD-10-CM | POA: Diagnosis not present

## 2019-06-02 DIAGNOSIS — L292 Pruritus vulvae: Secondary | ICD-10-CM | POA: Diagnosis not present

## 2019-06-02 DIAGNOSIS — F419 Anxiety disorder, unspecified: Secondary | ICD-10-CM | POA: Diagnosis not present

## 2019-06-02 DIAGNOSIS — Z3A30 30 weeks gestation of pregnancy: Secondary | ICD-10-CM | POA: Diagnosis not present

## 2019-06-11 ENCOUNTER — Other Ambulatory Visit (HOSPITAL_COMMUNITY): Payer: Medicaid Other

## 2019-06-12 DIAGNOSIS — Z3A32 32 weeks gestation of pregnancy: Secondary | ICD-10-CM | POA: Diagnosis not present

## 2019-06-12 DIAGNOSIS — O99213 Obesity complicating pregnancy, third trimester: Secondary | ICD-10-CM | POA: Diagnosis not present

## 2019-06-16 DIAGNOSIS — O99213 Obesity complicating pregnancy, third trimester: Secondary | ICD-10-CM | POA: Diagnosis not present

## 2019-06-19 ENCOUNTER — Ambulatory Visit (HOSPITAL_COMMUNITY): Payer: Medicaid Other

## 2019-06-19 ENCOUNTER — Encounter (HOSPITAL_COMMUNITY): Payer: Self-pay

## 2019-06-19 DIAGNOSIS — Z3A33 33 weeks gestation of pregnancy: Secondary | ICD-10-CM | POA: Diagnosis not present

## 2019-06-19 DIAGNOSIS — O99213 Obesity complicating pregnancy, third trimester: Secondary | ICD-10-CM | POA: Diagnosis not present

## 2019-06-19 DIAGNOSIS — O43129 Velamentous insertion of umbilical cord, unspecified trimester: Secondary | ICD-10-CM | POA: Diagnosis not present

## 2019-06-19 DIAGNOSIS — Z331 Pregnant state, incidental: Secondary | ICD-10-CM | POA: Diagnosis not present

## 2019-06-23 DIAGNOSIS — O99213 Obesity complicating pregnancy, third trimester: Secondary | ICD-10-CM | POA: Diagnosis not present

## 2019-06-26 DIAGNOSIS — O139 Gestational [pregnancy-induced] hypertension without significant proteinuria, unspecified trimester: Secondary | ICD-10-CM | POA: Diagnosis not present

## 2019-06-26 DIAGNOSIS — Z3A34 34 weeks gestation of pregnancy: Secondary | ICD-10-CM | POA: Diagnosis not present

## 2019-06-26 DIAGNOSIS — O99213 Obesity complicating pregnancy, third trimester: Secondary | ICD-10-CM | POA: Diagnosis not present

## 2019-06-26 DIAGNOSIS — Z3493 Encounter for supervision of normal pregnancy, unspecified, third trimester: Secondary | ICD-10-CM | POA: Diagnosis not present

## 2019-06-27 DIAGNOSIS — G932 Benign intracranial hypertension: Secondary | ICD-10-CM | POA: Diagnosis present

## 2019-06-27 DIAGNOSIS — Z23 Encounter for immunization: Secondary | ICD-10-CM | POA: Diagnosis not present

## 2019-06-28 ENCOUNTER — Inpatient Hospital Stay (HOSPITAL_COMMUNITY)
Admission: AD | Admit: 2019-06-28 | Discharge: 2019-06-28 | Disposition: A | Discharge status: Home / Self Care | Payer: Medicaid Other | Attending: Obstetrics & Gynecology | Admitting: Obstetrics & Gynecology

## 2019-06-28 ENCOUNTER — Other Ambulatory Visit: Payer: Self-pay

## 2019-06-28 DIAGNOSIS — O133 Gestational [pregnancy-induced] hypertension without significant proteinuria, third trimester: Secondary | ICD-10-CM | POA: Insufficient documentation

## 2019-06-28 DIAGNOSIS — Z3A35 35 weeks gestation of pregnancy: Secondary | ICD-10-CM | POA: Insufficient documentation

## 2019-06-28 MED ORDER — BETAMETHASONE SOD PHOS & ACET 6 (3-3) MG/ML IJ SUSP
12.0000 mg | Freq: Once | INTRAMUSCULAR | Status: AC
  Administered 2019-06-28: 12 mg via INTRAMUSCULAR
  Filled 2019-06-28: qty 5

## 2019-06-28 NOTE — MAU Provider Note (Signed)
Patient Cassandra Tucker is a 29 y.o. G1P0000 at [redacted]w[redacted]d here for follow up BMZ. She denies HA, blurry vision, floating spots, RUQ pain or other signs of pre-e. She has "essential hypertension" in her past medical history, and elevated BPs have been noted on ED visits outside of pregnancy. It is possible that she is a chronic hypertensive, although is managed as gestational hypertensive by Cassandra Tucker provider.  Patient Vitals for the past 24 hrs:  BP Temp Temp src Pulse Resp SpO2  06/28/19 1114 (!) 159/91 -- -- 92 -- --  06/28/19 1101 (!) 157/96 97.9 F (36.6 C) Oral 98 16 99 %   She is a known gestational hypertensive; she will be scheduled for IOL at 37 weeks. She was seen at [facility] by Cassandra Tucker on 1/14 (two days ago). BP that day was 140/90. Repeat BP check was 130/90 on 1/15.   Spoke with Cassandra Tucker CNM who reviewed labs that were drawn on 1/14. Per Cassandra Tucker, CBC, CMP and PCR were normal, did not meet criteria for pre-eclampsia. Patient has had elevated blood pressures up to 150s/90s,  She is scheduled for twice weekly testing in the office; her next appointment is 1/18. Patient understands strict pre-e precautions and when to return to MAU. She will have another BP check tomorrow when she has her second dose of BMZ.   Cassandra Tucker 06/28/2019, 11:19 AM

## 2019-06-28 NOTE — MAU Note (Signed)
Cassandra Tucker is a 30 y.o. at [redacted]w[redacted]d here in MAU reporting: here for 1st dose of BMZ due to [facility], denies pain, bleeding, and LOF. +FM  Pain score: 0/10  Vitals:   06/28/19 1101  BP: (!) 157/96  Pulse: 98  Resp: 16  Temp: 97.9 F (36.6 C)  SpO2: 99%     FHT: 143  Lab orders placed from triage: none

## 2019-06-28 NOTE — MAU Note (Signed)
Pt to return tomorrow for 2nd dose of BMZ. Strict pre-e precautions given.

## 2019-06-29 ENCOUNTER — Other Ambulatory Visit: Payer: Self-pay

## 2019-06-29 ENCOUNTER — Encounter (HOSPITAL_COMMUNITY): Payer: Self-pay | Admitting: Obstetrics and Gynecology

## 2019-06-29 ENCOUNTER — Inpatient Hospital Stay (HOSPITAL_COMMUNITY)
Admission: AD | Admit: 2019-06-29 | Discharge: 2019-07-01 | DRG: 832 | Disposition: A | Discharge status: Home / Self Care | Payer: Medicaid Other | Attending: Obstetrics and Gynecology | Admitting: Obstetrics and Gynecology

## 2019-06-29 DIAGNOSIS — O43199 Other malformation of placenta, unspecified trimester: Secondary | ICD-10-CM

## 2019-06-29 DIAGNOSIS — Z3A34 34 weeks gestation of pregnancy: Secondary | ICD-10-CM | POA: Diagnosis not present

## 2019-06-29 DIAGNOSIS — O10919 Unspecified pre-existing hypertension complicating pregnancy, unspecified trimester: Secondary | ICD-10-CM | POA: Diagnosis present

## 2019-06-29 DIAGNOSIS — O1493 Unspecified pre-eclampsia, third trimester: Secondary | ICD-10-CM | POA: Diagnosis not present

## 2019-06-29 DIAGNOSIS — O99353 Diseases of the nervous system complicating pregnancy, third trimester: Secondary | ICD-10-CM | POA: Diagnosis present

## 2019-06-29 DIAGNOSIS — O99333 Smoking (tobacco) complicating pregnancy, third trimester: Secondary | ICD-10-CM | POA: Diagnosis present

## 2019-06-29 DIAGNOSIS — O98313 Other infections with a predominantly sexual mode of transmission complicating pregnancy, third trimester: Secondary | ICD-10-CM | POA: Diagnosis present

## 2019-06-29 DIAGNOSIS — Z3490 Encounter for supervision of normal pregnancy, unspecified, unspecified trimester: Secondary | ICD-10-CM

## 2019-06-29 DIAGNOSIS — A6000 Herpesviral infection of urogenital system, unspecified: Secondary | ICD-10-CM | POA: Diagnosis present

## 2019-06-29 DIAGNOSIS — G932 Benign intracranial hypertension: Secondary | ICD-10-CM | POA: Diagnosis present

## 2019-06-29 DIAGNOSIS — Z20822 Contact with and (suspected) exposure to covid-19: Secondary | ICD-10-CM | POA: Diagnosis present

## 2019-06-29 DIAGNOSIS — F17210 Nicotine dependence, cigarettes, uncomplicated: Secondary | ICD-10-CM | POA: Diagnosis present

## 2019-06-29 DIAGNOSIS — O1490 Unspecified pre-eclampsia, unspecified trimester: Secondary | ICD-10-CM | POA: Diagnosis present

## 2019-06-29 DIAGNOSIS — O10013 Pre-existing essential hypertension complicating pregnancy, third trimester: Principal | ICD-10-CM | POA: Diagnosis present

## 2019-06-29 DIAGNOSIS — O99013 Anemia complicating pregnancy, third trimester: Secondary | ICD-10-CM | POA: Diagnosis present

## 2019-06-29 DIAGNOSIS — D649 Anemia, unspecified: Secondary | ICD-10-CM | POA: Diagnosis present

## 2019-06-29 DIAGNOSIS — Z6841 Body Mass Index (BMI) 40.0 and over, adult: Secondary | ICD-10-CM

## 2019-06-29 HISTORY — DX: Herpesviral infection, unspecified: B00.9

## 2019-06-29 LAB — CBC
HCT: 34.2 % — ABNORMAL LOW (ref 36.0–46.0)
Hemoglobin: 10.7 g/dL — ABNORMAL LOW (ref 12.0–15.0)
MCH: 25.8 pg — ABNORMAL LOW (ref 26.0–34.0)
MCHC: 31.3 g/dL (ref 30.0–36.0)
MCV: 82.6 fL (ref 80.0–100.0)
Platelets: 254 10*3/uL (ref 150–400)
RBC: 4.14 MIL/uL (ref 3.87–5.11)
RDW: 13.3 % (ref 11.5–15.5)
WBC: 13.5 10*3/uL — ABNORMAL HIGH (ref 4.0–10.5)
nRBC: 0 % (ref 0.0–0.2)

## 2019-06-29 LAB — COMPREHENSIVE METABOLIC PANEL
ALT: 10 U/L (ref 0–44)
AST: 9 U/L — ABNORMAL LOW (ref 15–41)
Albumin: 2.7 g/dL — ABNORMAL LOW (ref 3.5–5.0)
Alkaline Phosphatase: 75 U/L (ref 38–126)
Anion gap: 11 (ref 5–15)
BUN: 6 mg/dL (ref 6–20)
CO2: 19 mmol/L — ABNORMAL LOW (ref 22–32)
Calcium: 9.2 mg/dL (ref 8.9–10.3)
Chloride: 106 mmol/L (ref 98–111)
Creatinine, Ser: 0.42 mg/dL — ABNORMAL LOW (ref 0.44–1.00)
GFR calc Af Amer: >60 mL/min (ref >60)
GFR calc non Af Amer: >60 mL/min (ref >60)
Glucose, Bld: 105 mg/dL — ABNORMAL HIGH (ref 70–99)
Potassium: 3.8 mmol/L (ref 3.5–5.1)
Sodium: 136 mmol/L (ref 135–145)
Total Bilirubin: 0.6 mg/dL (ref 0.3–1.2)
Total Protein: 6.1 g/dL — ABNORMAL LOW (ref 6.5–8.1)

## 2019-06-29 LAB — PROTEIN / CREATININE RATIO, URINE
Creatinine, Urine: 75.53 mg/dL
Protein Creatinine Ratio: 0.23 mg/mg{Cre} — ABNORMAL HIGH (ref 0.00–0.15)
Total Protein, Urine: 17 mg/dL

## 2019-06-29 LAB — ABO/RH: ABO/RH(D): O POS

## 2019-06-29 LAB — SARS CORONAVIRUS 2 (TAT 6-24 HRS): SARS Coronavirus 2: NEGATIVE

## 2019-06-29 LAB — TYPE AND SCREEN
ABO/RH(D): O POS
Antibody Screen: NEGATIVE

## 2019-06-29 MED ORDER — PRENATAL MULTIVITAMIN CH
1.0000 | ORAL_TABLET | Freq: Every day | ORAL | Status: DC
  Administered 2019-06-29 – 2019-07-01 (×3): 1 via ORAL
  Filled 2019-06-29 (×3): qty 1

## 2019-06-29 MED ORDER — DICYCLOMINE HCL 20 MG PO TABS
20.0000 mg | ORAL_TABLET | Freq: Two times a day (BID) | ORAL | Status: DC
  Administered 2019-06-29 – 2019-07-01 (×5): 20 mg via ORAL
  Filled 2019-06-29 (×6): qty 1

## 2019-06-29 MED ORDER — VALACYCLOVIR HCL 500 MG PO TABS
1000.0000 mg | ORAL_TABLET | Freq: Every day | ORAL | Status: DC
  Administered 2019-06-29 – 2019-07-01 (×3): 1000 mg via ORAL
  Filled 2019-06-29 (×3): qty 2

## 2019-06-29 MED ORDER — LABETALOL HCL 5 MG/ML IV SOLN: 80.0000 mg | INTRAVENOUS | Status: DC | PRN

## 2019-06-29 MED ORDER — LABETALOL HCL 5 MG/ML IV SOLN: 40.0000 mg | INTRAVENOUS | Status: DC | PRN

## 2019-06-29 MED ORDER — ALBUTEROL SULFATE (2.5 MG/3ML) 0.083% IN NEBU: 3.0000 mL | INHALATION_SOLUTION | Freq: Four times a day (QID) | RESPIRATORY_TRACT | Status: DC | PRN

## 2019-06-29 MED ORDER — NIFEDIPINE ER OSMOTIC RELEASE 30 MG PO TB24
30.0000 mg | ORAL_TABLET | Freq: Every day | ORAL | Status: DC
  Administered 2019-06-29 – 2019-07-01 (×3): 30 mg via ORAL
  Filled 2019-06-29 (×3): qty 1

## 2019-06-29 MED ORDER — POLYSACCHARIDE IRON COMPLEX 150 MG PO CAPS
150.0000 mg | ORAL_CAPSULE | Freq: Every day | ORAL | Status: DC
  Administered 2019-06-30 – 2019-07-01 (×2): 150 mg via ORAL
  Filled 2019-06-29 (×2): qty 1

## 2019-06-29 MED ORDER — BETAMETHASONE SOD PHOS & ACET 6 (3-3) MG/ML IJ SUSP
12.0000 mg | Freq: Once | INTRAMUSCULAR | Status: AC
  Administered 2019-06-29: 10:00:00 12 mg via INTRAMUSCULAR

## 2019-06-29 MED ORDER — HYDRALAZINE HCL 20 MG/ML IJ SOLN: 10.0000 mg | INTRAMUSCULAR | Status: DC | PRN

## 2019-06-29 MED ORDER — DOCUSATE SODIUM 100 MG PO CAPS
100.0000 mg | ORAL_CAPSULE | Freq: Every day | ORAL | Status: DC
  Administered 2019-06-30 – 2019-07-01 (×2): 100 mg via ORAL
  Filled 2019-06-29 (×2): qty 1

## 2019-06-29 MED ORDER — ACETAMINOPHEN 325 MG PO TABS: 650.0000 mg | ORAL_TABLET | ORAL | Status: DC | PRN

## 2019-06-29 MED ORDER — CALCIUM CARBONATE ANTACID 500 MG PO CHEW
2.0000 | CHEWABLE_TABLET | ORAL | Status: DC | PRN
  Administered 2019-06-30: 400 mg via ORAL
  Filled 2019-06-29: qty 2

## 2019-06-29 MED ORDER — LABETALOL HCL 5 MG/ML IV SOLN
20.0000 mg | INTRAVENOUS | Status: DC | PRN
  Administered 2019-06-29: 11:00:00 20 mg via INTRAVENOUS
  Filled 2019-06-29: qty 4

## 2019-06-29 MED ORDER — ZOLPIDEM TARTRATE 5 MG PO TABS
5.0000 mg | ORAL_TABLET | Freq: Every evening | ORAL | Status: DC | PRN
  Administered 2019-06-30: 5 mg via ORAL
  Filled 2019-06-29: qty 1

## 2019-06-29 NOTE — MAU Note (Signed)
Pt requesting privacy in triage to process recommendation to stay in MAU for evaluation of BP. Call light given to patient to use when she is ready for RN to come back.

## 2019-06-29 NOTE — MAU Provider Note (Signed)
History     [name]: [id number]  Arrival date and time: 06/29/19 0936   None     Chief Complaint  Patient presents with  . Injections   HPI Cassandra Tucker is a 29 y.o. G1P0000 at [redacted]w[redacted]d who presents to MAU for second Betamethasone injection and blood pressure check. She receives prenatal care with [facility]. She is managed as a Gestational Hypertension patient but has "eccential hypertension on her PMH. She denies headache, visual disturbances, RUQ/epigastric pain, new onset swelling or weight gain.  Patient denies history of severe range blood pressure and endorses normal PEC labs during her office visit 06/26/2019. Her current plan of care is IOL at 37 weeks and her next clinic appointment is tomorrow 06/30/2019.  OB History    Gravida  1   Para  0   Term  0   Preterm  0   AB  0   Living  0     SAB  0   TAB  0   Ectopic  0   Multiple  0   Live Births  0           Past Medical History:  Diagnosis Date  . Ankle fracture, left 2000  . Anxiety   . Arthritis    left wrist and left ankle  . Back pain   . Bronchitis   . Diabetes (HCC) 01/11/2016   type II   . Family history of adverse reaction to anesthesia    gmother had problems with N/V   . GERD (gastroesophageal reflux disease)   . Headache   . History of kidney stones   . HLD (hyperlipidemia)   . Hypertension 2013  . Intracranial hypertension    psuedotumor cebrum  . Joint pain   . Kienbock's disease   . Leg edema   . OSA (obstructive sleep apnea)    cpap - does not know settings   . Papilledema   . RLS (restless legs syndrome)   . Sinus tachycardia     Past Surgical History:  Procedure Laterality Date  . FRACTURE SURGERY  2005   ankle  . FRACTURE SURGERY  2013   wrist (deteriorating bone)  . LAPAROSCOPIC GASTRIC SLEEVE RESECTION N/A 05/08/2017   Procedure: LAPAROSCOPIC GASTRIC SLEEVE RESECTION;  Surgeon: Berna Bue, MD;  Location: [facility];  Service: General;  Laterality: N/A;  .  LAPAROSCOPIC GASTRIC SLEEVE RESECTION  05/08/2017  . UPPER GI ENDOSCOPY  05/08/2017   Procedure: UPPER GI ENDOSCOPY;  Surgeon: Berna Bue, MD;  Location: [facility];  Service: General;;  . WRIST SURGERY  2015    Family History  Problem Relation Age of Onset  . Heart attack Mother   . Hypertension Mother   . Obesity Mother   . Depression Mother   . Anxiety disorder Mother   . Bipolar disorder Mother   . Cancer Father   . Migraines Neg Hx     Social History   Tobacco Use  . Smoking status: Current Every Day Smoker    Packs/day: 1.00    Types: Cigarettes  . Smokeless tobacco: Never Used  Substance Use Topics  . Alcohol use: Yes    Alcohol/week: 0.0 standard drinks    Comment: socially  . Drug use: No    Allergies: No Known Allergies  Medications Prior to Admission  Medication Sig Dispense Refill Last Dose  . albuterol (PROVENTIL HFA;VENTOLIN HFA) 108 (90 Base) MCG/ACT inhaler Inhale 2 puffs into the lungs every 6 (six)  hours as needed for wheezing or shortness of breath. (Patient not taking: Reported on 06/28/2018) 1 Inhaler 1   . dicyclomine (BENTYL) 20 MG tablet Take 1 tablet (20 mg total) by mouth 2 (two) times daily. 20 tablet 0     Review of Systems  Eyes: Negative for photophobia.  Respiratory: Negative for shortness of breath.   Gastrointestinal: Negative for abdominal pain.  Genitourinary: Negative for vaginal bleeding.  Musculoskeletal: Negative for back pain.  Neurological: Negative for dizziness, syncope, weakness and headaches.  All other systems reviewed and are negative.  Physical Exam   Blood pressure (!) 150/105, pulse (!) 111, temperature 97.9 F (36.6 C), temperature source Oral, resp. rate 17, last menstrual period 06/27/2018, SpO2 100 %.  Physical Exam  Nursing note and vitals reviewed. Constitutional: She is oriented to person, place, and time. She appears well-developed and well-nourished.  Cardiovascular: Normal rate.  Respiratory:  Effort normal and breath sounds normal.  GI: Soft. She exhibits no distension. There is no abdominal tenderness. There is no rebound and no guarding.  Genitourinary:    Genitourinary Comments: Cervical exam deferred   Neurological: She is alert and oriented to person, place, and time.  Skin: Skin is warm and dry.  Psychiatric: She has a normal mood and affect. Her behavior is normal. Judgment and thought content normal.    MAU Course/MDM  Procedures  --Severe range pressure x 2 in triage in close proximity, [facility] Protocol ordered and labs advised. Patient hesitant to be roomed and receive workup, requested I call Dr. Mancel Bale. Dr. Mancel Bale called at [place] per patient request, agreed with my recommendation to collect PEC labs and monitor blood pressures. Patient amenable to workup.  --Normotensive after IV Labetalol 20mg  --No severe symptoms reported by patient. Normal PEC labs --Reactive tracing, baseline 130, mod variability, pos accels, no decels --Toco: occasional contractions not felt by patient --Workup results discussed with Drs Roselie Awkward and Mancel Bale.   Patient Vitals for the past 24 hrs:  BP Temp Temp src Pulse Resp SpO2  06/29/19 1200 (!) 145/98 -- -- (!) 104 -- --  06/29/19 1145 125/86 -- -- (!) 111 -- --  06/29/19 1132 140/76 -- -- 90 -- --  06/29/19 1117 (!) 160/92 -- -- (!) 101 -- --  06/29/19 1102 (!) 155/105 -- -- (!) 117 -- --  06/29/19 1047 (!) 158/97 -- -- (!) 113 -- --  06/29/19 1032 (!) 152/95 -- -- (!) 113 -- --  06/29/19 1022 (!) 150/105 -- -- (!) 111 -- --  06/29/19 0954 (!) 170/97 -- -- -- -- --  06/29/19 0950 (!) 184/102 97.9 F (36.6 C) Oral 89 17 100 %   Results for orders placed or performed during the hospital encounter of 06/29/19 (from the past 24 hour(s))  CBC     Status: Abnormal   Collection Time: 06/29/19 10:33 AM  Result Value Ref Range   WBC 13.5 (H) 4.0 - 10.5 K/uL   RBC 4.14 3.87 - 5.11 MIL/uL   Hemoglobin 10.7 (L) 12.0 - 15.0 g/dL   HCT 34.2 (L)  36.0 - 46.0 %   MCV 82.6 80.0 - 100.0 fL   MCH 25.8 (L) 26.0 - 34.0 pg   MCHC 31.3 30.0 - 36.0 g/dL   RDW 13.3 11.5 - 15.5 %   Platelets 254 150 - 400 K/uL   nRBC 0.0 0.0 - 0.2 %  Comprehensive metabolic panel     Status: Abnormal   Collection Time: 06/29/19 10:33 AM  Result Value  Ref Range   Sodium 136 135 - 145 mmol/L   Potassium 3.8 3.5 - 5.1 mmol/L   Chloride 106 98 - 111 mmol/L   CO2 19 (L) 22 - 32 mmol/L   Glucose, Bld 105 (H) 70 - 99 mg/dL   BUN 6 6 - 20 mg/dL   Creatinine, Ser 8.29 (L) 0.44 - 1.00 mg/dL   Calcium 9.2 8.9 - 93.7 mg/dL   Total Protein 6.1 (L) 6.5 - 8.1 g/dL   Albumin 2.7 (L) 3.5 - 5.0 g/dL   AST 9 (L) 15 - 41 U/L   ALT 10 0 - 44 U/L   Alkaline Phosphatase 75 38 - 126 U/L   Total Bilirubin 0.6 0.3 - 1.2 mg/dL   GFR calc non Af Amer >60 >60 mL/min   GFR calc Af Amer >60 >60 mL/min   Anion gap 11 5 - 15  Protein / creatinine ratio, urine     Status: Abnormal   Collection Time: 06/29/19 10:46 AM  Result Value Ref Range   Creatinine, Urine 75.53 mg/dL   Total Protein, Urine 17 mg/dL   Protein Creatinine Ratio 0.23 (H) 0.00 - 0.15 mg/mg[Cre]   Assessment and Plan  --29 y.o. G1P0000 at [redacted]w[redacted]d  --Reactive tracing --New onset severe range blood pressures, no other severe symptoms --Normal PEC labs --S/p BMZ 01/16 and 01/17 --Per Dr. Su Hilt, admit to [facility], plan to redraw labs and consult MFM tomorrow 06/30/2019  Calvert Cantor, CNM 06/29/2019, 12:08 PM

## 2019-06-29 NOTE — MAU Note (Signed)
Cassandra Tucker is a 29 y.o. at [redacted]w[redacted]d here in MAU reporting: here for 2nd BMZ. Denies contractions, vag bleeding, and LOF. Denies headache, visual changes, and RUQ. +FM  Pain score: 0/10  Vitals:   06/29/19 0950  BP: (!) 184/102  Pulse: 89  Resp: 17  Temp: 97.9 F (36.6 C)  SpO2: 100%     FHT: 144  Lab orders placed from triage: none

## 2019-06-29 NOTE — H&P (Signed)
OB ADMISSION/ HISTORY & PHYSICAL:  Admission Date: 06/29/2019  9:36 AM  Admit Diagnosis: Preeclampisa    Cassandra Tucker is a 29 y.o. female presenting for second BMZ injection for gest HTN noted in office this week. During MAU visit she was noted to have severe range BP, received IV labetalol and improved to mild range.  Denies neural sx, reports hx HA but they are typical for her d/t hx pseudotumor cerebry, neuro consult in the past, pending new consult.   Prenatal History: G1P0000   EDC : 08/06/2019, by Other Basis  Prenatal care at [facility] since 12 weeks.   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 - Hx E-coli UTI, TOC neg - hx benign intracranial HTN, neuro consult pending - anxiety/depression stable off meds - anemia, hgb 10.8, oral Fe rx'ed - gest HTN started at 32 wks, PEC labs wnl 06/26/2019, neg PCR creat 0.46, AST/ALT 8/5 BMZ series completed 1/16, 1/17  Prenatal Labs: ABO, Rh:   O pos Antibody:  neg Rubella:   immune RPR:   neg HBsAg:   neg HIV:   neg GBS:   pending 1 hr Glucola : 122 Genetic Screening: normal Panorama Ultrasound: normal anatomy, MCI    Maternal Diabetes: No Genetic Screening: Normal Maternal Ultrasounds/Referrals: Normal, MCI Fetal Ultrasounds or other Referrals:  None Maternal Substance Abuse:  No Significant Maternal Medications:  None Significant Maternal Lab Results:  None Other Comments:  BMZ course completed  Medical / Surgical History :  Past medical history:  Past Medical History:  Diagnosis Date  . Ankle fracture, left 2000  . Anxiety   . Arthritis    left wrist and left ankle  . Back pain   . Bronchitis   . Diabetes (HCC) 01/11/2016   type II   . Family history of adverse reaction to anesthesia    gmother had problems with N/V   . GERD (gastroesophageal reflux disease)   . Headache   . History  of kidney stones   . HLD (hyperlipidemia)   . HSV-2 infection   . Hypertension 2013  . Intracranial hypertension    psuedotumor cebrum  . Joint pain   . Kienbock's disease   . Leg edema   . OSA (obstructive sleep apnea)    cpap - does not know settings   . Papilledema   . RLS (restless legs syndrome)   . Sinus tachycardia      Past surgical history:  Past Surgical History:  Procedure Laterality Date  . FRACTURE SURGERY  2005   ankle  . FRACTURE SURGERY  2013   wrist (deteriorating bone)  . LAPAROSCOPIC GASTRIC SLEEVE RESECTION N/A 05/08/2017   Procedure: LAPAROSCOPIC GASTRIC SLEEVE RESECTION;  Surgeon: Berna Bue, MD;  Location: [facility];  Service: General;  Laterality: N/A;  . LAPAROSCOPIC GASTRIC SLEEVE RESECTION  05/08/2017  . UPPER GI ENDOSCOPY  05/08/2017   Procedure: UPPER GI ENDOSCOPY;  Surgeon: Berna Bue, MD;  Location: [facility];  Service: General;;  . WRIST SURGERY  2015     Family History:  Family History  Problem Relation Age of Onset  . Heart attack Mother   . Hypertension Mother   . Obesity Mother   . Depression Mother   . Anxiety disorder Mother   . Bipolar disorder Mother   . Cancer Father   . Migraines  Neg Hx      Social History:  reports that she has been smoking cigarettes. She has been smoking about 1.00 pack per day. She has never used smokeless tobacco. She reports current alcohol use. She reports current drug use. Drug: Marijuana.   Allergies: Patient has no known allergies.   Current Medications at time of admission:  Medications Prior to Admission  Medication Sig Dispense Refill Last Dose  . albuterol (PROVENTIL HFA;VENTOLIN HFA) 108 (90 Base) MCG/ACT inhaler Inhale 2 puffs into the lungs every 6 (six) hours as needed for wheezing or shortness of breath. (Patient not taking: Reported on 06/28/2018) 1 Inhaler 1   . dicyclomine (BENTYL) 20 MG tablet Take 1 tablet (20 mg total) by mouth 2 (two) times daily. 20 tablet 0       Review of Systems: ROS No HA/NV/RUQ pain/visual changes. No LOF or VB, denies feeling ctx, + FM  Physical Exam: Vital signs and nursing notes reviewed. Date/Time  Temp  Pulse  ECG Heart Rate  Resp  BP  Patient Position (if appropriate)  SpO2  O2 Device  O2 Flow Rate (L/min)  FiO2 (%)  Weight Who   06/29/19 1241  97.8 F (36.6 C)  102Abnormal   -  18  142/99Abnormal   Sitting  97 %  Room Air  -  -  - CR   06/29/19 1226  -  -  -  -  -  -  98 %  -  -  -  - CR   06/29/19 1226  -  -  -  -  -  -  -  -  -  -  130.6 kg CRA   06/29/19 1221  -  -  -  -  -  -  99 %  -  -  -  - CR   06/29/19 1216  -  -  -  -  -  -  98 %  -  -  -  - CR   06/29/19 1215  -  102Abnormal   -  -  146/92Abnormal   -  -  -  -  -  - CRA   06/29/19 1211  -  -  -  -  -  -  97 %  -  -  -  - CR   06/29/19 1206  -  -  -  -  -  -  97 %  -  -  -  - CR   06/29/19 1201  -  -  -  -  -  -  97 %  -  -  -  - CR   06/29/19 1200  -  104Abnormal   -  -  145/98Abnormal   -  -  -  -  -  - CRA   06/29/19 1156  -  -  -  -  -  -  97 %  -  -  -  - CR   06/29/19 1151  -  -  -  -  -  -  97 %  -  -  -  - CR   06/29/19 1146  -  -  -  -  -  -  98 %  -  -  -  - CR   06/29/19 1145  -  111Abnormal   -  -  125/86  -  -  -  -  -  - CRA  06/29/19 1141  -  -  -  -  -  -  97 %  -  -  -  - CR   06/29/19 1136  -  -  -  -  -  -  97 %  -  -  -  - CR   06/29/19 1132  -  90  -  -  140/76  -  -  -  -  -  - CRA   06/29/19 1131  -  -  -  -  -  -  98 %  -  -  -  - CR   06/29/19 1126  -  -  -  -  -  -  99 %  -  -  -  - CR   06/29/19 1121  -  -  -  -  -  -  98 %  -  -  -  - CR   06/29/19 1117  -  101Abnormal   -  -  160/92Abnormal   -  -  -  -  -  - CRA   06/29/19 1116  -  -  -  -  -  -  99 %  -  -  -  - CR   06/29/19 1111  -  -  -  -  -  -  98 %  -  -  -  - CR   06/29/19 1106  -  -  -  -  -  -  99 %  -  -  -  - CR   06/29/19 1102  -  117Abnormal   -  -  155/105Abnormal   -  -  -  -  -  - CRA   06/29/19 1101  -  -  -  -  -  -  99 %  -  -  -  - CR   06/29/19  1047  -  113Abnormal   -  -  158/97Abnormal   -  -  -  -  -  - CRA   06/29/19 1032  -  113Abnormal   -  -  152/95Abnormal   -  -  -  -  -  - CRA   06/29/19 1022  -  111Abnormal   -  -  150/105Abnormal   -  -  -  -  -  - CRA   06/29/19 0954  -  -  -  -  170/97Abnormal   -  -  -  -  -  - ND   06/29/19 0950  97.9 F (36.6 C)  89  -  17  184/102Abnormal                   General: AAO x 3, NAD Heart: RRR Lungs:CTAB Abdomen: Gravid, NT, obese Extremities: trace pedal edema Neuro: neg clonus bilat Genitalia / VE:  deferred   FHR: 140 BPM, mod variability, + accels, no decels TOCO: Ctx q 2-3 mild, patient does not feel them  Labs:    Recent Labs    06/29/19 1033  WBC 13.5*  HGB 10.7*  HCT 34.2*  PLT 254   CMP Latest Ref Rng & Units 06/29/2019 05/15/2019 06/27/2018  Glucose 70 - 99 mg/dL 932(I) 80 90  BUN 6 - 20 mg/dL 6 <7(T) 13  Creatinine 0.44 - 1.00 mg/dL 2.45(Y) 0.99(I) 3.38  Sodium 135 - 145 mmol/L  136 136 138  Potassium 3.5 - 5.1 mmol/L 3.8 4.1 3.7  Chloride 98 - 111 mmol/L 106 107 108  CO2 22 - 32 mmol/L 19(L) 21(L) 22  Calcium 8.9 - 10.3 mg/dL 9.2 9.2 6.9(G)  Total Protein 6.5 - 8.1 g/dL 6.1(L) 6.2(L) 6.4(L)  Total Bilirubin 0.3 - 1.2 mg/dL 0.6 0.6 2.9(B)  Alkaline Phos 38 - 126 U/L 75 62 39  AST 15 - 41 U/L 9(L) 10(L) 11(L)  ALT 0 - 44 U/L 10 8 9    PCR 0.23     Assessment/Plan  28 y.o. 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

## 2019-06-30 ENCOUNTER — Observation Stay (HOSPITAL_COMMUNITY): Payer: Medicaid Other

## 2019-06-30 DIAGNOSIS — O99333 Smoking (tobacco) complicating pregnancy, third trimester: Secondary | ICD-10-CM | POA: Diagnosis present

## 2019-06-30 DIAGNOSIS — Z20822 Contact with and (suspected) exposure to covid-19: Secondary | ICD-10-CM | POA: Diagnosis present

## 2019-06-30 DIAGNOSIS — O1493 Unspecified pre-eclampsia, third trimester: Secondary | ICD-10-CM

## 2019-06-30 DIAGNOSIS — O133 Gestational [pregnancy-induced] hypertension without significant proteinuria, third trimester: Secondary | ICD-10-CM | POA: Diagnosis present

## 2019-06-30 DIAGNOSIS — O10013 Pre-existing essential hypertension complicating pregnancy, third trimester: Secondary | ICD-10-CM | POA: Diagnosis present

## 2019-06-30 DIAGNOSIS — O99213 Obesity complicating pregnancy, third trimester: Secondary | ICD-10-CM | POA: Diagnosis not present

## 2019-06-30 DIAGNOSIS — Z3A34 34 weeks gestation of pregnancy: Secondary | ICD-10-CM | POA: Diagnosis not present

## 2019-06-30 DIAGNOSIS — G932 Benign intracranial hypertension: Secondary | ICD-10-CM

## 2019-06-30 DIAGNOSIS — A6000 Herpesviral infection of urogenital system, unspecified: Secondary | ICD-10-CM | POA: Diagnosis present

## 2019-06-30 DIAGNOSIS — O10913 Unspecified pre-existing hypertension complicating pregnancy, third trimester: Secondary | ICD-10-CM

## 2019-06-30 DIAGNOSIS — O24414 Gestational diabetes mellitus in pregnancy, insulin controlled: Secondary | ICD-10-CM | POA: Diagnosis not present

## 2019-06-30 DIAGNOSIS — F17210 Nicotine dependence, cigarettes, uncomplicated: Secondary | ICD-10-CM | POA: Diagnosis present

## 2019-06-30 DIAGNOSIS — O98313 Other infections with a predominantly sexual mode of transmission complicating pregnancy, third trimester: Secondary | ICD-10-CM | POA: Diagnosis present

## 2019-06-30 DIAGNOSIS — O99013 Anemia complicating pregnancy, third trimester: Secondary | ICD-10-CM | POA: Diagnosis present

## 2019-06-30 DIAGNOSIS — O99353 Diseases of the nervous system complicating pregnancy, third trimester: Secondary | ICD-10-CM | POA: Diagnosis present

## 2019-06-30 DIAGNOSIS — D649 Anemia, unspecified: Secondary | ICD-10-CM | POA: Diagnosis present

## 2019-06-30 LAB — URIC ACID: Uric Acid, Serum: 4 mg/dL (ref 2.5–7.1)

## 2019-06-30 LAB — COMPREHENSIVE METABOLIC PANEL
ALT: 10 U/L (ref 0–44)
AST: 11 U/L — ABNORMAL LOW (ref 15–41)
Albumin: 2.5 g/dL — ABNORMAL LOW (ref 3.5–5.0)
Alkaline Phosphatase: 70 U/L (ref 38–126)
Anion gap: 8 (ref 5–15)
BUN: 6 mg/dL (ref 6–20)
CO2: 21 mmol/L — ABNORMAL LOW (ref 22–32)
Calcium: 9.1 mg/dL (ref 8.9–10.3)
Chloride: 111 mmol/L (ref 98–111)
Creatinine, Ser: 0.51 mg/dL (ref 0.44–1.00)
GFR calc Af Amer: >60 mL/min (ref >60)
GFR calc non Af Amer: >60 mL/min (ref >60)
Glucose, Bld: 143 mg/dL — ABNORMAL HIGH (ref 70–99)
Potassium: 4.3 mmol/L (ref 3.5–5.1)
Sodium: 140 mmol/L (ref 135–145)
Total Bilirubin: 0.4 mg/dL (ref 0.3–1.2)
Total Protein: 5.7 g/dL — ABNORMAL LOW (ref 6.5–8.1)

## 2019-06-30 LAB — CBC
HCT: 31.6 % — ABNORMAL LOW (ref 36.0–46.0)
Hemoglobin: 9.9 g/dL — ABNORMAL LOW (ref 12.0–15.0)
MCH: 26 pg (ref 26.0–34.0)
MCHC: 31.3 g/dL (ref 30.0–36.0)
MCV: 82.9 fL (ref 80.0–100.0)
Platelets: 231 10*3/uL (ref 150–400)
RBC: 3.81 MIL/uL — ABNORMAL LOW (ref 3.87–5.11)
RDW: 13.3 % (ref 11.5–15.5)
WBC: 12.7 10*3/uL — ABNORMAL HIGH (ref 4.0–10.5)
nRBC: 0 % (ref 0.0–0.2)

## 2019-06-30 LAB — PROTEIN / CREATININE RATIO, URINE
Creatinine, Urine: 47.32 mg/dL
Protein Creatinine Ratio: 0.17 mg/mg{Cre} — ABNORMAL HIGH (ref 0.00–0.15)
Total Protein, Urine: 8 mg/dL

## 2019-06-30 LAB — OB RESULTS CONSOLE GBS: GBS: NEGATIVE

## 2019-06-30 MED ORDER — LACTATED RINGERS IV BOLUS: 500.0000 mL | Freq: Once | INTRAVENOUS | Status: AC

## 2019-06-30 NOTE — Consult Note (Signed)
MFM Consult  This patient was seen in consultation due to elevated blood pressures and possible preeclampsia.  The patient is a 29 year old gravida 1 para 0 currently at 34 weeks and 5 days.  She was admitted yesterday when her blood pressures were noted to be in the 180s over 100s range when she presented to the MAU to receive the second dose of betamethasone.  The patient reports that she was getting a course of antenatal corticosteroids due to her elevated blood pressures.  The patient reports that she does have a history of chronic hypertension.  She was treated with lisinopril for blood pressure control up until about 3 years ago when her blood pressures normalized following gastric sleeve surgery and weight loss.  She reports that her blood pressures were noted to be elevated again in her current pregnancy starting at around 32 weeks.  A plan had been in place for induction at around 37 weeks.  On admission, the patient received IV labetalol for blood pressure control.  Her blood pressures since being admitted to the hospital have been in the 140s to 150s/ 80s to 90s range.  Her P/C ratio on admission was 0.23 and 0.17.  Her preeclampsia labs were all within normal limits.  Currently, the patient denies any symptoms of preeclampsia.  She does note intermittent mild headaches.  She does have a history of pseudotumor cerebri.  The fetal status has been reassuring.  She had a growth ultrasound performed today that showed appropriate fetal growth with an EFW of 5 pounds 8 ounces (46 percentile for her gestational age).  There was normal amniotic fluid noted.  Her past medical history includes anxiety, chronic hypertension, and pseudotumor cerebri.  Her past surgical history includes gastric sleeve surgery, wrist and ankle surgery.  She does smoke cigarettes.  Due to the patient's elevated blood pressures, I would recommend continued observation in the hospital for the next 24 to 48 hours so that we can  monitor her blood pressures.  Due to the borderline proteinuria noted on her P/C ratio, she should have a 24-hour urine collected to  evaluate for total protein.    At her current gestational age, I would recommend delivery should she have persistently elevated blood pressures in the 150s to 160s over high 90s to 100s range.  Delivery would also be indicated should she require further IV antihypertensive medications or should she complain of a severe headache.  If her blood pressures remain in the 130s over 80s to 90s range and she remains asymptomatic, outpatient management may be considered with twice weekly fetal testing and blood pressure checks.  At the end of the consultation, the patient stated that all her questions had been answered to her complete satisfaction.  Thank you for referring this very nice patient for a Maternal-Fetal Medicine consultation.    Recommendations:  -Monitor blood pressures for the next 24 to 48 hours in the hospital -24-hour urine collection -Delivery is recommended should her blood pressures be persistently in the 150s to 160s over high 90s to 100s range -Delivery would also be recommended if she requires further IV antihypertensive medications or should she be symptomatic -Outpatient management with twice weekly fetal testing and blood pressure checks may be considered should her blood pressures be 130s over 80s to 90s or lower over the next 24 hours

## 2019-06-30 NOTE — Progress Notes (Signed)
Patient external fetal monitoring removed @0110  per CNM after +15x15 accelerations and increased reactivity.

## 2019-06-30 NOTE — Progress Notes (Signed)
29 y.o. G1P0000 [redacted]w[redacted]d Antenatal Day #2 for Pre-eclampsia. Presented to MAU for BMZ #2, severe range BP noted, admitted to [facility].   Subjective:    Called by RN to assess fetal surveillance. EFM applied @ 2215 for Q shift NST and was not reactive. Surveillance reviewed at bedside by CNM. Pt denies feeling ctx, reports active FM.   Objective:    VS: BP (!) 141/96 (BP Location: Left Arm)   Pulse (!) 104   Temp 98.5 F (36.9 C)   Resp 20   Ht 5\' 7"  (1.702 m)   Wt 130.6 kg Comment: in office on thursday  LMP 06/27/2018 Comment: negative HGC quantitative 06/27/18  SpO2 99%   BMI 45.11 kg/m  FHR : baseline 145 / variability moderate / accelerations present / absent decelerations occasional variable decel @ the beginning of NST  Toco: contractions irregular Membranes: intact    Assessment/Plan:   29 y.o. G1P0000 [redacted]w[redacted]d  Pre eclampsia    -negative for neuro S/S, mid-range BP, last Labetalol @ 1125 on 06/29/19, started Procardia XL 30 mg on 06/29/19  Fetal Wellbeing:  Category I I/D:  GBS results pending MFM consult in AM  07/01/19 CNM, MSN 06/30/2019 1:10 AM

## 2019-06-30 NOTE — Progress Notes (Signed)
This urine discarded and 24 hour urine for total protein collection started.

## 2019-06-30 NOTE — Progress Notes (Signed)
@  9480 this RN called CNM Rhea Pink to evaluate the patient's external fetal monitoring strip for +contractions, +decelerations, and minimal reactivity. Verbal orders received for a fluid bolus. CNM Grice en route to evaluate pt at the bedside.

## 2019-07-01 ENCOUNTER — Encounter: Payer: Self-pay | Admitting: Family Medicine

## 2019-07-01 DIAGNOSIS — O10919 Unspecified pre-existing hypertension complicating pregnancy, unspecified trimester: Secondary | ICD-10-CM | POA: Diagnosis present

## 2019-07-01 LAB — PROTEIN, URINE, 24 HOUR
Collection Interval-UPROT: 24 hours
Protein, 24H Urine: 270 mg/d — ABNORMAL HIGH (ref 50–100)
Protein, Urine: 9 mg/dL
Urine Total Volume-UPROT: 3000 mL

## 2019-07-01 MED ORDER — NIFEDIPINE ER OSMOTIC RELEASE 30 MG PO TB24: 60.0000 mg | ORAL_TABLET | Freq: Every day | ORAL | Status: DC

## 2019-07-01 MED ORDER — VALACYCLOVIR HCL 1 G PO TABS: 1000.0000 mg | ORAL_TABLET | Freq: Every day | ORAL | 0 refills | Status: DC

## 2019-07-01 MED ORDER — NIFEDIPINE ER OSMOTIC RELEASE 30 MG PO TB24
30.0000 mg | ORAL_TABLET | Freq: Once | ORAL | Status: AC
  Administered 2019-07-01: 30 mg via ORAL
  Filled 2019-07-01: qty 1

## 2019-07-01 MED ORDER — NIFEDIPINE ER 60 MG PO TB24: 60.0000 mg | ORAL_TABLET | Freq: Every day | ORAL | 0 refills | Status: DC

## 2019-07-01 NOTE — Discharge Instructions (Signed)
Preeclampsia and Eclampsia Preeclampsia is a serious condition that may develop during pregnancy. This condition causes high blood pressure and increased protein in your urine along with other symptoms, such as headaches and vision changes. These symptoms may develop as the condition gets worse. Preeclampsia may occur at 20 weeks of pregnancy or later. Diagnosing and treating preeclampsia early is very important. If not treated early, it can cause serious problems for you and your baby. One problem it can lead to is eclampsia. Eclampsia is a condition that causes muscle jerking or shaking (convulsions or seizures) and other serious problems for the mother. During pregnancy, delivering your baby may be the best treatment for preeclampsia or eclampsia. For most women, preeclampsia and eclampsia symptoms go away after giving birth. In rare cases, a woman may develop preeclampsia after giving birth (postpartum preeclampsia). This usually occurs within 48 hours after childbirth but may occur up to 6 weeks after giving birth. What are the causes? The cause of preeclampsia is not known. What increases the risk? The following risk factors make you more likely to develop preeclampsia:  Being pregnant for the first time.  Having had preeclampsia during a past pregnancy.  Having a family history of preeclampsia.  Having high blood pressure.  Being pregnant with more than one baby.  Being 35 or older.  Being African-American.  Having kidney disease or diabetes.  Having medical conditions such as lupus or blood diseases.  Being very overweight (obese). What are the signs or symptoms? The most common symptoms are:  Severe headaches.  Vision problems, such as blurred or double vision.  Abdominal pain, especially upper abdominal pain. Other symptoms that may develop as the condition gets worse include:  Sudden weight gain.  Sudden swelling of the hands, face, legs, and feet.  Severe nausea  and vomiting.  Numbness in the face, arms, legs, and feet.  Dizziness.  Urinating less than usual.  Slurred speech.  Convulsions or seizures. How is this diagnosed? There are no screening tests for preeclampsia. Your health care provider will ask you about symptoms and check for signs of preeclampsia during your prenatal visits. You may also have tests that include:  Checking your blood pressure.  Urine tests to check for protein. Your health care provider will check for this at every prenatal visit.  Blood tests.  Monitoring your baby's heart rate.  Ultrasound. How is this treated? You and your health care provider will determine the treatment approach that is best for you. Treatment may include:  Having more frequent prenatal exams to check for signs of preeclampsia, if you have an increased risk for preeclampsia.  Medicine to lower your blood pressure.  Staying in the hospital, if your condition is severe. There, treatment will focus on controlling your blood pressure and the amount of fluids in your body (fluid retention).  Taking medicine (magnesium sulfate) to prevent seizures. This may be given as an injection or through an IV.  Taking a low-dose aspirin during your pregnancy.  Delivering your baby early. You may have your labor started with medicine (induced), or you may have a cesarean delivery. Follow these instructions at home: Eating and drinking   Drink enough fluid to keep your urine pale yellow.  Avoid caffeine. Lifestyle  Do not use any products that contain nicotine or tobacco, such as cigarettes and e-cigarettes. If you need help quitting, ask your health care provider.  Do not use alcohol or drugs.  Avoid stress as much as possible. Rest and get   plenty of sleep. General instructions  Take over-the-counter and prescription medicines only as told by your health care provider.  When lying down, lie on your left side. This keeps pressure off your  major blood vessels.  When sitting or lying down, raise (elevate) your feet. Try putting some pillows underneath your lower legs.  Exercise regularly. Ask your health care provider what kinds of exercise are best for you.  Keep all follow-up and prenatal visits as told by your health care provider. This is important. How is this prevented? There is no known way of preventing preeclampsia or eclampsia from developing. However, to lower your risk of complications and detect problems early:  Get regular prenatal care. Your health care provider may be able to diagnose and treat the condition early.  Maintain a healthy weight. Ask your health care provider for help managing weight gain during pregnancy.  Work with your health care provider to manage any long-term (chronic) health conditions you have, such as diabetes or kidney problems.  You may have tests of your blood pressure and kidney function after giving birth.  Your health care provider may have you take low-dose aspirin during your next pregnancy. Contact a health care provider if:  You have symptoms that your health care provider told you may require more treatment or monitoring, such as: ? Headaches. ? Nausea or vomiting. ? Abdominal pain. ? Dizziness. ? Light-headedness. Get help right away if:  You have severe: ? Abdominal pain. ? Headaches that do not get better. ? Dizziness. ? Vision problems. ? Confusion. ? Nausea or vomiting.  You have any of the following: ? A seizure. ? Sudden, rapid weight gain. ? Sudden swelling in your hands, ankles, or face. ? Trouble moving any part of your body. ? Numbness in any part of your body. ? Trouble speaking. ? Abnormal bleeding.  You faint. Summary  Preeclampsia is a serious condition that may develop during pregnancy.  This condition causes high blood pressure and increased protein in your urine along with other symptoms, such as headaches and vision  changes.  Diagnosing and treating preeclampsia early is very important. If not treated early, it can cause serious problems for you and your baby.  Get help right away if you have symptoms that your health care provider told you to watch for. This information is not intended to replace advice given to you by your health care provider. Make sure you discuss any questions you have with your health care provider. Document Revised: 01/29/2018 Document Reviewed: 01/03/2016 Elsevier Patient Education  2020 [employer]  

## 2019-07-01 NOTE — Progress Notes (Signed)
S: Doing well, reports active fetal movement. Denies HA and other neuro S/S.   O: Vitals:   06/30/19 2357 07/01/19 0328  BP: (!) 146/99 126/86  Pulse: 97 (!) 103  Resp: 20 17  Temp: 97.9 F (36.6 C) 97.9 F (36.6 C)  SpO2: 100% 99%   NST FHR: Baseline 140/Moderate variability/ accels present/ decels absent TOCO: no uterine activity U/S: Vertex/ AFI 20/EFW 5# 8oz  Assessment/Plan:   29 y.o. G1P0000 [redacted]w[redacted]d  Pre eclampsia    -negative for neuro S/S, mid-range BP, last Labetalol @ 1125 on 06/29/19, started Procardia XL 30 mg on 06/29/19 Betamethasone complete Category 1 GBS results pending NICU consult pending  MFM consult completed. Recommendations: -Monitor blood pressures for the next 24 to 48 hours in the hospital -24-hour urine collection (24 hr collection complete 07/01/19 @ 0940) -Delivery is recommended should her blood pressures be persistently in the 150s to 160s over high 90s to 100s range -Delivery would also be recommended if she requires further IV antihypertensive medications or should she be symptomatic -Outpatient management with twice weekly fetal testing and blood pressure checks may be considered should her blood pressures be 130s over 80s to 90s or lower over the next 24 hours  Rhea Pink, MSN, CNM 06/30/2019

## 2019-07-01 NOTE — Progress Notes (Signed)
Discharge instructions and follow up care reviewed with patient. Patient verbalized understanding of preeclampsia education, medications, and follow up care.

## 2019-07-01 NOTE — Discharge Summary (Signed)
ANtepartum OB Discharge Summary     Patient Name: Cassandra Tucker DOB: [birth year] MRN: [id number]  Date of admission: 06/29/2019 Delivering MD: This patient has no babies on file. Date of discharge: 07/01/2019  Admitting diagnosis: Preeclampsia, third trimester [O14.93] Preeclampsia [O14.90] Intrauterine pregnancy: [redacted]w[redacted]d     Secondary diagnosis:  Active Problems:   Pseudotumor cerebri syndrome   Pregnancy   BMI 45.0-49.9, adult (HCC)   Chronic hypertension complicating or reason for care during pregnancy, third trimester   History of Present Illness: Cassandra Tucker is a 28 y.o. female, G1P0000, who presents at [redacted]w[redacted]d weeks gestation. The patient has been followed at  [facility]  Her pregnancy has been complicated by:  Patient Active Problem List   Diagnosis Date Noted  . Chronic hypertension complicating or reason for care during pregnancy, third trimester 07/01/2019  . Pregnancy 06/29/2019  . BMI 45.0-49.9, adult (HCC) 06/29/2019  . Vitamin D deficiency 02/26/2017  . Class 3 obesity with serious comorbidity and body mass index (BMI) of 60.0 to 69.9 in adult 02/26/2017  . Controlled type 2 diabetes mellitus without complication, without long-term current use of insulin (HCC) 04/21/2016  . Gastroesophageal reflux disease without esophagitis 04/21/2016  . Pseudotumor cerebri syndrome 11/29/2015  . Super obesity 11/29/2015  . OSA on CPAP 11/29/2015  . RLS (restless legs syndrome) 11/29/2015  . Papilledema 06/16/2015  . Pain in the wrist 04/28/2014  . 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 HLD (hyperlipidemia) 01/21/2014  . Sinus tachycardia 12/04/2011  . Essential hypertension 12/04/2011    Hospital course:  Per HPI: Cassandra Tucker is a 29 y.o. female presenting for second BMZ injection for gest HTN noted in office this week. During MAU visit she was noted to have severe range BP, received IV labetalol and improved to mild range.   Denies neural sx, reports hx HA but they are typical for her d/t hx pseudotumor cerebry, neuro consult in the past, pending new consult.   Prenatal History: G1P0000   EDC : 08/06/2019, by Other Basis  Prenatal care at [facility] since 12 weeks.   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 - Hx E-coli UTI, TOC neg - hx benign intracranial HTN, neuro consult pending - anxiety/depression stable off meds - anemia, hgb 10.8, oral Fe rx'ed - gest HTN started at 32 wks, PEC labs wnl 06/26/2019, neg PCR creat 0.46, AST/ALT 8/5 BMZ series completed 1/16, 1/17  Today 07/01/19: When diving further into pt history, pt has CHTN not GHTN that started years ago and was on lisinopril but after her Gastric Bypass she was able to come off her HTN meds, Pt denies HA, RUQ pain or vision changes, pt stable, endorses feels much better, pt desired discharge to home today, Consulted with Dr Estanislado Pandy, ok to increase preocardia to 60 mg XL daily now, with repeat in BP to be discharged after 2 more BP < 150/100s, with close follow up, 2 day BP check, Q2 weeks NST, and will continue to take procardia 60xl mg daily, report s/sx of preeclampsia. Pt stable at time of discharge and BP is 136/84 and 24 hour resulted 270. Marland Kitchen Pt also has h/o HSV and will continue to take valtrex which sahe was started on prophylactic during this hospital stay. F/U for induction will be reviewed with [facility] out pt Doctors.  MFM recommends:  -Delivery is recommended should her blood pressures be persistently in the 150s to 160s over high 90s to 100s range -Delivery would also be recommended if she requires further IV antihypertensive medications or should she be symptomatic -Outpatient management with twice weekly fetal testing and blood pressure checks may be considered should her blood pressures be 130s over 80s to 90s or lower  over the next 24 hours.  Physical exam  Vitals:   06/30/19 2357 07/01/19 0328 07/01/19 0903 07/01/19 1309  BP: (!) 146/99 126/86 (!) 149/104 136/84  Pulse: 97 (!) 103 (!) 102 (!) 101  Resp: 20 17 18 18   Temp: 97.9 F (36.6 C) 97.9 F (36.6 C) 98.1 F (36.7 C) 98.9 F (37.2 C)  TempSrc: Oral Oral Oral Oral  SpO2: 100% 99% 100% 100%  Weight:      Height:       General: alert, cooperative and no distress Lochia: appropriate Uterine: Soft- non-tender.  Perineum: Intact DVT Evaluation: No evidence of DVT seen on physical exam. Negative Homan's sign. No cords or calf tenderness. Lower extremity generalized edema. 2+ Patellar, DTR, no clonus    Labs: Lab Results  Component Value Date   WBC 12.7 (H) 06/30/2019   HGB 9.9 (L) 06/30/2019   HCT 31.6 (L) 06/30/2019   MCV 82.9 06/30/2019   PLT 231 06/30/2019   CMP Latest Ref Rng & Units 06/30/2019  Glucose 70 - 99 mg/dL 143(H)  BUN 6 - 20 mg/dL 6  Creatinine 0.44 - 1.00 mg/dL 0.51  Sodium 135 - 145 mmol/L 140  Potassium 3.5 - 5.1 mmol/L 4.3  Chloride 98 - 111 mmol/L 111  CO2 22 - 32 mmol/L 21(L)  Calcium 8.9 - 10.3 mg/dL 9.1  Total Protein 6.5 - 8.1 g/dL 5.7(L)  Total Bilirubin 0.3 - 1.2 mg/dL 0.4  Alkaline Phos 38 - 126 U/L 70  AST 15 - 41 U/L 11(L)  ALT 0 - 44 U/L 10    Date of discharge: 07/01/2019 Discharge Diagnoses: [place] Discharge instruction: per After Visit Summary  After visit meds:   Activity:           unrestricted Advance as tolerated. Pelvic rest for 6 weeks.  Diet:                routine Medications: PNV and Procardia 30 xlmg daily for [facility].  Condition:  Pt discharge to home in stable condition  CHTN: Continue procardia 60mg  xl daily.  HSV: Continue valtrex for prophylactics.    Meds: Allergies as of 07/01/2019   No Known Allergies     Medication List    TAKE these medications   Albuterol Sulfate 108 (90 Base) MCG/ACT Aepb Inhale 2 puffs into the lungs 2 (two) times daily as needed for wheezing  or shortness of breath.   albuterol 108 (90 Base) MCG/ACT inhaler Commonly known as: VENTOLIN HFA Inhale 2 puffs into the lungs every 6 (six) hours as needed for wheezing or shortness of breath.   budesonide 180 MCG/ACT inhaler Commonly known as: PULMICORT Inhale 2 puffs into the lungs 2 (two) times daily as needed (shortness of breath).   dicyclomine 20 MG tablet Commonly known as: BENTYL Take 1 tablet (20 mg total) by mouth 2 (two) times daily.   ferrous sulfate 325 (65 FE) MG tablet Take 325 mg by mouth daily.   NIFEdipine 60 MG 24 hr tablet Commonly known as: ADALAT CC Take 1 tablet (60 mg total) by mouth daily. Start taking on: July 02, 2019   Prenatal Vitamin and Mineral 28-0.8 MG Tabs Take 1 tablet by mouth daily.   valACYclovir 1000 MG tablet Commonly known as: VALTREX Take 1 tablet (1,000 mg total) by mouth daily. Start taking on: July 02, 2019   Vistaril 50 MG capsule Generic drug: hydrOXYzine Take 50 mg by mouth 4 (four) times daily as needed for anxiety.       Discharge Follow Up:  Follow-up Information    [facility] Follow up.   Specialty: Obstetrics and Gynecology Why: 2 days BP check, Next ROB, every 2 weeks NST's.  Contact information: [address] [place] [address] [phone]           Cannon Ball, NP-C, CNM 07/01/2019, 1:41 PM  [place], [place]

## 2019-07-02 LAB — CULTURE, BETA STREP (GROUP B ONLY)

## 2019-07-03 DIAGNOSIS — Z3A35 35 weeks gestation of pregnancy: Secondary | ICD-10-CM | POA: Diagnosis not present

## 2019-07-03 DIAGNOSIS — O139 Gestational [pregnancy-induced] hypertension without significant proteinuria, unspecified trimester: Secondary | ICD-10-CM | POA: Diagnosis not present

## 2019-07-03 DIAGNOSIS — G932 Benign intracranial hypertension: Secondary | ICD-10-CM | POA: Diagnosis not present

## 2019-07-03 DIAGNOSIS — Z3493 Encounter for supervision of normal pregnancy, unspecified, third trimester: Secondary | ICD-10-CM | POA: Diagnosis not present

## 2019-07-04 ENCOUNTER — Encounter (HOSPITAL_COMMUNITY): Payer: Self-pay | Admitting: *Deleted

## 2019-07-04 ENCOUNTER — Telehealth (HOSPITAL_COMMUNITY): Payer: Self-pay | Admitting: *Deleted

## 2019-07-04 NOTE — Telephone Encounter (Signed)
Preadmission screen  

## 2019-07-07 ENCOUNTER — Encounter (HOSPITAL_COMMUNITY): Payer: Self-pay | Admitting: *Deleted

## 2019-07-07 ENCOUNTER — Telehealth (HOSPITAL_COMMUNITY): Payer: Self-pay | Admitting: *Deleted

## 2019-07-07 NOTE — Telephone Encounter (Signed)
Preadmission screen  

## 2019-07-08 DIAGNOSIS — O99213 Obesity complicating pregnancy, third trimester: Secondary | ICD-10-CM | POA: Diagnosis not present

## 2019-07-08 DIAGNOSIS — O43129 Velamentous insertion of umbilical cord, unspecified trimester: Secondary | ICD-10-CM | POA: Diagnosis not present

## 2019-07-08 DIAGNOSIS — F419 Anxiety disorder, unspecified: Secondary | ICD-10-CM | POA: Diagnosis not present

## 2019-07-08 DIAGNOSIS — R81 Glycosuria: Secondary | ICD-10-CM | POA: Diagnosis not present

## 2019-07-08 DIAGNOSIS — Z3A35 35 weeks gestation of pregnancy: Secondary | ICD-10-CM | POA: Diagnosis not present

## 2019-07-08 DIAGNOSIS — Z3493 Encounter for supervision of normal pregnancy, unspecified, third trimester: Secondary | ICD-10-CM | POA: Diagnosis not present

## 2019-07-08 DIAGNOSIS — O139 Gestational [pregnancy-induced] hypertension without significant proteinuria, unspecified trimester: Secondary | ICD-10-CM | POA: Diagnosis not present

## 2019-07-10 ENCOUNTER — Other Ambulatory Visit: Payer: Self-pay | Admitting: Obstetrics & Gynecology

## 2019-07-14 ENCOUNTER — Other Ambulatory Visit (HOSPITAL_COMMUNITY)
Admission: RE | Admit: 2019-07-14 | Discharge: 2019-07-14 | Disposition: A | Discharge status: Home / Self Care | Payer: Medicaid Other | Source: Ambulatory Visit | Attending: Obstetrics & Gynecology | Admitting: Obstetrics & Gynecology

## 2019-07-14 DIAGNOSIS — Z01812 Encounter for preprocedural laboratory examination: Secondary | ICD-10-CM | POA: Insufficient documentation

## 2019-07-14 DIAGNOSIS — Z20822 Contact with and (suspected) exposure to covid-19: Secondary | ICD-10-CM | POA: Insufficient documentation

## 2019-07-14 LAB — SARS CORONAVIRUS 2 (TAT 6-24 HRS): SARS Coronavirus 2: NEGATIVE

## 2019-07-15 ENCOUNTER — Encounter: Payer: Self-pay | Admitting: Obstetrics and Gynecology

## 2019-07-16 ENCOUNTER — Inpatient Hospital Stay (HOSPITAL_COMMUNITY)
Admission: AD | Admit: 2019-07-16 | Discharge: 2019-07-20 | DRG: 787 | Disposition: A | Discharge status: Home / Self Care | Payer: Medicaid Other | Attending: Neonatology | Admitting: Neonatology

## 2019-07-16 ENCOUNTER — Inpatient Hospital Stay (HOSPITAL_COMMUNITY): Payer: Medicaid Other

## 2019-07-16 ENCOUNTER — Encounter (HOSPITAL_COMMUNITY): Payer: Self-pay | Admitting: Obstetrics & Gynecology

## 2019-07-16 ENCOUNTER — Inpatient Hospital Stay (HOSPITAL_COMMUNITY): Payer: Medicaid Other | Admitting: Anesthesiology

## 2019-07-16 ENCOUNTER — Other Ambulatory Visit: Payer: Self-pay

## 2019-07-16 DIAGNOSIS — F419 Anxiety disorder, unspecified: Secondary | ICD-10-CM | POA: Diagnosis not present

## 2019-07-16 DIAGNOSIS — O9081 Anemia of the puerperium: Secondary | ICD-10-CM | POA: Diagnosis not present

## 2019-07-16 DIAGNOSIS — Z20822 Contact with and (suspected) exposure to covid-19: Secondary | ICD-10-CM | POA: Diagnosis present

## 2019-07-16 DIAGNOSIS — Z87891 Personal history of nicotine dependence: Secondary | ICD-10-CM | POA: Diagnosis not present

## 2019-07-16 DIAGNOSIS — O10013 Pre-existing essential hypertension complicating pregnancy, third trimester: Principal | ICD-10-CM | POA: Diagnosis present

## 2019-07-16 DIAGNOSIS — O411230 Chorioamnionitis, third trimester, not applicable or unspecified: Secondary | ICD-10-CM | POA: Diagnosis not present

## 2019-07-16 DIAGNOSIS — O99214 Obesity complicating childbirth: Secondary | ICD-10-CM | POA: Diagnosis present

## 2019-07-16 DIAGNOSIS — O99844 Bariatric surgery status complicating childbirth: Secondary | ICD-10-CM | POA: Diagnosis present

## 2019-07-16 DIAGNOSIS — O9952 Diseases of the respiratory system complicating childbirth: Secondary | ICD-10-CM | POA: Diagnosis present

## 2019-07-16 DIAGNOSIS — O99892 Other specified diseases and conditions complicating childbirth: Secondary | ICD-10-CM | POA: Diagnosis present

## 2019-07-16 DIAGNOSIS — O9832 Other infections with a predominantly sexual mode of transmission complicating childbirth: Secondary | ICD-10-CM | POA: Diagnosis present

## 2019-07-16 DIAGNOSIS — Z3A37 37 weeks gestation of pregnancy: Secondary | ICD-10-CM

## 2019-07-16 DIAGNOSIS — O43123 Velamentous insertion of umbilical cord, third trimester: Secondary | ICD-10-CM | POA: Diagnosis present

## 2019-07-16 DIAGNOSIS — O10919 Unspecified pre-existing hypertension complicating pregnancy, unspecified trimester: Secondary | ICD-10-CM

## 2019-07-16 DIAGNOSIS — G932 Benign intracranial hypertension: Secondary | ICD-10-CM | POA: Diagnosis present

## 2019-07-16 DIAGNOSIS — R000 Tachycardia, unspecified: Secondary | ICD-10-CM | POA: Diagnosis present

## 2019-07-16 DIAGNOSIS — O99344 Other mental disorders complicating childbirth: Secondary | ICD-10-CM | POA: Diagnosis present

## 2019-07-16 DIAGNOSIS — O134 Gestational [pregnancy-induced] hypertension without significant proteinuria, complicating childbirth: Secondary | ICD-10-CM | POA: Diagnosis present

## 2019-07-16 DIAGNOSIS — O320XX0 Maternal care for unstable lie, not applicable or unspecified: Secondary | ICD-10-CM | POA: Diagnosis not present

## 2019-07-16 DIAGNOSIS — O99354 Diseases of the nervous system complicating childbirth: Secondary | ICD-10-CM | POA: Diagnosis not present

## 2019-07-16 DIAGNOSIS — Z9884 Bariatric surgery status: Secondary | ICD-10-CM

## 2019-07-16 DIAGNOSIS — D62 Acute posthemorrhagic anemia: Secondary | ICD-10-CM | POA: Diagnosis not present

## 2019-07-16 DIAGNOSIS — J45909 Unspecified asthma, uncomplicated: Secondary | ICD-10-CM | POA: Diagnosis present

## 2019-07-16 DIAGNOSIS — Z6841 Body Mass Index (BMI) 40.0 and over, adult: Secondary | ICD-10-CM

## 2019-07-16 DIAGNOSIS — D649 Anemia, unspecified: Secondary | ICD-10-CM | POA: Diagnosis present

## 2019-07-16 DIAGNOSIS — O43893 Other placental disorders, third trimester: Secondary | ICD-10-CM | POA: Diagnosis not present

## 2019-07-16 DIAGNOSIS — A6000 Herpesviral infection of urogenital system, unspecified: Secondary | ICD-10-CM | POA: Diagnosis present

## 2019-07-16 DIAGNOSIS — O133 Gestational [pregnancy-induced] hypertension without significant proteinuria, third trimester: Secondary | ICD-10-CM | POA: Diagnosis present

## 2019-07-16 DIAGNOSIS — O43199 Other malformation of placenta, unspecified trimester: Secondary | ICD-10-CM | POA: Insufficient documentation

## 2019-07-16 LAB — LACTATE DEHYDROGENASE: LDH: 113 U/L (ref 98–192)

## 2019-07-16 LAB — CBC
HCT: 33.9 % — ABNORMAL LOW (ref 36.0–46.0)
Hemoglobin: 10.5 g/dL — ABNORMAL LOW (ref 12.0–15.0)
MCH: 25.5 pg — ABNORMAL LOW (ref 26.0–34.0)
MCHC: 31 g/dL (ref 30.0–36.0)
MCV: 82.5 fL (ref 80.0–100.0)
Platelets: 259 10*3/uL (ref 150–400)
RBC: 4.11 MIL/uL (ref 3.87–5.11)
RDW: 14.2 % (ref 11.5–15.5)
WBC: 11.3 10*3/uL — ABNORMAL HIGH (ref 4.0–10.5)
nRBC: 0 % (ref 0.0–0.2)

## 2019-07-16 LAB — CBC WITH DIFFERENTIAL/PLATELET
Abs Immature Granulocytes: 0.06 10*3/uL (ref 0.00–0.07)
Basophils Absolute: 0 10*3/uL (ref 0.0–0.1)
Basophils Relative: 0 %
Eosinophils Absolute: 0 10*3/uL (ref 0.0–0.5)
Eosinophils Relative: 0 %
HCT: 33.8 % — ABNORMAL LOW (ref 36.0–46.0)
Hemoglobin: 10.5 g/dL — ABNORMAL LOW (ref 12.0–15.0)
Immature Granulocytes: 1 %
Lymphocytes Relative: 18 %
Lymphs Abs: 2 10*3/uL (ref 0.7–4.0)
MCH: 25.4 pg — ABNORMAL LOW (ref 26.0–34.0)
MCHC: 31.1 g/dL (ref 30.0–36.0)
MCV: 81.6 fL (ref 80.0–100.0)
Monocytes Absolute: 0.7 10*3/uL (ref 0.1–1.0)
Monocytes Relative: 7 %
Neutro Abs: 8.1 10*3/uL — ABNORMAL HIGH (ref 1.7–7.7)
Neutrophils Relative %: 74 %
Platelets: 208 10*3/uL (ref 150–400)
RBC: 4.14 MIL/uL (ref 3.87–5.11)
RDW: 14.1 % (ref 11.5–15.5)
WBC: 10.9 10*3/uL — ABNORMAL HIGH (ref 4.0–10.5)
nRBC: 0 % (ref 0.0–0.2)

## 2019-07-16 LAB — COMPREHENSIVE METABOLIC PANEL
ALT: 11 U/L (ref 0–44)
AST: 11 U/L — ABNORMAL LOW (ref 15–41)
Albumin: 2.5 g/dL — ABNORMAL LOW (ref 3.5–5.0)
Alkaline Phosphatase: 85 U/L (ref 38–126)
Anion gap: 10 (ref 5–15)
BUN: 10 mg/dL (ref 6–20)
CO2: 20 mmol/L — ABNORMAL LOW (ref 22–32)
Calcium: 9.2 mg/dL (ref 8.9–10.3)
Chloride: 108 mmol/L (ref 98–111)
Creatinine, Ser: 0.6 mg/dL (ref 0.44–1.00)
GFR calc Af Amer: >60 mL/min (ref >60)
GFR calc non Af Amer: >60 mL/min (ref >60)
Glucose, Bld: 96 mg/dL (ref 70–99)
Potassium: 4 mmol/L (ref 3.5–5.1)
Sodium: 138 mmol/L (ref 135–145)
Total Bilirubin: 0.2 mg/dL — ABNORMAL LOW (ref 0.3–1.2)
Total Protein: 5.9 g/dL — ABNORMAL LOW (ref 6.5–8.1)

## 2019-07-16 LAB — PROTEIN / CREATININE RATIO, URINE
Creatinine, Urine: 143.95 mg/dL
Protein Creatinine Ratio: 0.22 mg/mg{Cre} — ABNORMAL HIGH (ref 0.00–0.15)
Total Protein, Urine: 31 mg/dL

## 2019-07-16 LAB — URIC ACID: Uric Acid, Serum: 4.9 mg/dL (ref 2.5–7.1)

## 2019-07-16 LAB — TYPE AND SCREEN
ABO/RH(D): O POS
Antibody Screen: NEGATIVE

## 2019-07-16 LAB — RPR: RPR Ser Ql: NONREACTIVE

## 2019-07-16 MED ORDER — LACTATED RINGERS IV SOLN: INTRAVENOUS | Status: DC

## 2019-07-16 MED ORDER — SODIUM CHLORIDE (PF) 0.9 % IJ SOLN
INTRAMUSCULAR | Status: DC | PRN
  Administered 2019-07-16: 12 mL/h via EPIDURAL

## 2019-07-16 MED ORDER — LABETALOL HCL 5 MG/ML IV SOLN
INTRAVENOUS | Status: AC
  Filled 2019-07-16: qty 4

## 2019-07-16 MED ORDER — NIFEDIPINE ER OSMOTIC RELEASE 60 MG PO TB24
60.0000 mg | ORAL_TABLET | Freq: Every day | ORAL | Status: DC
  Administered 2019-07-17: 60 mg via ORAL
  Filled 2019-07-16: qty 1
  Filled 2019-07-16: qty 2
  Filled 2019-07-16: qty 1

## 2019-07-16 MED ORDER — SOD CITRATE-CITRIC ACID 500-334 MG/5ML PO SOLN
30.0000 mL | ORAL | Status: DC | PRN
  Filled 2019-07-16: qty 30

## 2019-07-16 MED ORDER — LIDOCAINE HCL (PF) 1 % IJ SOLN: 30.0000 mL | INTRAMUSCULAR | Status: DC | PRN

## 2019-07-16 MED ORDER — ONDANSETRON HCL 4 MG/2ML IJ SOLN
4.0000 mg | Freq: Four times a day (QID) | INTRAMUSCULAR | Status: DC | PRN
  Administered 2019-07-16 – 2019-07-17 (×2): 4 mg via INTRAVENOUS
  Filled 2019-07-16: qty 2

## 2019-07-16 MED ORDER — ACETAMINOPHEN 325 MG PO TABS: 650.0000 mg | ORAL_TABLET | ORAL | Status: DC | PRN

## 2019-07-16 MED ORDER — ZOLPIDEM TARTRATE 5 MG PO TABS
5.0000 mg | ORAL_TABLET | Freq: Every evening | ORAL | Status: DC | PRN
  Administered 2019-07-16: 5 mg via ORAL
  Filled 2019-07-16: qty 1

## 2019-07-16 MED ORDER — TERBUTALINE SULFATE 1 MG/ML IJ SOLN
0.2500 mg | Freq: Once | INTRAMUSCULAR | Status: AC | PRN
  Administered 2019-07-16: 0.25 mg via SUBCUTANEOUS
  Filled 2019-07-16: qty 1

## 2019-07-16 MED ORDER — OXYTOCIN 40 UNITS IN NORMAL SALINE INFUSION - SIMPLE MED
1.0000 m[IU]/min | INTRAVENOUS | Status: DC
  Administered 2019-07-16 – 2019-07-17 (×2): 1 m[IU]/min via INTRAVENOUS
  Filled 2019-07-16: qty 1000

## 2019-07-16 MED ORDER — OXYTOCIN 40 UNITS IN NORMAL SALINE INFUSION - SIMPLE MED
2.5000 [IU]/h | INTRAVENOUS | Status: DC
  Administered 2019-07-17: 07:00:00 40 [IU] via INTRAVENOUS

## 2019-07-16 MED ORDER — FENTANYL-BUPIVACAINE-NACL 0.5-0.125-0.9 MG/250ML-% EP SOLN
EPIDURAL | Status: AC
  Filled 2019-07-16: qty 250

## 2019-07-16 MED ORDER — LABETALOL HCL 5 MG/ML IV SOLN
40.0000 mg | INTRAVENOUS | Status: DC | PRN
  Administered 2019-07-16: 11:00:00 40 mg via INTRAVENOUS
  Filled 2019-07-16: qty 8

## 2019-07-16 MED ORDER — LIDOCAINE HCL (PF) 1 % IJ SOLN
INTRAMUSCULAR | Status: DC | PRN
  Administered 2019-07-16: 5 mL via EPIDURAL
  Administered 2019-07-16: 4 mL via EPIDURAL

## 2019-07-16 MED ORDER — LACTATED RINGERS IV SOLN
500.0000 mL | INTRAVENOUS | Status: DC | PRN
  Administered 2019-07-16: 500 mL via INTRAVENOUS
  Administered 2019-07-16: 13:00:00 300 mL via INTRAVENOUS
  Administered 2019-07-16: 1000 mL via INTRAVENOUS

## 2019-07-16 MED ORDER — MISOPROSTOL 25 MCG QUARTER TABLET
25.0000 ug | ORAL_TABLET | ORAL | Status: DC | PRN
  Administered 2019-07-16 (×2): 25 ug via VAGINAL
  Filled 2019-07-16 (×2): qty 1

## 2019-07-16 MED ORDER — HYDRALAZINE HCL 20 MG/ML IJ SOLN: 10.0000 mg | INTRAMUSCULAR | Status: DC | PRN

## 2019-07-16 MED ORDER — PHENYLEPHRINE 40 MCG/ML (10ML) SYRINGE FOR IV PUSH (FOR BLOOD PRESSURE SUPPORT)
PREFILLED_SYRINGE | INTRAVENOUS | Status: AC
  Administered 2019-07-16: 400 ug
  Filled 2019-07-16: qty 10

## 2019-07-16 MED ORDER — FLEET ENEMA 7-19 GM/118ML RE ENEM: 1.0000 | ENEMA | RECTAL | Status: DC | PRN

## 2019-07-16 MED ORDER — LABETALOL HCL 5 MG/ML IV SOLN
20.0000 mg | INTRAVENOUS | Status: DC | PRN
  Administered 2019-07-16 (×2): 20 mg via INTRAVENOUS
  Filled 2019-07-16: qty 4

## 2019-07-16 MED ORDER — FENTANYL CITRATE (PF) 100 MCG/2ML IJ SOLN
50.0000 ug | INTRAMUSCULAR | Status: DC | PRN
  Administered 2019-07-16 (×3): 100 ug via INTRAVENOUS
  Filled 2019-07-16 (×3): qty 2

## 2019-07-16 MED ORDER — LABETALOL HCL 5 MG/ML IV SOLN: 80.0000 mg | INTRAVENOUS | Status: DC | PRN

## 2019-07-16 MED ORDER — OXYTOCIN BOLUS FROM INFUSION: 500.0000 mL | Freq: Once | INTRAVENOUS | Status: DC

## 2019-07-16 NOTE — Progress Notes (Deleted)
   07/16/19 0032 07/16/19 0042 07/16/19 0105  Vitals  Temp 98.6 F (37 C)  --   --   Temp Source Oral  --   --   BP (!) 163/110 (!) 170/99 (!) 151/86  Pulse Rate (!) 122 (!) 109 97  Resp 18  --  18

## 2019-07-16 NOTE — Progress Notes (Addendum)
Subjective:    Comfortable in left side-lying position. Answered questions RE: plan of care for today and what pt can expect for delivery of baby. Discussed vaginal delivery and C-Section if required for fetal intolerance. Pt and spouse, Cassandra Tucker, state that they are open to any option that gives them a healthy baby.    Objective:    VS: BP (!) 149/74   Pulse (!) 106   Temp 98 F (36.7 C) (Oral)   Resp 20   Ht 5\' 6"  (1.676 m)   Wt 133.5 kg   BMI 47.52 kg/m  FHR : baseline 150 / variability moderate / accelerations absent / variable decelerations Toco: none per TOCO since tocolysis w/ Terb, pt denies feeling ctx, none palpated Membranes: Intact  Dilation: 2 Effacement (%): 50 Cervical Position: Posterior Station: -4 Presentation: Vertex Exam by:: [email or web address] CNM  Assessment/Plan:   29 y.o. G1P0000 [redacted]w[redacted]d  Labor: Cytotec x2, last dose @ 0500. Will insert Cook Catheter Preeclampsia:  no signs or symptoms of toxicity and labs stable Fetal Wellbeing:  Category II Pain Control:  Labor support without medications, desires IV sedation for [facility] cath insertion I/D:  GBS negative Anticipated MOD:  NSVD  [facility] CNM, MSN 07/16/2019 9:04 AM  Addendum 09/13/2019 cath inserted w/o diff and pt tolerated well. 42ml in vaginal/ 51ml in vaginal balloon. Cervix dilated 2/50/-4 after manipulation for balloon placement.  43m, MSN, CNM 07/16/2019 10:51 AM

## 2019-07-16 NOTE — Anesthesia Preprocedure Evaluation (Addendum)
Anesthesia Evaluation  Patient identified by MRN, date of birth, ID band Patient awake    Reviewed: Allergy & Precautions, NPO status , Patient's Chart, lab work & pertinent test results  History of Anesthesia Complications Negative for: history of anesthetic complications  Airway Mallampati: II   Neck ROM: Full    Dental   Pulmonary asthma , sleep apnea and Continuous Positive Airway Pressure Ventilation , former smoker,    Pulmonary exam normal        Cardiovascular hypertension (PIH), Pt. 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   Reproductive/Obstetrics (+) Pregnancy                           Anesthesia Physical Anesthesia Plan  ASA: III and emergent  Anesthesia Plan: Epidural   Post-op Pain Management:    Induction:   PONV Risk Score and Plan: 2 and Treatment may vary due to age or medical condition  Airway Management Planned: Natural Airway  Additional Equipment: None  Intra-op Plan:   Post-operative Plan:   Informed Consent: I have reviewed the patients History and Physical, chart, labs and discussed the procedure including the risks, benefits and alternatives for the proposed anesthesia with the patient or authorized representative who has indicated his/her understanding and acceptance.       Plan Discussed with:  Anesthesiologist, CRNA and Surgeon  Anesthesia Plan Comments: (Labs reviewed. Platelets acceptable, patient not taking any blood thinning medications. Per RN, FHR tracing reported to be stable enough for sitting procedure. Risks and benefits discussed with patient, including PDPH, backache, epidural hematoma, failed epidural, blood pressure changes, allergic reaction, and nerve injury. Patient expressed understanding and wished to proceed.  Patient will be going for C/Section for failure to progress. Will use epidural for C/Section. M. Malen Gauze, MD)      Anesthesia Quick Evaluation

## 2019-07-16 NOTE — Progress Notes (Addendum)
CNM attempting to obtain FHT's.  Awaiting lab results for epidural placement.

## 2019-07-16 NOTE — Progress Notes (Signed)
Labor Progress Note Cassandra Tucker is a 29 year old G1P0 at 37.0 weeks IOL GHTN Treated with labetalol x 1 Procardia due q am cytotec given x 2 doses FHR tracing eval  Subj:  Resting comfortably  Obj:  Visit Vitals BP 145/90 (BP Location: Left arm, Patient Position: Sitting)  Pulse 100  Temp 98.0  Resp 16               Cervix: Dilation: 0 / Effacement (%): 0 / Station: -3 Baseline FHR: 150    Variability: Moderate    Accelerations: Absent   Decelerations: repetitive variables Contractions: present q 1-3 minutes Overall assessment: category II   A/P: 29 y.o. G1P0 at 37.0 weeks IOL GHTN 1.  Cytotec given x 2, hold future doses 2.  Cat II tracing, maternal position change, terb  3.  GBS: neg 4.  Pain control as desires 5.  Recheck: SVE PRN to determine further ripening method 6. Report given to Lizbeth Bark CNM and Dr. Estanislado Pandy for transfer of care  [facility], CNM

## 2019-07-16 NOTE — Progress Notes (Addendum)
Subjective:    Remains comfortable w/ epidural w. Some movement of lower extremities. Called to bedside by RN for spontaneous dislodging of IUPC. TOCO in use, unable to assess uterine activity.   Objective:    VS: BP (!) 151/99   Pulse (!) 109   Temp 97.8 F (36.6 C) (Oral)   Resp 18   Ht 5\' 6"  (1.676 m)   Wt 133.5 kg   SpO2 99%   BMI 47.52 kg/m  FHR : baseline 135 / variability moderate / accelerations present / variable and early decelerations IUPC: replaced Membranes: AROM @ 1214, remains clear Dilation: 6 Effacement (%): 80 Cervical Position: Posterior Station: -2 Presentation: Vertex Exam by:: V Marcellius Montagna   Pitocin 3 mU/min  Assessment/Plan:   29 y.o. G1P0000 [redacted]w[redacted]d  Labor: on Pitocin, increase to achieve adequate ctx Preeclampsia:  no signs or symptoms of toxicity, intake and ouput balanced and BP stable Fetal Wellbeing:  Category I Pain Control:  Epidural I/D:  GBS negative Anticipated MOD:  NSVD  [redacted]w[redacted]d CNM, MSN 07/16/2019 10:19 PM

## 2019-07-16 NOTE — Progress Notes (Signed)
BP are being taken on upper arm.

## 2019-07-16 NOTE — Progress Notes (Signed)
Discussed POC with pt and FOB.  Reviewed C/S possibility and answered questions regarding C/S.  POC is to start pitocin at 9am and see how the baby tolerates pitocin protocol.

## 2019-07-16 NOTE — Progress Notes (Signed)
Change of POC.  Will attempt to place cervical foley bulb.

## 2019-07-16 NOTE — Progress Notes (Signed)
Continue to adjust monitors.  Difficult to trace continuously due to maternal BMI.  Patient reports good fetal movement and audible fetal movement is noted per [place].

## 2019-07-16 NOTE — Progress Notes (Signed)
Fetal tracings reviewed remotely: Had late decelerations earlier, now resolved with position change and )2 Currently Category 1 with good variability and accelerations BP remains under severe range without medication MVU 180-240 without pitocin  SVD expected

## 2019-07-16 NOTE — H&P (Signed)
OB ADMISSION/ HISTORY & PHYSICAL:  Admission Date: 07/16/2019 12:02 AM  Admit Diagnosis: GHTN  Cassandra Tucker is a 29 y.o. female G1P0 at 37.0 weeks who presents for a scheduled IOL due to [facility]. Denies contractions, vb, or lof. Reports adequate fetal movement. Denies headache, epigastric pain, blurred vision. Upon arrival severe range blood pressures were noted. She is BMZ complete from a previous antenatal admission requiring MgSO4 and labetalol PRN.   Prenatal History: G1P0000   EDC : 08/06/2019, by Other [facility]  Prenatal care at [facility] since 12 weeks.   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 - Hx E-coli UTI, TOC neg - hx benign intracranial HTN - anxiety/depression stable off meds - anemia, hgb 10.8,  - GHTN -BMZ series completed 1/16, 1/17   Prenatal Labs: ABO, Rh:   O pos Antibody: NEG (02/03 0024)neg Rubella:   immune RPR:   neg HBsAg:   neg HIV:   neg GBS:   pending 1 hr Glucola : 122 Genetic Screening: normal Panorama Ultrasound: normal anatomy, MCI    Maternal Diabetes: No Genetic Screening: Normal Maternal Ultrasounds/Referrals: Normal, MCI Fetal Ultrasounds or other Referrals:  None Maternal Substance Abuse:  No Significant Maternal Medications:  None Significant Maternal Lab Results:  None Other Comments:  BMZ course completed  Medical / Surgical History :  Past medical history:  Past Medical History:  Diagnosis Date  . Ankle fracture, left 2000  . Anxiety   . Arthritis    left wrist and left ankle  . Back pain   . Bronchitis   . Diabetes (HCC) 01/11/2016   type II  history of  . Family history of adverse reaction to anesthesia    gmother had problems with N/V   . GERD (gastroesophageal reflux disease)   . Headache   . History of kidney stones   . HLD (hyperlipidemia)   . HSV-2 infection   . Hypertension 2013  . Intracranial  hypertension    psuedotumor cebrum  . Joint pain   . Kienbock's disease   . Leg edema   . OSA (obstructive sleep apnea)    cpap - does not know settings   . Papilledema   . Pregnancy induced hypertension   . RLS (restless legs syndrome)   . Sinus tachycardia      Past surgical history:  Past Surgical History:  Procedure Laterality Date  . FRACTURE SURGERY  2005   ankle  . FRACTURE SURGERY  2013   wrist (deteriorating bone)  . LAPAROSCOPIC GASTRIC SLEEVE RESECTION N/A 05/08/2017   Procedure: LAPAROSCOPIC GASTRIC SLEEVE RESECTION;  Surgeon: Berna Bue, MD;  Location: [facility];  Service: General;  Laterality: N/A;  . LAPAROSCOPIC GASTRIC SLEEVE RESECTION  05/08/2017  . UPPER GI ENDOSCOPY  05/08/2017   Procedure: UPPER GI ENDOSCOPY;  Surgeon: Berna Bue, MD;  Location: [facility];  Service: General;;  . urethra stretch    . WRIST SURGERY  2015     Family History:  Family History  Problem Relation Age of Onset  . Heart attack Mother   . Hypertension Mother   . Obesity Mother   . Depression Mother   . Anxiety disorder Mother   . Bipolar disorder Mother   . Cancer Father   . Migraines Neg Hx      Social History:  reports that she quit smoking about 9  months ago. Her smoking use included cigarettes. She smoked 1.00 pack per day. She has never used smokeless tobacco. She reports current alcohol use. She reports current drug use. Drug: Marijuana.   Allergies: Patient has no known allergies.   Current Medications at time of admission:  Medications Prior to Admission  Medication Sig Dispense Refill Last Dose  . albuterol (PROVENTIL HFA;VENTOLIN HFA) 108 (90 Base) MCG/ACT inhaler Inhale 2 puffs into the lungs every 6 (six) hours as needed for wheezing or shortness of breath. (Patient not taking: Reported on 06/28/2018) 1 Inhaler 1   . Albuterol Sulfate 108 (90 Base) MCG/ACT AEPB Inhale 2 puffs into the lungs 2 (two) times daily as needed for wheezing or shortness of  breath.     . budesonide (PULMICORT) 180 MCG/ACT inhaler Inhale 2 puffs into the lungs 2 (two) times daily as needed (shortness of breath).     . dicyclomine (BENTYL) 20 MG tablet Take 1 tablet (20 mg total) by mouth 2 (two) times daily. (Patient not taking: Reported on 06/30/2019) 20 tablet 0   . ferrous sulfate 325 (65 FE) MG tablet Take 325 mg by mouth daily.     . hydrOXYzine (VISTARIL) 50 MG capsule Take 50 mg by mouth 4 (four) times daily as needed for anxiety.     Marland Kitchen NIFEdipine (ADALAT CC) 60 MG 24 hr tablet Take 60 mg by mouth every morning.     . Prenatal Vit-Fe Fumarate-FA (PRENATAL VITAMIN AND MINERAL) 28-0.8 MG TABS Take 1 tablet by mouth daily.     . valACYclovir (VALTREX) 1000 MG tablet Take 1 tablet (1,000 mg total) by mouth daily. 60 tablet 0      Review of Systems: Review of Systems  Constitutional: Negative.   HENT: Negative.   Eyes: Negative.   Respiratory: Negative.   Cardiovascular: Negative.   Gastrointestinal: Negative.   Genitourinary: Negative.   Musculoskeletal: Negative.   Skin: Negative.   Neurological: Negative.   Endo/Heme/Allergies: Negative.   Psychiatric/Behavioral: Negative.     Physical Exam: 98.6, Pulse 122, RR 18 Bps 163/110, 170/99, 151/86  General: AAO x 3, NAD Heart: RRR Lungs:CTAB Abdomen: Gravid, NT, obese Extremities: trace pedal edema Neuro: neg clonus bilat Genitalia / VE: Dilation: Closed Effacement (%): Thick Cervical Position: Posterior Station: Ballotable Presentation: Vertex Exam by:: [place] RN  Bedside US confirms vertex  FHR: 140 BPM, mod variability, + accels, no decels Cat I tracing TOCO: quiet  Labs:    Recent Labs    07/16/19 0024  WBC 11.3*  HGB 10.5*  HCT 33.9*  PLT 259    Assessment/Plan  29 y.o. G1P0000 at [redacted]w[redacted]d, BMI 45, MCI FHT category I  Pre-eclampsia with severe features  - BP severe range on admission.  - Begin Labetalol protocol - admit to LDR for IOL - CBC, CMP, PCR, Uric acid,  LDH pending - Cont pts daily Procardia 60 XL in am -Begin cytotec PV for cervical ripening      Ike Bene CNM, MSN 07/16/2019, 1:28 AM

## 2019-07-16 NOTE — Progress Notes (Signed)
Subjective:    Comfortable w/ epidural, repositioned every hour w/ peanut ball.   Objective:    VS: BP 126/73   Pulse 90   Temp 99.1 F (37.3 C) (Oral)   Resp 15   Ht 5\' 6"  (1.676 m)   Wt 133.5 kg   SpO2 99%   BMI 47.52 kg/m  FHR : baseline 140 / variability moderate / accelerations present / absent decelerations IUPC: MVU 120-150 Membranes: AROM @ 1214, remains clear Dilation: 5.5 Effacement (%): 70 Cervical Position: Posterior Station: -2 Presentation: Vertex Exam by:: [email or web address] CNM  Assessment/Plan:   29 y.o. G1P0000 [redacted]w[redacted]d  Labor: Starting Pitocin Preeclampsia:  no signs or symptoms of toxicity, intake and ouput balanced and labs stable Fetal Wellbeing:  Category I Pain Control:  Epidural I/D:  GBS negative Anticipated MOD:  NSVD   Plan discussed w/ Drs. Rivard and [redacted]w[redacted]d during sign-out.   Su Hilt CNM, MSN 07/16/2019 7:15 PM

## 2019-07-16 NOTE — Progress Notes (Signed)
Lab at bedside for blood draw.

## 2019-07-16 NOTE — Progress Notes (Signed)
Difficult to continuously trace FHT's.  Patient is in constant motion due to pain.

## 2019-07-16 NOTE — Progress Notes (Signed)
Fetal tracing remotely reviewed: Category 1  Baseline 135 bpm Variability moderate No deceleration  Now s/p Cook balloon insertion for cervical ripening

## 2019-07-16 NOTE — Anesthesia Procedure Notes (Signed)
Epidural Patient location during procedure: OB Start time: 07/16/2019 12:40 PM End time: 07/16/2019 12:44 PM  Staffing Anesthesiologist: Beryle Lathe, MD Performed: anesthesiologist   Preanesthetic Checklist Completed: patient identified, IV checked, risks and benefits discussed, monitors and equipment checked, pre-op evaluation and timeout performed  Epidural Patient position: sitting Prep: DuraPrep Patient monitoring: continuous pulse ox and blood pressure Approach: midline Location: L3-L4 Injection technique: LOR saline  Needle:  Needle type: Tuohy  Needle gauge: 17 G Needle length: 9 cm Needle insertion depth: 8 cm Catheter size: 19 Gauge Catheter at skin depth: 13 cm Test dose: negative and Other (1% lidocaine)  Assessment Events: blood not aspirated  Additional Notes Patient identified. Risks including, but not limited to, bleeding, infection, nerve damage, paralysis, inadequate analgesia, blood pressure changes, nausea, vomiting, allergic reaction, postpartum back pain, itching, and headache were discussed. Patient expressed understanding and wished to proceed. Sterile prep and drape, including hand hygiene, mask, and sterile gloves were used. The patient was positioned and the spine was prepped. The skin was anesthetized with lidocaine. No paraesthesia or other complication noted. The patient did not experience any signs of intravascular injection such as tinnitus or metallic taste in mouth, nor signs of intrathecal spread such as rapid motor block. Please see nursing notes for vital signs. The patient tolerated the procedure well.   Leslye Peer, MDReason for block:procedure for pain

## 2019-07-16 NOTE — Progress Notes (Addendum)
Subjective:    Comfortable w/ epidural   Objective:    VS: BP 111/60   Pulse 88   Temp 98.7 F (37.1 C) (Oral)   Resp 15   Ht 5\' 6"  (1.676 m)   Wt 133.5 kg   SpO2 99%   BMI 47.52 kg/m  FHR : baseline 140 / variability moderate / accelerations absent / early and late decelerations IUPC: MVU 170 Membranes: AROM @ 1214, clear Dilation: 5.5 Effacement (%): 70 Cervical Position: Posterior Station: -2 Presentation: Vertex Exam by:: [email or web address] RNC  Assessment/Plan:   29 y.o. G1P0000 [redacted]w[redacted]d  Labor: Progressing normally Preeclampsia:  no signs or symptoms of toxicity and labs stable Fetal Wellbeing:  Category II, late decels resolved w/ O2 and position change. Pain Control:  Epidural I/D:  GBS negative Anticipated MOD:  NSVD  [redacted]w[redacted]d CNM, MSN 07/16/2019 4:18 PM

## 2019-07-16 NOTE — Progress Notes (Signed)
Patient up to bathroom.  Difficult to monitor BP/FHR due to patient up to bathroom and BMI.  Standing at bathroom for assistance.

## 2019-07-16 NOTE — Progress Notes (Signed)
Informed by RN of severe range BP requiring Labetalol 20 and 40 mg.  07/16/19 1147  --  92  --  18  143/74Abnormal   --  --  --  --  --  -- SE   07/16/19 1132  --  100  --  18  158/85Abnormal   --  --  --  --  --  -- SE   07/16/19 1115  --  98  --  20  155/133Abnormal   --  --  --  --  --  -- SE   07/16/19 1102  --  97  --  20  151/94Abnormal   --  --  --  --  --  -- SE   07/16/19 1047  --  102Abnormal   --  20  157/93Abnormal   --  --  --  --  --  -- SE   07/16/19 1045  --  105Abnormal   --  20  185/97Abnormal   --  --  --  --  --  -- SE   07/16/19 1015  --  108Abnormal   --  18  174/118Abnormal   --  --  --  --  --  -- SE   07/16/19 1000  --  113Abnormal   --  18  159/113Abnormal   --  --  --  --  --  -- SE

## 2019-07-17 ENCOUNTER — Encounter (HOSPITAL_COMMUNITY): Payer: Self-pay | Admitting: Obstetrics & Gynecology

## 2019-07-17 ENCOUNTER — Encounter (HOSPITAL_COMMUNITY)
Admission: AD | Disposition: A | Discharge status: Home / Self Care | Payer: Self-pay | Source: Home / Self Care | Attending: Obstetrics & Gynecology

## 2019-07-17 LAB — CBC WITH DIFFERENTIAL/PLATELET
Abs Immature Granulocytes: 0.08 10*3/uL — ABNORMAL HIGH (ref 0.00–0.07)
Basophils Absolute: 0 10*3/uL (ref 0.0–0.1)
Basophils Relative: 0 %
Eosinophils Absolute: 0 10*3/uL (ref 0.0–0.5)
Eosinophils Relative: 0 %
HCT: 35.6 % — ABNORMAL LOW (ref 36.0–46.0)
Hemoglobin: 11 g/dL — ABNORMAL LOW (ref 12.0–15.0)
Immature Granulocytes: 1 %
Lymphocytes Relative: 10 %
Lymphs Abs: 1.6 10*3/uL (ref 0.7–4.0)
MCH: 25.2 pg — ABNORMAL LOW (ref 26.0–34.0)
MCHC: 30.9 g/dL (ref 30.0–36.0)
MCV: 81.7 fL (ref 80.0–100.0)
Monocytes Absolute: 0.8 10*3/uL (ref 0.1–1.0)
Monocytes Relative: 5 %
Neutro Abs: 12.6 10*3/uL — ABNORMAL HIGH (ref 1.7–7.7)
Neutrophils Relative %: 84 %
Platelets: 204 10*3/uL (ref 150–400)
RBC: 4.36 MIL/uL (ref 3.87–5.11)
RDW: 14.1 % (ref 11.5–15.5)
WBC: 15.1 10*3/uL — ABNORMAL HIGH (ref 4.0–10.5)
nRBC: 0 % (ref 0.0–0.2)

## 2019-07-17 LAB — COMPREHENSIVE METABOLIC PANEL
ALT: 12 U/L (ref 0–44)
AST: 15 U/L (ref 15–41)
Albumin: 2.5 g/dL — ABNORMAL LOW (ref 3.5–5.0)
Alkaline Phosphatase: 91 U/L (ref 38–126)
Anion gap: 10 (ref 5–15)
BUN: <5 mg/dL — ABNORMAL LOW (ref 6–20)
CO2: 22 mmol/L (ref 22–32)
Calcium: 9.3 mg/dL (ref 8.9–10.3)
Chloride: 107 mmol/L (ref 98–111)
Creatinine, Ser: 0.53 mg/dL (ref 0.44–1.00)
GFR calc Af Amer: >60 mL/min (ref >60)
GFR calc non Af Amer: >60 mL/min (ref >60)
Glucose, Bld: 103 mg/dL — ABNORMAL HIGH (ref 70–99)
Potassium: 4 mmol/L (ref 3.5–5.1)
Sodium: 139 mmol/L (ref 135–145)
Total Bilirubin: 0.9 mg/dL (ref 0.3–1.2)
Total Protein: 5.9 g/dL — ABNORMAL LOW (ref 6.5–8.1)

## 2019-07-17 LAB — CBC
HCT: 32.4 % — ABNORMAL LOW (ref 36.0–46.0)
Hemoglobin: 10 g/dL — ABNORMAL LOW (ref 12.0–15.0)
MCH: 25.1 pg — ABNORMAL LOW (ref 26.0–34.0)
MCHC: 30.9 g/dL (ref 30.0–36.0)
MCV: 81.2 fL (ref 80.0–100.0)
Platelets: 207 10*3/uL (ref 150–400)
RBC: 3.99 MIL/uL (ref 3.87–5.11)
RDW: 14.3 % (ref 11.5–15.5)
WBC: 16.3 10*3/uL — ABNORMAL HIGH (ref 4.0–10.5)
nRBC: 0 % (ref 0.0–0.2)

## 2019-07-17 LAB — LACTATE DEHYDROGENASE: LDH: 103 U/L (ref 98–192)

## 2019-07-17 LAB — URIC ACID: Uric Acid, Serum: 4.9 mg/dL (ref 2.5–7.1)

## 2019-07-17 SURGERY — Surgical Case
Anesthesia: Epidural | Wound class: Clean Contaminated

## 2019-07-17 MED ORDER — PHENYLEPHRINE HCL (PRESSORS) 10 MG/ML IV SOLN
INTRAVENOUS | Status: DC | PRN
  Administered 2019-07-17 (×2): 80 ug via INTRAVENOUS

## 2019-07-17 MED ORDER — FENTANYL CITRATE (PF) 100 MCG/2ML IJ SOLN: 50.0000 ug | INTRAMUSCULAR | Status: DC | PRN

## 2019-07-17 MED ORDER — LACTATED RINGERS AMNIOINFUSION: INTRAVENOUS | Status: DC

## 2019-07-17 MED ORDER — DEXAMETHASONE SODIUM PHOSPHATE 10 MG/ML IJ SOLN
INTRAMUSCULAR | Status: AC
  Filled 2019-07-17: qty 1

## 2019-07-17 MED ORDER — LACTATED RINGERS IV SOLN: INTRAVENOUS | Status: DC

## 2019-07-17 MED ORDER — DIPHENHYDRAMINE HCL 25 MG PO CAPS: 25.0000 mg | ORAL_CAPSULE | ORAL | Status: DC | PRN

## 2019-07-17 MED ORDER — NALBUPHINE HCL 10 MG/ML IJ SOLN: 5.0000 mg | Freq: Once | INTRAMUSCULAR | Status: DC | PRN

## 2019-07-17 MED ORDER — SIMETHICONE 80 MG PO CHEW
80.0000 mg | CHEWABLE_TABLET | Freq: Three times a day (TID) | ORAL | Status: DC
  Administered 2019-07-17 – 2019-07-20 (×8): 80 mg via ORAL
  Filled 2019-07-17 (×8): qty 1

## 2019-07-17 MED ORDER — TETANUS-DIPHTH-ACELL PERTUSSIS 5-2.5-18.5 LF-MCG/0.5 IM SUSP: 0.5000 mL | Freq: Once | INTRAMUSCULAR | Status: DC

## 2019-07-17 MED ORDER — ZOLPIDEM TARTRATE 5 MG PO TABS: 5.0000 mg | ORAL_TABLET | Freq: Every evening | ORAL | Status: DC | PRN

## 2019-07-17 MED ORDER — OXYCODONE HCL 5 MG PO TABS
5.0000 mg | ORAL_TABLET | ORAL | Status: DC | PRN
  Administered 2019-07-20: 10 mg via ORAL
  Filled 2019-07-17: qty 2

## 2019-07-17 MED ORDER — KETOROLAC TROMETHAMINE 30 MG/ML IJ SOLN: 30.0000 mg | Freq: Four times a day (QID) | INTRAMUSCULAR | Status: AC | PRN

## 2019-07-17 MED ORDER — DIPHENHYDRAMINE HCL 25 MG PO CAPS: 25.0000 mg | ORAL_CAPSULE | Freq: Four times a day (QID) | ORAL | Status: DC | PRN

## 2019-07-17 MED ORDER — MENTHOL 3 MG MT LOZG: 1.0000 | LOZENGE | OROMUCOSAL | Status: DC | PRN

## 2019-07-17 MED ORDER — SOD CITRATE-CITRIC ACID 500-334 MG/5ML PO SOLN
30.0000 mL | ORAL | Status: AC
  Administered 2019-07-17: 30 mL via ORAL

## 2019-07-17 MED ORDER — MORPHINE SULFATE (PF) 0.5 MG/ML IJ SOLN
INTRAMUSCULAR | Status: AC
  Filled 2019-07-17: qty 10

## 2019-07-17 MED ORDER — SIMETHICONE 80 MG PO CHEW
80.0000 mg | CHEWABLE_TABLET | ORAL | Status: DC
  Administered 2019-07-17 – 2019-07-19 (×3): 80 mg via ORAL
  Filled 2019-07-17 (×3): qty 1

## 2019-07-17 MED ORDER — DEXTROSE 5 % IV SOLN
INTRAVENOUS | Status: AC
  Filled 2019-07-17: qty 3000

## 2019-07-17 MED ORDER — NALBUPHINE HCL 10 MG/ML IJ SOLN: 5.0000 mg | INTRAMUSCULAR | Status: DC | PRN

## 2019-07-17 MED ORDER — OXYTOCIN 40 UNITS IN NORMAL SALINE INFUSION - SIMPLE MED: 2.5000 [IU]/h | INTRAVENOUS | Status: AC

## 2019-07-17 MED ORDER — SODIUM CHLORIDE 0.9% FLUSH: 3.0000 mL | INTRAVENOUS | Status: DC | PRN

## 2019-07-17 MED ORDER — LACTATED RINGERS IV SOLN: 500.0000 mL | Freq: Once | INTRAVENOUS | Status: DC

## 2019-07-17 MED ORDER — KETOROLAC TROMETHAMINE 30 MG/ML IJ SOLN
INTRAMUSCULAR | Status: AC
  Filled 2019-07-17: qty 1

## 2019-07-17 MED ORDER — EPHEDRINE 5 MG/ML INJ: 10.0000 mg | INTRAVENOUS | Status: DC | PRN

## 2019-07-17 MED ORDER — PHENYLEPHRINE 40 MCG/ML (10ML) SYRINGE FOR IV PUSH (FOR BLOOD PRESSURE SUPPORT): 80.0000 ug | PREFILLED_SYRINGE | INTRAVENOUS | Status: DC | PRN

## 2019-07-17 MED ORDER — SENNOSIDES-DOCUSATE SODIUM 8.6-50 MG PO TABS
2.0000 | ORAL_TABLET | ORAL | Status: DC
  Administered 2019-07-17 – 2019-07-19 (×3): 2 via ORAL
  Filled 2019-07-17 (×3): qty 2

## 2019-07-17 MED ORDER — NALOXONE HCL 0.4 MG/ML IJ SOLN: 0.4000 mg | INTRAMUSCULAR | Status: DC | PRN

## 2019-07-17 MED ORDER — DIPHENHYDRAMINE HCL 50 MG/ML IJ SOLN: 12.5000 mg | INTRAMUSCULAR | Status: DC | PRN

## 2019-07-17 MED ORDER — TERBUTALINE SULFATE 1 MG/ML IJ SOLN: 0.2500 mg | Freq: Once | INTRAMUSCULAR | Status: DC | PRN

## 2019-07-17 MED ORDER — SOD CITRATE-CITRIC ACID 500-334 MG/5ML PO SOLN: 30.0000 mL | Freq: Once | ORAL | Status: DC

## 2019-07-17 MED ORDER — ENOXAPARIN SODIUM 80 MG/0.8ML ~~LOC~~ SOLN
70.0000 mg | SUBCUTANEOUS | Status: DC
  Administered 2019-07-17 – 2019-07-19 (×3): 70 mg via SUBCUTANEOUS
  Filled 2019-07-17 (×3): qty 0.7

## 2019-07-17 MED ORDER — DIPHENHYDRAMINE HCL 50 MG/ML IJ SOLN
INTRAMUSCULAR | Status: AC
  Filled 2019-07-17: qty 1

## 2019-07-17 MED ORDER — KETOROLAC TROMETHAMINE 30 MG/ML IJ SOLN
30.0000 mg | Freq: Four times a day (QID) | INTRAMUSCULAR | Status: AC | PRN
  Administered 2019-07-17 – 2019-07-18 (×3): 30 mg via INTRAVENOUS
  Filled 2019-07-17 (×2): qty 1

## 2019-07-17 MED ORDER — SIMETHICONE 80 MG PO CHEW: 80.0000 mg | CHEWABLE_TABLET | ORAL | Status: DC | PRN

## 2019-07-17 MED ORDER — ONDANSETRON HCL 4 MG/2ML IJ SOLN: 4.0000 mg | Freq: Three times a day (TID) | INTRAMUSCULAR | Status: DC | PRN

## 2019-07-17 MED ORDER — OXYTOCIN 40 UNITS IN NORMAL SALINE INFUSION - SIMPLE MED
INTRAVENOUS | Status: AC
  Filled 2019-07-17: qty 1000

## 2019-07-17 MED ORDER — ONDANSETRON HCL 4 MG/2ML IJ SOLN
INTRAMUSCULAR | Status: AC
  Filled 2019-07-17: qty 2

## 2019-07-17 MED ORDER — DEXAMETHASONE SODIUM PHOSPHATE 10 MG/ML IJ SOLN
INTRAMUSCULAR | Status: DC | PRN
  Administered 2019-07-17: 5 mg via INTRAVENOUS

## 2019-07-17 MED ORDER — NIFEDIPINE ER OSMOTIC RELEASE 90 MG PO TB24
90.0000 mg | ORAL_TABLET | Freq: Every day | ORAL | Status: DC
  Administered 2019-07-18 – 2019-07-20 (×3): 90 mg via ORAL
  Filled 2019-07-17 (×6): qty 1

## 2019-07-17 MED ORDER — PHENYLEPHRINE 40 MCG/ML (10ML) SYRINGE FOR IV PUSH (FOR BLOOD PRESSURE SUPPORT)
PREFILLED_SYRINGE | INTRAVENOUS | Status: AC
  Filled 2019-07-17: qty 10

## 2019-07-17 MED ORDER — DEXTROSE 5 % IV SOLN
3.0000 g | INTRAVENOUS | Status: AC
  Administered 2019-07-17: 3 g via INTRAVENOUS

## 2019-07-17 MED ORDER — DIPHENHYDRAMINE HCL 50 MG/ML IJ SOLN
INTRAMUSCULAR | Status: DC | PRN
  Administered 2019-07-17: 12.5 mg via INTRAVENOUS

## 2019-07-17 MED ORDER — OXYTOCIN 40 UNITS IN NORMAL SALINE INFUSION - SIMPLE MED: 1.0000 m[IU]/min | INTRAVENOUS | Status: DC

## 2019-07-17 MED ORDER — WITCH HAZEL-GLYCERIN EX PADS: 1.0000 "application " | MEDICATED_PAD | CUTANEOUS | Status: DC | PRN

## 2019-07-17 MED ORDER — MORPHINE SULFATE (PF) 0.5 MG/ML IJ SOLN
INTRAMUSCULAR | Status: DC | PRN
  Administered 2019-07-17: 3 mg via EPIDURAL

## 2019-07-17 MED ORDER — FENTANYL-BUPIVACAINE-NACL 0.5-0.125-0.9 MG/250ML-% EP SOLN: 12.0000 mL/h | EPIDURAL | Status: DC | PRN

## 2019-07-17 MED ORDER — COCONUT OIL OIL: 1.0000 "application " | TOPICAL_OIL | Status: DC | PRN

## 2019-07-17 MED ORDER — FENTANYL CITRATE (PF) 100 MCG/2ML IJ SOLN: 25.0000 ug | INTRAMUSCULAR | Status: DC | PRN

## 2019-07-17 MED ORDER — PRENATAL MULTIVITAMIN CH
1.0000 | ORAL_TABLET | Freq: Every day | ORAL | Status: DC
  Administered 2019-07-18 – 2019-07-20 (×3): 1 via ORAL
  Filled 2019-07-17 (×4): qty 1

## 2019-07-17 MED ORDER — MEPERIDINE HCL 25 MG/ML IJ SOLN: 6.2500 mg | INTRAMUSCULAR | Status: DC | PRN

## 2019-07-17 MED ORDER — ENOXAPARIN SODIUM 300 MG/3ML IJ SOLN: 1.5000 mg/kg | INTRAMUSCULAR | Status: DC

## 2019-07-17 MED ORDER — NALOXONE HCL 4 MG/10ML IJ SOLN
1.0000 ug/kg/h | INTRAVENOUS | Status: DC | PRN
  Filled 2019-07-17: qty 5

## 2019-07-17 MED ORDER — DIBUCAINE (PERIANAL) 1 % EX OINT: 1.0000 "application " | TOPICAL_OINTMENT | CUTANEOUS | Status: DC | PRN

## 2019-07-17 MED ORDER — LIDOCAINE-EPINEPHRINE (PF) 2 %-1:200000 IJ SOLN
INTRAMUSCULAR | Status: DC | PRN
  Administered 2019-07-17 (×4): 5 mL via EPIDURAL

## 2019-07-17 MED ORDER — NIFEDIPINE ER OSMOTIC RELEASE 30 MG PO TB24
30.0000 mg | ORAL_TABLET | Freq: Once | ORAL | Status: AC
  Administered 2019-07-17: 30 mg via ORAL
  Filled 2019-07-17: qty 1

## 2019-07-17 SURGICAL SUPPLY — 43 items
APL SKNCLS STERI-STRIP NONHPOA (GAUZE/BANDAGES/DRESSINGS) ×1
BENZOIN TINCTURE PRP APPL 2/3 (GAUZE/BANDAGES/DRESSINGS) ×2 IMPLANT
CHLORAPREP W/TINT 26ML (MISCELLANEOUS) ×2 IMPLANT
CLAMP CORD UMBIL (MISCELLANEOUS) IMPLANT
CLOSURE STERI STRIP 1/2 X4 (GAUZE/BANDAGES/DRESSINGS) ×1 IMPLANT
CLOTH BEACON ORANGE TIMEOUT ST (SAFETY) ×2 IMPLANT
DRAIN JACKSON PRT FLT 7MM (DRAIN) ×1 IMPLANT
DRSG OPSITE POSTOP 4X10 (GAUZE/BANDAGES/DRESSINGS) ×2 IMPLANT
ELECT REM PT RETURN 9FT ADLT (ELECTROSURGICAL) ×2
ELECTRODE REM PT RTRN 9FT ADLT (ELECTROSURGICAL) ×1 IMPLANT
EVACUATOR SILICONE 100CC (DRAIN) ×1 IMPLANT
EXTENDER TRAXI PANNICULUS (MISCELLANEOUS) IMPLANT
EXTRACTOR VACUUM M CUP 4 TUBE (SUCTIONS) IMPLANT
GLOVE BIO SURGEON STRL SZ7.5 (GLOVE) ×2 IMPLANT
GLOVE BIOGEL PI IND STRL 7.0 (GLOVE) ×1 IMPLANT
GLOVE BIOGEL PI IND STRL 7.5 (GLOVE) ×1 IMPLANT
GLOVE BIOGEL PI INDICATOR 7.0 (GLOVE) ×1
GLOVE BIOGEL PI INDICATOR 7.5 (GLOVE) ×1
GOWN STRL REUS W/TWL LRG LVL3 (GOWN DISPOSABLE) ×4 IMPLANT
KIT ABG SYR 3ML LUER SLIP (SYRINGE) IMPLANT
NDL HYPO 25X5/8 SAFETYGLIDE (NEEDLE) IMPLANT
NEEDLE HYPO 25X5/8 SAFETYGLIDE (NEEDLE) IMPLANT
NS IRRIG 1000ML POUR BTL (IV SOLUTION) ×2 IMPLANT
PACK C SECTION WH (CUSTOM PROCEDURE TRAY) ×2 IMPLANT
PAD OB MATERNITY 4.3X12.25 (PERSONAL CARE ITEMS) ×2 IMPLANT
PENCIL SMOKE EVAC W/HOLSTER (ELECTROSURGICAL) ×2 IMPLANT
RETRACTOR TRAXI PANNICULUS (MISCELLANEOUS) IMPLANT
RTRCTR C-SECT PINK 25CM LRG (MISCELLANEOUS) ×2 IMPLANT
STRIP CLOSURE SKIN 1/2X4 (GAUZE/BANDAGES/DRESSINGS) ×2 IMPLANT
SUT CHROMIC 2 0 CT 1 (SUTURE) ×2 IMPLANT
SUT MNCRL AB 3-0 PS2 27 (SUTURE) ×2 IMPLANT
SUT PLAIN 2 0 XLH (SUTURE) ×2 IMPLANT
SUT SILK 0 SH 30 (SUTURE) ×1 IMPLANT
SUT VIC AB 0 CT1 36 (SUTURE) ×2 IMPLANT
SUT VIC AB 0 CTX 36 (SUTURE) ×6
SUT VIC AB 0 CTX36XBRD ANBCTRL (SUTURE) ×3 IMPLANT
SUT VIC AB 2-0 SH 27 (SUTURE) ×4
SUT VIC AB 2-0 SH 27XBRD (SUTURE) ×2 IMPLANT
TOWEL OR 17X24 6PK STRL BLUE (TOWEL DISPOSABLE) ×2 IMPLANT
TRAXI PANNICULUS EXTENDER (MISCELLANEOUS) ×1
TRAXI PANNICULUS RETRACTOR (MISCELLANEOUS) ×1
TRAY FOLEY W/BAG SLVR 14FR LF (SET/KITS/TRAYS/PACK) ×2 IMPLANT
WATER STERILE IRR 1000ML POUR (IV SOLUTION) ×3 IMPLANT

## 2019-07-17 NOTE — Lactation Note (Addendum)
This note was copied from a baby's chart. Lactation Consultation Note  Patient Name: Cassandra Tucker Today's Date: 07/17/2019 Reason for consult: Initial assessment;Primapara;1st time breastfeeding;NICU baby;Early term 37-38.6wks  LC in to visit with P1 Mom of 9 hr old ET infant that transitioned to the NICU for low CBGs.  Baby had breastfed 5 minutes earlier this am, and then given a bottle of 22 cal formula and took 7 ml.    Baby resting on warmer and has an IV.  Encouraged STS as much as possible.    Assisted with first pumping.  Mom has nipple piercing.  Recommended she take the piercings out, but Mom wanted to try with them first.  27 mm flanges used due to "bar bell" type piercing.  Nipple moving freely and no pain felt.  Mom expressed 5 ml of colostrum.  RN to feed this to baby first.    Demonstrated how to disassemble pump parts, wash, rinse and air dry parts in separate bin.    Mom has WIC in [facility], faxed referral for pump on discharge.    Lactation brochure and NICU booklet given to Mom to look at.    Maternal Data Formula Feeding for Exclusion: Yes Reason for exclusion: Admission to Intensive Care Unit (ICU) post-partum Has patient been taught Hand Expression?: Yes Does the patient have breastfeeding experience prior to this delivery?: No  Interventions Interventions: Breast feeding basics reviewed;Skin to skin;Breast massage;Hand express;DEBP  Lactation Tools Discussed/Used Tools: Pump;Bottle Breast pump type: Double-Electric Breast Pump WIC Program: Yes Pump Review: Setup, frequency, and cleaning;Milk Storage Initiated by:: Erby Pian RN IBCLC Date initiated:: 07/17/19   Consult Status Consult Status: Follow-up Date: 07/18/19 Follow-up type: In-patient    [name] 07/17/2019, 4:10 PM

## 2019-07-17 NOTE — Transfer of Care (Signed)
Immediate Anesthesia Transfer of Care Note  Patient: Cassandra Tucker  Procedure(s) Performed: CESAREAN SECTION (N/A )  Patient Location: PACU  Anesthesia Type:Epidural  Level of Consciousness: awake, alert  and oriented  Airway & Oxygen Therapy: Patient Spontanous Breathing  Post-op Assessment: Report given to RN and Post -op Vital signs reviewed and stable  Post vital signs: Reviewed and stable  Last Vitals:  Vitals Value Taken Time  BP 100/87 07/17/19 0755  Temp    Pulse 98 07/17/19 0758  Resp 21 07/17/19 0758  SpO2 100 % 07/17/19 0758  Vitals shown include unvalidated device data.  Last Pain:  Vitals:   07/17/19 0524  TempSrc: Oral  PainSc:          Complications: No apparent anesthesia complications

## 2019-07-17 NOTE — Progress Notes (Signed)
BP reviewed and discussed with Dr. Normand Sloop.  Will increase Procardia XL 60 mg to 90 mg daily.  Will give another 30 mg tonight.

## 2019-07-17 NOTE — Progress Notes (Signed)
Tachysystole w/ late decelerations. MVU 280-300. Pitocin dc'd, O2 initiated, position changed.  Fetal surveillance returned to Cat 1 w/ interventions.

## 2019-07-17 NOTE — Op Note (Addendum)
Cesarean Section Procedure Note  Indications: P0 at 37 1/7wks undergoing IOL with fetal intolerance of labor and FTP.  Pre-operative Diagnosis: 1.37 1/7wks 2.Fetal intolerance to labor 3.Failure to progress   Post-operative Diagnosis: 1.37 1/7wks 2.Fetal intolerance to labor 3.Failure to progress  Procedure: Primary LTCS  Surgeon: Osborn Coho, MD    Assistants: Rhea Pink, CNM  Anesthesia: Regional  Anesthesiologist: Lewie Loron, MD   Procedure Details  The patient was taken to the operating room secondary to fetal intolerance of labor and FTP after the risks, benefits, complications, treatment options, and expected outcomes were discussed with the patient.  The patient concurred with the proposed plan, giving informed consent which was signed and witnessed. The patient was taken to Operating Room C, identified as Cassandra Tucker and the procedure verified as C-Section Delivery. A Time Out was held and the above information confirmed.  After induction of anesthesia by obtaining a spinal, the patient was prepped and draped in the usual sterile manner. A Pfannenstiel skin incision was made and carried down through the subcutaneous tissue to the underlying layer of fascia.  The fascia was incised bilaterally and extended transversely bilaterally with the Mayo scissors. Kocher clamps were placed on the inferior aspect of the fascial incision and the underlying rectus muscle was separated from the fascia. The same was done on the superior aspect of the fascial incision.  The peritoneum was identified, entered bluntly and extended manually.  An Alexis self-retaining retractor was placed.  The utero-vesical peritoneal reflection was incised transversely and the bladder flap was bluntly freed from the lower uterine segment. A low transverse uterine incision was made with the scalpel and extended bilaterally with the bandage scissors.  The infant was delivered in vertex position without  difficulty.  After the umbilical cord was clamped and cut, the infant was handed to the awaiting pediatricians.  Cord blood was obtained for evaluation.  The placenta was removed intact and appeared to be within normal limits. The uterus was cleared of all clots and debris. The uterine incision was closed with running interlocking sutures of 0 Vicryl and a second imbricating layer was performed as well.   Bilateral tubes and ovaries appeared to be within normal limits.  Good hemostasis was noted.  Copious irrigation was performed until clear.  The peritoneum was repaired with 2-0 chromic via a running suture.  The fascia was reapproximated with a running suture of 0 Vicryl. The subcutaneous tissue was reapproximated with 3 interrupted sutures of 2-0 plain.  A J-P drain was placed and sutured in place with The skin was reapproximated with a subcuticular suture of 3-0 monocryl.  Steristrips were applied.  Instrument, sponge, and needle counts were correct prior to abdominal closure and at the conclusion of the case.  The patient was awaiting transfer to the recovery room in good condition.  Findings: Live female infant with Apgars 1 at one minute and 9 at five minutes.  Normal appearing bilateral ovaries and fallopian tubes were noted.  Delivered from LOP presentation.  Arterial cord gas 7.14.  Estimated Blood Loss:  294 ml         Drains: foley to gravity 75cc         Total IV Fluids: 2300 ml         Specimens to Pathology: Placenta         Complications:  None; patient tolerated the procedure well.         Disposition: PACU - hemodynamically stable.  Condition: stable  Attending Attestation: I performed the procedure.

## 2019-07-17 NOTE — Anesthesia Postprocedure Evaluation (Signed)
Anesthesia Post Note  Patient: Cassandra Tucker  Procedure(s) Performed: CESAREAN SECTION (N/A )     Patient location during evaluation: PACU Anesthesia Type: Epidural Level of consciousness: sedated and patient cooperative Pain management: pain level controlled Vital Signs Assessment: post-procedure vital signs reviewed and stable Respiratory status: spontaneous breathing Cardiovascular status: stable Anesthetic complications: no    Last Vitals:  Vitals:   07/17/19 1255 07/17/19 1305  BP: (!) 160/100 (!) 148/96  Pulse: (!) 102   Resp: 16   Temp: 36.6 C   SpO2: 98%     Last Pain:  Vitals:   07/17/19 1255  TempSrc: Oral  PainSc: 0-No pain                 Lewie Loron

## 2019-07-17 NOTE — Progress Notes (Signed)
Subjective:    Resting, denies pain, reports feeling mild lower abd pressure earlier, but none currently. Comfortable w/ epidural.   Objective:    VS: BP 131/72   Pulse 91   Temp 98.5 F (36.9 C) (Oral)   Resp 18   Ht 5\' 6"  (1.676 m)   Wt 133.5 kg   SpO2 99%   BMI 47.52 kg/m  FHR : baseline 135 / variability moderate / accelerations absent / early decelerations IUPC: MVU 140 Membranes: AROM 07/16/19 @ 1214, fluid remain clear  Dilation: 6 Effacement (%): 80 Cervical Position: Posterior Station: -2 Presentation: Vertex Exam by:: [email or web address], CNM  Assessment/Plan:   29 y.o. G1P0000 [redacted]w[redacted]d  Labor: Protracted active phase, restarting Pitocin Preeclampsia:  no signs or symptoms of toxicity, intake and ouput balanced and no severe range BP, negative for neuro S/S Fetal Wellbeing:  Category I Pain Control:  Epidural I/D:  GBS negative Anticipated MOD:  hopeful for NSVD   Dr. [redacted]w[redacted]d notified of exam and restarting Pitocin.   Su Hilt CNM, MSN 07/17/2019 1:53 AM

## 2019-07-17 NOTE — Progress Notes (Addendum)
Subjective:    No cervical change noted despite adequate contractions. Discussed C/S and pt and agrees. Informed consent presented by Dr. Su Hilt.    Objective:    VS: BP (!) 155/79   Pulse (!) 103   Temp 98.4 F (36.9 C) (Oral)   Resp 18   Ht 5\' 6"  (1.676 m)   Wt 133.5 kg   SpO2 99%   BMI 47.52 kg/m  FHR : baseline 135 / variability moderate / accelerations 10x10 / early and late decelerations IUPC: MVU 220 Membranes: AROM since 07/16/19@ 1214  Dilation: 6 Effacement (%): 80 Cervical Position: Posterior Station: -2 Presentation: Vertex Exam by:: [email or web address]   Assessment/Plan:   29 y.o. G1P0000 [redacted]w[redacted]d IOL for GHTN  Labor: arrest of dilation. Plan for C/S for arrest of dilation and fetal intolerance to labor Preeclampsia:  no signs or symptoms of toxicity, intake and ouput balanced and labs stable Fetal Wellbeing:  Category II Cat 2 tracing not improved w/ amnioinfusion, position changes, IV fluid bolus Pain Control:  Epidural I/D:  GBS negative Anticipated MOD:  Primary C/S    [redacted]w[redacted]d CNM, MSN 07/17/2019 5:49 AM  Tracing reviewed upon leaving hospital at 0215 and noted to be cat 1.  Review of tracing at 0530 remotely by me noted to be cat 2 with deep decels at about 0300.  I was not notified.  I called CNM upon my review and recommended a c-section but to check to see if pt had changed.  Patient was unchanged and ready to proceed with c-section.  Upon my arrival, I discussed c-section with patient and the risk benefits and alternatives including but not limited to bleeding infection and injury.  Patient did not have any questions and consent was signed and witnessed.

## 2019-07-18 LAB — CBC
HCT: 27.4 % — ABNORMAL LOW (ref 36.0–46.0)
Hemoglobin: 8.4 g/dL — ABNORMAL LOW (ref 12.0–15.0)
MCH: 25.1 pg — ABNORMAL LOW (ref 26.0–34.0)
MCHC: 30.7 g/dL (ref 30.0–36.0)
MCV: 82 fL (ref 80.0–100.0)
Platelets: 161 10*3/uL (ref 150–400)
RBC: 3.34 MIL/uL — ABNORMAL LOW (ref 3.87–5.11)
RDW: 14.6 % (ref 11.5–15.5)
WBC: 10.7 10*3/uL — ABNORMAL HIGH (ref 4.0–10.5)
nRBC: 0 % (ref 0.0–0.2)

## 2019-07-18 MED ORDER — POLYSACCHARIDE IRON COMPLEX 150 MG PO CAPS
150.0000 mg | ORAL_CAPSULE | Freq: Every day | ORAL | Status: DC
  Administered 2019-07-19 – 2019-07-20 (×2): 150 mg via ORAL
  Filled 2019-07-18 (×4): qty 1

## 2019-07-18 MED ORDER — ACETAMINOPHEN 500 MG PO TABS
1000.0000 mg | ORAL_TABLET | Freq: Four times a day (QID) | ORAL | Status: DC
  Administered 2019-07-18 – 2019-07-20 (×7): 1000 mg via ORAL
  Filled 2019-07-18 (×8): qty 2

## 2019-07-18 MED ORDER — MAGNESIUM OXIDE 400 (241.3 MG) MG PO TABS
400.0000 mg | ORAL_TABLET | Freq: Every day | ORAL | Status: DC
  Administered 2019-07-19 – 2019-07-20 (×2): 400 mg via ORAL
  Filled 2019-07-18 (×2): qty 1

## 2019-07-18 MED ORDER — SODIUM CHLORIDE 0.9 % IV SOLN
510.0000 mg | Freq: Once | INTRAVENOUS | Status: AC
  Administered 2019-07-18: 510 mg via INTRAVENOUS
  Filled 2019-07-18: qty 17

## 2019-07-18 NOTE — Progress Notes (Signed)
Patient ID: Cassandra Tucker, female   DOB: 1991/04/10, 29 y.o.   MRN: [id number] Subjective: POD# 1 Live born female  Birth Weight: 5 lb 10.8 oz (2575 g) APGAR: 1, 9 In NICU d/t glucose instability, patient rooming in couplet care Newborn Delivery   Birth date/time: 07/17/2019 06:53:00 Delivery type: C-Section, Low Transverse Trial of labor: Yes C-section categorization: Primary     Baby name: Amore Delivering provider: Everett Graff    Feeding: breast and bottle  Pain control at delivery: Epidural   Reports feeling well, denies PEC s/sx  Patient reports tolerating PO.   Breast symptoms: pumping colostrum, baby not latching well yet. Pain controlled with PO meds Denies HA/SOB/C/P/N/V/dizziness. Flatus present. She reports vaginal bleeding as normal, without clots.  She is ambulating, urinating without difficulty.     Objective:   VS:    Vitals:   07/17/19 1715 07/17/19 2127 07/18/19 0138 07/18/19 0539  BP:  (!) 141/80 140/87 134/85  Pulse:  (!) 113 (!) 115 98  Resp:  18 17 18   Temp:  98.3 F (36.8 C) 98.3 F (36.8 C) 98.1 F (36.7 C)  TempSrc:  Oral Oral Oral  SpO2: 98% 100% 100% 100%  Weight:      Height:          Intake/Output Summary (Last 24 hours) at 07/18/2019 1040 Last data filed at 07/18/2019 0547 Gross per 24 hour  Intake 1527.74 ml  Output 1417 ml  Net 110.74 ml        Recent Labs    07/17/19 0835 07/18/19 0550  WBC 16.3* 10.7*  HGB 10.0* 8.4*  HCT 32.4* 27.4*  PLT 207 161   CMP Latest Ref Rng & Units 07/17/2019 07/16/2019 06/30/2019  Glucose 70 - 99 mg/dL 103(H) 96 143(H)  BUN 6 - 20 mg/dL <5(L) 10 6  Creatinine 0.44 - 1.00 mg/dL 0.53 0.60 0.51  Sodium 135 - 145 mmol/L 139 138 140  Potassium 3.5 - 5.1 mmol/L 4.0 4.0 4.3  Chloride 98 - 111 mmol/L 107 108 111  CO2 22 - 32 mmol/L 22 20(L) 21(L)  Calcium 8.9 - 10.3 mg/dL 9.3 9.2 9.1  Total Protein 6.5 - 8.1 g/dL 5.9(L) 5.9(L) 5.7(L)  Total Bilirubin 0.3 - 1.2 mg/dL 0.9 0.2(L) 0.4  Alkaline  Phos 38 - 126 U/L 91 85 70  AST 15 - 41 U/L 15 11(L) 11(L)  ALT 0 - 44 U/L 12 11 10     Blood type: --/--/O POS (02/03 0024)  Rubella: Immune (08/18 0000)  Vaccines: TDaP UTD         Flu    UTD   Physical Exam:  General: alert, cooperative and no distress CV: Regular rate and rhythm Resp: clear Abdomen: soft, nontender, normal bowel sounds, large panus Incision: clean, dry, intact and J-Peg in place and draining serosanguinous discharge Uterine Fundus: firm, below umbilicus, nontender Lochia: minimal Ext: + 1 pedal and pretibial edema, no redness or tenderness in the calves or thighs      Assessment/Plan: 29 y.o.   POD# 1. G1P1001                  Principal Problem:   Postpartum care following cesarean delivery 2/4 Active Problems:   BMI 45.0-49.9, adult (HCC)  - on Lovenox daily for DVT prophylaxis  - TED hose for edema added   Gestational hypertension, third trimester  - patient has remote hx of chronic HTN which resolved after her bariatric sx, then developed GHTN during pregnancy  -  labs stable, no neural sx, good diuresis  - BP in mild range today, yesterday received Procardia 60 XL and additional 30 XL in the evening for severe range BP x 1  - will continue on Procardia 90XL daily for now, monitor closely for BP response and adjust accordingly.    History of bariatric surgery  - avoid NSAIDs unless pain not controlled with APAP and oxycodone   Anemia  -          ABL Anemia compounding chronic IDA                         - IV Feraheme x 1 dose now                         - Start Niferex 150mg  PO daily tomorrow                         - Magnesium oxide 400mg  PO daily    Anxiety  - stable off meds   Benign intracranial hypertension  - neuro consult in the past, pending new consult outpatient   Cesarean delivery -  IOL with fetal intolerance of labor and FTP  - J-P drain in place and patent   Doing well, stable.               Advance diet as tolerated Encourage  rest when baby rests Breastfeeding support, LC consult for latch, consider S&S feedings for training newborn at breast Encourage to ambulate, abdominal binder for comfort Routine post-op care  , CNM, MSN 07/18/2019, 10:40 AM

## 2019-07-18 NOTE — Lactation Note (Signed)
This note was copied from a baby's chart. Lactation Consultation Note  Patient Name: Cassandra Tucker Today's Date: 07/18/2019 Reason for consult: Follow-up assessment   Request earlier today for an LC visit at this time, however, once I arrived the RN informed me that baby would not be breast feeding at this feeding.  Her blood sugars have been low and there has been a specific feeding plan put into place.  She will call for [facility] assistance when appropriate.    Consult Status Consult Status: PRN Date: 07/18/19 Follow-up type: Call as needed    Layce Sprung R Dametri Ozburn 07/18/2019, 5:05 PM

## 2019-07-19 ENCOUNTER — Encounter: Payer: Self-pay | Admitting: *Deleted

## 2019-07-19 NOTE — Progress Notes (Signed)
Cassandra Tucker [id number] Postpartum Postoperative Day # 2  Cassandra Tucker, G1P1001, [redacted]w[redacted]d, S/P Primary LT Cesarean Section due to 20 1/7wks undergoing IOL with fetal intolerance of labor and FTP.  Subjective: Patient up ad lib, denies syncope or dizziness. Reports consuming regular diet without issues and denies N/V. Patient reports 0 bowel movement + passing flatus.  Denies issues with urination and reports bleeding is "lighter."  Patient is breast and bottle feeding and reports going well, but newborn baby female in couplet care due to hypoglycemia.  Desires undecided for postpartum contraception.  Pain is being appropriately managed with use of po meds. Pt denies HA, CP, SOB, RUQ pain, vision changes or heart palpitation. Pt talked about CHTN, and not GHTN, pt also informed me she has always had an elevated resting heart rate in the 100s-110s, pt endorses drinking lots of fluid sand urinating a lot, pt offered a fluid bolus, pt declined.    Objective: Patient Vitals for the past 24 hrs:  BP Temp Temp src Pulse Resp SpO2  07/19/19 0513 138/73 98.1 F (36.7 C) Oral (!) 119 17 100 %  07/18/19 2008 (!) 152/94 97.7 F (36.5 C) Oral (!) 108 18 100 %  07/18/19 1445 (!) 149/81 98 F (36.7 C) Oral (!) 119 19 99 %     Physical Exam:  General: alert, cooperative, appears stated age, no distress and morbidly obese Mood/Affect: FLat Lungs: clear to auscultation, no wheezes, rales or rhonchi, symmetric air entry.  Heart: normal rate, regular rhythm, normal S1, S2, no murmurs, rubs, clicks or gallops. Breast: breasts appear normal, no suspicious masses, no skin or nipple changes or axillary nodes. Abdomen:  + bowel sounds, soft, non-tender Incision: healing well, no significant drainage, no dehiscence, no significant erythema, Honeycomb dressing  Uterine Fundus: firm, involution -1 Lochia: appropriate Skin: Warm, Dry. DVT Evaluation: No evidence of DVT seen on physical exam. Negative Homan's  sign. No cords or calf tenderness. No significant calf/ankle edema. JP drain: 10 mls, serosanguinous fluids, light pink, no clot present now.   Labs: Recent Labs    07/17/19 0523 07/17/19 0835 07/18/19 0550  HGB 11.0* 10.0* 8.4*  HCT 35.6* 32.4* 27.4*  WBC 15.1* 16.3* 10.7*    CBG (last 3)  No results for input(s): GLUCAP in the last 72 hours.   I/O: I/O last 3 completed shifts: In: 1527.7 [I.V.:1527.7] Out: 895 [Urine:800; Drains:95]   Assessment Postpartum Postoperative Day # 2  Cassandra Tucker, G1P1001, [redacted]w[redacted]d, S/P Primary LT Cesarean Section due to 37 1/7wks undergoing IOL with fetal intolerance of labor and FTP.  Pt stable. -1 Involution. Breast and bottle Feeding. Hemodynamically Stable drop from 11-8.4, asymptomatic. JP draining place and output over last 24 hours. GHTN verus pt endorses CHTN: asymptomatic, BP currenlty 138/73, on 90XL procardia daily, neg labs, PCR 0.22. Obesity: ambulating, stable, on lovenox.  Anemia: Ferraheme transfusion on 2/5, iron daily, stable and asymptomatic.  Tachycardia: 110s, DR Su Hilt recommended 1L NS, pt declined. Pt strable and endorses resting HR is 110s.   Plan: Continue other mgmt as ordered Tachycardia: Monitor, PO hydration.  JP Drain: If less then in 12 hours plan to remove, if >pt will be discharge home with close f/u GHTN versus CHTN: monitor continue meds, f/u in 1 week after discharge for bp check.  Anemia: continue daily magnesium and iron supplements.  VTE Prophylactics: SCD, ambulated as tolerates & Lovenox proph.  Pain control: Motrin/Tylenol/Narcotics PRN Education given regarding options for contraception, including barrier  methods, injectable contraception, IUD placement, oral contraceptives.  Plan for discharge tomorrow, Breastfeeding and Lactation consult  Dr. Mancel Bale to be updated on patient status  [facility] NP-C, CNM 07/19/2019, 11:01 AM

## 2019-07-19 NOTE — Lactation Note (Signed)
This note was copied from a baby's chart. Lactation Consultation Note  Patient Name: Cassandra Tucker Today's Date: 07/19/2019 Reason for consult: Follow-up assessment;Primapara;1st time breastfeeding;NICU baby;Early term 68-38.6wks  Visited with mom of a 40 hours old ETI NICU female, she's a P1. Mom rooming in with baby in couplet care, she told LC that she's been pumping but most likely not consistently, mom only got a few drops yesterday when she pumped and a few more today. Advised her to give any amount of EBM to her baby, baby is currently on Similac 24 calorie formula.  Mom voiced she's still pumping with her nipple piercings on and that they're not bothering her, she didn't report any pain or discomfort. She's not taking baby to breast, she's just pumping. WIC referral form was sent by previous LC, mom still plans of BF but will "follow baby" according to what she wants.   Let mom know that the purpose of pumping early on is mainly for breast stimulation and not necessarily to get volume, and encourage to keep pumping more often regardless of the amount of EBM she may get. Reviewed pumping schedule, lactogenesis II and benefits of breastmilk.  Feeding plan:  1. Encouraged mom to start pumping consistently every 3 hours; at least 8 pumping sessions/24 hours 2. She'll continue turning her milk in to her RN according to breastmilk storage guidelines for NICU babies  No support person in mom's room at the time of [facility] consultation. Mom reported all questions and concerns were answered, she's aware of LC OP services and will call PRN.   Maternal Data    Feeding Feeding Type: Formula Nipple Type: Slow - flow  LATCH Score                   Interventions Interventions: Breast feeding basics reviewed;DEBP  Lactation Tools Discussed/Used Tools: Pump Breast pump type: Double-Electric Breast Pump   Consult Status Consult Status: PRN Follow-up type:  In-patient    Efrata Brunner Venetia Constable 07/19/2019, 12:11 PM

## 2019-07-19 NOTE — Progress Notes (Signed)
Reported via phone that honeycomb had come of patient again. Provider [facility] NP gave verbal permission to leave honeycomb off. Provider requests pad placed over incision to keep dry.

## 2019-07-20 ENCOUNTER — Ambulatory Visit: Payer: Self-pay

## 2019-07-20 DIAGNOSIS — O9081 Anemia of the puerperium: Secondary | ICD-10-CM | POA: Diagnosis not present

## 2019-07-20 MED ORDER — NIFEDIPINE ER OSMOTIC RELEASE 90 MG PO TB24: 90.0000 mg | ORAL_TABLET | Freq: Every day | ORAL | 3 refills | Status: DC

## 2019-07-20 MED ORDER — OXYCODONE HCL 5 MG PO TABS: 5.0000 mg | ORAL_TABLET | ORAL | 0 refills | Status: DC | PRN

## 2019-07-20 MED ORDER — SERTRALINE HCL 25 MG PO TABS: 25.0000 mg | ORAL_TABLET | Freq: Every day | ORAL | 2 refills | Status: DC

## 2019-07-20 NOTE — Lactation Note (Signed)
This note was copied from a baby's chart. Lactation Consultation Note  Patient Name: Cassandra Tucker Today's Date: 07/20/2019 Reason for consult: Follow-up assessment;NICU baby;1st time breastfeeding;Primapara;Early term 37-38.6wks;Infant < 6lbs  LC in to visit with P1 Mom of ET infant in the NICU.  Baby 79 hrs old and at 1.4% weight loss.  Baby continues on IV fluids due to low CBGs yesterday.  Baby is taking po feedings by bottle, last feeding he took 60 ml.  Mom is more interested in pumping than breastfeeding at this point.  Mom sitting on side of bed, pumping one breast at a time.  Talked about benefits of pumping both breast together.  Talked about making a hand's free bra from an old sport's bra, that way she could massage her breasts while she is pumping. Mom expressing about 5-7 ml per sessions currently.   Mom is discharged, has WIC.  Knows about the [facility] loaner for $30 deposit.  Questioned whether she could use a hand pump overnight and obtain the DEBP from [facility] tomorrow (she has already spoken to them).  Showed Mom and FOB how to assemble pump parts to create a manual double pump.  Mom was excited about this.  Set up separate basin for washing and drying pump parts.      Interventions Interventions: Breast feeding basics reviewed;Skin to skin;Breast massage;Hand express;DEBP;Hand pump  Lactation Tools Discussed/Used Tools: Pump;Bottle Breast pump type: Double-Electric Breast Pump   Consult Status Consult Status: Follow-up Date: 07/21/19 Follow-up type: In-patient    [name] 07/20/2019, 1:53 PM

## 2019-07-20 NOTE — Discharge Summary (Signed)
Primary CS OB Discharge Summary     Patient Name: Cassandra Tucker DOB: November 15, 1990 MRN: [id number]  Date of admission: 07/16/2019 Delivering MD: Osborn Coho  Date of delivery: 07/17/2019 Type of delivery: primary CS  Newborn Data: Sex: Baby female In NICU on couplet care for newborn feeding issues.  Live born female  Birth Weight: 5 lb 10.8 oz (2575 g) APGAR: 1, 9  Newborn Delivery   Birth date/time: 07/17/2019 06:53:00 Delivery type: C-Section, Low Transverse Trial of labor: Yes C-section categorization: Primary      Feeding: breast and bottle Infant being discharge to home with mother in stable condition.   Admitting diagnosis: Gestational hypertension, third trimester [O13.3] Intrauterine pregnancy: [redacted]w[redacted]d     Secondary diagnosis:  Principal Problem:   Postpartum care following cesarean delivery 2/4 Active Problems:   BMI 45.0-49.9, adult (HCC)   Chronic hypertension affecting pregnancy   History of bariatric surgery   Anemia   Anxiety   Cesarean delivery -  IOL with fetal intolerance of labor and FTP   Postpartum anemia                                Complications: None                                                              Intrapartum Procedures: cesarean: low cervical, transverse Postpartum Procedures: transfusion Iron , ferraheme on 2/5 for anemia hgb was 8.4 post delivery, no acute blood loss.  Complications-Operative and Postpartum: none Augmentation: AROM, Pitocin, Cytotec and Foley Balloon   History of Present Illness: [name] is a 29 y.o. female, G1P1001, who presents at [redacted]w[redacted]d weeks gestation. The patient has been followed at  [facility] and Gynecology  Her pregnancy has been complicated by:  Patient Active Problem List   Diagnosis Date Noted  . Postpartum anemia 07/20/2019  . Postpartum care following cesarean delivery 2/4 07/18/2019  . Cesarean delivery -  IOL with fetal intolerance of labor and FTP  07/18/2019  . Marginal insertion of umbilical cord affecting management of mother 07/16/2019  . Chronic hypertension affecting pregnancy 07/01/2019  . Pregnancy 06/29/2019  . BMI 45.0-49.9, adult (HCC) 06/29/2019  . Pregnancy-induced hypertension 06/26/2019  . Anemia 05/21/2019  . Asthma 05/18/2019  . Maternal obesity affecting pregnancy, antepartum 01/28/2019  . History of depression 01/28/2019  . Anxiety 01/26/2019  . History of bariatric surgery 01/13/2019  . Genital herpes simplex 01/13/2019  . Pregnant 12/25/2018  . Vitamin D deficiency 02/26/2017  . Class 3 obesity with serious comorbidity and body mass index (BMI) of 60.0 to 69.9 in adult 02/26/2017  . Controlled type 2 diabetes mellitus without complication, without long-term current use of insulin (HCC) 04/21/2016  . Gastroesophageal reflux disease without esophagitis 04/21/2016  . Pseudotumor cerebri syndrome 11/29/2015  . Super obesity 11/29/2015  . OSA on CPAP 11/29/2015  . RLS (restless legs syndrome) 11/29/2015  . Papilledema 06/16/2015  . Pain in the wrist 04/28/2014  . 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 HLD (hyperlipidemia) 01/21/2014  . Sinus tachycardia 12/04/2011  . Essential hypertension 12/04/2011  . Hypertension 12/04/2011  . Obesity, Class III, BMI 40-49.9 (morbid obesity) (HCC) 11/07/2011    Hospital course:  Induction of Labor With Vaginal Delivery   29 y.o. yo G1P1001 at [redacted]w[redacted]d was admitted to the hospital 07/16/2019 for induction of labor.  Indication for induction: orgininally was IOL for GHTN, turns out pt had CHNT, was not on meds at admission time. .  Patient had an uncomplicated labor course as follows: Membrane Rupture Time/Date: 12:14 PM ,07/16/2019   Intrapartum Procedures: Episiotomy: None [1]                                         Lacerations:  None [1]  Patient had delivery of a Viable infant.  Information for the patient's newborn:  Vermette, Girl Cortnee [[id number]]  Delivery Method:  C-Section, Low Transverse(Filed from Delivery Summary)    07/17/2019  Details of delivery can be found in separate delivery note.  Patient had a routine postpartum course. Patient is discharged home 07/20/19.  Hospital Course--Unscheduled Cesarean:  Admitted 07/17/2019. Negative GBS.  Utilized epidural for pain management.   Primary LTCS.  Due to fetal intolerence, remote form from delivery @ 6cm, , she was consented for cesarean, with Dr. Mancel Bale performing a primary LTCS under epidural anesthesia, with delivery of a viable baby  Female in NICU on couplet care for sugar issues, pt to be discharged today to room in with baby. , with weight and Apgars as listed below. Infant was in good condition and remained at the patient's bedside.  The patient was taken to recovery in good condition.  Patient planned to bottle and breast feed.  On post-op day 1, patient was doing well, tolerating a regular diet, with Hgb of 10-8.4, asymptomatic, but received iron transfusion x1 on 2/5. Pt stable, denies s/sx of anemia. .  Throughout her stay, her physical exam was WNL, her incision was CDI, and her vital signs remained stable.  By post-op day 1, she was up ad lib, tolerating a regular diet, with good pain control with po med.  She was deemed to have received the full benefit of her hospital stay, and was discharged home in stable condition.  Contraceptive choice was undecided. Pt feels sad thaT BABY CAN NOT GO HOME TODAY, PT WAS TEARED, AND ENDORSES ALWAYS HAVE DX OF ANXIETY, AND HAD PREVIOUSLY BEEN ON MEDS, discussed ppd AND RECOMMENDED STARTING ZOLOFT 25MG  DAILY THEN IN CREASE TO 50MG  IN 2 WEEKS, PT AGREEABLE.  Pt alos has h/o obesity, on lovenox PP, encouraged to ambulate, , diet and exercise. Pt had CHTN on  90mg  xl procardia currently, bp 133/72, asymptomatic, denies HA, RUQ pain or vision changes.    Physical exam  Vitals:   07/19/19 1400 07/19/19 2153 07/20/19 0639 07/20/19 0715  BP: (!) 149/85 119/77 138/83 133/72   Pulse: (!) 111 (!) 114 (!) 102 (!) 106  Resp: 18 20 18 18   Temp: 98.4 F (36.9 C) 98.5 F (36.9 C) 98.6 F (37 C) 99 F (37.2 C)  TempSrc: Oral Oral Oral Oral  SpO2: 100% 99% 97% 99%  Weight:      Height:       General: alert, cooperative and no distress Lochia: appropriate Uterine Fundus: firm Incision: Healing well with no significant drainage, No significant erythema, Dressing is clean, dry, and intact,, JP drain had 88mls of serosanguinous fluid over last 12 hours, suture removed from drain and drain pulled, pt tolerated well.  Perineum: Intact DVT Evaluation: No evidence of DVT seen on physical exam.  Negative Homan's sign. No cords or calf tenderness. No significant calf/ankle edema.  Labs: Lab Results  Component Value Date   WBC 10.7 (H) 07/18/2019   HGB 8.4 (L) 07/18/2019   HCT 27.4 (L) 07/18/2019   MCV 82.0 07/18/2019   PLT 161 07/18/2019   CMP Latest Ref Rng & Units 07/17/2019  Glucose 70 - 99 mg/dL 725(D)  BUN 6 - 20 mg/dL <6(U)  Creatinine 4.40 - 1.00 mg/dL 3.47  Sodium 425 - 956 mmol/L 139  Potassium 3.5 - 5.1 mmol/L 4.0  Chloride 98 - 111 mmol/L 107  CO2 22 - 32 mmol/L 22  Calcium 8.9 - 10.3 mg/dL 9.3  Total Protein 6.5 - 8.1 g/dL 5.9(L)  Total Bilirubin 0.3 - 1.2 mg/dL 0.9  Alkaline Phos 38 - 126 U/L 91  AST 15 - 41 U/L 15  ALT 0 - 44 U/L 12    Date of discharge: 07/20/2019 Discharge Diagnoses: Term Pregnancy-delivered and CHTN, Anxiety Discharge instruction: per After Visit Summary and "Baby and Me Booklet".  After visit meds:   Activity:           unrestricted and pelvic rest Advance as tolerated. Pelvic rest for 6 weeks.  Diet:                routine Medications: PNV, Iron and zoloft 25mg  daily , procardia 90xl daily Postpartum contraception: Undecided Condition:  Pt discharge to room in , in stable condition , newborn having BS issues.  CHTN: F/u in 1 week for BP check Anemia: Iron at home Asthma: Inhaler PRN Anxiety: mood f/u in 1 month to  increase med and EPDS.   Meds: Allergies as of 07/20/2019   No Known Allergies     Medication List    STOP taking these medications   dicyclomine 20 MG tablet Commonly known as: BENTYL   Vistaril 50 MG capsule Generic drug: hydrOXYzine     TAKE these medications   albuterol 108 (90 Base) MCG/ACT inhaler Commonly known as: VENTOLIN HFA Inhale 2 puffs into the lungs every 6 (six) hours as needed for wheezing or shortness of breath. What changed: Another medication with the same name was removed. Continue taking this medication, and follow the directions you see here.   budesonide 180 MCG/ACT inhaler Commonly known as: PULMICORT Inhale 2 puffs into the lungs 2 (two) times daily as needed (shortness of breath).   ferrous sulfate 325 (65 FE) MG tablet Take 325 mg by mouth daily.   NIFEdipine 90 MG 24 hr tablet Commonly known as: PROCARDIA XL/NIFEDICAL-XL Take 1 tablet (90 mg total) by mouth daily. What changed:   medication strength  how much to take  when to take this   oxyCODONE 5 MG immediate release tablet Commonly known as: Oxy IR/ROXICODONE Take 1 tablet (5 mg total) by mouth every 4 (four) hours as needed for moderate pain.   Prenatal Vitamin and Mineral 28-0.8 MG Tabs Take 1 tablet by mouth daily.   sertraline 25 MG tablet Commonly known as: Zoloft Take 1 tablet (25 mg total) by mouth daily. In 2 weeks mat increase to 50mg  daily.   valACYclovir 1000 MG tablet Commonly known as: VALTREX Take 1 tablet (1,000 mg total) by mouth daily.            Discharge Care Instructions  (From admission, onward)         Start     Ordered   07/20/19 0000  Discharge wound care:    Comments: Take dressing off  on day 5-7 postpartum.  Report increased drainage, redness or warmth. Clean with water, let soap trickle down body. Can leave steri strips on until they fall off or take them off gently at day 10. Keep open to air, clean and dry.   07/20/19 0953           Discharge Follow Up:  Follow-up Information    [facility] Follow up.   Specialty: Obstetrics and Gynecology Why: 1 week BP f/u, 1 month mood check for starting zoloft, 6 weeks PPV Contact information: [address] [place] [address] [phone]           [place], NP-C, CNM 07/20/2019, 12:26 PM  Dale Stanton, FNP

## 2019-07-21 ENCOUNTER — Ambulatory Visit: Payer: Self-pay

## 2019-07-21 LAB — SURGICAL PATHOLOGY

## 2019-07-21 NOTE — Lactation Note (Signed)
This note was copied from a baby's chart. Lactation Consultation Note  Patient Name: Cassandra Tucker Today's Date: 07/21/2019  Called to speak to mom but she is not at hospital at this time.   Maternal Data    Feeding Feeding Type: Bottle Fed - Formula Nipple Type: Slow - flow  LATCH Score                   Interventions    Lactation Tools Discussed/Used     Consult Status      Huston Foley 07/21/2019, 9:44 AM

## 2019-08-27 DIAGNOSIS — Z7251 High risk heterosexual behavior: Secondary | ICD-10-CM | POA: Diagnosis not present

## 2019-08-27 DIAGNOSIS — J45909 Unspecified asthma, uncomplicated: Secondary | ICD-10-CM | POA: Diagnosis not present

## 2019-08-27 DIAGNOSIS — Z3040 Encounter for surveillance of contraceptives, unspecified: Secondary | ICD-10-CM | POA: Diagnosis not present

## 2019-08-27 DIAGNOSIS — Z113 Encounter for screening for infections with a predominantly sexual mode of transmission: Secondary | ICD-10-CM | POA: Diagnosis not present

## 2019-10-02 ENCOUNTER — Ambulatory Visit (INDEPENDENT_AMBULATORY_CARE_PROVIDER_SITE_OTHER): Payer: Medicaid Other | Admitting: Nurse Practitioner

## 2019-10-02 ENCOUNTER — Encounter: Payer: Self-pay | Admitting: Nurse Practitioner

## 2019-10-02 DIAGNOSIS — J3089 Other allergic rhinitis: Secondary | ICD-10-CM | POA: Diagnosis not present

## 2019-10-02 DIAGNOSIS — J4530 Mild persistent asthma, uncomplicated: Secondary | ICD-10-CM

## 2019-10-02 NOTE — Progress Notes (Signed)
   Virtual Visit via telephone Note Due to COVID-19 pandemic this visit was conducted virtually. This visit type was conducted due to national recommendations for restrictions regarding the COVID-19 Pandemic (e.g. social distancing, sheltering in place) in an effort to limit this patient's exposure and mitigate transmission in our community. All issues noted in this document were discussed and addressed.  A physical exam was not performed with this format.  I connected with Cassandra Tucker on 10/02/19 at 11:10 by telephone and verified that I am speaking with the correct person using two identifiers. Cassandra Tucker is currently located at home and no ne is currently with her during visit. The provider, Mary-Margaret Daphine Deutscher, FNP is located in their office at time of visit.  I discussed the limitations, risks, security and privacy concerns of performing an evaluation and management service by telephone and the availability of in person appointments. I also discussed with the patient that there may be a patient responsible charge related to this service. The patient expressed understanding and agreed to proceed.   History and Present Illness:   Chief Complaint: Asthma   HPI Patient calls in stating that she is constantly sneezing and has running nose. She says she feels short of breath like she cannot get a breath. She has had this since she got pregnant last year. Would like to see specialist.      Review of Systems  Constitutional: Negative.   HENT: Positive for congestion. Negative for sinus pain and sore throat.   Respiratory: Positive for shortness of breath.   Skin: Negative.   Neurological: Negative.   Psychiatric/Behavioral: Negative.   All other systems reviewed and are negative.    Observations/Objective: Alert and oriented- answers all questions appropriately No distress Voice sounds nasal Slight congestion noted  Assessment and Plan: Cassandra Tucker in today  with chief complaint of Asthma   1. Non-seasonal allergic rhinitis, unspecified trigger flonase as rx claritin in evening Force fluids - Ambulatory referral to Allergy  2. Mild persistent asthma, unspecified whether complicated Referral made to allergy and asthma clinic     Follow Up Instructions: prn    I discussed the assessment and treatment plan with the patient. The patient was provided an opportunity to ask questions and all were answered. The patient agreed with the plan and demonstrated an understanding of the instructions.   The patient was advised to call back or seek an in-person evaluation if the symptoms worsen or if the condition fails to improve as anticipated.  The above assessment and management plan was discussed with the patient. The patient verbalized understanding of and has agreed to the management plan. Patient is aware to call the clinic if symptoms persist or worsen. Patient is aware when to return to the clinic for a follow-up visit. Patient educated on when it is appropriate to go to the emergency department.   Time call ended:  11:27  I provided 17 minutes of non-face-to-face time during this encounter.    Mary-Margaret Daphine Deutscher, FNP

## 2019-10-03 ENCOUNTER — Other Ambulatory Visit: Payer: Self-pay | Admitting: Nurse Practitioner

## 2019-10-03 MED ORDER — LORATADINE 10 MG PO TABS: 10.0000 mg | ORAL_TABLET | Freq: Every day | ORAL | 6 refills | Status: DC

## 2019-10-03 MED ORDER — FLUTICASONE PROPIONATE 50 MCG/ACT NA SUSP: 2.0000 | Freq: Every day | NASAL | 6 refills | Status: DC

## 2019-10-03 NOTE — Telephone Encounter (Signed)
Patient had a phone visit with [name] on 10/02/2019 for seasonal allergies.  Provider recommended Flonase and Claritin but medications were not sent in.  Will send those in now, patient aware.

## 2019-10-15 DIAGNOSIS — Z30430 Encounter for insertion of intrauterine contraceptive device: Secondary | ICD-10-CM | POA: Diagnosis not present

## 2019-11-06 ENCOUNTER — Other Ambulatory Visit: Payer: Self-pay

## 2019-11-06 ENCOUNTER — Encounter: Payer: Self-pay | Admitting: Allergy

## 2019-11-06 ENCOUNTER — Ambulatory Visit (INDEPENDENT_AMBULATORY_CARE_PROVIDER_SITE_OTHER): Payer: Medicaid Other | Admitting: Allergy

## 2019-11-06 VITALS — BP 162/98 | HR 97 | Temp 98.0°F | Resp 16 | Ht 66.3 in | Wt 298.0 lb

## 2019-11-06 DIAGNOSIS — H1013 Acute atopic conjunctivitis, bilateral: Secondary | ICD-10-CM | POA: Insufficient documentation

## 2019-11-06 DIAGNOSIS — J45909 Unspecified asthma, uncomplicated: Secondary | ICD-10-CM | POA: Diagnosis not present

## 2019-11-06 DIAGNOSIS — J3089 Other allergic rhinitis: Secondary | ICD-10-CM | POA: Insufficient documentation

## 2019-11-06 MED ORDER — BUDESONIDE-FORMOTEROL FUMARATE 80-4.5 MCG/ACT IN AERO: 2.0000 | INHALATION_SPRAY | Freq: Two times a day (BID) | RESPIRATORY_TRACT | 5 refills | Status: DC

## 2019-11-06 MED ORDER — AZELASTINE HCL 0.1 % NA SOLN: 1.0000 | Freq: Two times a day (BID) | NASAL | 5 refills | Status: DC | PRN

## 2019-11-06 NOTE — Assessment & Plan Note (Signed)
   See assessment and plan as above for allergic rhinitis.  Declines eye drops.  

## 2019-11-06 NOTE — Assessment & Plan Note (Signed)
Symptoms of chest tightness, shortness of breath, coughing, wheezing, nocturnal awakenings for 1 year. Daily symptoms and using albuterol 1-2 times a day. No daily ICS inhalers. History of reflux but no longer on PPI. Ex-smoker. Wheezing on exam today.   Today's spirometry showed: normal pattern and 13% improvement in FEV1 post bronchodilator treatment. Clinically feeling better. . Daily controller medication(s): start Symbicort 80 2 puffs twice a day and rinse mouth afterwards. Marland Kitchen Spacer given and demonstrated proper use with inhaler. Patient understood technique and all questions/concerned were addressed.  . May use albuterol rescue inhaler 2 puffs every 4 to 6 hours as needed for shortness of breath, chest tightness, coughing, and wheezing. May use albuterol rescue inhaler 2 puffs 5 to 15 minutes prior to strenuous physical activities. Monitor frequency of use.  . Repeat spirometry at next visit.

## 2019-11-06 NOTE — Patient Instructions (Addendum)
Today's skin testing showed: Positive to grass, dust mites, cats, feathers.  Environmental allergies  Start environmental control measures as below.  May use over the counter antihistamines such as Zyrtec (cetirizine), Claritin (loratadine), Allegra (fexofenadine), or Xyzal (levocetirizine) daily as needed.  May use Flonase 1 spray per nostril twice a day for nasal congestion.  May use azelastine 1-2 spray per nostril twice a day for runny nose.   Asthma: . Daily controller medication(s): start Symbicort 80 2 puffs twice a day and rinse mouth afterwards. . May use albuterol rescue inhaler 2 puffs every 4 to 6 hours as needed for shortness of breath, chest tightness, coughing, and wheezing. May use albuterol rescue inhaler 2 puffs 5 to 15 minutes prior to strenuous physical activities. Monitor frequency of use.  . Asthma control goals:  o Full participation in all desired activities (may need albuterol before activity) o Albuterol use two times or less a week on average (not counting use with activity) o Cough interfering with sleep two times or less a month o Oral steroids no more than once a year o No hospitalizations  Follow up in 2 months or sooner if needed.   Reducing Pollen Exposure . Pollen seasons: trees (spring), grass (summer) and ragweed/weeds (fall). Marland Kitchen Keep windows closed in your home and car to lower pollen exposure.  Susa Simmonds air conditioning in the bedroom and throughout the house if possible.  . Avoid going out in dry windy days - especially early morning. . Pollen counts are highest between 5 - 10 AM and on dry, hot and windy days.  . Save outside activities for late afternoon or after a heavy rain, when pollen levels are lower.  . Avoid mowing of grass if you have grass pollen allergy. Marland Kitchen Be aware that pollen can also be transported indoors on people and pets.  . Dry your clothes in an automatic dryer rather than hanging them outside where they might collect  pollen.  . Rinse hair and eyes before bedtime. Control of House Dust Mite Allergen . Dust mite allergens are a common trigger of allergy and asthma symptoms. While they can be found throughout the house, these microscopic creatures thrive in warm, humid environments such as bedding, upholstered furniture and carpeting. . Because so much time is spent in the bedroom, it is essential to reduce mite levels there.  . Encase pillows, mattresses, and box springs in special allergen-proof fabric covers or airtight, zippered plastic covers.  . Bedding should be washed weekly in hot water (130 F) and dried in a hot dryer. Allergen-proof covers are available for comforters and pillows that can't be regularly washed.  Wendee Copp the allergy-proof covers every few months. Minimize clutter in the bedroom. Keep pets out of the bedroom.  Marland Kitchen Keep humidity less than 50% by using a dehumidifier or air conditioning. You can buy a humidity measuring device called a hygrometer to monitor this.  . If possible, replace carpets with hardwood, linoleum, or washable area rugs. If that's not possible, vacuum frequently with a vacuum that has a HEPA filter. . Remove all upholstered furniture and non-washable window drapes from the bedroom. . Remove all non-washable stuffed toys from the bedroom.  Wash stuffed toys weekly. Pet Allergen Avoidance: . Contrary to popular opinion, there are no "hypoallergenic" breeds of dogs or cats. That is because people are not allergic to an animal's hair, but to an allergen found in the animal's saliva, dander (dead skin flakes) or urine. Pet allergy symptoms typically  occur within minutes. For some people, symptoms can build up and become most severe 8 to 12 hours after contact with the animal. People with severe allergies can experience reactions in public places if dander has been transported on the pet owners' clothing. Marland Kitchen Keeping an animal outdoors is only a partial solution, since homes with  pets in the yard still have higher concentrations of animal allergens. . Before getting a pet, ask your allergist to determine if you are allergic to animals. If your pet is already considered part of your family, try to minimize contact and keep the pet out of the bedroom and other rooms where you spend a great deal of time. . As with dust mites, vacuum carpets often or replace carpet with a hardwood floor, tile or linoleum. . High-efficiency particulate air (HEPA) cleaners can reduce allergen levels over time. . While dander and saliva are the source of cat and dog allergens, urine is the source of allergens from rabbits, hamsters, mice and [place] pigs; so ask a non-allergic family member to clean the animal's cage. . If you have a pet allergy, talk to your allergist about the potential for allergy immunotherapy (allergy shots). This strategy can often provide long-term relief.

## 2019-11-06 NOTE — Progress Notes (Signed)
New Patient Note  RE: Cassandra Tucker MRN: [id number] DOB: 1990/08/01 Date of Office Visit: 11/06/2019  Referring provider: Chevis Pretty, * Primary care provider: Chevis Pretty, FNP  Chief Complaint: Allergic Rhinitis  (seasonal) and Asthma  History of Present Illness: I had the pleasure of seeing Cassandra Tucker for initial evaluation at the [facility] on 11/06/2019. She is a 29 y.o. female, who is referred here by Chevis Pretty, FNP for the evaluation of asthma and allergies. She is accompanied today by her husband who provided/contributed to the history.  Up to date with COVID-19 vaccine: no  Asthma:  She reports symptoms of chest tightness, shortness of breath, coughing, wheezing, nocturnal awakenings for 1 years. Current medications include albuterol prn which help. She reports not using aerochamber with inhalers. She tried the following inhalers: Pulmicort. Main triggers are exercise. In the last month, frequency of symptoms: daily. Frequency of nocturnal symptoms: 1-2x/night. Frequency of SABA use: 1-2x/day. Interference with physical activity: yes. Sleep is disturbed. In the last 12 months, emergency room visits/urgent care visits/doctor office visits or hospitalizations due to respiratory issues: few times. In the last 12 months, oral steroids courses: 0. Lifetime history of hospitalization for respiratory issues: 0. Prior intubations: 0. History of pneumonia: 0. She was not evaluated by allergist/pulmonologist in the past. Smoking exposure: quit 1 year ago, 1 pack per day x 10 yrs. Up to date with flu vaccine: yes.  History of reflux: used to take PPI  Rhinitis: She reports symptoms of nasal congestion, rhinorrhea, sneezing, itchy throat, itchy/watery eyes. Symptoms have been going on for 20+ years. The symptoms are present all year around with worsening in spring. Other triggers include exposure to mornings. Anosmia: diminished sense of  smell. Headache: sometimes. She has used Claritin, Flonase, zyrtec with some improvement in symptoms. Sinus infections: no. Previous work up includes: no. Previous ENT evaluation: no. Previous sinus imaging: no. History of nasal polyps: no. Last eye exam: 4 years ago.  Assessment and Plan: Cassandra Tucker is a 29 y.o. female with: Asthma, not well controlled, unspecified asthma severity, uncomplicated Symptoms of chest tightness, shortness of breath, coughing, wheezing, nocturnal awakenings for 1 year. Daily symptoms and using albuterol 1-2 times a day. No daily ICS inhalers. History of reflux but no longer on PPI. Ex-smoker. Wheezing on exam today.   Today's spirometry showed: normal pattern and 13% improvement in FEV1 post bronchodilator treatment. Clinically feeling better. . Daily controller medication(s): start Symbicort 80 2 puffs twice a day and rinse mouth afterwards. Marland Kitchen Spacer given and demonstrated proper use with inhaler. Patient understood technique and all questions/concerned were addressed.  . May use albuterol rescue inhaler 2 puffs every 4 to 6 hours as needed for shortness of breath, chest tightness, coughing, and wheezing. May use albuterol rescue inhaler 2 puffs 5 to 15 minutes prior to strenuous physical activities. Monitor frequency of use.  . Repeat spirometry at next visit.  Other allergic rhinitis Perennial rhino conjunctivitis symptoms for 20+ years with worsening in the spring. No previous allergy/ENT evaluation. Tried Claritin, Flonase and Zyrtec with some benefit.  Today's skin testing showed: Positive to grass, dust mites, cats, feathers. Negative to common foods.   Start environmental control measures as below.  May use over the counter antihistamines such as Zyrtec (cetirizine), Claritin (loratadine), Allegra (fexofenadine), or Xyzal (levocetirizine) daily as needed.  May use Flonase 1 spray per nostril twice a day for nasal congestion.  May use azelastine 1-2 spray  per nostril twice  a day for runny nose.   If above regimen does not control symptoms then will discuss starting allergy immunotherapy once asthma is more stable.   Allergic conjunctivitis of both eyes  See assessment and plan as above for allergic rhinitis. Declines eye drops.   Return in about 2 months (around 01/06/2020).  Meds ordered this encounter  Medications  . azelastine (ASTELIN) 0.1 % nasal spray    Sig: Place 1-2 sprays into both nostrils 2 (two) times daily as needed (runny nose, drainage). Use in each nostril as directed    Dispense:  30 mL    Refill:  5  . budesonide-formoterol (SYMBICORT) 80-4.5 MCG/ACT inhaler    Sig: Inhale 2 puffs into the lungs in the morning and at bedtime. with spacer and rinse mouth afterwards.    Dispense:  1 Inhaler    Refill:  5   Other allergy screening: Food allergy: no Medication allergy: no Hymenoptera allergy: no Urticaria: no Eczema:yes  As a child. History of recurrent infections suggestive of immunodeficency: no  Diagnostics: Spirometry:  Tracings reviewed. Her effort: Good reproducible efforts. FVC: 3.33L FEV1: 2.71L, 79% predicted FEV1/FVC ratio: 81% Interpretation: Spirometry consistent with normal pattern and 13% improvement in FEV1 post bronchodilator treatment. Clinically feeling better. Please see scanned spirometry results for details.  Skin Testing: Environmental allergy panel and select foods. Positive to grass, dust mites, cats, feathers. Negative test to: common foods.  Results discussed with patient/family. Airborne Adult Perc - 11/06/19 1545    Time Antigen Placed  1500    Allergen Manufacturer  Waynette Buttery    Location  Back    Number of Test  70    1. Control-Buffer 50% Glycerol  Negative    2. Control-Histamine 1 mg/ml  2+    3. Albumin saline  Negative    4. Bahia  Negative    5. French Southern Territories  Negative    6. Johnson  Negative    7. Kentucky Blue  4+    8. Meadow Fescue  Negative    9. Perennial Rye  2+     10. Sweet Vernal  2+    11. Timothy  3+    12. Cocklebur  Negative    13. Burweed Marshelder  Negative    14. Ragweed, short  Negative    15. Ragweed, Giant  Negative    16. Plantain,  English  Negative    17. Lamb's Quarters  Negative    18. Sheep Sorrell  Negative    19. Rough Pigweed  Negative    20. Marsh Elder, Rough  Negative    21. Mugwort, Common  Negative    22. Ash mix  Negative    23. Birch mix  Negative    24. Beech American  Negative    25. Box, Elder  Negative    26. Cedar, red  Negative    27. Cottonwood, Guinea-Bissau  Negative    28. Elm mix  Negative    29. Hickory  Negative    30. Maple mix  Negative    31. Oak, Guinea-Bissau mix  Negative    32. Pecan Pollen  Negative    33. Pine mix  Negative    34. Sycamore Eastern  Negative    35. Walnut, Black Pollen  Negative    36. Alternaria alternata  Negative    37. Cladosporium Herbarum  Negative    38. Aspergillus mix  Negative    39. Penicillium mix  Negative    40.  Bipolaris sorokiniana (Helminthosporium)  Negative    41. Drechslera spicifera (Curvularia)  Negative    42. Mucor plumbeus  Negative    43. Fusarium moniliforme  Negative    44. Aureobasidium pullulans (pullulara)  Negative    45. Rhizopus oryzae  Negative    46. Botrytis cinera  Negative    47. Epicoccum nigrum  Negative    48. Phoma betae  Negative    49. Candida Albicans  Negative    50. Trichophyton mentagrophytes  Negative    51. Mite, D Farinae  5,000 AU/ml  4+    52. Mite, D Pteronyssinus  5,000 AU/ml  4+    53. Cat Hair 10,000 BAU/ml  3+    54.  Dog Epithelia  Negative    55. Mixed Feathers  2+    56. Horse Epithelia  Negative    57. Cockroach, German  Negative    58. Mouse  Negative    59. Tobacco Leaf  Negative     Food Perc - 11/06/19 1545      Test Information   Time Antigen Placed  1500    Allergen Manufacturer  Waynette Buttery    Location  Back    Number of allergen test  10    Food  Select      Food   1. Peanut  Negative    2. Soybean  food  Negative    3. Wheat, whole  Negative    4. Sesame  Negative    5. Milk, cow  Negative    6. Egg White, chicken  Negative    7. Casein  Negative    8. Shellfish mix  Negative    9. Fish mix  Negative    10. Cashew  Negative     Intradermal - 11/06/19 1553    Time Antigen Placed  1525    Allergen Manufacturer  Waynette Buttery    Location  Arm    Number of Test  12    Intradermal  Select    Control  Negative    French Southern Territories  2+    Johnson  2+    Ragweed mix  Negative    Weed mix  Negative    Tree mix  Negative    Mold 1  Negative    Mold 2  Negative    Mold 3  Negative    Mold 4  Negative    Dog  Negative    Cockroach  Negative     Food Adult Perc - 11/06/19 1500    Time Antigen Placed  1500    Allergen Manufacturer  Greer    Location  Back    Number of allergen test  1    2. Israel Pig  Negative       Past Medical History: Patient Active Problem List   Diagnosis Date Noted  . Other allergic rhinitis 11/06/2019  . Allergic conjunctivitis of both eyes 11/06/2019  . Postpartum anemia 07/20/2019  . Postpartum care following cesarean delivery 2/4 07/18/2019  . Cesarean delivery -  IOL with fetal intolerance of labor and FTP 07/18/2019  . Marginal insertion of umbilical cord affecting management of mother 07/16/2019  . Chronic hypertension affecting pregnancy 07/01/2019  . Pregnancy 06/29/2019  . BMI 45.0-49.9, adult (HCC) 06/29/2019  . Pregnancy-induced hypertension 06/26/2019  . Anemia 05/21/2019  . Asthma, not well controlled, unspecified asthma severity, uncomplicated 05/18/2019  . Maternal obesity affecting pregnancy, antepartum 01/28/2019  . History of depression 01/28/2019  .  Anxiety 01/26/2019  . History of bariatric surgery 01/13/2019  . Genital herpes simplex 01/13/2019  . Pregnant 12/25/2018  . Vitamin D deficiency 02/26/2017  . Class 3 obesity with serious comorbidity and body mass index (BMI) of 60.0 to 69.9 in adult 02/26/2017  . Controlled type 2 diabetes  mellitus without complication, without long-term current use of insulin (HCC) 04/21/2016  . Gastroesophageal reflux disease without esophagitis 04/21/2016  . Pseudotumor cerebri syndrome 11/29/2015  . Super obesity 11/29/2015  . OSA on CPAP 11/29/2015  . RLS (restless legs syndrome) 11/29/2015  . Papilledema 06/16/2015  . Pain in the wrist 04/28/2014  . 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 HLD (hyperlipidemia) 01/21/2014  . Sinus tachycardia 12/04/2011  . Essential hypertension 12/04/2011  . Hypertension 12/04/2011  . Obesity, Class III, BMI 40-49.9 (morbid obesity) (HCC) 11/07/2011   Past Medical History:  Diagnosis Date  . Ankle fracture, left 2000  . Anxiety   . Arthritis    left wrist and left ankle  . Asthma   . Back pain   . Bronchitis   . Diabetes (HCC) 01/11/2016   type II  history of  . Family history of adverse reaction to anesthesia    gmother had problems with N/V   . GERD (gastroesophageal reflux disease)   . Headache   . History of kidney stones   . HLD (hyperlipidemia)   . HSV-2 infection   . Hypertension 2013  . Intracranial hypertension    psuedotumor cebrum  . Joint pain   . Kienbock's disease   . Leg edema   . OSA (obstructive sleep apnea)    cpap - does not know settings   . Papilledema   . Pregnancy induced hypertension   . RLS (restless legs syndrome)   . Sinus tachycardia    Past Surgical History: Past Surgical History:  Procedure Laterality Date  . CESAREAN SECTION N/A 07/17/2019   Procedure: CESAREAN SECTION;  Surgeon: Osborn Cohooberts, Angela, MD;  [facility];  Service: Obstetrics;  Laterality: N/A;  . FRACTURE SURGERY  2005   ankle  . FRACTURE SURGERY  2013   wrist (deteriorating bone)  . LAPAROSCOPIC GASTRIC SLEEVE RESECTION N/A 05/08/2017   Procedure: LAPAROSCOPIC GASTRIC SLEEVE RESECTION;  Surgeon: Berna Bueonnor, Chelsea A, MD;  Location: [facility] ORS;  Service: General;  Laterality: N/A;  . LAPAROSCOPIC GASTRIC SLEEVE RESECTION  05/08/2017  .  UPPER GI ENDOSCOPY  05/08/2017   Procedure: UPPER GI ENDOSCOPY;  Surgeon: Berna Bueonnor, Chelsea A, MD;  Location: [facility] ORS;  Service: General;;  . urethra stretch    . WRIST SURGERY  2015   Medication List:  Current Outpatient Medications  Medication Sig Dispense Refill  . fluticasone (FLONASE) 50 MCG/ACT nasal spray Place 2 sprays into both nostrils daily. 16 g 6  . loratadine (CLARITIN) 10 MG tablet Take 1 tablet (10 mg total) by mouth daily. 30 tablet 6  . albuterol (PROVENTIL HFA;VENTOLIN HFA) 108 (90 Base) MCG/ACT inhaler Inhale 2 puffs into the lungs every 6 (six) hours as needed for wheezing or shortness of breath. 1 Inhaler 1  . azelastine (ASTELIN) 0.1 % nasal spray Place 1-2 sprays into both nostrils 2 (two) times daily as needed (runny nose, drainage). Use in each nostril as directed 30 mL 5  . budesonide-formoterol (SYMBICORT) 80-4.5 MCG/ACT inhaler Inhale 2 puffs into the lungs in the morning and at bedtime. with spacer and rinse mouth afterwards. 1 Inhaler 5  . cetirizine (ZYRTEC) 10 MG tablet cetirizine 10 mg tablet  TAKE  1 TABLET BY MOUTH EVERY DAY    . chlorhexidine (PERIDEX) 0.12 % solution chlorhexidine gluconate 0.12 % mouthwash  RINSE WITH HALF CAPFUL IN THE MORNING AND EVENING FOR 30 SECONDS AFTER BRUSHING TEETH.    . levonorgestrel (MIRENA, 52 MG,) 20 MCG/24HR IUD Mirena 20 mcg/24 hours (6 yrs) 52 mg intrauterine device  Take 1 device by intrauterine route.    Marland Kitchen NIFEdipine (ADALAT CC) 60 MG 24 hr tablet Take 60 mg by mouth every morning.    Marland Kitchen NIFEdipine (PROCARDIA XL/NIFEDICAL-XL) 90 MG 24 hr tablet Take 1 tablet (90 mg total) by mouth daily. 30 tablet 3  . terconazole (TERAZOL 7) 0.4 % vaginal cream terconazole 0.4 % vaginal cream   1 applicatorful every day by vaginal route.     No current facility-administered medications for this visit.   Allergies: No Known Allergies Social History: Social History   Socioeconomic History  . Marital status: Married    Spouse name:  Not on file  . Number of children: 0  . Years of education: 12+  . Highest education level: Not on file  Occupational History  . Occupation: Nutritional therapist: [employer]  Tobacco Use  . Smoking status: Former Smoker    Packs/day: 1.00    Types: Cigarettes    Quit date: 10/05/2018    Years since quitting: 1.0  . Smokeless tobacco: Never Used  Substance and Sexual Activity  . Alcohol use: Yes    Alcohol/week: 0.0 standard drinks    Comment: socially  . Drug use: Yes    Types: Marijuana    Comment: early in pregnancy for nausea  . Sexual activity: Yes    Comment: condoms previously  Other Topics Concern  . Not on file  Social History Narrative   Lives with grandparents   Caffeine use: Drinks coffee/tea/soda- 20oz per day (none after starting diamox)   Social Determinants of Health   Financial Resource Strain:   . Difficulty of Paying Living Expenses:   Food Insecurity:   . Worried About Programme researcher, broadcasting/film/video in the Last Year:   . Barista in the Last Year:   Transportation Needs:   . Freight forwarder (Medical):   Marland Kitchen Lack of Transportation (Non-Medical):   Physical Activity:   . Days of Exercise per Week:   . Minutes of Exercise per Session:   Stress:   . Feeling of Stress :   Social Connections:   . Frequency of Communication with Friends and Family:   . Frequency of Social Gatherings with Friends and Family:   . Attends Religious Services:   . Active Member of Clubs or Organizations:   . Attends Banker Meetings:   Marland Kitchen Marital Status:    Lives in a trailer built in 1983. Smoking: quit in 2020, 1 pack per day x 10 years Occupation: Chief Technology Officer History: Water Damage/mildew in the house: no Carpet in the family room: no Carpet in the bedroom: no Heating: electric Cooling: window Pet: yes 1 dog x 2 yrs, Israel pig x 1 yr  Family History: Family History  Problem Relation Age of Onset  . Heart attack Mother   .  Hypertension Mother   . Obesity Mother   . Depression Mother   . Anxiety disorder Mother   . Bipolar disorder Mother   . Allergic rhinitis Mother   . Cancer Father   . Allergic rhinitis Father   . Allergic rhinitis Brother   .  Allergic rhinitis Maternal Grandmother   . Migraines Neg Hx   . Asthma Neg Hx   . Eczema Neg Hx   . Urticaria Neg Hx    Problem                               Relation Asthma                                   ? mother Allergic rhino conjunctivitis     Mother, father   Review of Systems  Constitutional: Negative for appetite change, chills, fever and unexpected weight change.  HENT: Positive for congestion, postnasal drip, rhinorrhea and sneezing.   Eyes: Positive for itching.  Respiratory: Positive for cough, chest tightness, shortness of breath and wheezing.   Cardiovascular: Negative for chest pain.  Gastrointestinal: Negative for abdominal pain.  Genitourinary: Negative for difficulty urinating.  Skin: Negative for rash.  Allergic/Immunologic: Positive for environmental allergies. Negative for food allergies.  Neurological: Negative for headaches.   Objective: BP (!) 162/98 (BP Location: Right Arm, Patient Position: Sitting, Cuff Size: Large)   Pulse 97   Temp 98 F (36.7 C) (Temporal)   Resp 16   Ht 5' 6.3" (1.684 m)   Wt 298 lb (135.2 kg)   SpO2 96%   BMI 47.67 kg/m  Body mass index is 47.67 kg/m. Physical Exam  Constitutional: She is oriented to person, place, and time. She appears well-developed and well-nourished.  HENT:  Head: Normocephalic and atraumatic.  Right Ear: External ear normal.  Left Ear: External ear normal.  Nose: Nose normal.  Mouth/Throat: Oropharynx is clear and moist.  Eyes: Conjunctivae and EOM are normal.  Cardiovascular: Normal rate, regular rhythm and normal heart sounds. Exam reveals no gallop and no friction rub.  No murmur heard. Pulmonary/Chest: Effort normal. She has wheezes. She has no rales.  Abdominal:  Soft.  Musculoskeletal:     Cervical back: Neck supple.  Neurological: She is alert and oriented to person, place, and time.  Skin: Skin is warm. No rash noted.  Psychiatric: She has a normal mood and affect. Her behavior is normal.  Nursing note and vitals reviewed.  The plan was reviewed with the patient/family, and all questions/concerned were addressed.  It was my pleasure to see Cassandra Tucker today and participate in her care. Please feel free to contact me with any questions or concerns.  Sincerely,  Wyline Mood, DO Allergy & Immunology  [facility] office: [phone] [facility] office: [phone] [place] office: [phone]

## 2019-11-06 NOTE — Assessment & Plan Note (Signed)
Perennial rhino conjunctivitis symptoms for 20+ years with worsening in the spring. No previous allergy/ENT evaluation. Tried Claritin, Flonase and Zyrtec with some benefit.  Today's skin testing showed: Positive to grass, dust mites, cats, feathers. Negative to common foods.   Start environmental control measures as below.  May use over the counter antihistamines such as Zyrtec (cetirizine), Claritin (loratadine), Allegra (fexofenadine), or Xyzal (levocetirizine) daily as needed.  May use Flonase 1 spray per nostril twice a day for nasal congestion.  May use azelastine 1-2 spray per nostril twice a day for runny nose.   If above regimen does not control symptoms then will discuss starting allergy immunotherapy once asthma is more stable.

## 2019-11-08 DIAGNOSIS — H5213 Myopia, bilateral: Secondary | ICD-10-CM | POA: Diagnosis not present

## 2019-11-08 DIAGNOSIS — H40033 Anatomical narrow angle, bilateral: Secondary | ICD-10-CM | POA: Diagnosis not present

## 2019-11-08 DIAGNOSIS — H16223 Keratoconjunctivitis sicca, not specified as Sjogren's, bilateral: Secondary | ICD-10-CM | POA: Diagnosis not present

## 2019-11-11 DIAGNOSIS — Z30431 Encounter for routine checking of intrauterine contraceptive device: Secondary | ICD-10-CM | POA: Diagnosis not present

## 2019-11-12 ENCOUNTER — Ambulatory Visit: Payer: Medicaid Other | Admitting: Allergy

## 2019-11-28 DIAGNOSIS — I10 Essential (primary) hypertension: Secondary | ICD-10-CM | POA: Diagnosis not present

## 2019-11-28 DIAGNOSIS — G932 Benign intracranial hypertension: Secondary | ICD-10-CM | POA: Diagnosis not present

## 2019-11-28 DIAGNOSIS — E785 Hyperlipidemia, unspecified: Secondary | ICD-10-CM | POA: Diagnosis not present

## 2019-12-03 ENCOUNTER — Encounter (HOSPITAL_COMMUNITY): Payer: Self-pay

## 2019-12-11 ENCOUNTER — Telehealth: Payer: Self-pay

## 2019-12-11 ENCOUNTER — Encounter: Payer: Self-pay | Admitting: Allergy

## 2019-12-11 DIAGNOSIS — J209 Acute bronchitis, unspecified: Secondary | ICD-10-CM

## 2019-12-11 DIAGNOSIS — R059 Cough, unspecified: Secondary | ICD-10-CM

## 2019-12-11 MED ORDER — EPINEPHRINE 0.3 MG/0.3ML IJ SOAJ: 0.3000 mg | Freq: Once | INTRAMUSCULAR | 2 refills | Status: AC

## 2019-12-11 MED ORDER — FEXOFENADINE HCL 180 MG PO TABS: 180.0000 mg | ORAL_TABLET | Freq: Every day | ORAL | 5 refills | Status: DC

## 2019-12-11 MED ORDER — ALBUTEROL SULFATE HFA 108 (90 BASE) MCG/ACT IN AERS: 1.0000 | INHALATION_SPRAY | RESPIRATORY_TRACT | 2 refills | Status: DC | PRN

## 2019-12-11 MED ORDER — ALBUTEROL SULFATE HFA 108 (90 BASE) MCG/ACT IN AERS: 2.0000 | INHALATION_SPRAY | RESPIRATORY_TRACT | 1 refills | Status: DC | PRN

## 2019-12-11 NOTE — Addendum Note (Signed)
Addended by: Florence Canner on: 12/11/2019 05:05 PM   Modules accepted: Orders

## 2019-12-11 NOTE — Telephone Encounter (Signed)
NO EPI SENT IN ERROR  PROVENTIL SENT IN

## 2019-12-11 NOTE — Telephone Encounter (Signed)
Sent in Mylan brand Epi into the pharmacy.

## 2019-12-15 ENCOUNTER — Telehealth: Payer: Self-pay | Admitting: Emergency Medicine

## 2019-12-15 ENCOUNTER — Other Ambulatory Visit: Payer: Self-pay

## 2019-12-15 ENCOUNTER — Ambulatory Visit
Admission: EM | Admit: 2019-12-15 | Discharge: 2019-12-15 | Disposition: A | Discharge status: Home / Self Care | Payer: Medicaid Other

## 2019-12-15 ENCOUNTER — Encounter: Payer: Self-pay | Admitting: Emergency Medicine

## 2019-12-15 DIAGNOSIS — J45901 Unspecified asthma with (acute) exacerbation: Secondary | ICD-10-CM

## 2019-12-15 MED ORDER — PREDNISONE 50 MG PO TABS: 50.0000 mg | ORAL_TABLET | Freq: Every day | ORAL | 0 refills | Status: DC

## 2019-12-15 MED ORDER — IPRATROPIUM-ALBUTEROL 0.5-2.5 (3) MG/3ML IN SOLN: 3.0000 mL | Freq: Four times a day (QID) | RESPIRATORY_TRACT | 0 refills | Status: DC | PRN

## 2019-12-15 NOTE — ED Triage Notes (Signed)
Pt presents to [facility] for assessment of shortness of breath, wheezing x 6+ months.  States she has an appointmnt with an asthma and allergy specialist in a few more weeks, but her husband was worried about her shortness of breath last night and patient states she is having left upper back and mid lower back pain from the coughing, breathing.

## 2019-12-15 NOTE — Discharge Instructions (Addendum)
Prednisone as directed.  Continue albuterol inhaler as needed.  Use DuoNeb when you get home, and another one before bedtime.  Then you can use sparingly as needed when albuterol inhaler is not working.  Continue your Flonase, add azelastine as prescribed by allergist.  Follow-up with allergist as scheduled for reevaluation.  If worsening symptoms, worsening shortness of breath, chest pain, go to the emergency department for further evaluation.

## 2019-12-15 NOTE — ED Provider Notes (Signed)
[name]    [name]: [id number] Arrival date & time: 12/15/19  1718      History   Chief Complaint Chief Complaint  Patient presents with  . Asthma    HPI Cassandra Tucker is a 29 y.o. female.   29 year old female comes in for 2-week history of asthma exacerbation.  States has been managing for asthma for the past 6+ months, when symptoms started while she was pregnant.  Symptoms has been intermittent.  For the past 2 weeks, has had worsening symptoms with shortness of breath at nighttime and first thing in the morning.  States does not wake up at night due to shortness of breath/cough.  But when waking up for urination, can feel short of breath.  She has nasal congestion at baseline without any changes.  Cough mostly at nighttime.  Denies fevers.  History of OSA on CPAP that was discontinued after gastric BiPAP.  Former smoker with 10-pack-year history.  Has been using albuterol with temporary relief.     Past Medical History:  Diagnosis Date  . Ankle fracture, left 2000  . Anxiety   . Arthritis    left wrist and left ankle  . Asthma   . Back pain   . Bronchitis   . Diabetes (HCC) 01/11/2016   type II  history of  . Family history of adverse reaction to anesthesia    gmother had problems with N/V   . GERD (gastroesophageal reflux disease)   . Headache   . History of kidney stones   . HLD (hyperlipidemia)   . HSV-2 infection   . Hypertension 2013  . Intracranial hypertension    psuedotumor cebrum  . Joint pain   . Kienbock's disease   . Leg edema   . OSA (obstructive sleep apnea)    cpap - does not know settings   . Papilledema   . Pregnancy induced hypertension   . RLS (restless legs syndrome)   . Sinus tachycardia    Patient Active Problem List   Diagnosis Date Noted  . Other allergic rhinitis 11/06/2019  . Allergic conjunctivitis of both eyes 11/06/2019  . Postpartum anemia 07/20/2019  . Postpartum care following cesarean delivery 2/4  07/18/2019  . Cesarean delivery -  IOL with fetal intolerance of labor and FTP 07/18/2019  . Marginal insertion of umbilical cord affecting management of mother 07/16/2019  . Chronic hypertension affecting pregnancy 07/01/2019  . Pregnancy 06/29/2019  . BMI 45.0-49.9, adult (HCC) 06/29/2019  . Pregnancy-induced hypertension 06/26/2019  . Anemia 05/21/2019  . Asthma, not well controlled, unspecified asthma severity, uncomplicated 05/18/2019  . Maternal obesity affecting pregnancy, antepartum 01/28/2019  . History of depression 01/28/2019  . Anxiety 01/26/2019  . History of bariatric surgery 01/13/2019  . Genital herpes simplex 01/13/2019  . Pregnant 12/25/2018  . Vitamin D deficiency 02/26/2017  . Class 3 obesity with serious comorbidity and body mass index (BMI) of 60.0 to 69.9 in adult 02/26/2017  . Controlled type 2 diabetes mellitus without complication, without long-term current use of insulin (HCC) 04/21/2016  . Gastroesophageal reflux disease without esophagitis 04/21/2016  . Pseudotumor cerebri syndrome 11/29/2015  . Super obesity 11/29/2015  . OSA on CPAP 11/29/2015  . RLS (restless legs syndrome) 11/29/2015  . Papilledema 06/16/2015  . Pain in the wrist 04/28/2014  . 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 HLD (hyperlipidemia) 01/21/2014  . Sinus tachycardia 12/04/2011  . Essential hypertension 12/04/2011  . Hypertension 12/04/2011  . Obesity, Class III, BMI 40-49.9 (morbid  obesity) (HCC) 11/07/2011    Past Surgical History:  Procedure Laterality Date  . CESAREAN SECTION N/A 07/17/2019   Procedure: CESAREAN SECTION;  Surgeon: Osborn Coho, MD;  Location: [facility] LD ORS;  Service: Obstetrics;  Laterality: N/A;  . FRACTURE SURGERY  2005   ankle  . FRACTURE SURGERY  2013   wrist (deteriorating bone)  . LAPAROSCOPIC GASTRIC SLEEVE RESECTION N/A 05/08/2017   Procedure: LAPAROSCOPIC GASTRIC SLEEVE RESECTION;  Surgeon: Berna Bue, MD;  Location: [facility] ORS;  Service: General;   Laterality: N/A;  . LAPAROSCOPIC GASTRIC SLEEVE RESECTION  05/08/2017  . UPPER GI ENDOSCOPY  05/08/2017   Procedure: UPPER GI ENDOSCOPY;  Surgeon: Berna Bue, MD;  Location: [facility] ORS;  Service: General;;  . urethra stretch    . WRIST SURGERY  2015    OB History    Gravida  1   Para  1   Term  1   Preterm  0   AB  0   Living  1     SAB  0   TAB  0   Ectopic  0   Multiple  0   Live Births  1            Home Medications    Prior to Admission medications   Medication Sig Start Date End Date Taking? Authorizing Provider  levocetirizine (XYZAL) 5 MG tablet Take 5 mg by mouth every evening.   Yes [provider]  albuterol (PROAIR HFA) 108 (90 Base) MCG/ACT inhaler Inhale 2 puffs into the lungs every 4 (four) hours as needed for wheezing or shortness of breath. 12/11/19   Ellamae Sia, DO  albuterol (PROVENTIL HFA;VENTOLIN HFA) 108 (90 Base) MCG/ACT inhaler Inhale 2 puffs into the lungs every 6 (six) hours as needed for wheezing or shortness of breath. 05/26/16   Remus Loffler, PA-C  azelastine (ASTELIN) 0.1 % nasal spray Place 1-2 sprays into both nostrils 2 (two) times daily as needed (runny nose, drainage). Use in each nostril as directed 11/06/19   Ellamae Sia, DO  budesonide-formoterol [facility]) 80-4.5 MCG/ACT inhaler Inhale 2 puffs into the lungs in the morning and at bedtime. with spacer and rinse mouth afterwards. 11/06/19   Ellamae Sia, DO  fexofenadine (ALLEGRA) 180 MG tablet Take 1 tablet (180 mg total) by mouth daily. 12/11/19   Ellamae Sia, DO  fluticasone (FLONASE) 50 MCG/ACT nasal spray Place 2 sprays into both nostrils daily. 10/03/19   Daphine Deutscher, Mary-Margaret, FNP  ipratropium-albuterol (DUONEB) 0.5-2.5 (3) MG/3ML SOLN Take 3 mLs by nebulization every 6 (six) hours as needed. 12/15/19   Belinda Fisher, PA-C  levonorgestrel (MIRENA, 52 MG,) 20 MCG/24HR IUD Mirena 20 mcg/24 hours (6 yrs) 52 mg intrauterine device  Take 1 device by intrauterine route.     [provider]  loratadine (CLARITIN) 10 MG tablet Take 1 tablet (10 mg total) by mouth daily. 10/03/19   Daphine Deutscher, Mary-Margaret, FNP  NIFEdipine (ADALAT CC) 60 MG 24 hr tablet Take 60 mg by mouth every morning.    [provider]  NIFEdipine (PROCARDIA XL/NIFEDICAL-XL) 90 MG 24 hr tablet Take 1 tablet (90 mg total) by mouth daily. 07/20/19   Dale [place], FNP  predniSONE (DELTASONE) 50 MG tablet Take 1 tablet (50 mg total) by mouth daily with breakfast. 12/15/19   Cathie Hoops, Michaline Kindig V, PA-C  terconazole (TERAZOL 7) 0.4 % vaginal cream terconazole 0.4 % vaginal cream   1 applicatorful every day by vaginal route.  [provider]  cetirizine (ZYRTEC) 10 MG tablet cetirizine 10 mg tablet  TAKE 1 TABLET BY MOUTH EVERY DAY  12/15/19  [provider]    Family History Family History  Problem Relation Age of Onset  . Heart attack Mother   . Hypertension Mother   . Obesity Mother   . Depression Mother   . Anxiety disorder Mother   . Bipolar disorder Mother   . Allergic rhinitis Mother   . Cancer Father   . Allergic rhinitis Father   . Allergic rhinitis Brother   . Allergic rhinitis Maternal Grandmother   . Migraines Neg Hx   . Asthma Neg Hx   . Eczema Neg Hx   . Urticaria Neg Hx     Social History Social History   Tobacco Use  . Smoking status: Former Smoker    Packs/day: 1.00    Types: Cigarettes    Quit date: 10/05/2018    Years since quitting: 1.1  . Smokeless tobacco: Never Used  Vaping Use  . Vaping Use: Never used  Substance Use Topics  . Alcohol use: Yes    Alcohol/week: 0.0 standard drinks    Comment: socially  . Drug use: Yes    Types: Marijuana    Comment: early in pregnancy for nausea     Allergies   Patient has no known allergies.   Review of Systems Review of Systems  Reason unable to perform ROS: See HPI as above.     Physical Exam Triage Vital Signs ED Triage Vitals  Enc Vitals Group     BP 12/15/19 1731 (!) 160/107      Pulse Rate 12/15/19 1731 90     Resp 12/15/19 1731 18     Temp 12/15/19 1731 98.5 F (36.9 C)     Temp Source 12/15/19 1731 Oral     SpO2 12/15/19 1731 98 %     Weight --      Height --      Head Circumference --      Peak Flow --      Pain Score 12/15/19 1736 7     Pain Loc --      Pain Edu? --      Excl. in GC? --    No data found.  Updated Vital Signs BP (!) 160/107 (BP Location: Left Arm)   Pulse 90   Temp 98.5 F (36.9 C) (Oral)   Resp 18   SpO2 98%   Physical Exam Constitutional:      General: She is not in acute distress.    Appearance: Normal appearance. She is not ill-appearing, toxic-appearing or diaphoretic.  HENT:     Head: Normocephalic and atraumatic.  Cardiovascular:     Rate and Rhythm: Normal rate and regular rhythm.     Heart sounds: Normal heart sounds. No murmur heard.  No friction rub. No gallop.   Pulmonary:     Effort: Pulmonary effort is normal. No accessory muscle usage, prolonged expiration, respiratory distress or retractions.     Comments: Lungs with inspiratory and expiratory wheezing throughout.  No rhonchi, rales. Musculoskeletal:     Cervical back: Normal range of motion and neck supple.  Neurological:     General: No focal deficit present.     Mental Status: She is alert and oriented to person, place, and time.      UC Treatments / Results  Labs (all labs ordered are listed, but only abnormal results are displayed) Labs Reviewed -  No data to display  EKG   Radiology No results found.  Procedures Procedures (including critical care time)  Medications Ordered in UC Medications - No data to display  Initial Impression / Assessment and Plan / UC Course  I have reviewed the triage vital signs and the nursing notes.  Pertinent labs & imaging results that were available during my care of the patient were reviewed by me and considered in my medical decision making (see chart for details).    Due to current Covid pandemic,  unable to do DuoNeb in office.  Will provide nebulizer machine, patient to start DuoNeb's at home.  Start prednisone as directed.  Other symptomatic treatment discussed.  Return precautions given.  Patient expresses understanding and agrees to plan.  Final Clinical Impressions(s) / UC Diagnoses   Final diagnoses:  Asthma with acute exacerbation, unspecified asthma severity, unspecified whether persistent    ED Prescriptions    Medication Sig Dispense Auth. Provider   predniSONE (DELTASONE) 50 MG tablet Take 1 tablet (50 mg total) by mouth daily with breakfast. 5 tablet Cherlynn Popiel V, PA-C   ipratropium-albuterol (DUONEB) 0.5-2.5 (3) MG/3ML SOLN Take 3 mLs by nebulization every 6 (six) hours as needed. 60 mL Belinda Fisher, PA-C     PDMP not reviewed this encounter.   Belinda Fisher, PA-C 12/15/19 1818

## 2019-12-15 NOTE — ED Notes (Signed)
Pt provided with nebulizer and items reviewed.  Forms signed.  Patient verbalized understanding.

## 2019-12-15 NOTE — ED Notes (Signed)
Patient able to ambulate independently  

## 2019-12-18 DIAGNOSIS — M9904 Segmental and somatic dysfunction of sacral region: Secondary | ICD-10-CM | POA: Diagnosis not present

## 2019-12-18 DIAGNOSIS — M9903 Segmental and somatic dysfunction of lumbar region: Secondary | ICD-10-CM | POA: Diagnosis not present

## 2019-12-18 DIAGNOSIS — M545 Low back pain: Secondary | ICD-10-CM | POA: Diagnosis not present

## 2019-12-18 DIAGNOSIS — M9905 Segmental and somatic dysfunction of pelvic region: Secondary | ICD-10-CM | POA: Diagnosis not present

## 2020-01-01 ENCOUNTER — Ambulatory Visit: Payer: Medicaid Other | Admitting: Allergy

## 2020-01-01 DIAGNOSIS — H1010 Acute atopic conjunctivitis, unspecified eye: Secondary | ICD-10-CM | POA: Insufficient documentation

## 2020-01-01 DIAGNOSIS — J3089 Other allergic rhinitis: Secondary | ICD-10-CM | POA: Insufficient documentation

## 2020-01-01 NOTE — Progress Notes (Deleted)
Follow Up Note  RE: Cassandra Tucker MRN: [id number] DOB: 24-May-1991 Date of Office Visit: 01/01/2020  Referring provider: [name], * Primary care provider: [name], FNP  Chief Complaint: No chief complaint on file.  History of Present Illness: I had the pleasure of seeing Cassandra Tucker for a follow up visit at the [facility] on 01/01/2020. She is a 29 y.o. female, who is being followed for asthma and allergic rhino conjunctivitis. Her previous allergy office visit was on 11/06/2019 with Dr. Selena Batten. Today is a regular follow up visit.  Asthma, not well controlled, unspecified asthma severity, uncomplicated Symptoms of chest tightness, shortness of breath, coughing, wheezing, nocturnal awakenings for 1 year. Daily symptoms and using albuterol 1-2 times a day. No daily ICS inhalers. History of reflux but no longer on PPI. Ex-smoker. Wheezing on exam today.   Today's spirometry showed: normal pattern and 13% improvement in FEV1 post bronchodilator treatment. Clinically feeling better.  Daily controller medication(s):start Symbicort 80 2 puffs twice a day and rinse mouth afterwards.  Spacer given and demonstrated proper use with inhaler. Patient understood technique and all questions/concerned were addressed.   May use albuterol rescue inhaler 2 puffs every 4 to 6 hours as needed for shortness of breath, chest tightness, coughing, and wheezing. May use albuterol rescue inhaler 2 puffs 5 to 15 minutes prior to strenuous physical activities. Monitor frequency of use.   Repeat spirometry at next visit.  Other allergic rhinitis Perennial rhino conjunctivitis symptoms for 20+ years with worsening in the spring. No previous allergy/ENT evaluation. Tried Claritin, Flonase and Zyrtec with some benefit.  Today's skin testing showed: Positive to grass, dust mites, cats, feathers. Negative to common foods.   Start environmental control measures as  below.  May use over the counter antihistamines such as Zyrtec (cetirizine), Claritin (loratadine), Allegra (fexofenadine), or Xyzal (levocetirizine) daily as needed.  May use Flonase 1 spray per nostril twice a day for nasal congestion.  May use azelastine 1-2 spray per nostril twice a day for runny nose.   If above regimen does not control symptoms then will discuss starting allergy immunotherapy once asthma is more stable.   Allergic conjunctivitis of both eyes  See assessment and plan as above for allergic rhinitis. Declines eye drops.   Assessment and Plan: Cassandra Tucker is a 29 y.o. female with: No problem-specific Assessment & Plan notes found for this encounter.  No follow-ups on file.  No orders of the defined types were placed in this encounter.  Lab Orders  No laboratory test(s) ordered today    Diagnostics: Spirometry:  Tracings reviewed. Her effort: {Blank single:19197::"Good reproducible efforts.","It was hard to get consistent efforts and there is a question as to whether this reflects a maximal maneuver.","Poor effort, data can not be interpreted."} FVC: ***L FEV1: ***L, ***% predicted FEV1/FVC ratio: ***% Interpretation: {Blank single:19197::"Spirometry consistent with mild obstructive disease","Spirometry consistent with moderate obstructive disease","Spirometry consistent with severe obstructive disease","Spirometry consistent with possible restrictive disease","Spirometry consistent with mixed obstructive and restrictive disease","Spirometry uninterpretable due to technique","Spirometry consistent with normal pattern","No overt abnormalities noted given today's efforts"}.  Please see scanned spirometry results for details.  Skin Testing: {Blank single:19197::"Select foods","Environmental allergy panel","Environmental allergy panel and select foods","Food allergy panel","None","Deferred due to recent antihistamines use"}. Positive test to: ***. Negative test to:  ***.  Results discussed with patient/family.   Medication List:  Current Outpatient Medications  Medication Sig Dispense Refill  . albuterol (PROAIR HFA) 108 (90 Base) MCG/ACT inhaler Inhale 2  puffs into the lungs every 4 (four) hours as needed for wheezing or shortness of breath. 18 g 1  . albuterol (PROVENTIL HFA;VENTOLIN HFA) 108 (90 Base) MCG/ACT inhaler Inhale 2 puffs into the lungs every 6 (six) hours as needed for wheezing or shortness of breath. 1 Inhaler 1  . azelastine (ASTELIN) 0.1 % nasal spray Place 1-2 sprays into both nostrils 2 (two) times daily as needed (runny nose, drainage). Use in each nostril as directed 30 mL 5  . budesonide-formoterol (SYMBICORT) 80-4.5 MCG/ACT inhaler Inhale 2 puffs into the lungs in the morning and at bedtime. with spacer and rinse mouth afterwards. 1 Inhaler 5  . fexofenadine (ALLEGRA) 180 MG tablet Take 1 tablet (180 mg total) by mouth daily. 30 tablet 5  . fluticasone (FLONASE) 50 MCG/ACT nasal spray Place 2 sprays into both nostrils daily. 16 g 6  . ipratropium-albuterol (DUONEB) 0.5-2.5 (3) MG/3ML SOLN Take 3 mLs by nebulization every 6 (six) hours as needed. 60 mL 0  . levocetirizine (XYZAL) 5 MG tablet Take 5 mg by mouth every evening.    Marland Kitchen levonorgestrel (MIRENA, 52 MG,) 20 MCG/24HR IUD Mirena 20 mcg/24 hours (6 yrs) 52 mg intrauterine device  Take 1 device by intrauterine route.    . loratadine (CLARITIN) 10 MG tablet Take 1 tablet (10 mg total) by mouth daily. 30 tablet 6  . NIFEdipine (ADALAT CC) 60 MG 24 hr tablet Take 60 mg by mouth every morning.    Marland Kitchen NIFEdipine (PROCARDIA XL/NIFEDICAL-XL) 90 MG 24 hr tablet Take 1 tablet (90 mg total) by mouth daily. 30 tablet 3  . predniSONE (DELTASONE) 50 MG tablet Take 1 tablet (50 mg total) by mouth daily with breakfast. 5 tablet 0  . terconazole (TERAZOL 7) 0.4 % vaginal cream terconazole 0.4 % vaginal cream   1 applicatorful every day by vaginal route.     No current facility-administered  medications for this visit.   Allergies: No Known Allergies I reviewed her past medical history, social history, family history, and environmental history and no significant changes have been reported from her previous visit.  Review of Systems  Constitutional: Negative for appetite change, chills, fever and unexpected weight change.  HENT: Positive for congestion, postnasal drip, rhinorrhea and sneezing.   Eyes: Positive for itching.  Respiratory: Positive for cough, chest tightness, shortness of breath and wheezing.   Cardiovascular: Negative for chest pain.  Gastrointestinal: Negative for abdominal pain.  Genitourinary: Negative for difficulty urinating.  Skin: Negative for rash.  Allergic/Immunologic: Positive for environmental allergies. Negative for food allergies.  Neurological: Negative for headaches.   Objective: There were no vitals taken for this visit. There is no height or weight on file to calculate BMI. Physical Exam Vitals and nursing note reviewed.  Constitutional:      Appearance: She is well-developed.  HENT:     Head: Normocephalic and atraumatic.     Right Ear: External ear normal.     Left Ear: External ear normal.     Nose: Nose normal.  Eyes:     Conjunctiva/sclera: Conjunctivae normal.  Cardiovascular:     Rate and Rhythm: Normal rate and regular rhythm.     Heart sounds: Normal heart sounds. No murmur heard.  No friction rub. No gallop.   Pulmonary:     Effort: Pulmonary effort is normal.     Breath sounds: Wheezing present. No rales.  Abdominal:     Palpations: Abdomen is soft.  Musculoskeletal:     Cervical back: Neck  supple.  Skin:    General: Skin is warm.     Findings: No rash.  Neurological:     Mental Status: She is alert and oriented to person, place, and time.  Psychiatric:        Behavior: Behavior normal.    Previous notes and tests were reviewed. The plan was reviewed with the patient/family, and all questions/concerned were  addressed.  It was my pleasure to see Makynlee today and participate in her care. Please feel free to contact me with any questions or concerns.  Sincerely,  Wyline Mood, DO Allergy & Immunology  [facility] office: [phone] [facility] office: [phone] [place] office: [phone]

## 2020-01-06 ENCOUNTER — Ambulatory Visit: Payer: Medicaid Other | Admitting: Allergy

## 2020-01-07 DIAGNOSIS — Z20822 Contact with and (suspected) exposure to covid-19: Secondary | ICD-10-CM | POA: Diagnosis not present

## 2020-01-07 NOTE — Progress Notes (Signed)
RE: Cassandra Tucker MRN: [id number] DOB: 28-Aug-1990 Date of Telemedicine Visit: 01/08/2020  Referring provider: Bennie Tucker, * Primary care provider: [name], FNP  Chief Complaint: Asthma (Horrible)   Telemedicine Follow Up Visit via Telephone: I connected with Cassandra Tucker for a follow up on 01/08/20 by telephone and verified that I am speaking with the correct person using two identifiers.   I discussed the limitations, risks, security and privacy concerns of performing an evaluation and management service by telephone and the availability of in person appointments. I also discussed with the patient that there may be a patient responsible charge related to this service. The patient expressed understanding and agreed to proceed.  Patient is at home/work.  Provider is at the office.  Visit start time: 3:00PM Visit end time: 3:35PM Insurance consent/check in by: front desk Medical consent and medical assistant/nurse: Lachelle S.  History of Present Illness: She is a 29 y.o. female, who is being followed for asthma and allergic rhino conjunctivitis. Her previous allergy office visit was on 11/06/2019 with Dr. Selena Tucker. Today is a regular follow up visit.  Asthma ACT score 8. Currently on Symbicort 2 puffs twice a day with some initial benefit but the past 3 weeks her asthma flared where she went to urgent care - coughing, wheezing, shortness of breath. She was treated with prednisone and albuterol nebulizer with some benefit.  Currently using albuterol nebulizer every night prior to going to bed and she is using albuterol inhaler 10-15 times per day the past 2-3 weeks.  No recent antibiotics but feels like there is chest congestion with clear mucous. No fevers or chills.   Daughter was sick 2 weeks ago. Patient works at [employer] [place]. Patient got tested yesterday but results are not back yet.  Allergic rhino conjunctivitis Currently on allegra  180mg  daily 1-2 times a day with some benefit. Not using Flonase. No nosebleeds.  Using azelastine nasal spray 2 sprays 1-2 times a day.   12/15/2019 UC HPI: "29 year old female comes in for 2-week history of asthma exacerbation.  States has been managing for asthma for the past 6+ months, when symptoms started while she was pregnant.  Symptoms has been intermittent.  For the past 2 weeks, has had worsening symptoms with shortness of breath at nighttime and first thing in the morning.  States does not wake up at night due to shortness of breath/cough.  But when waking up for urination, can feel short of breath.  She has nasal congestion at baseline without any changes.  Cough mostly at nighttime.  Denies fevers.  History of OSA on CPAP that was discontinued after gastric BiPAP.  Former smoker with 10-pack-year history.  Has been using albuterol with temporary relief."  Assessment and Plan: [name] is a 29 y.o. female with: Asthma, not well controlled, unspecified asthma severity, uncomplicated Past history - Symptoms of chest tightness, shortness of breath, coughing, wheezing, nocturnal awakenings for 1 year. Daily symptoms and using albuterol 1-2 times a day. No daily ICS inhalers. History of reflux but no longer on PPI. Ex-smoker. 2021 spirometry showed: normal pattern and 13% improvement in FEV1 post bronchodilator treatment. Clinically feeling better. Interim history - initially felt better with Symbicort but then had a flare and ended up at the urgent care on 7/5. Since then her breathing has been flaring up. She had testing for covid-19 yesterday and results pending. Converted in office visit to televisit.  ACT score 8. Prednisone 10mg  tablet pack: 2 tablets when  you get home. Take 2 more tablets before bed today.  Then take 2 tablets twice a day for 2 more days. Then take 2 tablets once a day for 1 day. Then take 1 tablet once a day for 1 day.  . Take albuterol nebulizer before using the  Symbicort inhaler for the next 2 weeks and may use additionally every 4-6 hours if needed. . Daily controller medication(s): increase Symbicort to  2 puffs twice a day and rinse mouth afterwards. . May use albuterol rescue inhaler 2 puffs every 4 to 6 hours as needed for shortness of breath, chest tightness, coughing, and wheezing. May use albuterol rescue inhaler 2 puffs 5 to 15 minutes prior to strenuous physical activities. Monitor frequency of use.  . If breathing does not improve after 2 back to back albuterol nebulizer treatment please go to the ER/urgent care.  . If develops a fever, discolored mucous let us know. . Repeat spirometry at next visit. . Will discuss getting bloodwork to see if she will meet criteria for biologics at next visit.   Seasonal and perennial allergic rhinoconjunctivitis Past history - Perennial rhino conjunctivitis symptoms for 20+ years with worsening in the spring. No previous ENT evaluation. Tried Claritin, Flonase and Zyrtec with some benefit. 2021 skin testing showed: Positive to grass, dust mites, cats, feathers. Negative to common foods.  Interim history - only using azelastine nasal spray. Still has nasal congestion. Allegra works the best but expensive wants a prescription antihistamine to take.  Continue environmental control measures as below.  May use over the counter antihistamines such as Zyrtec (cetirizine), Claritin (loratadine), Allegra (fexofenadine), or Xyzal (levocetirizine) daily. May take it twice a day if needed.   Sent in prescription for Xyzal to see if it's covered.   Use Flonase 1 spray per nostril twice a day for nasal congestion.  Use azelastine 1-2 spray per nostril twice a day for runny nose.   If above regimen does not control symptoms then will discuss starting allergy immunotherapy once asthma is more stable.   Return in about 4 weeks (around 02/05/2020).  Meds ordered this encounter  Medications  .  budesonide-formoterol (SYMBICORT) 160-4.5 MCG/ACT inhaler    Sig: Inhale 2 puffs into the lungs in the morning and at bedtime. with spacer and rinse mouth afterwards.    Dispense:  1 Inhaler    Refill:  5  . levocetirizine (XYZAL) 5 MG tablet    Sig: Take 1 tablet (5 mg total) by mouth every evening.    Dispense:  30 tablet    Refill:  5  . albuterol (PROAIR HFA) 108 (90 Base) MCG/ACT inhaler    Sig: Inhale 2 puffs into the lungs every 4 (four) hours as needed for wheezing or shortness of breath.    Dispense:  18 g    Refill:  0   Lab Orders  No laboratory test(s) ordered today    Diagnostics: None.  Medication List:  Current Outpatient Medications  Medication Sig Dispense Refill  . albuterol (PROAIR HFA) 108 (90 Base) MCG/ACT inhaler Inhale 2 puffs into the lungs every 4 (four) hours as needed for wheezing or shortness of breath. 18 g 0  . azelastine (ASTELIN) 0.1 % nasal spray Place 1-2 sprays into both nostrils 2 (two) times daily as needed (runny nose, drainage). Use in each nostril as directed 30 mL 5  . fexofenadine (ALLEGRA) 180 MG tablet Take 1 tablet (180 mg total) by mouth daily. 30 tablet 5  .  fluticasone (FLONASE) 50 MCG/ACT nasal spray Place 2 sprays into both nostrils daily. 16 g 6  . ipratropium-albuterol (DUONEB) 0.5-2.5 (3) MG/3ML SOLN Take 3 mLs by nebulization every 6 (six) hours as needed. 60 mL 0  . levocetirizine (XYZAL) 5 MG tablet Take 1 tablet (5 mg total) by mouth every evening. 30 tablet 5  . levonorgestrel (MIRENA, 52 MG,) 20 MCG/24HR IUD Mirena 20 mcg/24 hours (6 yrs) 52 mg intrauterine device  Take 1 device by intrauterine route.    . budesonide-formoterol (SYMBICORT) 160-4.5 MCG/ACT inhaler Inhale 2 puffs into the lungs in the morning and at bedtime. with spacer and rinse mouth afterwards. 1 Inhaler 5  . NIFEdipine (ADALAT CC) 60 MG 24 hr tablet Take 60 mg by mouth every morning.    Marland Kitchen NIFEdipine (PROCARDIA XL/NIFEDICAL-XL) 90 MG 24 hr tablet Take 1  tablet (90 mg total) by mouth daily. 30 tablet 3  . predniSONE (DELTASONE) 50 MG tablet Take 1 tablet (50 mg total) by mouth daily with breakfast. 5 tablet 0  . terconazole (TERAZOL 7) 0.4 % vaginal cream terconazole 0.4 % vaginal cream   1 applicatorful every day by vaginal route.     No current facility-administered medications for this visit.   Allergies: Allergies  Allergen Reactions  . Cats Claw (Uncaria Tomentosa) Itching, Shortness Of Breath and Swelling  . Dust Mite Extract Itching and Shortness Of Breath  . Grass Pollen(K-O-R-T-Swt Vern) Itching and Shortness Of Breath  . Mixed Feathers Itching, Shortness Of Breath and Swelling   I reviewed her past medical history, social history, family history, and environmental history and no significant changes have been reported from her previous visit.  Review of Systems  Constitutional: Negative for appetite change, chills, fever and unexpected weight change.  HENT: Positive for congestion, postnasal drip, rhinorrhea and sneezing.   Eyes: Positive for itching.  Respiratory: Positive for cough, chest tightness, shortness of breath and wheezing.   Cardiovascular: Negative for chest pain.  Gastrointestinal: Negative for abdominal pain.  Genitourinary: Negative for difficulty urinating.  Skin: Negative for rash.  Allergic/Immunologic: Positive for environmental allergies. Negative for food allergies.  Neurological: Negative for headaches.   Objective: Physical Exam Not obtained as encounter was done via telephone.   Previous notes and tests were reviewed.  I discussed the assessment and treatment plan with the patient. The patient was provided an opportunity to ask questions and all were answered. The patient agreed with the plan and demonstrated an understanding of the instructions. After visit summary/patient instructions available via printed out.   The patient was advised to call back or seek an in-person evaluation if the  symptoms worsen or if the condition fails to improve as anticipated.  I provided 35 minutes of non-face-to-face time during this encounter.  It was my pleasure to participate in [facility]'s care today. Please feel free to contact me with any questions or concerns.   Sincerely,  Wyline Mood, DO Allergy & Immunology  [facility] office: [phone] [facility] office: [phone] [place] office: [phone]

## 2020-01-08 ENCOUNTER — Encounter: Payer: Self-pay | Admitting: Allergy

## 2020-01-08 ENCOUNTER — Ambulatory Visit (INDEPENDENT_AMBULATORY_CARE_PROVIDER_SITE_OTHER): Payer: Medicaid Other | Admitting: Allergy

## 2020-01-08 ENCOUNTER — Other Ambulatory Visit: Payer: Self-pay

## 2020-01-08 DIAGNOSIS — J302 Other seasonal allergic rhinitis: Secondary | ICD-10-CM

## 2020-01-08 DIAGNOSIS — J3089 Other allergic rhinitis: Secondary | ICD-10-CM | POA: Diagnosis not present

## 2020-01-08 DIAGNOSIS — H1010 Acute atopic conjunctivitis, unspecified eye: Secondary | ICD-10-CM

## 2020-01-08 DIAGNOSIS — J45909 Unspecified asthma, uncomplicated: Secondary | ICD-10-CM

## 2020-01-08 MED ORDER — ALBUTEROL SULFATE HFA 108 (90 BASE) MCG/ACT IN AERS: 2.0000 | INHALATION_SPRAY | RESPIRATORY_TRACT | 0 refills | Status: DC | PRN

## 2020-01-08 MED ORDER — LEVOCETIRIZINE DIHYDROCHLORIDE 5 MG PO TABS: 5.0000 mg | ORAL_TABLET | Freq: Every evening | ORAL | 5 refills | Status: DC

## 2020-01-08 MED ORDER — BUDESONIDE-FORMOTEROL FUMARATE 160-4.5 MCG/ACT IN AERO: 2.0000 | INHALATION_SPRAY | Freq: Two times a day (BID) | RESPIRATORY_TRACT | 5 refills | Status: DC

## 2020-01-08 NOTE — Assessment & Plan Note (Signed)
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 No previous ENT evaluation. Tried Claritin, Flonase and Zyrtec with some benefit. 2021 skin testing showed: Positive to grass, dust mites, cats, feathers. Negative to common foods.  Interim history - only using azelastine nasal spray. Still has nasal congestion. Allegra works the best but expensive wants a prescription antihistamine to take.  Continue environmental control measures as below.  May use over the counter antihistamines such as Zyrtec (cetirizine), Claritin (loratadine), Allegra (fexofenadine), or Xyzal (levocetirizine) daily. May take it twice a day if needed.   Sent in prescription for Xyzal to see if it's covered.   Use Flonase 1 spray per nostril twice a day for nasal congestion.  Use azelastine 1-2 spray per nostril twice a day for runny nose.   If above regimen does not control symptoms then will discuss starting allergy immunotherapy once asthma is more stable.

## 2020-01-08 NOTE — Assessment & Plan Note (Addendum)
Past history - Symptoms of chest tightness, shortness of breath, coughing, wheezing, nocturnal awakenings for 1 year. Daily symptoms and using albuterol 1-2 times a day. No daily ICS inhalers. History of reflux but no longer on PPI. Ex-smoker. 2021 spirometry showed: normal pattern and 13% improvement in FEV1 post bronchodilator treatment. Clinically feeling better. Interim history - initially felt better with Symbicort but then had a flare and ended up at the urgent care on 7/5. Since then her breathing has been flaring up. She had testing for covid-19 yesterday and results pending. Converted in office visit to televisit.  ACT score 8. Prednisone 10mg  tablet pack: 2 tablets when you get home. Take 2 more tablets before bed today.  Then take 2 tablets twice a day for 2 more days. Then take 2 tablets once a day for 1 day. Then take 1 tablet once a day for 1 day.  . Take albuterol nebulizer before using the Symbicort inhaler for the next 2 weeks and may use additionally every 4-6 hours if needed. . Daily controller medication(s): increase Symbicort to  2 puffs twice a day and rinse mouth afterwards. . May use albuterol rescue inhaler 2 puffs every 4 to 6 hours as needed for shortness of breath, chest tightness, coughing, and wheezing. May use albuterol rescue inhaler 2 puffs 5 to 15 minutes prior to strenuous physical activities. Monitor frequency of use.  . If breathing does not improve after 2 back to back albuterol nebulizer treatment please go to the ER/urgent care.  . If develops a fever, discolored mucous let know. . Repeat spirometry at next visit. . Will discuss getting bloodwork to see if she will meet criteria for biologics at next visit.

## 2020-01-08 NOTE — Patient Instructions (Addendum)
Environmental allergies  Past skin testing showed: Positive to grass, dust mites, cats, feathers.  Continue environmental control measures as below.  May use over the counter antihistamines such as Zyrtec (cetirizine), Claritin (loratadine), Allegra (fexofenadine), or Xyzal (levocetirizine) daily. May take it twice a day if needed.   Use Flonase 1 spray per nostril twice a day for nasal congestion.  Use azelastine 1-2 spray per nostril twice a day for runny nose.   Asthma: Prednisone 10mg  tablet pack: 2 tablets when you get home. Take 2 more tablets before bed today.  Then take 2 tablets twice a day for 2 more days. Then take 2 tablets once a day for 1 day. Then take 1 tablet once a day for 1 day.   . Take albuterol nebulizer before using the Symbicort inhaler for the next 2 weeks and may use additionally every 4-6 hours if needed. . Daily controller medication(s): increase Symbicort  2 puffs twice a day and rinse mouth afterwards. . May use albuterol rescue inhaler 2 puffs every 4 to 6 hours as needed for shortness of breath, chest tightness, coughing, and wheezing. May use albuterol rescue inhaler 2 puffs 5 to 15 minutes prior to strenuous physical activities. Monitor frequency of use.  . Asthma control goals:  o Full participation in all desired activities (may need albuterol before activity) o Albuterol use two times or less a week on average (not counting use with activity) o Cough interfering with sleep two times or less a month o Oral steroids no more than once a year o No hospitalizations  If your breathing does not improve after 2 back to back albuterol nebulizer treatment please go to the ER/urgent care.  If you develop fevers, discolored mucous let know.  Follow up in 1 month or sooner if needed.   Reducing Pollen Exposure . Pollen seasons: trees (spring), grass (summer) and ragweed/weeds (fall). 10-19-1979 Keep windows closed in your home and car to lower pollen  exposure.  Marland Kitchen air conditioning in the bedroom and throughout the house if possible.  . Avoid going out in dry windy days - especially early morning. . Pollen counts are highest between 5 - 10 AM and on dry, hot and windy days.  . Save outside activities for late afternoon or after a heavy rain, when pollen levels are lower.  . Avoid mowing of grass if you have grass pollen allergy. [name] Be aware that pollen can also be transported indoors on people and pets.  . Dry your clothes in an automatic dryer rather than hanging them outside where they might collect pollen.  . Rinse hair and eyes before bedtime. Control of House Dust Mite Allergen . Dust mite allergens are a common trigger of allergy and asthma symptoms. While they can be found throughout the house, these microscopic creatures thrive in warm, humid environments such as bedding, upholstered furniture and carpeting. . Because so much time is spent in the bedroom, it is essential to reduce mite levels there.  . Encase pillows, mattresses, and box springs in special allergen-proof fabric covers or airtight, zippered plastic covers.  . Bedding should be washed weekly in hot water (130 F) and dried in a hot dryer. Allergen-proof covers are available for comforters and pillows that can't be regularly washed.  Marland Kitchen the allergy-proof covers every few months. Minimize clutter in the bedroom. Keep pets out of the bedroom.  [name] Keep humidity less than 50% by using a dehumidifier or air conditioning. You can  buy a humidity measuring device called a hygrometer to monitor this.  . If possible, replace carpets with hardwood, linoleum, or washable area rugs. If that's not possible, vacuum frequently with a vacuum that has a HEPA filter. . Remove all upholstered furniture and non-washable window drapes from the bedroom. . Remove all non-washable stuffed toys from the bedroom.  Wash stuffed toys weekly. Pet Allergen Avoidance: . Contrary to popular  opinion, there are no "hypoallergenic" breeds of dogs or cats. That is because people are not allergic to an animal's hair, but to an allergen found in the animal's saliva, dander (dead skin flakes) or urine. Pet allergy symptoms typically occur within minutes. For some people, symptoms can build up and become most severe 8 to 12 hours after contact with the animal. People with severe allergies can experience reactions in public places if dander has been transported on the pet owners' clothing. Marland Kitchen Keeping an animal outdoors is only a partial solution, since homes with pets in the yard still have higher concentrations of animal allergens. . Before getting a pet, ask your allergist to determine if you are allergic to animals. If your pet is already considered part of your family, try to minimize contact and keep the pet out of the bedroom and other rooms where you spend a great deal of time. . As with dust mites, vacuum carpets often or replace carpet with a hardwood floor, tile or linoleum. . High-efficiency particulate air (HEPA) cleaners can reduce allergen levels over time. . While dander and saliva are the source of cat and dog allergens, urine is the source of allergens from rabbits, hamsters, mice and [place] pigs; so ask a non-allergic family member to clean the animal's cage. . If you have a pet allergy, talk to your allergist about the potential for allergy immunotherapy (allergy shots). This strategy can often provide long-term relief.

## 2020-01-16 ENCOUNTER — Ambulatory Visit (INDEPENDENT_AMBULATORY_CARE_PROVIDER_SITE_OTHER): Payer: Medicaid Other | Admitting: Nurse Practitioner

## 2020-01-16 ENCOUNTER — Encounter: Payer: Self-pay | Admitting: Nurse Practitioner

## 2020-01-16 NOTE — Progress Notes (Signed)
   Virtual Visit via telephone Note Due to COVID-19 pandemic this visit was conducted virtually. This visit type was conducted due to national recommendations for restrictions regarding the COVID-19 Pandemic (e.g. social distancing, sheltering in place) in an effort to limit this patient's exposure and mitigate transmission in our community. All issues noted in this document were discussed and addressed.  A physical exam was not performed with this format.  I connected with Lacreshia M Berti on 01/16/20 at 8:05 by telephone and verified that I am speaking with the correct person using two identifiers. Winfred M Wender is currently located at home and no one is currently with  her during visit. The provider, Mary-Margaret Daphine Deutscher, FNP is located in their office at time of visit.  I discussed the limitations, risks, security and privacy concerns of performing an evaluation and management service by telephone and the availability of in person appointments. I also discussed with the patient that there may be a patient responsible charge related to this service. The patient expressed understanding and agreed to proceed.   History and Present Illness:   Chief Complaint: Weight Check   HPI Patient is concerned about her weight . She has [facility] in [place]. She lost 200lbs in the past with them and needs to go back. Needs referral so insurance will pay for it. Her current weight is 308lbs, which is up 10lbs from previous.  Wt Readings from Last 3 Encounters:  11/06/19 298 lb (135.2 kg)  07/16/19 294 lb 6.4 oz (133.5 kg)  06/29/19 288 lb (130.6 kg)       Review of Systems  Constitutional: Negative for diaphoresis and weight loss.  Eyes: Negative for blurred vision, double vision and pain.  Respiratory: Negative for shortness of breath.   Cardiovascular: Negative for chest pain, palpitations, orthopnea and leg swelling.  Gastrointestinal: Negative for  abdominal pain.  Skin: Negative for rash.  Neurological: Negative for dizziness, sensory change, loss of consciousness, weakness and headaches.  Endo/Heme/Allergies: Negative for polydipsia. Does not bruise/bleed easily.  Psychiatric/Behavioral: Negative for memory loss. The patient does not have insomnia.   All other systems reviewed and are negative.    Observations/Objective: Alert and oriented- answers all questions appropriately No distress    Assessment and Plan: Stevey M Gazzola in today with chief complaint of Weight Check   1. Morbid obesity (HCC) Referral made - Amb Ref to Medical Weight Management     Follow Up Instructions: prn    I discussed the assessment and treatment plan with the patient. The patient was provided an opportunity to ask questions and all were answered. The patient agreed with the plan and demonstrated an understanding of the instructions.   The patient was advised to call back or seek an in-person evaluation if the symptoms worsen or if the condition fails to improve as anticipated.  The above assessment and management plan was discussed with the patient. The patient verbalized understanding of and has agreed to the management plan. Patient is aware to call the clinic if symptoms persist or worsen. Patient is aware when to return to the clinic for a follow-up visit. Patient educated on when it is appropriate to go to the emergency department.   Time call ended:  8:20  I provided 15 minutes of non-face-to-face time during this encounter.    Mary-Margaret Daphine Deutscher, FNP

## 2020-01-27 DIAGNOSIS — G932 Benign intracranial hypertension: Secondary | ICD-10-CM | POA: Diagnosis not present

## 2020-01-27 DIAGNOSIS — H4710 Unspecified papilledema: Secondary | ICD-10-CM | POA: Diagnosis not present

## 2020-01-28 ENCOUNTER — Encounter: Payer: Self-pay | Admitting: Emergency Medicine

## 2020-01-28 ENCOUNTER — Other Ambulatory Visit: Payer: Self-pay

## 2020-01-28 ENCOUNTER — Ambulatory Visit
Admission: EM | Admit: 2020-01-28 | Discharge: 2020-01-28 | Disposition: A | Discharge status: Home / Self Care | Payer: Medicaid Other | Attending: Emergency Medicine | Admitting: Emergency Medicine

## 2020-01-28 DIAGNOSIS — R519 Headache, unspecified: Secondary | ICD-10-CM | POA: Diagnosis not present

## 2020-01-28 MED ORDER — DEXAMETHASONE SODIUM PHOSPHATE 10 MG/ML IJ SOLN
10.0000 mg | Freq: Once | INTRAMUSCULAR | Status: AC
  Administered 2020-01-28: 10 mg via INTRAMUSCULAR

## 2020-01-28 MED ORDER — KETOROLAC TROMETHAMINE 30 MG/ML IJ SOLN
30.0000 mg | Freq: Once | INTRAMUSCULAR | Status: AC
  Administered 2020-01-28: 30 mg via INTRAMUSCULAR

## 2020-01-28 MED ORDER — ONDANSETRON 4 MG PO TBDP
4.0000 mg | ORAL_TABLET | Freq: Once | ORAL | Status: AC
  Administered 2020-01-28: 4 mg via ORAL

## 2020-01-28 NOTE — Discharge Instructions (Signed)
You were given medications today for your headache. Important to keep a log of your headaches: When they start, what alleviates them, pain on a scale of 1-10. Bring your headache log to your primary care for further evaluation as you may need to be on medications to help prevent headaches. Go to ER for worse headache of life, loss/change of vision, vomiting, fever, ear ringing, dizziness, weakness, facial droop/slurred speech, severe abdominal pain. 

## 2020-01-28 NOTE — ED Provider Notes (Signed)
[name]    [name]: [id number] Arrival date & time: 01/28/20  1312      History   Chief Complaint Chief Complaint  Patient presents with  . Headache    HPI Cassandra Tucker is a 29 y.o. female presenting for generalized headache s/p spinal tap yesterday.  Patient underwent LP for IIH (followed by neurology).  Tolerated this well.  Last LP was several years ago: Had a similar headache thereafter without sequela.  Patient denies worst headache of life, change in vision or hearing, dizziness, nausea, vomiting, photophobia or phonophobia, numbness, slurred speech, weakness.  Requesting headache cocktail which she has received in the ER for similar episodes and has tolerated well.   Past Medical History:  Diagnosis Date  . Ankle fracture, left 2000  . Anxiety   . Arthritis    left wrist and left ankle  . Asthma   . Back pain   . Bronchitis   . Diabetes (HCC) 01/11/2016   type II  history of  . Family history of adverse reaction to anesthesia    gmother had problems with N/V   . GERD (gastroesophageal reflux disease)   . Headache   . History of kidney stones   . HLD (hyperlipidemia)   . HSV-2 infection   . Hypertension 2013  . Intracranial hypertension    psuedotumor cebrum  . Joint pain   . Kienbock's disease   . Leg edema   . OSA (obstructive sleep apnea)    cpap - does not know settings   . Papilledema   . Pregnancy induced hypertension   . RLS (restless legs syndrome)   . Sinus tachycardia     Patient Active Problem List   Diagnosis Date Noted  . Seasonal and perennial allergic rhinoconjunctivitis 01/01/2020  . Postpartum anemia 07/20/2019  . Postpartum care following cesarean delivery 2/4 07/18/2019  . Cesarean delivery -  IOL with fetal intolerance of labor and FTP 07/18/2019  . Marginal insertion of umbilical cord affecting management of mother 07/16/2019  . Chronic hypertension affecting pregnancy 07/01/2019  . Pregnancy 06/29/2019  . BMI  45.0-49.9, adult (HCC) 06/29/2019  . Pregnancy-induced hypertension 06/26/2019  . Anemia 05/21/2019  . Asthma, not well controlled, unspecified asthma severity, uncomplicated 05/18/2019  . Maternal obesity affecting pregnancy, antepartum 01/28/2019  . History of depression 01/28/2019  . Anxiety 01/26/2019  . History of bariatric surgery 01/13/2019  . Genital herpes simplex 01/13/2019  . Pregnant 12/25/2018  . Vitamin D deficiency 02/26/2017  . Class 3 obesity with serious comorbidity and body mass index (BMI) of 60.0 to 69.9 in adult 02/26/2017  . Controlled type 2 diabetes mellitus without complication, without long-term current use of insulin (HCC) 04/21/2016  . Gastroesophageal reflux disease without esophagitis 04/21/2016  . Pseudotumor cerebri syndrome 11/29/2015  . Super obesity 11/29/2015  . OSA on CPAP 11/29/2015  . RLS (restless legs syndrome) 11/29/2015  . Papilledema 06/16/2015  . Pain in the wrist 04/28/2014  . 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 HLD (hyperlipidemia) 01/21/2014  . Sinus tachycardia 12/04/2011  . Essential hypertension 12/04/2011  . Hypertension 12/04/2011  . Obesity, Class III, BMI 40-49.9 (morbid obesity) (HCC) 11/07/2011    Past Surgical History:  Procedure Laterality Date  . CESAREAN SECTION N/A 07/17/2019   Procedure: CESAREAN SECTION;  Surgeon: Osborn Coho, MD;  Location: [facility] LD ORS;  Service: Obstetrics;  Laterality: N/A;  . FRACTURE SURGERY  2005   ankle  . FRACTURE SURGERY  2013   wrist (deteriorating  bone)  . LAPAROSCOPIC GASTRIC SLEEVE RESECTION N/A 05/08/2017   Procedure: LAPAROSCOPIC GASTRIC SLEEVE RESECTION;  Surgeon: Berna Bueonnor, Chelsea A, MD;  Location: [facility] ORS;  Service: General;  Laterality: N/A;  . LAPAROSCOPIC GASTRIC SLEEVE RESECTION  05/08/2017  . UPPER GI ENDOSCOPY  05/08/2017   Procedure: UPPER GI ENDOSCOPY;  Surgeon: Berna Bueonnor, Chelsea A, MD;  Location: [facility] ORS;  Service: General;;  . urethra stretch    . WRIST SURGERY  2015     OB History    Gravida  1   Para  1   Term  1   Preterm  0   AB  0   Living  1     SAB  0   TAB  0   Ectopic  0   Multiple  0   Live Births  1            Home Medications    Prior to Admission medications   Medication Sig Start Date End Date Taking? Authorizing Provider  albuterol (PROAIR HFA) 108 (90 Base) MCG/ACT inhaler Inhale 2 puffs into the lungs every 4 (four) hours as needed for wheezing or shortness of breath. 01/08/20   Ellamae SiaKim, Yoon M, DO  azelastine (ASTELIN) 0.1 % nasal spray Place 1-2 sprays into both nostrils 2 (two) times daily as needed (runny nose, drainage). Use in each nostril as directed 11/06/19   Ellamae SiaKim, Yoon M, DO  budesonide-formoterol [facility](SYMBICORT) 160-4.5 MCG/ACT inhaler Inhale 2 puffs into the lungs in the morning and at bedtime. with spacer and rinse mouth afterwards. 01/08/20   Ellamae SiaKim, Yoon M, DO  fexofenadine (ALLEGRA) 180 MG tablet Take 1 tablet (180 mg total) by mouth daily. 12/11/19   Ellamae SiaKim, Yoon M, DO  fluticasone (FLONASE) 50 MCG/ACT nasal spray Place 2 sprays into both nostrils daily. 10/03/19   Daphine DeutscherMartin, Mary-Margaret, FNP  ipratropium-albuterol (DUONEB) 0.5-2.5 (3) MG/3ML SOLN Take 3 mLs by nebulization every 6 (six) hours as needed. 12/15/19   Belinda FisherYu, Amy V, PA-C  levocetirizine (XYZAL) 5 MG tablet Take 1 tablet (5 mg total) by mouth every evening. 01/08/20   Ellamae SiaKim, Yoon M, DO  levonorgestrel (MIRENA, 52 MG,) 20 MCG/24HR IUD Mirena 20 mcg/24 hours (6 yrs) 52 mg intrauterine device  Take 1 device by intrauterine route.    [provider]  NIFEdipine (ADALAT CC) 60 MG 24 hr tablet Take 60 mg by mouth every morning.    [provider]  NIFEdipine (PROCARDIA XL/NIFEDICAL-XL) 90 MG 24 hr tablet Take 1 tablet (90 mg total) by mouth daily. 07/20/19   Dale DurhamMontana, Jade, FNP  terconazole (TERAZOL 7) 0.4 % vaginal cream terconazole 0.4 % vaginal cream   1 applicatorful every day by vaginal route.    [provider]  cetirizine (ZYRTEC) 10 MG tablet  cetirizine 10 mg tablet  TAKE 1 TABLET BY MOUTH EVERY DAY  12/15/19  [provider]    Family History Family History  Problem Relation Age of Onset  . Heart attack Mother   . Hypertension Mother   . Obesity Mother   . Depression Mother   . Anxiety disorder Mother   . Bipolar disorder Mother   . Allergic rhinitis Mother   . Cancer Father   . Allergic rhinitis Father   . Allergic rhinitis Brother   . Allergic rhinitis Maternal Grandmother   . Migraines Neg Hx   . Asthma Neg Hx   . Eczema Neg Hx   . Urticaria Neg Hx     Social History Social  History   Tobacco Use  . Smoking status: Former Smoker    Packs/day: 1.00    Types: Cigarettes    Quit date: 10/05/2018    Years since quitting: 1.3  . Smokeless tobacco: Never Used  Vaping Use  . Vaping Use: Never used  Substance Use Topics  . Alcohol use: Yes    Alcohol/week: 0.0 standard drinks    Comment: socially  . Drug use: Yes    Types: Marijuana    Comment: early in pregnancy for nausea     Allergies   Cats claw (uncaria tomentosa), Dust mite extract, Grass pollen(k-o-r-t-swt vern), and Mixed feathers   Review of Systems As per HPI   Physical Exam Triage Vital Signs ED Triage Vitals  Enc Vitals Group     BP 01/28/20 1329 (!) 170/108     Pulse Rate 01/28/20 1329 100     Resp 01/28/20 1329 18     Temp 01/28/20 1329 98.3 F (36.8 C)     Temp Source 01/28/20 1329 Oral     SpO2 01/28/20 1329 97 %     Weight --      Height --      Head Circumference --      Peak Flow --      Pain Score 01/28/20 1330 8     Pain Loc --      Pain Edu? --      Excl. in GC? --    No data found.  Updated Vital Signs BP (!) 170/108 (BP Location: Left Arm)   Pulse 100   Temp 98.3 F (36.8 C) (Oral)   Resp 18   SpO2 97%   Visual Acuity Right Eye Distance:   Left Eye Distance:   Bilateral Distance:    Right Eye Near:   Left Eye Near:    Bilateral Near:     Physical Exam Constitutional:      General: She  is not in acute distress. HENT:     Head: Normocephalic and atraumatic.     Right Ear: Tympanic membrane, ear canal and external ear normal.     Left Ear: Tympanic membrane, ear canal and external ear normal.     Mouth/Throat:     Mouth: Mucous membranes are moist.     Pharynx: Oropharynx is clear.  Eyes:     General: No scleral icterus.    Extraocular Movements: Extraocular movements intact.     Conjunctiva/sclera: Conjunctivae normal.     Pupils: Pupils are equal, round, and reactive to light.  Cardiovascular:     Rate and Rhythm: Normal rate.  Pulmonary:     Effort: Pulmonary effort is normal. No respiratory distress.     Breath sounds: No wheezing.  Musculoskeletal:        General: No deformity. Normal range of motion.     Cervical back: Normal range of motion. No rigidity or tenderness.  Lymphadenopathy:     Cervical: No cervical adenopathy.  Skin:    Capillary Refill: Capillary refill takes less than 2 seconds.     Coloration: Skin is not jaundiced.     Findings: No bruising or rash.  Neurological:     Mental Status: She is alert.     Cranial Nerves: Cranial nerves are intact.     Sensory: Sensation is intact.     Motor: Motor function is intact.     Coordination: Coordination is intact.     Gait: Gait is intact.  Psychiatric:  Mood and Affect: Mood normal.        Behavior: Behavior normal.      UC Treatments / Results  Labs (all labs ordered are listed, but only abnormal results are displayed) Labs Reviewed - No data to display  EKG   Radiology No results found.  Procedures Procedures (including critical care time)  Medications Ordered in UC Medications  ketorolac (TORADOL) 30 MG/ML injection 30 mg (30 mg Intramuscular Given 01/28/20 1500)  dexamethasone (DECADRON) injection 10 mg (10 mg Intramuscular Given 01/28/20 1500)  ondansetron (ZOFRAN-ODT) disintegrating tablet 4 mg (4 mg Oral Given 01/28/20 1501)    Initial Impression / Assessment and  Plan / UC Course  I have reviewed the triage vital signs and the nursing notes.  Pertinent labs & imaging results that were available during my care of the patient were reviewed by me and considered in my medical decision making (see chart for details).     Patient febrile, nontoxic, without neurocognitive deficit.  No signs/symptoms of active bleeding and patient is approximately 24 hours s/p LP.  Discussed bleed risk of NSAID, prednisone.  Headache cocktail given in office as listed above which she tolerated well.  Will continue care with neurology.  Return precautions discussed, pt verbalized understanding and is agreeable to plan. Final Clinical Impressions(s) / UC Diagnoses   Final diagnoses:  Bad headache     Discharge Instructions     You were given medications today for your headache. Important to keep a log of your headaches: When they start, what alleviates them, pain on a scale of 1-10. Bring your headache log to your primary care for further evaluation as you may need to be on medications to help prevent headaches. Go to ER for worse headache of life, loss/change of vision, vomiting, fever, ear ringing, dizziness, weakness, facial droop/slurred speech, severe abdominal pain.    ED Prescriptions    None     PDMP not reviewed this encounter.   Hall[place]Potvin, [place], [place] 01/28/20 1658

## 2020-01-28 NOTE — ED Triage Notes (Signed)
Pt here for HA after having spinal tap yesterday for intercranial hypertension

## 2020-01-31 ENCOUNTER — Emergency Department (HOSPITAL_COMMUNITY)
Admission: EM | Admit: 2020-01-31 | Discharge: 2020-01-31 | Disposition: A | Discharge status: Home / Self Care | Payer: Medicaid Other | Attending: Emergency Medicine | Admitting: Emergency Medicine

## 2020-01-31 ENCOUNTER — Other Ambulatory Visit: Payer: Self-pay

## 2020-01-31 DIAGNOSIS — Z7951 Long term (current) use of inhaled steroids: Secondary | ICD-10-CM | POA: Diagnosis not present

## 2020-01-31 DIAGNOSIS — Z79899 Other long term (current) drug therapy: Secondary | ICD-10-CM | POA: Insufficient documentation

## 2020-01-31 DIAGNOSIS — J45909 Unspecified asthma, uncomplicated: Secondary | ICD-10-CM | POA: Diagnosis not present

## 2020-01-31 DIAGNOSIS — R030 Elevated blood-pressure reading, without diagnosis of hypertension: Secondary | ICD-10-CM

## 2020-01-31 DIAGNOSIS — G43909 Migraine, unspecified, not intractable, without status migrainosus: Secondary | ICD-10-CM | POA: Diagnosis present

## 2020-01-31 DIAGNOSIS — Z8669 Personal history of other diseases of the nervous system and sense organs: Secondary | ICD-10-CM | POA: Insufficient documentation

## 2020-01-31 DIAGNOSIS — I10 Essential (primary) hypertension: Secondary | ICD-10-CM | POA: Insufficient documentation

## 2020-01-31 DIAGNOSIS — G971 Other reaction to spinal and lumbar puncture: Secondary | ICD-10-CM | POA: Diagnosis not present

## 2020-01-31 DIAGNOSIS — Z87891 Personal history of nicotine dependence: Secondary | ICD-10-CM | POA: Insufficient documentation

## 2020-01-31 DIAGNOSIS — E119 Type 2 diabetes mellitus without complications: Secondary | ICD-10-CM | POA: Insufficient documentation

## 2020-01-31 MED ORDER — METOCLOPRAMIDE HCL 5 MG/ML IJ SOLN
10.0000 mg | Freq: Once | INTRAMUSCULAR | Status: AC
  Administered 2020-01-31: 10 mg via INTRAVENOUS
  Filled 2020-01-31: qty 2

## 2020-01-31 MED ORDER — SODIUM CHLORIDE 0.9 % IV BOLUS
500.0000 mL | Freq: Once | INTRAVENOUS | Status: AC
  Administered 2020-01-31: 500 mL via INTRAVENOUS

## 2020-01-31 MED ORDER — DIPHENHYDRAMINE HCL 50 MG/ML IJ SOLN
25.0000 mg | Freq: Once | INTRAMUSCULAR | Status: AC
  Administered 2020-01-31: 25 mg via INTRAVENOUS
  Filled 2020-01-31: qty 1

## 2020-01-31 NOTE — ED Provider Notes (Signed)
[facility]-EMERGENCY DEPT Provider Note   [name]: [id number] Arrival date & time: 01/31/20  1344     History Chief Complaint  Patient presents with  . Migraine     Cassandra Tucker is a 29 y.o. female history of morbid obesity, diabetes, GERD, hypertension, hyperlipidemia, IIH, asthma.  Patient presents today for headache.  She reports that on Sunday, 01/25/2020 she developed a mild headache which is consistent with her normal headache caused by idiopathic intracranial hypertension.  She reports that she went to her neurologist on Tuesday, 01/27/2020 and had a lumbar puncture which improved her headache.  She reports that this caused a post LP headache which is a throbbing sensation on the back of her head moderate intensity constant worse with sitting up improved with lying flat.  She reports this headache normally resolves after 1-2 days.  The next day on 01/28/2020 she felt her headache had slightly worsened and she went to an urgent care and received Toradol and Decadron, this somewhat improved her headache and she went home.  She reports her headache has been mild but constant since and is never completely resolved.  She is requesting migraine cocktail today as this is normally the only thing that helps with her headaches.  Patient denies any abnormal features to her headache.  She denies any fevers/chills, vision changes, nausea/vomiting, neck stiffness, back pain, numbness/weakness, tingling, speech difficulty, balance issues or any additional concerns.  Of note patient denies chance of pregnancy today, she reports that she has an IUD.  Patient states understanding that medications given or prescribed today may result in harm to of a pregnancy and she accepts these risks and still chooses not to be pregnancy tested and proceed with medications. Additionally she reports she does not breast fee.   HPI     Past Medical History:  Diagnosis Date  . Ankle fracture, left  2000  . Anxiety   . Arthritis    left wrist and left ankle  . Asthma   . Back pain   . Bronchitis   . Diabetes (HCC) 01/11/2016   type II  history of  . Family history of adverse reaction to anesthesia    gmother had problems with N/V   . GERD (gastroesophageal reflux disease)   . Headache   . History of kidney stones   . HLD (hyperlipidemia)   . HSV-2 infection   . Hypertension 2013  . Intracranial hypertension    psuedotumor cebrum  . Joint pain   . Kienbock's disease   . Leg edema   . OSA (obstructive sleep apnea)    cpap - does not know settings   . Papilledema   . Pregnancy induced hypertension   . RLS (restless legs syndrome)   . Sinus tachycardia     Patient Active Problem List   Diagnosis Date Noted  . Seasonal and perennial allergic rhinoconjunctivitis 01/01/2020  . Postpartum anemia 07/20/2019  . Postpartum care following cesarean delivery 2/4 07/18/2019  . Cesarean delivery -  IOL with fetal intolerance of labor and FTP 07/18/2019  . Marginal insertion of umbilical cord affecting management of mother 07/16/2019  . Chronic hypertension affecting pregnancy 07/01/2019  . Pregnancy 06/29/2019  . BMI 45.0-49.9, adult (HCC) 06/29/2019  . Pregnancy-induced hypertension 06/26/2019  . Anemia 05/21/2019  . Asthma, not well controlled, unspecified asthma severity, uncomplicated 05/18/2019  . Maternal obesity affecting pregnancy, antepartum 01/28/2019  . History of depression 01/28/2019  . Anxiety 01/26/2019  . History of bariatric  surgery 01/13/2019  . Genital herpes simplex 01/13/2019  . Pregnant 12/25/2018  . Vitamin D deficiency 02/26/2017  . Class 3 obesity with serious comorbidity and body mass index (BMI) of 60.0 to 69.9 in adult 02/26/2017  . Controlled type 2 diabetes mellitus without complication, without long-term current use of insulin (HCC) 04/21/2016  . Gastroesophageal reflux disease without esophagitis 04/21/2016  . Pseudotumor cerebri syndrome  11/29/2015  . Super obesity 11/29/2015  . OSA on CPAP 11/29/2015  . RLS (restless legs syndrome) 11/29/2015  . Papilledema 06/16/2015  . Pain in the wrist 04/28/2014  . 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 HLD (hyperlipidemia) 01/21/2014  . Sinus tachycardia 12/04/2011  . Essential hypertension 12/04/2011  . Hypertension 12/04/2011  . Obesity, Class III, BMI 40-49.9 (morbid obesity) (HCC) 11/07/2011    Past Surgical History:  Procedure Laterality Date  . CESAREAN SECTION N/A 07/17/2019   Procedure: CESAREAN SECTION;  Surgeon: Osborn Coho, MD;  Location: [facility] LD ORS;  Service: Obstetrics;  Laterality: N/A;  . FRACTURE SURGERY  2005   ankle  . FRACTURE SURGERY  2013   wrist (deteriorating bone)  . LAPAROSCOPIC GASTRIC SLEEVE RESECTION N/A 05/08/2017   Procedure: LAPAROSCOPIC GASTRIC SLEEVE RESECTION;  Surgeon: Berna Bue, MD;  Location: [facility] ORS;  Service: General;  Laterality: N/A;  . LAPAROSCOPIC GASTRIC SLEEVE RESECTION  05/08/2017  . UPPER GI ENDOSCOPY  05/08/2017   Procedure: UPPER GI ENDOSCOPY;  Surgeon: Berna Bue, MD;  Location: [facility] ORS;  Service: General;;  . urethra stretch    . WRIST SURGERY  2015     OB History    Gravida  1   Para  1   Term  1   Preterm  0   AB  0   Living  1     SAB  0   TAB  0   Ectopic  0   Multiple  0   Live Births  1           Family History  Problem Relation Age of Onset  . Heart attack Mother   . Hypertension Mother   . Obesity Mother   . Depression Mother   . Anxiety disorder Mother   . Bipolar disorder Mother   . Allergic rhinitis Mother   . Cancer Father   . Allergic rhinitis Father   . Allergic rhinitis Brother   . Allergic rhinitis Maternal Grandmother   . Migraines Neg Hx   . Asthma Neg Hx   . Eczema Neg Hx   . Urticaria Neg Hx     Social History   Tobacco Use  . Smoking status: Former Smoker    Packs/day: 1.00    Types: Cigarettes    Quit date: 10/05/2018    Years since quitting:  1.3  . Smokeless tobacco: Never Used  Vaping Use  . Vaping Use: Never used  Substance Use Topics  . Alcohol use: Yes    Alcohol/week: 0.0 standard drinks    Comment: socially  . Drug use: Yes    Types: Marijuana    Comment: early in pregnancy for nausea    Home Medications Prior to Admission medications   Medication Sig Start Date End Date Taking? Authorizing Provider  albuterol (PROAIR HFA) 108 (90 Base) MCG/ACT inhaler Inhale 2 puffs into the lungs every 4 (four) hours as needed for wheezing or shortness of breath. 01/08/20   Ellamae Sia, DO  azelastine (ASTELIN) 0.1 % nasal spray Place 1-2 sprays into both nostrils  2 (two) times daily as needed (runny nose, drainage). Use in each nostril as directed 11/06/19   Ellamae SiaKim, Yoon M, DO  budesonide-formoterol [facility](SYMBICORT) 160-4.5 MCG/ACT inhaler Inhale 2 puffs into the lungs in the morning and at bedtime. with spacer and rinse mouth afterwards. 01/08/20   Ellamae SiaKim, Yoon M, DO  fexofenadine (ALLEGRA) 180 MG tablet Take 1 tablet (180 mg total) by mouth daily. 12/11/19   Ellamae SiaKim, Yoon M, DO  fluticasone (FLONASE) 50 MCG/ACT nasal spray Place 2 sprays into both nostrils daily. 10/03/19   Daphine DeutscherMartin, Mary-Margaret, FNP  ipratropium-albuterol (DUONEB) 0.5-2.5 (3) MG/3ML SOLN Take 3 mLs by nebulization every 6 (six) hours as needed. 12/15/19   Belinda FisherYu, Amy V, PA-C  levocetirizine (XYZAL) 5 MG tablet Take 1 tablet (5 mg total) by mouth every evening. 01/08/20   Ellamae SiaKim, Yoon M, DO  levonorgestrel (MIRENA, 52 MG,) 20 MCG/24HR IUD Mirena 20 mcg/24 hours (6 yrs) 52 mg intrauterine device  Take 1 device by intrauterine route.    [provider]  NIFEdipine (ADALAT CC) 60 MG 24 hr tablet Take 60 mg by mouth every morning.    [provider]  NIFEdipine (PROCARDIA XL/NIFEDICAL-XL) 90 MG 24 hr tablet Take 1 tablet (90 mg total) by mouth daily. 07/20/19   Dale DurhamMontana, Jade, FNP  terconazole (TERAZOL 7) 0.4 % vaginal cream terconazole 0.4 % vaginal cream   1 applicatorful every day  by vaginal route.    [provider]  cetirizine (ZYRTEC) 10 MG tablet cetirizine 10 mg tablet  TAKE 1 TABLET BY MOUTH EVERY DAY  12/15/19  [provider]    Allergies    Cats claw (uncaria tomentosa), Dust mite extract, Grass pollen(k-o-r-t-swt vern), and Mixed feathers  Review of Systems   Review of Systems Ten systems are reviewed and are negative for acute change except as noted in the HPI  Physical Exam Updated Vital Signs BP (!) 197/110 (BP Location: Right Arm)   Pulse (!) 120   Temp 98.1 F (36.7 C) (Oral)   Resp 18   Ht 5\' 7"  (1.702 m)   Wt (!) 140.6 kg   SpO2 99%   BMI 48.55 kg/m   Physical Exam Constitutional:      General: She is not in acute distress.    Appearance: Normal appearance. She is well-developed. She is not ill-appearing or diaphoretic.  HENT:     Head: Normocephalic and atraumatic.     Right Ear: External ear normal.     Left Ear: External ear normal.  Eyes:     General: Vision grossly intact. Gaze aligned appropriately.     Pupils: Pupils are equal, round, and reactive to light.  Neck:     Trachea: Trachea and phonation normal. No tracheal tenderness or tracheal deviation.     Meningeal: Brudzinski's sign absent.  Pulmonary:     Effort: Pulmonary effort is normal. No respiratory distress.  Abdominal:     General: There is no distension.     Palpations: Abdomen is soft.     Tenderness: There is no abdominal tenderness. There is no guarding or rebound.  Musculoskeletal:        General: Normal range of motion.     Cervical back: Normal range of motion.     Comments: No midline C/T/L spinal tenderness to palpation, no deformity, crepitus, or step-off noted. No sign of injury to the neck or back.  No bleeding or evidence of infection at lumbar puncture site.  Skin:    General: Skin is  warm and dry.  Neurological:     Mental Status: She is alert.     GCS: GCS eye subscore is 4. GCS verbal subscore is 5. GCS motor subscore is 6.       Comments: Speech is clear and goal oriented, follows commands Major Cranial nerves without deficit, no facial droop Normal strength in upper and lower extremities bilaterally including dorsiflexion and plantar flexion, strong and equal grip strength Sensation normal to light and sharp touch Moves extremities without ataxia, coordination intact Normal finger to nose and rapid alternating movements Neg romberg, no pronator drift Normal gait  Psychiatric:        Behavior: Behavior normal.     ED Results / Procedures / Treatments   Labs (all labs ordered are listed, but only abnormal results are displayed) Labs Reviewed - No data to display  EKG None  Radiology No results found.  Procedures Procedures (including critical care time)  Medications Ordered in ED Medications  metoCLOPramide (REGLAN) injection 10 mg (10 mg Intravenous Given 01/31/20 1557)  diphenhydrAMINE (BENADRYL) injection 25 mg (25 mg Intravenous Given 01/31/20 1558)  sodium chloride 0.9 % bolus 500 mL (500 mLs Intravenous New Bag/Given 01/31/20 1556)    ED Course  I have reviewed the triage vital signs and the nursing notes.  Pertinent labs & imaging results that were available during my care of the patient were reviewed by me and considered in my medical decision making (see chart for details).    MDM Rules/Calculators/A&P                          Additional history obtained from: 1. Nursing notes from this visit. 2. Review of medical records.  ED visit on 01/28/2020 diagnosis bad headache. -------------------------- 29 year old female history as above presented for headache status post lumbar puncture 4 days ago.  She describes headache worsened with sitting up she denies any abnormal features today.  Triage vital signs show hypertension and tachycardia, suspect may be secondary to headache.  She is requesting a migraine cocktail.  Will treat with Reglan and Benadryl and IV fluids and reassess.  She  received Decadron and Toradol 3 days ago.  She has no meningeal signs and a normal neurologic exam.  No evidence of infection at the site of lumbar puncture. -------------- 4:15 PM: Patient reassessed resting comfortably no acute distress reports headache improving following Benadryl/Reglan.  Vital signs improved.  Blood pressure 168/102, heart rate 90 bpm.  Respiratory rate regular and unlabored.  Pulse ox 100% on room air.   Care handoff given to Dr. Denton Lank at shift change. Disposition per oncoming team.  Note: Portions of this report may have been transcribed using voice recognition software. Every effort was made to ensure accuracy; however, inadvertent computerized transcription errors may still be present. Final Clinical Impression(s) / ED Diagnoses Final diagnoses:  None    Rx / DC Orders ED Discharge Orders    None       Elizabeth Palau 01/31/20 1624    Cathren Laine, MD 01/31/20 1655

## 2020-01-31 NOTE — ED Triage Notes (Signed)
Patient has migraine since Wednesday. Says she had spinal tap on Tuesday for ich. Pain rated 8/10

## 2020-01-31 NOTE — Discharge Instructions (Signed)
It was our pleasure to provide your ER care today - we hope that you feel better.  Rest. Drink plenty of fluids.   Take excedrin as need for headache. Continue diamox.   Your blood pressure is high today - continue blood pressure medication, limit salt intake, and follow up with primary care doctor.   If headache persists/fails to resolve by Monday, contact your doctor and/or site where LP was done to discuss additional therapies.   Return to ER if worse, new symptoms, worsening or severe pain, fevers, persistent vomiting, or other concern.

## 2020-02-04 ENCOUNTER — Ambulatory Visit (INDEPENDENT_AMBULATORY_CARE_PROVIDER_SITE_OTHER): Payer: Medicaid Other | Admitting: Bariatrics

## 2020-02-04 NOTE — Progress Notes (Signed)
RE: Cassandra Tucker MRN: [id number] DOB: May 02, 1991 Date of Telemedicine Visit: 02/05/2020  Referring provider: [name], * Primary care provider: [name], FNP  Chief Complaint: Asthma (Slightly better than before last attach was yesterday 02/04/2020)   Telemedicine Follow Up Visit via Telephone: I connected with Cassandra Tucker for a follow up on 02/05/20 by telephone and verified that I am speaking with the correct person using two identifiers.   I discussed the limitations, risks, security and privacy concerns of performing an evaluation and management service by telephone and the availability of in person appointments. I also discussed with the patient that there may be a patient responsible charge related to this service. The patient expressed understanding and agreed to proceed.  Patient is at home/work. Provider is at the office.  Visit start time: 4:07PM Visit end time: 4:28PM Insurance consent/check in by: front desk Medical consent and medical assistant/nurse: Lachelle S.  History of Present Illness: She is a 29 y.o. female, who is being followed for asthma, allergic rhinoconjunctivitis. Her previous allergy office visit was on 01/08/2020 with Dr. Selena Batten via telemedicine. Today is a regular follow up visit.  Asthma:  Slightly better than the last time but still having issues with asthma flares 1-2 per day. She is having wheezing, shortness of breath. No coughing. No fevers/chills.  Currently on Symbicort 2 puffs twice a day with spacer.  Using albuterol nebulizer at night.  Using albuterol 5-6 times per day with good benefit.  Finished prednisone which helped. No previous biologics for asthma.  Seasonal and perennial allergic rhinoconjunctivitis Doing well with Flonase, allegra and azelastine nasal spray.  Assessment and Plan: Cassandra Tucker is a 29 y.o. female with: Asthma, not well controlled, unspecified asthma severity,  uncomplicated Past history - Symptoms of chest tightness, shortness of breath, coughing, wheezing, nocturnal awakenings for 1 year. Daily symptoms and using albuterol 1-2 times a day. No daily ICS inhalers. History of reflux but no longer on PPI. Ex-smoker. 2021 spirometry showed: normal pattern and 13% improvement in FEV1 post bronchodilator treatment. Clinically feeling better. Interim history - Negative Covid-19 testing. Doing slightly better since finished prednisone but still using albuterol throughout the day. . Daily controller medication(s): continue Symbicort 2 puffs twice a day with spacer and rinse mouth afterwards. . Add Spiriva 1.20mcg 2 puffs once a day. Start Singulair (montelukast) 10mg  daily at night. Cautioned that in some children/adults can experience behavioral changes including hyperactivity, agitation, depression, sleep disturbances and suicidal ideations. These side effects are rare, but if you notice them you should notify me and discontinue Singulair (montelukast). . May use albuterol rescue inhaler 2 puffs every 4 to 6 hours as needed for shortness of breath, chest tightness, coughing, and wheezing. May use albuterol rescue inhaler 2 puffs 5 to 15 minutes prior to strenuous physical activities. Monitor frequency of use.  . Get cbc diff and IgE level to see if meets criteria for biologics. . Follow up in 2 weeks in person - get spirometry.  . Declines additional prednisone at this time.   Seasonal and perennial allergic rhinoconjunctivitis Past history - Perennial rhino conjunctivitis symptoms for 20+ years with worsening in the spring. No previous ENT evaluation. Tried Claritin, Flonase and Zyrtec with some benefit. 2021 skin testing showed: Positive to grass, dust mites, cats, feathers. Negative to common foods.  Interim history - stable.   Continue environmental control measures as below.  May use over the counter antihistamines such as Zyrtec (cetirizine),  Claritin (loratadine), Allegra (fexofenadine),  or Xyzal (levocetirizine) daily. May take it twice a day if needed.   Use Flonase 1 spray per nostril twice a day for nasal congestion.  Use azelastine 1-2 spray per nostril twice a day for runny nose.   If above regimen does not control symptoms then will discuss starting allergy immunotherapy once asthma is more stable.   Return in about 2 weeks (around 02/19/2020).  Meds ordered this encounter  Medications  . Tiotropium Bromide Monohydrate (SPIRIVA RESPIMAT) 1.25 MCG/ACT AERS    Sig: Inhale 2 puffs into the lungs daily.    Dispense:  4 g    Refill:  5  . montelukast (SINGULAIR) 10 MG tablet    Sig: Take 1 tablet (10 mg total) by mouth at bedtime.    Dispense:  30 tablet    Refill:  5    Lab Orders     CBC with Differential/Platelet     IgE  Diagnostics: None.  Medication List:  Current Outpatient Medications  Medication Sig Dispense Refill  . albuterol (PROAIR HFA) 108 (90 Base) MCG/ACT inhaler Inhale 2 puffs into the lungs every 4 (four) hours as needed for wheezing or shortness of breath. 18 g 0  . azelastine (ASTELIN) 0.1 % nasal spray Place 1-2 sprays into both nostrils 2 (two) times daily as needed (runny nose, drainage). Use in each nostril as directed 30 mL 5  . budesonide-formoterol (SYMBICORT) 160-4.5 MCG/ACT inhaler Inhale 2 puffs into the lungs in the morning and at bedtime. with spacer and rinse mouth afterwards. 1 Inhaler 5  . fexofenadine (ALLEGRA) 180 MG tablet Take 1 tablet (180 mg total) by mouth daily. 30 tablet 5  . fluticasone (FLONASE) 50 MCG/ACT nasal spray Place 2 sprays into both nostrils daily. 16 g 6  . ipratropium-albuterol (DUONEB) 0.5-2.5 (3) MG/3ML SOLN Take 3 mLs by nebulization every 6 (six) hours as needed. (Patient taking differently: Take 3 mLs by nebulization every 6 (six) hours as needed (shortness of breath). ) 60 mL 0  . levonorgestrel (MIRENA, 52 MG,) 20 MCG/24HR IUD Mirena 20 mcg/24 hours (6  yrs) 52 mg intrauterine device  Take 1 device by intrauterine route.    . montelukast (SINGULAIR) 10 MG tablet Take 1 tablet (10 mg total) by mouth at bedtime. 30 tablet 5  . NIFEdipine (ADALAT CC) 60 MG 24 hr tablet Take 60 mg by mouth every morning.    Marland Kitchen NIFEdipine (PROCARDIA XL/NIFEDICAL-XL) 90 MG 24 hr tablet Take 1 tablet (90 mg total) by mouth daily. 30 tablet 3  . terconazole (TERAZOL 7) 0.4 % vaginal cream terconazole 0.4 % vaginal cream   1 applicatorful every day by vaginal route.    . Tiotropium Bromide Monohydrate (SPIRIVA RESPIMAT) 1.25 MCG/ACT AERS Inhale 2 puffs into the lungs daily. 4 g 5   No current facility-administered medications for this visit.   Allergies: Allergies  Allergen Reactions  . Cats Claw (Uncaria Tomentosa) Itching, Shortness Of Breath and Swelling  . Dust Mite Extract Itching and Shortness Of Breath  . Grass Pollen(K-O-R-T-Swt Vern) Itching and Shortness Of Breath  . Mixed Feathers Itching, Shortness Of Breath and Swelling   I reviewed her past medical history, social history, family history, and environmental history and no significant changes have been reported from her previous visit.  Review of Systems  Constitutional: Negative for appetite change, chills, fever and unexpected weight change.  HENT: Negative for congestion, postnasal drip, rhinorrhea and sneezing.   Eyes: Negative for itching.  Respiratory: Positive for chest tightness,  shortness of breath and wheezing. Negative for cough.   Cardiovascular: Negative for chest pain.  Gastrointestinal: Negative for abdominal pain.  Genitourinary: Negative for difficulty urinating.  Skin: Negative for rash.  Allergic/Immunologic: Positive for environmental allergies. Negative for food allergies.  Neurological: Negative for headaches.   Objective: Physical Exam Not obtained as encounter was done via telephone.   Previous notes and tests were reviewed.  I discussed the assessment and treatment  plan with the patient. The patient was provided an opportunity to ask questions and all were answered. The patient agreed with the plan and demonstrated an understanding of the instructions. After visit summary/patient instructions available via mychart.   The patient was advised to call back or seek an in-person evaluation if the symptoms worsen or if the condition fails to improve as anticipated.  I provided 21 minutes of non-face-to-face time during this encounter.  It was my pleasure to participate in [facility] Cassandra Tucker's care today. Please feel free to contact me with any questions or concerns.   Sincerely,  Wyline Mood, DO Allergy & Immunology  [facility] office: [phone] [facility] office: [phone] [place] office: [phone]

## 2020-02-05 ENCOUNTER — Encounter: Payer: Self-pay | Admitting: Allergy

## 2020-02-05 ENCOUNTER — Ambulatory Visit (INDEPENDENT_AMBULATORY_CARE_PROVIDER_SITE_OTHER): Payer: Medicaid Other | Admitting: Allergy

## 2020-02-05 ENCOUNTER — Other Ambulatory Visit: Payer: Self-pay

## 2020-02-05 VITALS — Wt 309.4 lb

## 2020-02-05 DIAGNOSIS — J302 Other seasonal allergic rhinitis: Secondary | ICD-10-CM | POA: Diagnosis not present

## 2020-02-05 DIAGNOSIS — J45909 Unspecified asthma, uncomplicated: Secondary | ICD-10-CM

## 2020-02-05 DIAGNOSIS — J3089 Other allergic rhinitis: Secondary | ICD-10-CM | POA: Diagnosis not present

## 2020-02-05 DIAGNOSIS — H1010 Acute atopic conjunctivitis, unspecified eye: Secondary | ICD-10-CM | POA: Diagnosis not present

## 2020-02-05 MED ORDER — SPIRIVA RESPIMAT 1.25 MCG/ACT IN AERS: 2.0000 | INHALATION_SPRAY | Freq: Every day | RESPIRATORY_TRACT | 5 refills | Status: DC

## 2020-02-05 MED ORDER — MONTELUKAST SODIUM 10 MG PO TABS: 10.0000 mg | ORAL_TABLET | Freq: Every day | ORAL | 5 refills | Status: DC

## 2020-02-05 NOTE — Assessment & Plan Note (Signed)
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 No previous ENT evaluation. Tried Claritin, Flonase and Zyrtec with some benefit. 2021 skin testing showed: Positive to grass, dust mites, cats, feathers. Negative to common foods.  Interim history - stable.   Continue environmental control measures as below.  May use over the counter antihistamines such as Zyrtec (cetirizine), Claritin (loratadine), Allegra (fexofenadine), or Xyzal (levocetirizine) daily. May take it twice a day if needed.   Use Flonase 1 spray per nostril twice a day for nasal congestion.  Use azelastine 1-2 spray per nostril twice a day for runny nose.   If above regimen does not control symptoms then will discuss starting allergy immunotherapy once asthma is more stable.

## 2020-02-05 NOTE — Assessment & Plan Note (Signed)
Past history - Symptoms of chest tightness, shortness of breath, coughing, wheezing, nocturnal awakenings for 1 year. Daily symptoms and using albuterol 1-2 times a day. No daily ICS inhalers. History of reflux but no longer on PPI. Ex-smoker. 2021 spirometry showed: normal pattern and 13% improvement in FEV1 post bronchodilator treatment. Clinically feeling better. Interim history - Negative Covid-19 testing. Doing slightly better since finished prednisone but still using albuterol throughout the day. . Daily controller medication(s): continue Symbicort 2 puffs twice a day with spacer and rinse mouth afterwards. . Add Spiriva 1.57mcg 2 puffs once a day. Start Singulair (montelukast) 10mg  daily at night. Cautioned that in some children/adults can experience behavioral changes including hyperactivity, agitation, depression, sleep disturbances and suicidal ideations. These side effects are rare, but if you notice them you should notify me and discontinue Singulair (montelukast). . May use albuterol rescue inhaler 2 puffs every 4 to 6 hours as needed for shortness of breath, chest tightness, coughing, and wheezing. May use albuterol rescue inhaler 2 puffs 5 to 15 minutes prior to strenuous physical activities. Monitor frequency of use.  . Get cbc diff and IgE level to see if meets criteria for biologics. . Follow up in 2 weeks in person - get spirometry.  . Declines additional prednisone at this time.

## 2020-02-05 NOTE — Patient Instructions (Addendum)
Environmental allergies  Past skin testing showed: Positive to grass, dust mites, cats, feathers.  Continue environmental control measures as below.  May use over the counter antihistamines such as Zyrtec (cetirizine), Claritin (loratadine), Allegra (fexofenadine), or Xyzal (levocetirizine) daily. May take it twice a day if needed.   Use Flonase 1 spray per nostril twice a day for nasal congestion.  Use azelastine 1-2 spray per nostril twice a day for runny nose.   Asthma: . Daily controller medication(s): continue Symbicort 2 puffs twice a day with spacer and rinse mouth afterwards. . Add Spiriva 1.64mcg 2 puffs once a day. Start Singulair (montelukast) 10mg  daily at night. Cautioned that in some children/adults can experience behavioral changes including hyperactivity, agitation, depression, sleep disturbances and suicidal ideations. These side effects are rare, but if you notice them you should notify me and discontinue Singulair (montelukast). . May use albuterol rescue inhaler 2 puffs every 4 to 6 hours as needed for shortness of breath, chest tightness, coughing, and wheezing. May use albuterol rescue inhaler 2 puffs 5 to 15 minutes prior to strenuous physical activities. Monitor frequency of use.  . Get cbc diff and IgE level to see if meets criteria for biologics. . Asthma control goals:  o Full participation in all desired activities (may need albuterol before activity) o Albuterol use two times or less a week on average (not counting use with activity) o Cough interfering with sleep two times or less a month o Oral steroids no more than once a year o No hospitalizations  If your breathing does not improve after 2 back to back albuterol nebulizer treatment please go to the ER/urgent care.  If you develop fevers, discolored mucous let know.  Follow up in 2 weeks in person.   Reducing Pollen Exposure . Pollen seasons: trees (spring), grass (summer) and ragweed/weeds  (fall). [birth year]1980 Keep windows closed in your home and car to lower pollen exposure.  Marland Kitchen air conditioning in the bedroom and throughout the house if possible.  . Avoid going out in dry windy days - especially early morning. . Pollen counts are highest between 5 - 10 AM and on dry, hot and windy days.  . Save outside activities for late afternoon or after a heavy rain, when pollen levels are lower.  . Avoid mowing of grass if you have grass pollen allergy. Cassandra Tucker Be aware that pollen can also be transported indoors on people and pets.  . Dry your clothes in an automatic dryer rather than hanging them outside where they might collect pollen.  . Rinse hair and eyes before bedtime. Control of House Dust Mite Allergen . Dust mite allergens are a common trigger of allergy and asthma symptoms. While they can be found throughout the house, these microscopic creatures thrive in warm, humid environments such as bedding, upholstered furniture and carpeting. . Because so much time is spent in the bedroom, it is essential to reduce mite levels there.  . Encase pillows, mattresses, and box springs in special allergen-proof fabric covers or airtight, zippered plastic covers.  . Bedding should be washed weekly in hot water (130 F) and dried in a hot dryer. Allergen-proof covers are available for comforters and pillows that can't be regularly washed.  Marland Kitchen the allergy-proof covers every few months. Minimize clutter in the bedroom. Keep pets out of the bedroom.  Cassandra Tucker Keep humidity less than 50% by using a dehumidifier or air conditioning. You can buy a humidity measuring device called a hygrometer to  monitor this.  . If possible, replace carpets with hardwood, linoleum, or washable area rugs. If that's not possible, vacuum frequently with a vacuum that has a HEPA filter. . Remove all upholstered furniture and non-washable window drapes from the bedroom. . Remove all non-washable stuffed toys from the bedroom.  Wash  stuffed toys weekly. Pet Allergen Avoidance: . Contrary to popular opinion, there are no "hypoallergenic" breeds of dogs or cats. That is because people are not allergic to an animal's hair, but to an allergen found in the animal's saliva, dander (dead skin flakes) or urine. Pet allergy symptoms typically occur within minutes. For some people, symptoms can build up and become most severe 8 to 12 hours after contact with the animal. People with severe allergies can experience reactions in public places if dander has been transported on the pet owners' clothing. Marland Kitchen Keeping an animal outdoors is only a partial solution, since homes with pets in the yard still have higher concentrations of animal allergens. . Before getting a pet, ask your allergist to determine if you are allergic to animals. If your pet is already considered part of your family, try to minimize contact and keep the pet out of the bedroom and other rooms where you spend a great deal of time. . As with dust mites, vacuum carpets often or replace carpet with a hardwood floor, tile or linoleum. . High-efficiency particulate air (HEPA) cleaners can reduce allergen levels over time. . While dander and saliva are the source of cat and dog allergens, urine is the source of allergens from rabbits, hamsters, mice and [place] pigs; so ask a non-allergic family member to clean the animal's cage. . If you have a pet allergy, talk to your allergist about the potential for allergy immunotherapy (allergy shots). This strategy can often provide long-term relief.

## 2020-02-11 DIAGNOSIS — R519 Headache, unspecified: Secondary | ICD-10-CM | POA: Diagnosis not present

## 2020-02-18 ENCOUNTER — Ambulatory Visit (INDEPENDENT_AMBULATORY_CARE_PROVIDER_SITE_OTHER): Payer: Medicaid Other | Admitting: Bariatrics

## 2020-02-18 ENCOUNTER — Encounter (INDEPENDENT_AMBULATORY_CARE_PROVIDER_SITE_OTHER): Payer: Self-pay | Admitting: Bariatrics

## 2020-02-18 ENCOUNTER — Other Ambulatory Visit: Payer: Self-pay

## 2020-02-18 VITALS — BP 146/88 | HR 89 | Temp 98.2°F | Ht 65.0 in | Wt 316.0 lb

## 2020-02-18 DIAGNOSIS — I10 Essential (primary) hypertension: Secondary | ICD-10-CM

## 2020-02-18 DIAGNOSIS — R0602 Shortness of breath: Secondary | ICD-10-CM

## 2020-02-18 DIAGNOSIS — M9904 Segmental and somatic dysfunction of sacral region: Secondary | ICD-10-CM | POA: Diagnosis not present

## 2020-02-18 DIAGNOSIS — E559 Vitamin D deficiency, unspecified: Secondary | ICD-10-CM

## 2020-02-18 DIAGNOSIS — G4733 Obstructive sleep apnea (adult) (pediatric): Secondary | ICD-10-CM | POA: Diagnosis not present

## 2020-02-18 DIAGNOSIS — Z9989 Dependence on other enabling machines and devices: Secondary | ICD-10-CM

## 2020-02-18 DIAGNOSIS — Z9884 Bariatric surgery status: Secondary | ICD-10-CM

## 2020-02-18 DIAGNOSIS — K219 Gastro-esophageal reflux disease without esophagitis: Secondary | ICD-10-CM

## 2020-02-18 DIAGNOSIS — M9905 Segmental and somatic dysfunction of pelvic region: Secondary | ICD-10-CM | POA: Diagnosis not present

## 2020-02-18 DIAGNOSIS — G932 Benign intracranial hypertension: Secondary | ICD-10-CM

## 2020-02-18 DIAGNOSIS — R5383 Other fatigue: Secondary | ICD-10-CM

## 2020-02-18 DIAGNOSIS — Z1331 Encounter for screening for depression: Secondary | ICD-10-CM | POA: Diagnosis not present

## 2020-02-18 DIAGNOSIS — Z6841 Body Mass Index (BMI) 40.0 and over, adult: Secondary | ICD-10-CM

## 2020-02-18 DIAGNOSIS — E66813 Obesity, class 3: Secondary | ICD-10-CM

## 2020-02-18 DIAGNOSIS — D508 Other iron deficiency anemias: Secondary | ICD-10-CM | POA: Diagnosis not present

## 2020-02-18 DIAGNOSIS — E669 Obesity, unspecified: Secondary | ICD-10-CM | POA: Diagnosis not present

## 2020-02-18 DIAGNOSIS — E7849 Other hyperlipidemia: Secondary | ICD-10-CM | POA: Diagnosis not present

## 2020-02-18 DIAGNOSIS — E1169 Type 2 diabetes mellitus with other specified complication: Secondary | ICD-10-CM

## 2020-02-18 DIAGNOSIS — Z0289 Encounter for other administrative examinations: Secondary | ICD-10-CM

## 2020-02-18 DIAGNOSIS — M545 Low back pain: Secondary | ICD-10-CM | POA: Diagnosis not present

## 2020-02-18 DIAGNOSIS — M9903 Segmental and somatic dysfunction of lumbar region: Secondary | ICD-10-CM | POA: Diagnosis not present

## 2020-02-18 NOTE — Progress Notes (Signed)
Follow Up Note  RE: Cassandra Tucker MRN: [id number] DOB: 07-26-1990 Date of Office Visit: 02/19/2020  Referring provider: [name], * Primary care provider: [name], FNP  Chief Complaint: Follow-up and Asthma  History of Present Illness: I had the pleasure of seeing Cassandra Tucker for a follow up visit at the [facility] on 02/19/2020. She is a 29 y.o. female, who is being followed for asthma, allergic rhinoconjunctivitis. Her previous allergy office visit was on 02/05/2020 with Dr. Selena Batten. Today is a regular follow up visit. Up to date with COVID-19 vaccine: no  Asthma: Currently on Symbicort 2 puffs BID, Spiriva 2 puffs QHS and Singulair 10mg  QHS and noted some improvement. Using albuterol 3-4 times a day and mostly for exertion related issues with good benefit. Not having breathing flares when at rest.  Only used albuterol once today in the morning.   No additional ER/urgent care visits or prednisone use.   Asthma worsened since she gained back weight. 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   Got bloodwork drawn yesterday and interested in biologics injectable for asthma.   Patient is also going to weight loss clinic.  Seasonal and perennial allergic rhinoconjunctivitis Currently on Allegra daily, Flonase and azelastine nasal spray with good benefit.  No nosebleeds.   Assessment and Plan: Cassandra Tucker is a 29 y.o. female with: Not well controlled severe persistent asthma Past history - Symptoms of chest tightness, shortness of breath, coughing, wheezing, nocturnal awakenings for 1 year. Daily symptoms and using albuterol 1-2 times a day. No daily ICS inhalers. History of reflux but no longer on PPI. Ex-smoker. 2021 spirometry showed: normal pattern and 13% improvement in FEV1 post bronchodilator treatment. Clinically feeling better. Interim history - doing better and using albuterol less  but still using daily mainly with exertion. Asthma worsened since gained significant weight.  . Today's spirometry was unremarkable. . Discussed biologics injectable for asthma. Consent was signed. Eosinophils were 400 on 02/18/20. IgE level was not drawn for some reason. o Start Fasenra injections and will send note to our biologics coordinator to start paperwork - patient called after visit and made aware of plan.  . Daily controller medication(s): continue Symbicort 04/19/20 2 puffs twice a day with spacer and rinse mouth afterwards. . Continue Spiriva 1.93mcg 2 puffs once a day. Continue Singulair (montelukast) 10mg  daily at night. May use albuterol rescue inhaler 2 puffs every 4 to 6 hours as needed for shortness of breath, chest tightness, coughing, and wheezing. May use albuterol rescue inhaler 2 puffs 5 to 15 minutes prior to strenuous physical activities. Monitor frequency of use.  Discussed that some of her exertion related breathing issues most likely due to physical deconditioning. Discussed and encouraged exercise/weight loss.  Repeat spirometry at next visit.  Seasonal and perennial allergic rhinoconjunctivitis Past history - Perennial rhino conjunctivitis symptoms for 20+ years with worsening in the spring. No previous ENT evaluation. Tried Claritin, Flonase and Zyrtec with some benefit. 2021 skin testing showed: Positive to grass, dust mites, cats, feathers. Negative to common foods.  Interim history - stable with below regimen.  Continue environmental control measures as below.  May use over the counter antihistamines such as Zyrtec (cetirizine), Claritin (loratadine), Allegra (fexofenadine), or Xyzal (levocetirizine) daily. May take it twice a day if needed.   Use Flonase 1 spray per nostril twice a day for nasal congestion.  Use azelastine 1-2 spray per nostril twice  a day for runny nose.   If above regimen does not control symptoms then will discuss starting allergy  immunotherapy once asthma is more stable.   Vaccine counseling  Patient is hesitant about the vaccine and is not interested in getting it at this time.   Recommend getting the COVID-19 vaccine and gave website link to learn more about it.   Return in about 3 months (around 05/20/2020).  Meds ordered this encounter  Medications  . albuterol (PROVENTIL) (2.5 MG/3ML) 0.083% nebulizer solution    Sig: Take 3 mLs (2.5 mg total) by nebulization every 4 (four) hours as needed for wheezing or shortness of breath.    Dispense:  75 mL    Refill:  1   Diagnostics: Spirometry:  Tracings reviewed. Her effort: Good reproducible efforts. FVC: 3.12L FEV1: 2.63L, 79% predicted FEV1/FVC ratio: 84% Interpretation: Spirometry consistent with normal pattern.  Please see scanned spirometry results for details.  Medication List:  Current Outpatient Medications  Medication Sig Dispense Refill  . albuterol (PROAIR HFA) 108 (90 Base) MCG/ACT inhaler Inhale 2 puffs into the lungs every 4 (four) hours as needed for wheezing or shortness of breath. 18 g 0  . budesonide-formoterol (SYMBICORT) 160-4.5 MCG/ACT inhaler Inhale 2 puffs into the lungs in the morning and at bedtime. with spacer and rinse mouth afterwards. 1 Inhaler 5  . fluticasone (FLONASE) 50 MCG/ACT nasal spray Place 2 sprays into both nostrils daily. 16 g 6  . levonorgestrel (MIRENA, 52 MG,) 20 MCG/24HR IUD Mirena 20 mcg/24 hours (6 yrs) 52 mg intrauterine device  Take 1 device by intrauterine route.    . montelukast (SINGULAIR) 10 MG tablet Take 1 tablet (10 mg total) by mouth at bedtime. 30 tablet 5  . albuterol (PROVENTIL) (2.5 MG/3ML) 0.083% nebulizer solution Take 3 mLs (2.5 mg total) by nebulization every 4 (four) hours as needed for wheezing or shortness of breath. 75 mL 1  . NIFEdipine (ADALAT CC) 60 MG 24 hr tablet Take 60 mg by mouth every morning.    Marland Kitchen NIFEdipine (PROCARDIA XL/NIFEDICAL-XL) 90 MG 24 hr tablet Take 1 tablet (90 mg  total) by mouth daily. 30 tablet 3  . terconazole (TERAZOL 7) 0.4 % vaginal cream terconazole 0.4 % vaginal cream   1 applicatorful every day by vaginal route.     No current facility-administered medications for this visit.   Allergies: Allergies  Allergen Reactions  . Cats Claw (Uncaria Tomentosa) Itching, Shortness Of Breath and Swelling  . Dust Mite Extract Itching and Shortness Of Breath  . Grass Pollen(K-O-R-T-Swt Vern) Itching and Shortness Of Breath  . Mixed Feathers Itching, Shortness Of Breath and Swelling   I reviewed her past medical history, social history, family history, and environmental history and no significant changes have been reported from her previous visit.  Review of Systems  Constitutional: Negative for appetite change, chills, fever and unexpected weight change.  HENT: Negative for congestion, postnasal drip, rhinorrhea and sneezing.   Eyes: Negative for itching.  Respiratory: Positive for chest tightness, shortness of breath and wheezing. Negative for cough.   Cardiovascular: Negative for chest pain.  Gastrointestinal: Negative for abdominal pain.  Genitourinary: Negative for difficulty urinating.  Skin: Negative for rash.  Allergic/Immunologic: Positive for environmental allergies. Negative for food allergies.  Neurological: Negative for headaches.   Objective: BP (!) 150/80   Pulse 82   Temp (!) 96.5 F (35.8 C) (Temporal)   Resp 18   Wt (!) 321 lb 12.8 oz (146 kg)  SpO2 100%   BMI 53.55 kg/m  Body mass index is 53.55 kg/m. Physical Exam Vitals and nursing note reviewed.  Constitutional:      Appearance: Normal appearance. She is well-developed. She is obese.  HENT:     Head: Normocephalic and atraumatic.     Right Ear: Tympanic membrane and external ear normal.     Left Ear: Tympanic membrane and external ear normal.     Nose: Nose normal.     Mouth/Throat:     Mouth: Mucous membranes are moist.     Pharynx: Oropharynx is clear.    Eyes:     Conjunctiva/sclera: Conjunctivae normal.  Cardiovascular:     Rate and Rhythm: Normal rate and regular rhythm.     Heart sounds: Normal heart sounds. No murmur heard.  No friction rub. No gallop.   Pulmonary:     Effort: Pulmonary effort is normal.     Breath sounds: Normal breath sounds. No wheezing, rhonchi or rales.  Musculoskeletal:     Cervical back: Neck supple.  Skin:    General: Skin is warm.     Findings: No rash.  Neurological:     Mental Status: She is alert and oriented to person, place, and time.  Psychiatric:        Behavior: Behavior normal.    Previous notes and tests were reviewed. The plan was reviewed with the patient/family, and all questions/concerned were addressed.  It was my pleasure to see Cassandra Tucker today and participate in her care. Please feel free to contact me with any questions or concerns.  Sincerely,  Wyline Mood, DO Allergy & Immunology  [facility] office: [phone] [facility] office: [phone] [place] office: [phone]

## 2020-02-19 ENCOUNTER — Encounter: Payer: Self-pay | Admitting: Allergy

## 2020-02-19 ENCOUNTER — Ambulatory Visit (INDEPENDENT_AMBULATORY_CARE_PROVIDER_SITE_OTHER): Payer: Medicaid Other | Admitting: Allergy

## 2020-02-19 ENCOUNTER — Encounter (INDEPENDENT_AMBULATORY_CARE_PROVIDER_SITE_OTHER): Payer: Self-pay | Admitting: Bariatrics

## 2020-02-19 VITALS — BP 150/80 | HR 82 | Temp 96.5°F | Resp 18 | Wt 321.8 lb

## 2020-02-19 DIAGNOSIS — J3089 Other allergic rhinitis: Secondary | ICD-10-CM | POA: Diagnosis not present

## 2020-02-19 DIAGNOSIS — Z7189 Other specified counseling: Secondary | ICD-10-CM | POA: Diagnosis not present

## 2020-02-19 DIAGNOSIS — J302 Other seasonal allergic rhinitis: Secondary | ICD-10-CM | POA: Diagnosis not present

## 2020-02-19 DIAGNOSIS — H1010 Acute atopic conjunctivitis, unspecified eye: Secondary | ICD-10-CM | POA: Diagnosis not present

## 2020-02-19 DIAGNOSIS — J4550 Severe persistent asthma, uncomplicated: Secondary | ICD-10-CM

## 2020-02-19 DIAGNOSIS — Z7185 Encounter for immunization safety counseling: Secondary | ICD-10-CM | POA: Insufficient documentation

## 2020-02-19 LAB — CBC WITH DIFFERENTIAL/PLATELET
Basophils Absolute: 0 10*3/uL (ref 0.0–0.2)
Basos: 0 %
EOS (ABSOLUTE): 0.4 10*3/uL (ref 0.0–0.4)
Eos: 5 %
Hematocrit: 40.5 % (ref 34.0–46.6)
Hemoglobin: 12.4 g/dL (ref 11.1–15.9)
Immature Grans (Abs): 0 10*3/uL (ref 0.0–0.1)
Immature Granulocytes: 0 %
Lymphocytes Absolute: 2.5 10*3/uL (ref 0.7–3.1)
Lymphs: 33 %
MCH: 23.8 pg — ABNORMAL LOW (ref 26.6–33.0)
MCHC: 30.6 g/dL — ABNORMAL LOW (ref 31.5–35.7)
MCV: 78 fL — ABNORMAL LOW (ref 79–97)
Monocytes Absolute: 0.5 10*3/uL (ref 0.1–0.9)
Monocytes: 6 %
Neutrophils Absolute: 4.2 10*3/uL (ref 1.4–7.0)
Neutrophils: 56 %
Platelets: 270 10*3/uL (ref 150–450)
RBC: 5.2 x10E6/uL (ref 3.77–5.28)
RDW: 14.7 % (ref 11.7–15.4)
WBC: 7.6 10*3/uL (ref 3.4–10.8)

## 2020-02-19 LAB — T3: T3, Total: 141 ng/dL (ref 71–180)

## 2020-02-19 LAB — COMPREHENSIVE METABOLIC PANEL
ALT: 8 IU/L (ref 0–32)
AST: 10 IU/L (ref 0–40)
Albumin/Globulin Ratio: 1.5 (ref 1.2–2.2)
Albumin: 4.3 g/dL (ref 3.9–5.0)
Alkaline Phosphatase: 67 IU/L (ref 48–121)
BUN/Creatinine Ratio: 17 (ref 9–23)
BUN: 10 mg/dL (ref 6–20)
Bilirubin Total: 0.7 mg/dL (ref 0.0–1.2)
CO2: 25 mmol/L (ref 20–29)
Calcium: 9.4 mg/dL (ref 8.7–10.2)
Chloride: 104 mmol/L (ref 96–106)
Creatinine, Ser: 0.6 mg/dL (ref 0.57–1.00)
GFR calc Af Amer: 143 mL/min/{1.73_m2} (ref >59)
GFR calc non Af Amer: 124 mL/min/{1.73_m2} (ref >59)
Globulin, Total: 2.8 g/dL (ref 1.5–4.5)
Glucose: 90 mg/dL (ref 65–99)
Potassium: 4.7 mmol/L (ref 3.5–5.2)
Sodium: 140 mmol/L (ref 134–144)
Total Protein: 7.1 g/dL (ref 6.0–8.5)

## 2020-02-19 LAB — TSH: TSH: 1.82 u[IU]/mL (ref 0.450–4.500)

## 2020-02-19 LAB — HEMOGLOBIN A1C
Est. average glucose Bld gHb Est-mCnc: 120 mg/dL
Hgb A1c MFr Bld: 5.8 % — ABNORMAL HIGH (ref 4.8–5.6)

## 2020-02-19 LAB — LIPID PANEL WITH LDL/HDL RATIO
Cholesterol, Total: 214 mg/dL — ABNORMAL HIGH (ref 100–199)
HDL: 38 mg/dL — ABNORMAL LOW (ref >39)
LDL Chol Calc (NIH): 150 mg/dL — ABNORMAL HIGH (ref 0–99)
LDL/HDL Ratio: 3.9 ratio — ABNORMAL HIGH (ref 0.0–3.2)
Triglycerides: 142 mg/dL (ref 0–149)
VLDL Cholesterol Cal: 26 mg/dL (ref 5–40)

## 2020-02-19 LAB — INSULIN, RANDOM: INSULIN: 20.9 u[IU]/mL (ref 2.6–24.9)

## 2020-02-19 LAB — VITAMIN D 25 HYDROXY (VIT D DEFICIENCY, FRACTURES): Vit D, 25-Hydroxy: 27.6 ng/mL — ABNORMAL LOW (ref 30.0–100.0)

## 2020-02-19 LAB — T4, FREE: Free T4: 1.24 ng/dL (ref 0.82–1.77)

## 2020-02-19 MED ORDER — ALBUTEROL SULFATE (2.5 MG/3ML) 0.083% IN NEBU: 2.5000 mg | INHALATION_SOLUTION | RESPIRATORY_TRACT | 1 refills | Status: DC | PRN

## 2020-02-19 NOTE — Patient Instructions (Addendum)
Environmental allergies  Past skin testing showed: Positive to grass, dust mites, cats, feathers.  Continue environmental control measures as below.  May use over the counter antihistamines such as Zyrtec (cetirizine), Claritin (loratadine), Allegra (fexofenadine), or Xyzal (levocetirizine) daily. May take it twice a day if needed.   Use Flonase 1 spray per nostril twice a day for nasal congestion.  Use azelastine 1-2 spray per nostril twice a day for runny nose.   Asthma: . Will let you know about the biologics injectable for the asthma.  o Handout given for Nucala and Fasenra. o Consent was signed.  . Daily controller medication(s): continue Symbicort 2 puffs twice a day with spacer and rinse mouth afterwards. . Continue Spiriva 1.6mcg 2 puffs once a day. Continue Singulair (montelukast) 10mg  daily at night. May use albuterol rescue inhaler 2 puffs every 4 to 6 hours as needed for shortness of breath, chest tightness, coughing, and wheezing. May use albuterol rescue inhaler 2 puffs 5 to 15 minutes prior to strenuous physical activities. Monitor frequency of use.  . Asthma control goals:  o Full participation in all desired activities (may need albuterol before activity) o Albuterol use two times or less a week on average (not counting use with activity) o Cough interfering with sleep two times or less a month o Oral steroids no more than once a year o No hospitalizations   Try to walk at least 5000 steps every day.   Recommend getting the COVID-19 vaccine.  Here's more information:  Follow up in 3 months or sooner if needed.   Reducing Pollen Exposure . Pollen seasons: trees (spring), grass (summer) and ragweed/weeds (fall). 10-19-1979 Keep windows closed in your home and car to lower pollen exposure.  Marland Kitchen air conditioning in the bedroom and throughout the house if possible.  . Avoid going out in dry windy days - especially early  morning. . Pollen counts are highest between 5 - 10 AM and on dry, hot and windy days.  . Save outside activities for late afternoon or after a heavy rain, when pollen levels are lower.  . Avoid mowing of grass if you have grass pollen allergy. [name] Be aware that pollen can also be transported indoors on people and pets.  . Dry your clothes in an automatic dryer rather than hanging them outside where they might collect pollen.  . Rinse hair and eyes before bedtime. Control of House Dust Mite Allergen . Dust mite allergens are a common trigger of allergy and asthma symptoms. While they can be found throughout the house, these microscopic creatures thrive in warm, humid environments such as bedding, upholstered furniture and carpeting. . Because so much time is spent in the bedroom, it is essential to reduce mite levels there.  . Encase pillows, mattresses, and box springs in special allergen-proof fabric covers or airtight, zippered plastic covers.  . Bedding should be washed weekly in hot water (130 F) and dried in a hot dryer. Allergen-proof covers are available for comforters and pillows that can't be regularly washed.  Marland Kitchen the allergy-proof covers every few months. Minimize clutter in the bedroom. Keep pets out of the bedroom.  [name] Keep humidity less than 50% by using a dehumidifier or air conditioning. You can buy a humidity measuring device called a hygrometer to monitor this.  . If possible, replace carpets with hardwood, linoleum, or washable area rugs. If that's not possible, vacuum frequently with a vacuum that has a HEPA filter. . Remove all  upholstered furniture and non-washable window drapes from the bedroom. . Remove all non-washable stuffed toys from the bedroom.  Wash stuffed toys weekly. Pet Allergen Avoidance: . Contrary to popular opinion, there are no "hypoallergenic" breeds of dogs or cats. That is because people are not allergic to an animal's hair, but to an allergen found in  the animal's saliva, dander (dead skin flakes) or urine. Pet allergy symptoms typically occur within minutes. For some people, symptoms can build up and become most severe 8 to 12 hours after contact with the animal. People with severe allergies can experience reactions in public places if dander has been transported on the pet owners' clothing. Marland Kitchen Keeping an animal outdoors is only a partial solution, since homes with pets in the yard still have higher concentrations of animal allergens. . Before getting a pet, ask your allergist to determine if you are allergic to animals. If your pet is already considered part of your family, try to minimize contact and keep the pet out of the bedroom and other rooms where you spend a great deal of time. . As with dust mites, vacuum carpets often or replace carpet with a hardwood floor, tile or linoleum. . High-efficiency particulate air (HEPA) cleaners can reduce allergen levels over time. . While dander and saliva are the source of cat and dog allergens, urine is the source of allergens from rabbits, hamsters, mice and [place] pigs; so ask a non-allergic family member to clean the animal's cage. . If you have a pet allergy, talk to your allergist about the potential for allergy immunotherapy (allergy shots). This strategy can often provide long-term relief.

## 2020-02-19 NOTE — Assessment & Plan Note (Addendum)
Past history - Symptoms of chest tightness, shortness of breath, coughing, wheezing, nocturnal awakenings for 1 year. Daily symptoms and using albuterol 1-2 times a day. No daily ICS inhalers. History of reflux but no longer on PPI. Ex-smoker. 2021 spirometry showed: normal pattern and 13% improvement in FEV1 post bronchodilator treatment. Clinically feeling better. Interim history - doing better and using albuterol less but still using daily mainly with exertion. Asthma worsened since gained significant weight.  . Today's spirometry was unremarkable. . Discussed biologics injectable for asthma. Consent was signed. Eosinophils were 400 on 02/18/20. IgE level was not drawn for some reason. o Start Fasenra injections and will send note to our biologics coordinator to start paperwork - patient called after visit and made aware of plan.  . Daily controller medication(s): continue Symbicort 2 puffs twice a day with spacer and rinse mouth afterwards. . Continue Spiriva 1.41mcg 2 puffs once a day. Continue Singulair (montelukast) 10mg  daily at night. May use albuterol rescue inhaler 2 puffs every 4 to 6 hours as needed for shortness of breath, chest tightness, coughing, and wheezing. May use albuterol rescue inhaler 2 puffs 5 to 15 minutes prior to strenuous physical activities. Monitor frequency of use.  Discussed that some of her exertion related breathing issues most likely due to physical deconditioning. Discussed and encouraged exercise/weight loss.  Repeat spirometry at next visit.

## 2020-02-19 NOTE — Assessment & Plan Note (Addendum)
   Patient is hesitant about the vaccine and is not interested in getting it at this time.   Recommend getting the COVID-19 vaccine and gave website link to learn more about it.

## 2020-02-19 NOTE — Assessment & Plan Note (Signed)
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 No previous ENT evaluation. Tried Claritin, Flonase and Zyrtec with some benefit. 2021 skin testing showed: Positive to grass, dust mites, cats, feathers. Negative to common foods.  Interim history - stable with below regimen.  Continue environmental control measures as below.  May use over the counter antihistamines such as Zyrtec (cetirizine), Claritin (loratadine), Allegra (fexofenadine), or Xyzal (levocetirizine) daily. May take it twice a day if needed.   Use Flonase 1 spray per nostril twice a day for nasal congestion.  Use azelastine 1-2 spray per nostril twice a day for runny nose.   If above regimen does not control symptoms then will discuss starting allergy immunotherapy once asthma is more stable.

## 2020-02-20 LAB — CBC WITH DIFFERENTIAL/PLATELET
Basophils Absolute: 0 10*3/uL (ref 0.0–0.2)
Basos: 0 %
EOS (ABSOLUTE): 0.5 10*3/uL — ABNORMAL HIGH (ref 0.0–0.4)
Eos: 5 %
Hematocrit: 37.7 % (ref 34.0–46.6)
Hemoglobin: 12.2 g/dL (ref 11.1–15.9)
Immature Grans (Abs): 0 10*3/uL (ref 0.0–0.1)
Immature Granulocytes: 0 %
Lymphocytes Absolute: 2.9 10*3/uL (ref 0.7–3.1)
Lymphs: 28 %
MCH: 24.6 pg — ABNORMAL LOW (ref 26.6–33.0)
MCHC: 32.4 g/dL (ref 31.5–35.7)
MCV: 76 fL — ABNORMAL LOW (ref 79–97)
Monocytes Absolute: 0.6 10*3/uL (ref 0.1–0.9)
Monocytes: 5 %
Neutrophils Absolute: 6.3 10*3/uL (ref 1.4–7.0)
Neutrophils: 62 %
Platelets: 282 10*3/uL (ref 150–450)
RBC: 4.95 x10E6/uL (ref 3.77–5.28)
RDW: 14.5 % (ref 11.7–15.4)
WBC: 10.3 10*3/uL (ref 3.4–10.8)

## 2020-02-20 LAB — IGE: IgE (Immunoglobulin E), Serum: 323 IU/mL (ref 6–495)

## 2020-02-23 NOTE — Progress Notes (Signed)
Dear Cassandra Pierini, FNP,   Thank you for referring Cassandra Tucker to our clinic. The following note includes my evaluation and treatment recommendations.  Chief Complaint:   OBESITY Cassandra Tucker (MR# [id number]) is a 29 y.o. female who presents for evaluation and treatment of obesity and related comorbidities. Current BMI is Body mass index is 52.59 kg/m. Cassandra Tucker has been struggling with her weight for many years and has been unsuccessful in either losing weight, maintaining weight loss, or reaching her healthy weight goal.  Cassandra Tucker is currently in the action stage of change and ready to dedicate time achieving and maintaining a healthier weight. Cassandra Tucker is interested in becoming our patient and working on intensive lifestyle modifications including (but not limited to) diet and exercise for weight loss.  Cassandra Tucker is a new patient today, but is returning from her last visit on 04/16/2018 with Dr. Dalbert Tucker.  She has lost weight since her last visit (down 61 pounds since last visit).  Cassandra Tucker's habits were reviewed today and are as follows: Her family eats meals together, she thinks her family will eat healthier with her, her desired weight loss is 110 pounds, she has been heavy most of her life, she started gaining weight in middle school, her heaviest weight ever was 410 pounds, she is a picky eater and doesn't like to eat healthier foods, she craves seafood and steaks, she snacks frequently in the evenings, she skips breakfast everyday and lunch most of the time, she is frequently drinking liquids with calories, she frequently makes poor food choices, she sometimes has problems with excessive hunger, she sometimes eats larger portions than normal and she struggles with emotional eating.  Depression Screen Cassandra Tucker's Food and Mood (modified PHQ-9) score was 26.  Depression screen PHQ 2/9 02/18/2020  Decreased Interest 3  Down, Depressed, Hopeless 3  PHQ - 2 Score 6    Altered sleeping 3  Tired, decreased energy 3  Change in appetite 3  Feeling bad or failure about yourself  2  Trouble concentrating 3  Moving slowly or fidgety/restless 3  Suicidal thoughts 3  PHQ-9 Score 26  Difficult doing work/chores Extremely dIfficult   Subjective:   1. Other fatigue Cassandra Tucker admits to daytime somnolence and reports waking up still tired. Patent has a history of symptoms of daytime fatigue, morning fatigue and snoring sometimes. Cassandra Tucker generally gets 4-6 hours of sleep per night, and states that she has poor quality sleep. Snoring is present. Apneic episodes are present. Epworth Sleepiness Score is 14.  2. SOB (shortness of breath) on exertion Cassandra Tucker notes increasing shortness of breath with exercising and seems to be worsening over time with weight gain. She notes getting out of breath sooner with activity than she used to. This has gotten worse recently. Cassandra Tucker denies shortness of breath at rest or orthopnea.  3. Essential hypertension Review: taking medications as instructed, no medication side effects noted, no chest pain on exertion, no dyspnea on exertion, no swelling of ankles.  She had a blood pressure medication prior to surgery.  She is now on no medication.  Blood pressure is slightly elevated.  BP Readings from Last 3 Encounters:  02/19/20 (!) 150/80  02/18/20 (!) 146/88  01/31/20 (!) 156/89   4. OSA on CPAP Cassandra Tucker has a diagnosis of sleep apnea. She reports that she is not using a CPAP regularly.  She says she wore her CPAP prior to bariatric surgery.  5. Gastroesophageal reflux disease without esophagitis She is on no  medications for this.  She has occasional signs and symptoms.  6. Diabetes mellitus type 2 in obese (HCC) Medications reviewed. Diabetic ROS: no polyuria or polydipsia, no chest pain, dyspnea or TIA's, no numbness, tingling or pain in extremities.  She is on no medications.  Lab Results  Component Value Date   HGBA1C  5.8 (H) 02/18/2020   HGBA1C 4.8 08/10/2017   HGBA1C 5.6 05/01/2017   Lab Results  Component Value Date   LDLCALC 150 (H) 02/18/2020   CREATININE 0.60 02/18/2020   Lab Results  Component Value Date   INSULIN 20.9 02/18/2020   INSULIN 60.5 (H) 02/14/2017   7. Pseudotumor cerebri syndrome She is followed by Neurology for pseudotumor cerebri.  8. Other hyperlipidemia Cassandra Tucker has hyperlipidemia and has been trying to improve her cholesterol levels with intensive lifestyle modification including a low saturated fat diet, exercise and weight loss. She denies any chest pain, claudication or myalgias.  She is not on any medication for this.  Lab Results  Component Value Date   ALT 8 02/18/2020   AST 10 02/18/2020   ALKPHOS 67 02/18/2020   BILITOT 0.7 02/18/2020   Lab Results  Component Value Date   CHOL 214 (H) 02/18/2020   HDL 38 (L) 02/18/2020   LDLCALC 150 (H) 02/18/2020   TRIG 142 02/18/2020   CHOLHDL 5.3 (H) 11/14/2016   9. History of bariatric surgery Cassandra Tucker had gastric sleeve surgery in 2018.  Her highest weight was 410 pounds.  Her lowest weight was 230 pounds.  She denies complications.  10. Other iron deficiency anemia Cassandra Tucker is not a vegetarian.  She has a history of weight loss surgery.  She is not on an iron supplement.  Last H&H (after childbirth) was 8.4/27.4  CBC Latest Ref Rng & Units 02/18/2020 02/18/2020 07/18/2019  WBC 3.4 - 10.8 x10E3/uL 10.3 7.6 10.7(H)  Hemoglobin 11.1 - 15.9 g/dL 16.112.2 09.612.4 0.4(V8.4(L)  Hematocrit 34.0 - 46.6 % 37.7 40.5 27.4(L)  Platelets 150 - 450 x10E3/uL 282 270 161   Lab Results  Component Value Date   FERRITIN 45 02/07/2016   Lab Results  Component Value Date   VITAMINB12 368 02/14/2017   11. Vitamin D deficiency She is currently taking no vitamin D supplement. She denies nausea, vomiting or muscle weakness.  12. Depression screening Cassandra Tucker was screened for depression as part of her new patient workup  today.  Assessment/Plan:   1. Other fatigue Cassandra Tucker does feel that her weight is causing her energy to be lower than it should be. Fatigue may be related to obesity, depression or many other causes. Labs will be ordered, and in the meanwhile, Enes will focus on self care including making healthy food choices, increasing physical activity and focusing on stress reduction.    - EKG 12-Lead - CBC with Differential/Platelet - Comprehensive metabolic panel - Hemoglobin A1c - Insulin, random - Lipid Panel With LDL/HDL Ratio - T3 - T4, free - TSH - VITAMIN D 25 Hydroxy (Vit-D Deficiency, Fractures)  2. SOB (shortness of breath) on exertion Cassandra Tucker does feel that she gets out of breath more easily that she used to when she exercises. Surena's shortness of breath appears to be obesity related and exercise induced. She has agreed to work on weight loss and gradually increase exercise to treat her exercise induced shortness of breath. Will continue to monitor closely.  - Lipid Panel With LDL/HDL Ratio  3. Essential hypertension Cassandra Tucker is working on healthy weight loss and exercise to improve blood  pressure control. We will watch for signs of hypotension as she continues her lifestyle modifications.  Will follow blood pressure.    - Comprehensive metabolic panel  4. OSA on CPAP Intensive lifestyle modifications are the first line treatment for this issue. We discussed several lifestyle modifications today and she will continue to work on diet, exercise and weight loss efforts. We will continue to monitor. Orders and follow up as documented in patient record.  We discussed the importance of using CPAP machine.  - Comprehensive metabolic panel  5. Gastroesophageal reflux disease without esophagitis Intensive lifestyle modifications are the first line treatment for this issue. We discussed several lifestyle modifications today and she will continue to work on diet, exercise and weight  loss efforts. Orders and follow up as documented in patient record.  Avoid triggers and follow-up with PCP.  Counseling . If a person has gastroesophageal reflux disease (GERD), food and stomach acid move back up into the esophagus and cause symptoms or problems such as damage to the esophagus. . Anti-reflux measures include: raising the head of the bed, avoiding tight clothing or belts, avoiding eating late at night, not lying down shortly after mealtime, and achieving weight loss. . Avoid ASA, NSAID's, caffeine, alcohol, and tobacco.  . OTC Pepcid and/or Tums are often very helpful for as needed use.  Marland Kitchen However, for persisting chronic or daily symptoms, stronger medications like Omeprazole may be needed. . You may need to avoid foods and drinks such as: ? Coffee and tea (with or without caffeine). ? Drinks that contain alcohol. ? Energy drinks and sports drinks. ? Bubbly (carbonated) drinks or sodas. ? Chocolate and cocoa. ? Peppermint and mint flavorings. ? Garlic and onions. ? Horseradish. ? Spicy and acidic foods. These include peppers, chili powder, curry powder, vinegar, hot sauces, and BBQ sauce. ? Citrus fruit juices and citrus fruits, such as oranges, lemons, and limes. ? Tomato-based foods. These include red sauce, chili, salsa, and pizza with red sauce. ? Fried and fatty foods. These include donuts, french fries, potato chips, and high-fat dressings. ? High-fat meats. These include hot dogs, rib eye steak, sausage, ham, and bacon.  6. Diabetes mellitus type 2 in obese (HCC) Good blood sugar control is important to decrease the likelihood of diabetic complications such as nephropathy, neuropathy, limb loss, blindness, coronary artery disease, and death. Intensive lifestyle modification including diet, exercise and weight loss are the first line of treatment for diabetes.  Check A1c, insulin, and CMP.  Cassandra Tucker will work on decreasing the carbohydrates in her diet.  - Hemoglobin  A1c - Insulin, random  7. Pseudotumor cerebri syndrome Follow-up with Neurology.  8. Other hyperlipidemia Cardiovascular risk and specific lipid/LDL goals reviewed.  We discussed several lifestyle modifications today and Cassandra Tucker will continue to work on diet, exercise and weight loss efforts. Orders and follow up as documented in patient record.  Eliminate trans fats from your diet and increase MUFAs.  Counseling Intensive lifestyle modifications are the first line treatment for this issue. . Dietary changes: Increase soluble fiber. Decrease simple carbohydrates. . Exercise changes: Moderate to vigorous-intensity aerobic activity 150 minutes per week if tolerated. . Lipid-lowering medications: see documented in medical record.  - Lipid Panel With LDL/HDL Ratio  9. History of bariatric surgery Cassandra Tucker is at risk for malnutrition due to her previous bariatric surgery.  Follow-up with PCP.  Counseling  You may need to eat 3 meals and 2 snacks, or 5 small meals each day in order to reach your  protein and calorie goals.   Allow at least 15 minutes for each meal so that you can eat mindfully. Listen to your body so that you do not overeat. For most people, your sleeve or pouch will comfortably hold 4-6 ounces.  Eat foods from all food groups. This includes fruits and vegetables, grains, dairy, and meat and other proteins.  Include a protein-rich food at every meal and snack, and eat the protein food first.   You should be taking a Bariatric Multivitamin as well as calcium.   10. Other iron deficiency anemia Will check CBC today.  Orders and follow up as documented in patient record.  Counseling . Iron is essential for our bodies to make red blood cells.  Reasons that someone may be deficient include: an iron-deficient diet (more likely in those following vegan or vegetarian diets), women with heavy menses, patients with GI disorders or poor absorption, patients that have had bariatric  surgery, frequent blood donors, patients with cancer, and patients with heart disease.   [place] Cola foods include dark leafy greens, red and white meats, eggs, seafood, and beans.   . Certain foods and drinks prevent your body from absorbing iron properly. Avoid eating these foods in the same meal as iron-rich foods or with iron supplements. These foods include: coffee, black tea, and red wine; milk, dairy products, and foods that are high in calcium; beans and soybeans; whole grains.  . Constipation can be a side effect of iron supplementation. Increased water and fiber intake are helpful. Water goal: > 2 liters/day. Fiber goal: > 25 grams/day.  11. Vitamin D deficiency Low Vitamin D level contributes to fatigue and are associated with obesity, breast, and colon cancer.  Will check vitamin D level today.  - VITAMIN D 25 Hydroxy (Vit-D Deficiency, Fractures)  12. Depression screening Cassandra Tucker had a positive depression screening. Depression is commonly associated with obesity and often results in emotional eating behaviors. We will monitor this closely and work on CBT to help improve the non-hunger eating patterns. Referral to Psychology may be required if no improvement is seen as she continues in our clinic.  13. Class 3 severe obesity with serious comorbidity and body mass index (BMI) of 50.0 to 59.9 in adult, unspecified obesity type (HCC) Cassandra Tucker is currently in the action stage of change and her goal is to continue with weight loss efforts. I recommend Cassandra Tucker begin the structured treatment plan as follows:  She has agreed to the Category 4 Plan -100 calories.  I reviewed her last CBC from 07/18/2019 and CMP from 07/17/2019.  Those and glucose were reviewed with Cassandra Tucker.  She will work on meal planning and intentional eating.  Exercise goals: No exercise has been prescribed at this time.   Behavioral modification strategies: increasing lean protein intake, decreasing simple carbohydrates,  increasing vegetables, increasing water intake, decreasing eating out, no skipping meals, meal planning and cooking strategies, keeping healthy foods in the home and planning for success.  She was informed of the importance of frequent follow-up visits to maximize her success with intensive lifestyle modifications for her multiple health conditions. She was informed we would discuss her lab results at her next visit unless there is a critical issue that needs to be addressed sooner. Cassandra Tucker agreed to keep her next visit at the agreed upon time to discuss these results.  Objective:   Blood pressure (!) 146/88, pulse 89, temperature 98.2 F (36.8 C), height 5\' 5"  (1.651 m), weight (!) 316 lb (143.3 kg),  SpO2 97 %, unknown if currently breastfeeding. Body mass index is 52.59 kg/m.  EKG: Normal sinus rhythm, rate 90 bpm.  Indirect Calorimeter completed today shows a VO2 of 391 and a REE of 2724.  Her calculated basal metabolic rate is 7989 thus her basal metabolic rate is better than expected.  General: Cooperative, alert, well developed, in no acute distress. HEENT: Conjunctivae and lids unremarkable. Cardiovascular: Regular rhythm.  Lungs: Normal work of breathing. Neurologic: No focal deficits.   Lab Results  Component Value Date   CREATININE 0.60 02/18/2020   BUN 10 02/18/2020   NA 140 02/18/2020   K 4.7 02/18/2020   CL 104 02/18/2020   CO2 25 02/18/2020   Lab Results  Component Value Date   ALT 8 02/18/2020   AST 10 02/18/2020   ALKPHOS 67 02/18/2020   BILITOT 0.7 02/18/2020   Lab Results  Component Value Date   HGBA1C 5.8 (H) 02/18/2020   HGBA1C 4.8 08/10/2017   HGBA1C 5.6 05/01/2017   HGBA1C 6.0 (H) 02/14/2017   HGBA1C 6.9 11/14/2016   Lab Results  Component Value Date   INSULIN 20.9 02/18/2020   INSULIN 60.5 (H) 02/14/2017   Lab Results  Component Value Date   TSH 1.820 02/18/2020   Lab Results  Component Value Date   CHOL 214 (H) 02/18/2020   HDL 38 (L)  02/18/2020   LDLCALC 150 (H) 02/18/2020   TRIG 142 02/18/2020   CHOLHDL 5.3 (H) 11/14/2016   Lab Results  Component Value Date   WBC 10.3 02/18/2020   HGB 12.2 02/18/2020   HCT 37.7 02/18/2020   MCV 76 (L) 02/18/2020   PLT 282 02/18/2020   Lab Results  Component Value Date   FERRITIN 45 02/07/2016   Attestation Statements:   Reviewed by clinician on day of visit: allergies, medications, problem list, medical history, surgical history, family history, social history, and previous encounter notes.  Time spent on visit including pre-visit chart review and post-visit charting and care was 60 minutes.   I, Insurance claims handler, CMA, am acting as Energy manager for [employer], DO  I have reviewed the above documentation for accuracy and completeness, and I agree with the above. Corinna Capra, DO

## 2020-02-24 ENCOUNTER — Telehealth: Payer: Self-pay | Admitting: *Deleted

## 2020-02-24 ENCOUNTER — Encounter (INDEPENDENT_AMBULATORY_CARE_PROVIDER_SITE_OTHER): Payer: Self-pay | Admitting: Bariatrics

## 2020-02-24 NOTE — Telephone Encounter (Signed)
-----   Message from Ellamae Sia, DO sent at 02/19/2020  4:32 PM EDT ----- Can you start PA for Fasenra injection for asthma?Eos was 400 on 02/18/20. Thank you.

## 2020-02-24 NOTE — Telephone Encounter (Signed)
Called patient and advised no PA for Fasenra required and submit to [employer].  Instructed patient once Rx received to put in fridge and reach out to office to bring medication into office for instruction and start

## 2020-02-25 ENCOUNTER — Ambulatory Visit: Payer: Medicaid Other | Admitting: Allergy

## 2020-03-03 ENCOUNTER — Other Ambulatory Visit: Payer: Self-pay

## 2020-03-03 ENCOUNTER — Ambulatory Visit (INDEPENDENT_AMBULATORY_CARE_PROVIDER_SITE_OTHER): Payer: Medicaid Other | Admitting: Bariatrics

## 2020-03-03 ENCOUNTER — Encounter (INDEPENDENT_AMBULATORY_CARE_PROVIDER_SITE_OTHER): Payer: Self-pay | Admitting: Family Medicine

## 2020-03-03 ENCOUNTER — Ambulatory Visit (INDEPENDENT_AMBULATORY_CARE_PROVIDER_SITE_OTHER): Payer: Medicaid Other | Admitting: Family Medicine

## 2020-03-03 VITALS — BP 150/93 | HR 105 | Temp 98.1°F | Ht 65.0 in | Wt 320.0 lb

## 2020-03-03 DIAGNOSIS — E782 Mixed hyperlipidemia: Secondary | ICD-10-CM | POA: Insufficient documentation

## 2020-03-03 DIAGNOSIS — R7303 Prediabetes: Secondary | ICD-10-CM

## 2020-03-03 DIAGNOSIS — E559 Vitamin D deficiency, unspecified: Secondary | ICD-10-CM | POA: Diagnosis not present

## 2020-03-04 MED ORDER — VITAMIN D (ERGOCALCIFEROL) 1.25 MG (50000 UNIT) PO CAPS: 50000.0000 [IU] | ORAL_CAPSULE | ORAL | 0 refills | Status: DC

## 2020-03-05 NOTE — Progress Notes (Signed)
Chief Complaint:   OBESITY Cassandra Tucker is here to discuss her progress with her obesity treatment plan along with follow-up of her obesity related diagnoses. Cassandra Tucker is on the Category 4 Plan + 100 calories and states she is following her eating plan approximately 0% of the time. Cassandra Tucker states she is walking for 30-40 minutes 4-5 times per week.  Today's visit was #: 2 Starting weight: 316 lbs Starting date: 02/18/2020 Today's weight: 320 lbs Today's date: 03/03/2020 Total lbs lost to date: 0 Total lbs lost since last in-office visit: 0  Interim History: Cassandra Tucker has struggled to follow a structured plan. She is status post weight loss surgery, and she is unable to eat a large volume of food. She feels that she doesn't eat enough.  Subjective:   1. Vitamin D deficiency Cassandra Tucker's Vit D level is low, and she notes fatigue. She is not currently on Vit D. I discussed labs with the patient today.  2. Hyperlipidemia, mixed Cassandra Tucker's HDL is low and LDL is elevated. She is not on statin, and she wants to improve with diet and weight loss. I discussed labs with the patient today.  3. Pre-diabetes Cassandra Tucker's A1c is elevated at 5.8. She is working on diet, and she denies polyphagia. I discussed labs with the patient today.  Assessment/Plan:   1. Vitamin D deficiency Low Vitamin D level contributes to fatigue and are associated with obesity, breast, and colon cancer. Cassandra Tucker agreed to start prescription Vitamin D 50,000 IU every week, with no refill. She will follow-up for routine testing of Vitamin D, at least 2-3 times per year to avoid over-replacement.  - Vitamin D, Ergocalciferol, (DRISDOL) 1.25 MG (50000 UNIT) CAPS capsule; Take 1 capsule (50,000 Units total) by mouth every 7 (seven) days.  Dispense: 4 capsule; Refill: 0  2. Hyperlipidemia, mixed Cardiovascular risk and specific lipid/LDL goals reviewed. We discussed several lifestyle modifications today. Cassandra Tucker will continue to  work on diet, exercise and weight loss efforts. Orders and follow up as documented in patient record.   3. Pre-diabetes Cassandra Tucker will continue to work on weight loss, diet, exercise, and decreasing simple carbohydrates to help decrease the risk of diabetes. We will recheck labs in 3 months.  4. Class 3 severe obesity with serious comorbidity and body mass index (BMI) of 50.0 to 59.9 in adult, unspecified obesity type (HCC) Cassandra Tucker is currently in the action stage of change. As such, her goal is to continue with weight loss efforts. She has agreed to change to keeping a food journal and adhering to recommended goals of 1500-1700 calories and 100+ grams of protein daily.   Exercise goals: As is.  Behavioral modification strategies: increasing lean protein intake and increasing water intake.  Cassandra Tucker has agreed to follow-up with our clinic in 3 weeks. She was informed of the importance of frequent follow-up visits to maximize her success with intensive lifestyle modifications for her multiple health conditions.   Objective:   Blood pressure (!) 150/93, pulse (!) 105, temperature 98.1 F (36.7 C), height 5\' 5"  (1.651 m), weight (!) 320 lb (145.2 kg), SpO2 97 %, unknown if currently breastfeeding. Body mass index is 53.25 kg/m.  General: Cooperative, alert, well developed, in no acute distress. HEENT: Conjunctivae and lids unremarkable. Cardiovascular: Regular rhythm.  Lungs: Normal work of breathing. Neurologic: No focal deficits.   Lab Results  Component Value Date   CREATININE 0.60 02/18/2020   BUN 10 02/18/2020   NA 140 02/18/2020   K 4.7 02/18/2020  CL 104 02/18/2020   CO2 25 02/18/2020   Lab Results  Component Value Date   ALT 8 02/18/2020   AST 10 02/18/2020   ALKPHOS 67 02/18/2020   BILITOT 0.7 02/18/2020   Lab Results  Component Value Date   HGBA1C 5.8 (H) 02/18/2020   HGBA1C 4.8 08/10/2017   HGBA1C 5.6 05/01/2017   HGBA1C 6.0 (H) 02/14/2017   HGBA1C 6.9  11/14/2016   Lab Results  Component Value Date   INSULIN 20.9 02/18/2020   INSULIN 60.5 (H) 02/14/2017   Lab Results  Component Value Date   TSH 1.820 02/18/2020   Lab Results  Component Value Date   CHOL 214 (H) 02/18/2020   HDL 38 (L) 02/18/2020   LDLCALC 150 (H) 02/18/2020   TRIG 142 02/18/2020   CHOLHDL 5.3 (H) 11/14/2016   Lab Results  Component Value Date   WBC 10.3 02/18/2020   HGB 12.2 02/18/2020   HCT 37.7 02/18/2020   MCV 76 (L) 02/18/2020   PLT 282 02/18/2020   Lab Results  Component Value Date   FERRITIN 45 02/07/2016   Attestation Statements:   Reviewed by clinician on day of visit: allergies, medications, problem list, medical history, surgical history, family history, social history, and previous encounter notes.  Time spent on visit including pre-visit chart review and post-visit care and charting was 42 minutes.    I, Burt Knack, am acting as transcriptionist for Quillian Quince, MD.  I have reviewed the above documentation for accuracy and completeness, and I agree with the above. -  Quillian Quince, MD

## 2020-03-11 ENCOUNTER — Other Ambulatory Visit: Payer: Self-pay | Admitting: *Deleted

## 2020-03-11 ENCOUNTER — Telehealth: Payer: Self-pay | Admitting: Allergy

## 2020-03-11 MED ORDER — VENTOLIN HFA 108 (90 BASE) MCG/ACT IN AERS: 2.0000 | INHALATION_SPRAY | Freq: Four times a day (QID) | RESPIRATORY_TRACT | 1 refills | Status: DC | PRN

## 2020-03-11 NOTE — Telephone Encounter (Signed)
I called and spoke with the patient and I advised that I was not sure if insurance would still cover the Ventolin inhaler if she picked up the ProAir and that she should call the pharmacy to confirm this. Patient verbalized understanding. She will call back with the Rx information so that I can try to do a Prior Authorization for the Ventolin to see if it can be approved. Will call with an update.

## 2020-03-11 NOTE — Telephone Encounter (Signed)
Patient would also like to know if she picks up the [employer] today, if insurance will still pay for the Ventolin when it is approved.

## 2020-03-11 NOTE — Telephone Encounter (Signed)
Patient states that Medicaid will only cover [employer]. She said she has been on Ventolin and it works better. The pharmacy told her she needs a prior authorization in order to get Ventolin. [facility].

## 2020-03-11 NOTE — Telephone Encounter (Signed)
PA has been submitted through CoverMyMeds for Ventolin HFA and is currently pending approval or denial. Rx information [id number], [id number], [id number], [id number]

## 2020-03-11 NOTE — Telephone Encounter (Signed)
PA has been approved for Ventolin HFA. PA form has been labeled and placed in bulk scanning. Called patient and left a voicemail advising per [facility] permission.

## 2020-03-19 ENCOUNTER — Encounter: Payer: Self-pay | Admitting: Allergy

## 2020-03-24 ENCOUNTER — Encounter (INDEPENDENT_AMBULATORY_CARE_PROVIDER_SITE_OTHER): Payer: Self-pay | Admitting: Family Medicine

## 2020-03-24 ENCOUNTER — Other Ambulatory Visit: Payer: Self-pay

## 2020-03-24 ENCOUNTER — Ambulatory Visit (INDEPENDENT_AMBULATORY_CARE_PROVIDER_SITE_OTHER): Payer: Medicaid Other | Admitting: Family Medicine

## 2020-03-24 ENCOUNTER — Encounter (INDEPENDENT_AMBULATORY_CARE_PROVIDER_SITE_OTHER): Payer: Self-pay

## 2020-03-24 VITALS — BP 152/88 | HR 108 | Temp 98.2°F | Ht 65.0 in | Wt 319.0 lb

## 2020-03-24 DIAGNOSIS — Z6841 Body Mass Index (BMI) 40.0 and over, adult: Secondary | ICD-10-CM | POA: Diagnosis not present

## 2020-03-24 DIAGNOSIS — I10 Essential (primary) hypertension: Secondary | ICD-10-CM

## 2020-03-24 DIAGNOSIS — E1169 Type 2 diabetes mellitus with other specified complication: Secondary | ICD-10-CM

## 2020-03-24 MED ORDER — VICTOZA 18 MG/3ML ~~LOC~~ SOPN: 0.6000 mg | PEN_INJECTOR | Freq: Every day | SUBCUTANEOUS | 0 refills | Status: DC

## 2020-03-24 NOTE — Progress Notes (Signed)
Follow Up Note  RE: Cassandra Tucker MRN: [id number] DOB: 1990-10-29 Date of Office Visit: 03/25/2020  Referring provider: [name], * Primary care provider: [name], FNP  Chief Complaint: Asthma  History of Present Illness: I had the pleasure of seeing Cassandra Tucker for a follow up visit at the [facility] on 03/25/2020. She is a 29 y.o. female, who is being followed for asthma and allergic rhinoconjunctivitis. Her previous allergy office visit was on 02/19/2020 with Dr. Selena Batten. Today is a regular follow up visit.  Asthma: Patient has not started Fasenra injections yet - would like [place] to administer first dose today.  She has been having issues with her breathing for the past 2 weeks.  Complaining of shortness of breath and feels like she can't take a full breathe. Some wheezing and coughing. Using albuterol 2 puffs every hour for the past week with good benefit. Denies any fevers, chills.   Currently on Symbicort 2 puffs once a day, Spiriva 1.29mcg 2 puffs once a day and Singulair in the mornings.   Denies any ER/urgent care visits or prednisone use since the last visit.  Seasonal and perennial allergic rhinoconjunctivitis Taking Claritin daily, Flonase and azelastine with good benefit.   Assessment and Plan: Cassandra Tucker is a 29 y.o. female with: Severe persistent asthma with (acute) exacerbation Past history - Symptoms of chest tightness, shortness of breath, coughing, wheezing, nocturnal awakenings for 1 year. History of reflux but no longer on PPI. Ex-smoker. 2021 spirometry showed: normal pattern and 13% improvement in FEV1 post bronchodilator treatment. Clinically feeling better. Interim history - current flare going on for the past 1-2 weeks. Using albuterol 2 puffs every hour with good benefit. Did not start Harrington Challenger yet. No additional prednisone since last visit. Using Symbicort once a day instead of twice a day.  . Today's  spirometry showed: possible restrictive disease with 13% improvement in FEV1 post bronchodilator treatment. Clinically feeling better but still wheezing on exam.  . Continue Fasenra injections every 4 weeks x 2 then every 8 weeks - injection given today.  . Depo 80mg  given today.  Start Alvesco 1 puffs twice a day with spacer and rinse mouth afterwards for 1-2 weeks until your breathing symptoms return to baseline. Sample given.  . Daily controller medication(s): TAKE Symbicort 2 puffs twice a day with spacer and rinse mouth afterwards. . Continue Spiriva 1.46mcg 2 puffs once a day. Continue Singulair (montelukast) 10mg  daily at night. May use albuterol rescue inhaler 2 puffs or nebulizer every 4 to 6 hours as needed for shortness of breath, chest tightness, coughing, and wheezing. May use albuterol rescue inhaler 2 puffs 5 to 15 minutes prior to strenuous physical activities. Monitor frequency of use.  During upper respiratory infections/asthma flares: start Alvesco 32m 1 puffs twice a day with spacer and rinse mouth afterwards for 1-2 weeks until your breathing symptoms return to baseline.  Discussed that some of her exertion related breathing issues most likely due to physical deconditioning. Discussed and encouraged exercise/weight loss.  Repeat spirometry at next visit. Get sleep study done - see PCP for this.  Recommend getting COVID-19 vaccination.  Seasonal and perennial allergic rhinoconjunctivitis Past history - Perennial rhino conjunctivitis symptoms for 20+ years with worsening in the spring. No previous ENT evaluation. Tried Claritin, Flonase and Zyrtec with some benefit. 2021 skin testing showed: Positive to grass, dust mites, cats, feathers. Negative to common foods.  Interim history - stable with below regimen.  Continue environmental control measures as below.  May use over the counter antihistamines such as Zyrtec (cetirizine), Claritin (loratadine), Allegra  (fexofenadine), or Xyzal (levocetirizine) daily. May take it twice a day if needed.   Use Flonase 1 spray per nostril twice a day for nasal congestion.  Use azelastine 1-2 spray per nostril twice a day for runny nose.   If above regimen does not control symptoms then will discuss starting allergy immunotherapy once asthma is more stable.   Return in about 2 months (around 05/25/2020).  Meds ordered this encounter  Medications  . Benralizumab SOSY 30 mg  . methylPREDNISolone acetate (DEPO-MEDROL) injection 80 mg   Diagnostics: Spirometry:  Tracings reviewed. Her effort: Good reproducible efforts. FVC: 2.31L FEV1: 2.03L, 61% predicted FEV1/FVC ratio: 88% Interpretation: Spirometry consistent with possible restrictive disease with 13% improvement in FEV1 post bronchodilator treatment. Clinically feeling better. Still wheezing. Please see scanned spirometry results for details.  Medication List:  Current Outpatient Medications  Medication Sig Dispense Refill  . albuterol (PROVENTIL) (2.5 MG/3ML) 0.083% nebulizer solution Take 3 mLs (2.5 mg total) by nebulization every 4 (four) hours as needed for wheezing or shortness of breath. 75 mL 1  . budesonide-formoterol (SYMBICORT) 160-4.5 MCG/ACT inhaler Inhale 2 puffs into the lungs in the morning and at bedtime. with spacer and rinse mouth afterwards. 1 Inhaler 5  . fluticasone (FLONASE) 50 MCG/ACT nasal spray Place 2 sprays into both nostrils daily. 16 g 6  . levonorgestrel (MIRENA, 52 MG,) 20 MCG/24HR IUD Mirena 20 mcg/24 hours (6 yrs) 52 mg intrauterine device  Take 1 device by intrauterine route.    . liraglutide (VICTOZA) 18 MG/3ML SOPN Inject 0.6 mg into the skin daily. Inject 0.6mg  under the skin once daily. 3 mL 0  . montelukast (SINGULAIR) 10 MG tablet Take 1 tablet (10 mg total) by mouth at bedtime. 30 tablet 5  . VENTOLIN HFA 108 (90 Base) MCG/ACT inhaler Inhale 2 puffs into the lungs every 6 (six) hours as needed for wheezing or  shortness of breath. 18 g 1  . Vitamin D, Ergocalciferol, (DRISDOL) 1.25 MG (50000 UNIT) CAPS capsule Take 1 capsule (50,000 Units total) by mouth every 7 (seven) days. 4 capsule 0  . albuterol (PROAIR HFA) 108 (90 Base) MCG/ACT inhaler Inhale 2 puffs into the lungs every 4 (four) hours as needed for wheezing or shortness of breath. 18 g 0  . NIFEdipine (ADALAT CC) 60 MG 24 hr tablet Take 60 mg by mouth every morning. (Patient not taking: Reported on 03/03/2020)     No current facility-administered medications for this visit.   Allergies: Allergies  Allergen Reactions  . Cats Claw (Uncaria Tomentosa) Itching, Shortness Of Breath and Swelling  . Dust Mite Extract Itching and Shortness Of Breath  . Grass Pollen(K-O-R-T-Swt Vern) Itching and Shortness Of Breath  . Mixed Feathers Itching, Shortness Of Breath and Swelling   I reviewed her past medical history, social history, family history, and environmental history and no significant changes have been reported from her previous visit.  Review of Systems  Constitutional: Negative for appetite change, chills, fever and unexpected weight change.  HENT: Negative for congestion, postnasal drip, rhinorrhea and sneezing.   Eyes: Negative for itching.  Respiratory: Positive for cough, chest tightness, shortness of breath and wheezing.   Cardiovascular: Negative for chest pain.  Gastrointestinal: Negative for abdominal pain.  Genitourinary: Negative for difficulty urinating.  Skin: Negative for rash.  Allergic/Immunologic: Positive for environmental allergies. Negative for food allergies.  Neurological: Negative  for headaches.   Objective: BP (!) 158/85   Pulse 96   Temp (!) 97.1 F (36.2 C) (Temporal)   Resp 16   SpO2 97%  There is no height or weight on file to calculate BMI. Physical Exam Vitals and nursing note reviewed.  Constitutional:      Appearance: Normal appearance. She is well-developed. She is obese.  HENT:     Head:  Normocephalic and atraumatic.     Right Ear: Tympanic membrane and external ear normal.     Left Ear: Tympanic membrane and external ear normal.     Nose: Nose normal.     Mouth/Throat:     Mouth: Mucous membranes are moist.     Pharynx: Oropharynx is clear.  Eyes:     Conjunctiva/sclera: Conjunctivae normal.  Cardiovascular:     Rate and Rhythm: Normal rate and regular rhythm.     Heart sounds: Normal heart sounds. No murmur heard.  No friction rub. No gallop.   Pulmonary:     Effort: Pulmonary effort is normal.     Breath sounds: Wheezing present. No rhonchi or rales.  Musculoskeletal:     Cervical back: Neck supple.  Skin:    General: Skin is warm.     Findings: No rash.  Neurological:     Mental Status: She is alert and oriented to person, place, and time.  Psychiatric:        Behavior: Behavior normal.    Previous notes and tests were reviewed. The plan was reviewed with the patient/family, and all questions/concerned were addressed.  It was my pleasure to see Cassandra Tucker today and participate in her care. Please feel free to contact me with any questions or concerns.  Sincerely,  Wyline Mood, DO Allergy & Immunology  [facility] office: [phone] [facility] office: [phone]

## 2020-03-25 ENCOUNTER — Encounter: Payer: Self-pay | Admitting: Allergy

## 2020-03-25 ENCOUNTER — Ambulatory Visit (INDEPENDENT_AMBULATORY_CARE_PROVIDER_SITE_OTHER): Payer: Medicaid Other | Admitting: Allergy

## 2020-03-25 VITALS — BP 158/85 | HR 96 | Temp 97.1°F | Resp 16

## 2020-03-25 DIAGNOSIS — J3089 Other allergic rhinitis: Secondary | ICD-10-CM

## 2020-03-25 DIAGNOSIS — H1010 Acute atopic conjunctivitis, unspecified eye: Secondary | ICD-10-CM

## 2020-03-25 DIAGNOSIS — J4550 Severe persistent asthma, uncomplicated: Secondary | ICD-10-CM

## 2020-03-25 DIAGNOSIS — J4551 Severe persistent asthma with (acute) exacerbation: Secondary | ICD-10-CM | POA: Insufficient documentation

## 2020-03-25 DIAGNOSIS — J302 Other seasonal allergic rhinitis: Secondary | ICD-10-CM

## 2020-03-25 MED ORDER — METHYLPREDNISOLONE ACETATE 80 MG/ML IJ SUSP
80.0000 mg | Freq: Once | INTRAMUSCULAR | Status: AC
  Administered 2020-03-25: 80 mg via INTRAMUSCULAR

## 2020-03-25 MED ORDER — BENRALIZUMAB 30 MG/ML ~~LOC~~ SOSY
30.0000 mg | PREFILLED_SYRINGE | Freq: Once | SUBCUTANEOUS | Status: AC
Start: 2020-03-25 — End: 2020-03-25
  Administered 2020-03-25: 30 mg via SUBCUTANEOUS

## 2020-03-25 NOTE — Patient Instructions (Addendum)
Environmental allergies  Past skin testing showed: Positive to grass, dust mites, cats, feathers.  Continue environmental control measures as below.  May use over the counter antihistamines such as Zyrtec (cetirizine), Claritin (loratadine), Allegra (fexofenadine), or Xyzal (levocetirizine) daily. May take it twice a day if needed.   Use Flonase 1 spray per nostril twice a day for nasal congestion.  Use azelastine 1-2 spray per nostril twice a day for runny nose.   Asthma: . Continue Fasenra injections every 4 weeks x 2 then every 8 weeks - injection given today.  . Steroid injection given today.  Start Alvesco 1 puffs twice a day with spacer and rinse mouth afterwards for 1-2 weeks until your breathing symptoms return to baseline. Sample given.  . Daily controller medication(s): TAKE Symbicort 2 puffs twice a day with spacer and rinse mouth afterwards. . Continue Spiriva 1.36mcg 2 puffs once a day. Continue Singulair (montelukast) 10mg  daily at night. May use albuterol rescue inhaler 2 puffs or nebulizer every 4 to 6 hours as needed for shortness of breath, chest tightness, coughing, and wheezing. May use albuterol rescue inhaler 2 puffs 5 to 15 minutes prior to strenuous physical activities. Monitor frequency of use.  During upper respiratory infections/asthma flares: start Alvesco 1 puffs twice a day with spacer and rinse mouth afterwards for 1-2 weeks until your breathing symptoms return to baseline.  Asthma control goals:  Full participation in all desired activities (may need albuterol before activity) Albuterol use two times or less a week on average (not counting use with activity) Cough interfering with sleep two times or less a month Oral steroids no more than once a year No hospitalizations   Try to walk at least 5000 steps every day.   Recommend getting the COVID-19 vaccine.  Here's more information:  Follow up in  2 months or sooner if needed.   Reducing Pollen Exposure . Pollen seasons: trees (spring), grass (summer) and ragweed/weeds (fall). 10-19-1979 Keep windows closed in your home and car to lower pollen exposure.  Marland Kitchen air conditioning in the bedroom and throughout the house if possible.  . Avoid going out in dry windy days - especially early morning. . Pollen counts are highest between 5 - 10 AM and on dry, hot and windy days.  . Save outside activities for late afternoon or after a heavy rain, when pollen levels are lower.  . Avoid mowing of grass if you have grass pollen allergy. [name] Be aware that pollen can also be transported indoors on people and pets.  . Dry your clothes in an automatic dryer rather than hanging them outside where they might collect pollen.  . Rinse hair and eyes before bedtime. Control of House Dust Mite Allergen . Dust mite allergens are a common trigger of allergy and asthma symptoms. While they can be found throughout the house, these microscopic creatures thrive in warm, humid environments such as bedding, upholstered furniture and carpeting. . Because so much time is spent in the bedroom, it is essential to reduce mite levels there.  . Encase pillows, mattresses, and box springs in special allergen-proof fabric covers or airtight, zippered plastic covers.  . Bedding should be washed weekly in hot water (130 F) and dried in a hot dryer. Allergen-proof covers are available for comforters and pillows that can't be regularly washed.  Marland Kitchen the allergy-proof covers every few months. Minimize clutter in the bedroom. Keep pets out of the bedroom.  [name] Keep humidity less  than 50% by using a dehumidifier or air conditioning. You can buy a humidity measuring device called a hygrometer to monitor this.  . If possible, replace carpets with hardwood, linoleum, or washable area rugs. If that's not possible, vacuum frequently with a vacuum that has a HEPA filter. . Remove all upholstered  furniture and non-washable window drapes from the bedroom. . Remove all non-washable stuffed toys from the bedroom.  Wash stuffed toys weekly. Pet Allergen Avoidance: . Contrary to popular opinion, there are no "hypoallergenic" breeds of dogs or cats. That is because people are not allergic to an animal's hair, but to an allergen found in the animal's saliva, dander (dead skin flakes) or urine. Pet allergy symptoms typically occur within minutes. For some people, symptoms can build up and become most severe 8 to 12 hours after contact with the animal. People with severe allergies can experience reactions in public places if dander has been transported on the pet owners' clothing. Marland Kitchen Keeping an animal outdoors is only a partial solution, since homes with pets in the yard still have higher concentrations of animal allergens. . Before getting a pet, ask your allergist to determine if you are allergic to animals. If your pet is already considered part of your family, try to minimize contact and keep the pet out of the bedroom and other rooms where you spend a great deal of time. . As with dust mites, vacuum carpets often or replace carpet with a hardwood floor, tile or linoleum. . High-efficiency particulate air (HEPA) cleaners can reduce allergen levels over time. . While dander and saliva are the source of cat and dog allergens, urine is the source of allergens from rabbits, hamsters, mice and [place] pigs; so ask a non-allergic family member to clean the animal's cage. . If you have a pet allergy, talk to your allergist about the potential for allergy immunotherapy (allergy shots). This strategy can often provide long-term relief.

## 2020-03-25 NOTE — Assessment & Plan Note (Signed)
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 No previous ENT evaluation. Tried Claritin, Flonase and Zyrtec with some benefit. 2021 skin testing showed: Positive to grass, dust mites, cats, feathers. Negative to common foods.  Interim history - stable with below regimen.  Continue environmental control measures as below.  May use over the counter antihistamines such as Zyrtec (cetirizine), Claritin (loratadine), Allegra (fexofenadine), or Xyzal (levocetirizine) daily. May take it twice a day if needed.   Use Flonase 1 spray per nostril twice a day for nasal congestion.  Use azelastine 1-2 spray per nostril twice a day for runny nose.   If above regimen does not control symptoms then will discuss starting allergy immunotherapy once asthma is more stable.

## 2020-03-25 NOTE — Assessment & Plan Note (Addendum)
Past history - Symptoms of chest tightness, shortness of breath, coughing, wheezing, nocturnal awakenings for 1 year. History of reflux but no longer on PPI. Ex-smoker. 2021 spirometry showed: normal pattern and 13% improvement in FEV1 post bronchodilator treatment. Clinically feeling better. Interim history - current flare going on for the past 1-2 weeks. Using albuterol 2 puffs every hour with good benefit. Did not start Harrington Challenger yet. No additional prednisone since last visit. Using Symbicort once a day instead of twice a day.  . Today's spirometry showed: possible restrictive disease with 13% improvement in FEV1 post bronchodilator treatment. Clinically feeling better but still wheezing on exam.  . Continue Fasenra injections every 4 weeks x 2 then every 8 weeks - injection given today.  . Depo 80mg  given today.  Start Alvesco 1 puffs twice a day with spacer and rinse mouth afterwards for 1-2 weeks until your breathing symptoms return to baseline. Sample given.  . Daily controller medication(s): TAKE Symbicort 2 puffs twice a day with spacer and rinse mouth afterwards. . Continue Spiriva 1.38mcg 2 puffs once a day. Continue Singulair (montelukast) 10mg  daily at night. May use albuterol rescue inhaler 2 puffs or nebulizer every 4 to 6 hours as needed for shortness of breath, chest tightness, coughing, and wheezing. May use albuterol rescue inhaler 2 puffs 5 to 15 minutes prior to strenuous physical activities. Monitor frequency of use.  During upper respiratory infections/asthma flares: start Alvesco 32m 1 puffs twice a day with spacer and rinse mouth afterwards for 1-2 weeks until your breathing symptoms return to baseline.  Discussed that some of her exertion related breathing issues most likely due to physical deconditioning. Discussed and encouraged exercise/weight loss.  Repeat spirometry at next visit. Get sleep study done - see PCP for this.  Recommend getting COVID-19  vaccination.

## 2020-03-29 ENCOUNTER — Encounter (INDEPENDENT_AMBULATORY_CARE_PROVIDER_SITE_OTHER): Payer: Self-pay | Admitting: Family Medicine

## 2020-03-30 ENCOUNTER — Encounter (INDEPENDENT_AMBULATORY_CARE_PROVIDER_SITE_OTHER): Payer: Self-pay | Admitting: Family Medicine

## 2020-03-30 ENCOUNTER — Telehealth (INDEPENDENT_AMBULATORY_CARE_PROVIDER_SITE_OTHER): Payer: Self-pay

## 2020-03-30 NOTE — Telephone Encounter (Signed)
Pt called regarding the status of her VICTOZA, patient uses [facility] [address], please give patient a call.

## 2020-03-30 NOTE — Telephone Encounter (Signed)
PA has been initiated via [email or web address] for Victoza 18mg /21mL.  Reka Pettry (Key: 1m) Rx #: [place] Victoza 18MG [id number] pen-injectors   Form: [employer] Electronic Ronny Bacon Form [phone] NCPDP)   Determination: Wait for Determination  Please wait for [employer] [facility] to return a determination.

## 2020-03-31 ENCOUNTER — Encounter (INDEPENDENT_AMBULATORY_CARE_PROVIDER_SITE_OTHER): Payer: Self-pay | Admitting: Family Medicine

## 2020-03-31 NOTE — Telephone Encounter (Signed)
PA for Victoza has been approved. Fax from insurance reads  "Victoza 2-pak 18mg /52mL pen: quantity/billable units of 6 approved for 03/30/20-03/30/21 Pt notified of approval via mychart.

## 2020-04-01 ENCOUNTER — Other Ambulatory Visit: Payer: Self-pay

## 2020-04-01 ENCOUNTER — Ambulatory Visit (INDEPENDENT_AMBULATORY_CARE_PROVIDER_SITE_OTHER): Payer: Medicaid Other

## 2020-04-01 ENCOUNTER — Other Ambulatory Visit (INDEPENDENT_AMBULATORY_CARE_PROVIDER_SITE_OTHER): Payer: Self-pay

## 2020-04-01 ENCOUNTER — Ambulatory Visit
Admission: EM | Admit: 2020-04-01 | Discharge: 2020-04-01 | Disposition: A | Discharge status: Home / Self Care | Payer: Medicaid Other | Attending: Physician Assistant | Admitting: Physician Assistant

## 2020-04-01 ENCOUNTER — Encounter (INDEPENDENT_AMBULATORY_CARE_PROVIDER_SITE_OTHER): Payer: Self-pay | Admitting: Family Medicine

## 2020-04-01 DIAGNOSIS — S93401A Sprain of unspecified ligament of right ankle, initial encounter: Secondary | ICD-10-CM | POA: Diagnosis not present

## 2020-04-01 DIAGNOSIS — M79672 Pain in left foot: Secondary | ICD-10-CM

## 2020-04-01 DIAGNOSIS — M7989 Other specified soft tissue disorders: Secondary | ICD-10-CM | POA: Diagnosis not present

## 2020-04-01 DIAGNOSIS — M25571 Pain in right ankle and joints of right foot: Secondary | ICD-10-CM | POA: Diagnosis not present

## 2020-04-01 MED ORDER — INSULIN PEN NEEDLE 31G X 5 MM MISC: 1.0000 | Freq: Every day | 0 refills | Status: DC

## 2020-04-01 NOTE — Discharge Instructions (Signed)
Xray negative for fracture or dislocation. Ace wrap as needed. Ibuprofen 800mg  three times a day. Ice compress, rest. This may take a few weeks to completely resolve, but should be feeling better each week. Follow up with PCP if symptoms not improving.

## 2020-04-01 NOTE — Telephone Encounter (Signed)
I like the nanoneedles

## 2020-04-01 NOTE — ED Provider Notes (Signed)
[name]    [name]: [id number] Arrival date & time: 04/01/20  1539      History   Chief Complaint Chief Complaint  Patient presents with   Ankle Pain    HPI Cassandra Tucker is a 29 y.o. female.   29 year old female comes in for right ankle pain, left foot pain after fall today. She stepped off a curve wrong, inverting her ankle and falling to her knees. Has been able to ambulate and work since, but due to worsening pain, came in for evaluation.      Past Medical History:  Diagnosis Date   Ankle fracture, left 2000   Anxiety    Arthritis    left wrist and left ankle   Asthma    Back pain    Back pain    Bronchitis    Depression    Diabetes (HCC) 01/11/2016   type II  history of   Family history of adverse reaction to anesthesia    gmother had problems with N/V    GERD (gastroesophageal reflux disease)    Headache    High cholesterol    History of kidney stones    HLD (hyperlipidemia)    HSV-2 infection    Hypertension 2013   Intracranial hypertension    psuedotumor cebrum   Joint pain    Joint pain    Kienbock's disease    Leg edema    OSA (obstructive sleep apnea)    cpap - does not know settings    Papilledema    Prediabetes    Pregnancy induced hypertension    Pseudotumor cerebri syndrome    RLS (restless legs syndrome)    Sinus tachycardia    SOB (shortness of breath)     Patient Active Problem List   Diagnosis Date Noted   Severe persistent asthma with (acute) exacerbation 03/25/2020   Hyperlipidemia, mixed 03/03/2020   Not well controlled severe persistent asthma 02/19/2020   Vaccine counseling 02/19/2020   Seasonal and perennial allergic rhinoconjunctivitis 01/01/2020   Postpartum anemia 07/20/2019   Postpartum care following cesarean delivery 2/4 07/18/2019   Cesarean delivery -  IOL with fetal intolerance of labor and FTP 07/18/2019   Marginal insertion of umbilical cord affecting  management of mother 07/16/2019   Chronic hypertension affecting pregnancy 07/01/2019   Pregnancy 06/29/2019   BMI 45.0-49.9, adult (HCC) 06/29/2019   Pregnancy-induced hypertension 06/26/2019   Anemia 05/21/2019   Asthma, not well controlled, unspecified asthma severity, uncomplicated 05/18/2019   Maternal obesity affecting pregnancy, antepartum 01/28/2019   History of depression 01/28/2019   Anxiety 01/26/2019   History of bariatric surgery 01/13/2019   Genital herpes simplex 01/13/2019   Pregnant 12/25/2018   Vitamin D deficiency 02/26/2017   Pre-diabetes 02/26/2017   Class 3 obesity with serious comorbidity and body mass index (BMI) of 60.0 to 69.9 in adult 02/26/2017   Controlled type 2 diabetes mellitus without complication, without long-term current use of insulin (HCC) 04/21/2016   Gastroesophageal reflux disease without esophagitis 04/21/2016   Pseudotumor cerebri syndrome 11/29/2015   Super obesity 11/29/2015   OSA on CPAP 11/29/2015   RLS (restless legs syndrome) 11/29/2015   Papilledema 06/16/2015   Pain in the wrist 04/28/2014   Kienbock disease, 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   Elevated cholesterol 01/21/2014   Sinus tachycardia 12/04/2011   Essential hypertension 12/04/2011   Hypertension 12/04/2011   Morbid obesity (HCC) 11/07/2011    Past Surgical History:  Procedure Laterality Date   CESAREAN SECTION N/A  07/17/2019   Procedure: CESAREAN SECTION;  Surgeon: Osborn Coho, MD;  Location: [facility] LD ORS;  Service: Obstetrics;  Laterality: N/A;   FRACTURE SURGERY  2005   ankle   FRACTURE SURGERY  2013   wrist (deteriorating bone)   LAPAROSCOPIC GASTRIC SLEEVE RESECTION N/A 05/08/2017   Procedure: LAPAROSCOPIC GASTRIC SLEEVE RESECTION;  Surgeon: Berna Bue, MD;  Location: [facility] ORS;  Service: General;  Laterality: N/A;   LAPAROSCOPIC GASTRIC SLEEVE RESECTION  05/08/2017   UPPER GI ENDOSCOPY  05/08/2017   Procedure: UPPER GI ENDOSCOPY;   Surgeon: Berna Bue, MD;  Location: [facility] ORS;  Service: General;;   urethra stretch     WRIST SURGERY  2015    OB History    Gravida  1   Para  1   Term  1   Preterm  0   AB  0   Living  1     SAB  0   TAB  0   Ectopic  0   Multiple  0   Live Births  1            Home Medications    Prior to Admission medications   Medication Sig Start Date End Date Taking? Authorizing Provider  albuterol (PROAIR HFA) 108 (90 Base) MCG/ACT inhaler Inhale 2 puffs into the lungs every 4 (four) hours as needed for wheezing or shortness of breath. 01/08/20   Ellamae Sia, DO  albuterol (PROVENTIL) (2.5 MG/3ML) 0.083% nebulizer solution Take 3 mLs (2.5 mg total) by nebulization every 4 (four) hours as needed for wheezing or shortness of breath. 02/19/20   Ellamae Sia, DO  budesonide-formoterol (SYMBICORT) 160-4.5 MCG/ACT inhaler Inhale 2 puffs into the lungs in the morning and at bedtime. with spacer and rinse mouth afterwards. 01/08/20   Ellamae Sia, DO  fluticasone (FLONASE) 50 MCG/ACT nasal spray Place 2 sprays into both nostrils daily. 10/03/19   Daphine Deutscher, Mary-Margaret, FNP  Insulin Pen Needle 31G X 5 MM MISC 1 each by Does not apply route daily. Use one pen needle daily to inject Victoza. 04/01/20   Quillian Quince D, MD  levonorgestrel (MIRENA, 52 MG,) 20 MCG/24HR IUD Mirena 20 mcg/24 hours (6 yrs) 52 mg intrauterine device  Take 1 device by intrauterine route.    [provider]  liraglutide (VICTOZA) 18 MG/3ML SOPN Inject 0.6 mg into the skin daily. Inject 0.6mg  under the skin once daily. 03/24/20   Quillian Quince D, MD  montelukast (SINGULAIR) 10 MG tablet Take 1 tablet (10 mg total) by mouth at bedtime. 02/05/20   Ellamae Sia, DO  NIFEdipine (ADALAT CC) 60 MG 24 hr tablet Take 60 mg by mouth every morning. Patient not taking: Reported on 03/03/2020    [provider]  VENTOLIN HFA 108 (90 Base) MCG/ACT inhaler Inhale 2 puffs into the lungs every 6 (six) hours as  needed for wheezing or shortness of breath. 03/11/20   Ellamae Sia, DO  Vitamin D, Ergocalciferol, (DRISDOL) 1.25 MG (50000 UNIT) CAPS capsule Take 1 capsule (50,000 Units total) by mouth every 7 (seven) days. 03/04/20   Quillian Quince D, MD  cetirizine (ZYRTEC) 10 MG tablet cetirizine 10 mg tablet  TAKE 1 TABLET BY MOUTH EVERY DAY  12/15/19  [provider]    Family History Family History  Problem Relation Age of Onset   Heart attack Mother    Hypertension Mother    Obesity Mother    Depression Mother  Anxiety disorder Mother    Bipolar disorder Mother    Allergic rhinitis Mother    Sleep apnea Mother    Cancer Father    Allergic rhinitis Father    Depression Father    Alcoholism Father    Allergic rhinitis Brother    Allergic rhinitis Maternal Grandmother    Migraines Neg Hx    Asthma Neg Hx    Eczema Neg Hx    Urticaria Neg Hx     Social History Social History   Tobacco Use   Smoking status: Former Smoker    Packs/day: 1.00    Types: Cigarettes    Quit date: 10/05/2018    Years since quitting: 1.4   Smokeless tobacco: Never Used  Vaping Use   Vaping Use: Never used  Substance Use Topics   Alcohol use: Yes    Alcohol/week: 0.0 standard drinks    Comment: socially   Drug use: Yes    Types: Marijuana    Comment: early in pregnancy for nausea     Allergies   Cats claw (uncaria tomentosa), Dust mite extract, Grass pollen(k-o-r-t-swt vern), and Mixed feathers   Review of Systems Review of Systems  Reason unable to perform ROS: See HPI as above.     Physical Exam Triage Vital Signs ED Triage Vitals [04/01/20 1625]  Enc Vitals Group     BP (!) 167/108     Pulse Rate (!) 106     Resp 18     Temp 98.1 F (36.7 C)     Temp Source Oral     SpO2 98 %     Weight      Height      Head Circumference      Peak Flow      Pain Score 8     Pain Loc      Pain Edu?      Excl. in GC?    No data found.  Updated Vital  Signs BP (!) 167/108 (BP Location: Left Arm)    Pulse (!) 106    Temp 98.1 F (36.7 C) (Oral)    Resp 18    SpO2 98%    Breastfeeding No   Physical Exam Constitutional:      General: She is not in acute distress.    Appearance: Normal appearance. She is well-developed. She is not toxic-appearing or diaphoretic.  HENT:     Head: Normocephalic and atraumatic.  Eyes:     Conjunctiva/sclera: Conjunctivae normal.     Pupils: Pupils are equal, round, and reactive to light.  Pulmonary:     Effort: Pulmonary effort is normal. No respiratory distress.  Musculoskeletal:     Cervical back: Normal range of motion and neck supple.     Comments: RLE: No obvious swelling, erythema, warmth, contusion. Tenderness to palpation along right lateral malleolus. No tenderness to palpation of the foot. NVI  LLE: no obvious swelling, erythema, warmth, contusion. Mild tenderness along proximal 1st MTP. Full ROM of ankle, toes. NVI  Skin:    General: Skin is warm and dry.  Neurological:     Mental Status: She is alert and oriented to person, place, and time.      UC Treatments / Results  Labs (all labs ordered are listed, but only abnormal results are displayed) Labs Reviewed - No data to display  EKG   Radiology DG Ankle Complete Right  Result Date: 04/01/2020 CLINICAL DATA:  Pain following inversion injury EXAM: RIGHT ANKLE - COMPLETE  3+ VIEW COMPARISON:  None. FINDINGS: Frontal, oblique, and lateral views were obtained. There is generalized soft tissue swelling. There is no appreciable fracture or joint effusion. No appreciable joint space narrowing or erosion. Ankle mortise appears intact. There are apparent phleboliths in the soft tissues. IMPRESSION: Diffuse soft tissue swelling. No evident fracture or appreciable arthropathic change. Ankle mortise appears intact. Electronically Signed   By: Bretta Bang III M.D.   On: 04/01/2020 16:42    Procedures Procedures (including critical care  time)  Medications Ordered in UC Medications - No data to display  Initial Impression / Assessment and Plan / UC Course  I have reviewed the triage vital signs and the nursing notes.  Pertinent labs & imaging results that were available during my care of the patient were reviewed by me and considered in my medical decision making (see chart for details).    Xray negative for fracture or dislocation. Symptomatic treatment discussed. Expected course of healing discussed. Return precautions given,  Final Clinical Impressions(s) / UC Diagnoses   Final diagnoses:  Acute right ankle pain  Left foot pain    ED Prescriptions    None     PDMP not reviewed this encounter.   Belinda Fisher, PA-C 04/01/20 2317

## 2020-04-01 NOTE — ED Triage Notes (Signed)
Pt states stepped off curb wrong twisting rt ankle and falling to knees. Pt c/o pain to rt ankle and the top of lt foot.

## 2020-04-05 NOTE — Progress Notes (Signed)
Chief Complaint:   OBESITY Cassandra Tucker is here to discuss her progress with her obesity treatment plan along with follow-up of her obesity related diagnoses. Cassandra Tucker is on keeping a food journal and adhering to recommended goals of 1500-1700 calories and 100+ grams of protein daily and states she is following her eating plan approximately 50% of the time. Cassandra Tucker states she is walking for 30-45 minutes 5 times per week.  Today's visit was #: 3 Starting weight: 316 lbs  Starting date: 02/18/2020 Today's weight: 319 lbs Today's date: 03/24/2020 Total lbs lost to date: 0 Total lbs lost since last in-office visit: 1  Interim History: Cassandra Tucker is struggling to follow her Category 4 plan closely. She notes significant issues with both hunger and cravings. She is interested in learning more about revision surgery for weight loss.  Subjective:   1. Type 2 diabetes mellitus with other specified complication, unspecified whether long term insulin use (HCC) Cassandra Tucker is not on medications, and she notes significant polyphagia. She has been on Victoza in the past and did well on it.  2. Essential hypertension Cassandra Tucker's blood pressure is elevated today. She is on Nifedipine and is working on diet and weight loss. She denies chest pain or headache.  Assessment/Plan:   1. Type 2 diabetes mellitus with other specified complication, unspecified whether long term insulin use (HCC) Good blood sugar control is important to decrease the likelihood of diabetic complications such as nephropathy, neuropathy, limb loss, blindness, coronary artery disease, and death. Intensive lifestyle modification including diet, exercise and weight loss are the first line of treatment for diabetes. Cassandra Tucker agreed to start Victoza 0.6 mg q daily with no refills, and pen needles #100 with no refills. She will continue with diet, exercise, and weight loss.  2. Essential hypertension Cassandra Tucker is working on healthy weight  loss, diet, and exercise to improve blood pressure control. She will be starting a GLP-1, which may also lower her blood pressure. We will watch for signs of hypotension as she continues her lifestyle modifications.  3. Class 3 severe obesity with serious comorbidity and body mass index (BMI) of 50.0 to 59.9 in adult, unspecified obesity type (HCC) Cassandra Tucker is currently in the action stage of change. As such, her goal is to continue with weight loss efforts. She has agreed to the Category 4 Plan.   We will refer to [facility] for weight loss surgery revision options.   Exercise goals: As is.  Behavioral modification strategies: increasing lean protein intake.  Cassandra Tucker has agreed to follow-up with our clinic in 3 weeks. She was informed of the importance of frequent follow-up visits to maximize her success with intensive lifestyle modifications for her multiple health conditions.   Objective:   Blood pressure (!) 152/88, pulse (!) 108, temperature 98.2 F (36.8 C), height 5\' 5"  (1.651 m), weight (!) 319 lb (144.7 kg), SpO2 97 %, not currently breastfeeding. Body mass index is 53.08 kg/m.  General: Cooperative, alert, well developed, in no acute distress. HEENT: Conjunctivae and lids unremarkable. Cardiovascular: Regular rhythm.  Lungs: Normal work of breathing. Neurologic: No focal deficits.   Lab Results  Component Value Date   CREATININE 0.60 02/18/2020   BUN 10 02/18/2020   NA 140 02/18/2020   K 4.7 02/18/2020   CL 104 02/18/2020   CO2 25 02/18/2020   Lab Results  Component Value Date   ALT 8 02/18/2020   AST 10 02/18/2020   ALKPHOS 67 02/18/2020   BILITOT 0.7 02/18/2020  Lab Results  Component Value Date   HGBA1C 5.8 (H) 02/18/2020   HGBA1C 4.8 08/10/2017   HGBA1C 5.6 05/01/2017   HGBA1C 6.0 (H) 02/14/2017   HGBA1C 6.9 11/14/2016   Lab Results  Component Value Date   INSULIN 20.9 02/18/2020   INSULIN 60.5 (H) 02/14/2017   Lab Results    Component Value Date   TSH 1.820 02/18/2020   Lab Results  Component Value Date   CHOL 214 (H) 02/18/2020   HDL 38 (L) 02/18/2020   LDLCALC 150 (H) 02/18/2020   TRIG 142 02/18/2020   CHOLHDL 5.3 (H) 11/14/2016   Lab Results  Component Value Date   WBC 10.3 02/18/2020   HGB 12.2 02/18/2020   HCT 37.7 02/18/2020   MCV 76 (L) 02/18/2020   PLT 282 02/18/2020   Lab Results  Component Value Date   FERRITIN 45 02/07/2016   Attestation Statements:   Reviewed by clinician on day of visit: allergies, medications, problem list, medical history, surgical history, family history, social history, and previous encounter notes.   I, Burt Knack, am acting as transcriptionist for Quillian Quince, MD.  I have reviewed the above documentation for accuracy and completeness, and I agree with the above. -  Quillian Quince, MD

## 2020-04-08 ENCOUNTER — Telehealth: Payer: Self-pay | Admitting: Allergy

## 2020-04-08 NOTE — Telephone Encounter (Signed)
Patient called about her injection that she gets. She wants to refill it, but doesn't know who to call.

## 2020-04-09 ENCOUNTER — Other Ambulatory Visit: Payer: Self-pay

## 2020-04-09 ENCOUNTER — Encounter: Payer: Self-pay | Admitting: Podiatry

## 2020-04-09 ENCOUNTER — Other Ambulatory Visit: Payer: Self-pay | Admitting: Podiatry

## 2020-04-09 ENCOUNTER — Ambulatory Visit: Payer: Medicaid Other | Admitting: Podiatry

## 2020-04-09 ENCOUNTER — Telehealth: Payer: Self-pay | Admitting: Podiatry

## 2020-04-09 DIAGNOSIS — S93401A Sprain of unspecified ligament of right ankle, initial encounter: Secondary | ICD-10-CM

## 2020-04-09 DIAGNOSIS — M7751 Other enthesopathy of right foot: Secondary | ICD-10-CM

## 2020-04-09 MED ORDER — TRAMADOL HCL 50 MG PO TABS
50.0000 mg | ORAL_TABLET | ORAL | 0 refills | Status: AC | PRN
Start: 2020-04-09 — End: 2020-04-14

## 2020-04-09 MED ORDER — MELOXICAM 15 MG PO TABS: 15.0000 mg | ORAL_TABLET | Freq: Every day | ORAL | 1 refills | Status: DC

## 2020-04-09 NOTE — Telephone Encounter (Signed)
Tramadol sent. 

## 2020-04-09 NOTE — Progress Notes (Signed)
HPI: 29 y.o. female presenting today as a new patient for evaluation of her right ankle sprain that occurred on 04/01/2020.  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  She went to the urgent care on 04/01/2020 where x-rays were taken negative for fracture.  She basically has not done anything for treatment other than the urgent care.  No prescriptions were provided and she is not wearing a cam boot today.  She presents today for further treatment and evaluation  Past Medical History:  Diagnosis Date  . Ankle fracture, left 2000  . Anxiety   . Arthritis    left wrist and left ankle  . Asthma   . Back pain   . Back pain   . Bronchitis   . Depression   . Diabetes (HCC) 01/11/2016   type II  history of  . Family history of adverse reaction to anesthesia    gmother had problems with N/V   . GERD (gastroesophageal reflux disease)   . Headache   . High cholesterol   . History of kidney stones   . HLD (hyperlipidemia)   . HSV-2 infection   . Hypertension 2013  . Intracranial hypertension    psuedotumor cebrum  . Joint pain   . Joint pain   . Kienbock's disease   . Leg edema   . OSA (obstructive sleep apnea)    cpap - does not know settings   . Papilledema   . Prediabetes   . Pregnancy induced hypertension   . Pseudotumor cerebri syndrome   . RLS (restless legs syndrome)   . Sinus tachycardia   . SOB (shortness of breath)      Physical Exam: General: The patient is alert and oriented x3 in no acute distress.  Dermatology: Skin is warm, dry and supple bilateral lower extremities. Negative for open lesions or macerations.  Vascular: Palpable pedal pulses bilaterally.  Moderate edema with some ecchymosis noted to the lateral aspect of the right ankle joint.. Capillary refill within normal limits.  Neurological: Epicritic and protective threshold grossly intact bilaterally.   Musculoskeletal Exam: Range of  motion within normal limits to all pedal and ankle joints bilateral. Muscle strength 5/5 in all groups bilateral.  Ecchymosis with edema and pain on palpation along the lateral aspect of the right ankle more posterior.  Assessment: 1.  Ankle sprain right.  DOI: 04/01/2020  Plan of Care:  1. Patient evaluated. X-Rays reviewed that were taken in the urgent care.  2.  Injection of 0.5 cc Celestone Soluspan injected into the posterior aspect of the left ankle joint. 3.  Prescription for meloxicam 15 mg daily 4.  Ace wrap provided.  Wear daily 5.  Cam boot dispensed.  Weightbearing as tolerated 6.  Return to clinic in 3 weeks for follow-up.  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       Felecia Shelling, DPM [facility]  Dr. Felecia Shelling, DPM    [address], [place] [address]  Office [phone]  Fax [phone]

## 2020-04-09 NOTE — Telephone Encounter (Signed)
Called and spoke with the patient and it was in regards to her Fasenra injection. She received her first injection in office on 03/25/20 and is due 04/22/2020 for her next injection. She stated that she was having issues ordering her medication. Please advise.

## 2020-04-09 NOTE — Telephone Encounter (Signed)
Patient called in requesting pain medication to help with pain and discomfort, please advice

## 2020-04-09 NOTE — Progress Notes (Signed)
PRN pain 

## 2020-04-12 NOTE — Telephone Encounter (Signed)
Called and gave patient number to [facility] pharmacy and advised if she has any issues let me know

## 2020-04-14 ENCOUNTER — Ambulatory Visit (INDEPENDENT_AMBULATORY_CARE_PROVIDER_SITE_OTHER): Payer: Medicaid Other | Admitting: Family Medicine

## 2020-04-14 ENCOUNTER — Other Ambulatory Visit: Payer: Self-pay

## 2020-04-14 ENCOUNTER — Encounter (INDEPENDENT_AMBULATORY_CARE_PROVIDER_SITE_OTHER): Payer: Self-pay | Admitting: Family Medicine

## 2020-04-14 VITALS — BP 162/82 | HR 87 | Temp 98.0°F | Ht 65.0 in | Wt 322.0 lb

## 2020-04-14 DIAGNOSIS — F419 Anxiety disorder, unspecified: Secondary | ICD-10-CM | POA: Diagnosis not present

## 2020-04-14 DIAGNOSIS — E1169 Type 2 diabetes mellitus with other specified complication: Secondary | ICD-10-CM

## 2020-04-14 DIAGNOSIS — I10 Essential (primary) hypertension: Secondary | ICD-10-CM

## 2020-04-14 DIAGNOSIS — Z6841 Body Mass Index (BMI) 40.0 and over, adult: Secondary | ICD-10-CM | POA: Diagnosis not present

## 2020-04-14 MED ORDER — CHLORTHALIDONE 25 MG PO TABS: 25.0000 mg | ORAL_TABLET | Freq: Every day | ORAL | 0 refills | Status: DC

## 2020-04-14 MED ORDER — ESCITALOPRAM OXALATE 10 MG PO TABS: 10.0000 mg | ORAL_TABLET | Freq: Every day | ORAL | 0 refills | Status: DC

## 2020-04-15 NOTE — Progress Notes (Signed)
Chief Complaint:   OBESITY Cassandra Tucker is here to discuss her progress with her obesity treatment plan along with follow-up of her obesity related diagnoses. Cassandra Tucker is on the Category 4 Plan and states she is following her eating plan approximately 80% of the time. Cassandra Tucker states she is doing 0 minutes 0 times per week.  Today's visit was #: 4 Starting weight: 316 lbs Starting date: 02/18/2020 Today's weight: 322 lbs Today's date: 04/15/2020 Total lbs lost to date: 0 Total lbs lost since last in-office visit: 0  Interim History: Cassandra Tucker injured her foot and she hasn't been able to exercise. She notes struggling with some emotional eating and also feeling bloated.  Subjective:   1. Essential hypertension Cassandra Tucker's blood pressure is elevated today. She is not on medications, and she has a history of fluid retention.  2. Type 2 diabetes mellitus with other specified complication, without long-term current use of insulin (HCC) Cassandra Tucker is stable on Victoza, and she did not bring her BGs log today. She is working on weight loss.  3. Anxiety Cassandra Tucker has a history of anxiety and she feels this is worsening. She would like to look at other options.  Assessment/Plan:   1. Essential hypertension Cassandra Tucker is working on healthy weight loss and exercise to improve blood pressure control. We will watch for signs of hypotension as she continues her lifestyle modifications. Cassandra Tucker agreed to start chlorthalidone 25 mg q daily with no refills. We will recheck her blood pressure in 3 weeks.  - chlorthalidone (HYGROTON) 25 MG tablet; Take 1 tablet (25 mg total) by mouth daily.  Dispense: 30 tablet; Refill: 0  2. Type 2 diabetes mellitus with other specified complication, without long-term current use of insulin (HCC) Good blood sugar control is important to decrease the likelihood of diabetic complications such as nephropathy, neuropathy, limb loss, blindness, coronary artery disease, and  death. Intensive lifestyle modification including diet, exercise and weight loss are the first line of treatment for diabetes. Cassandra Tucker will continue Victoza at 0.6 mg and we will recheck labs in 3 weeks.  3. Anxiety Behavior modification techniques were discussed today to help Cassandra Tucker deal with her anxiety. Cassandra Tucker agreed to start Lexapro 10 mg q daily and will follow up in 3 weeks. Orders and follow up as documented in patient record.   - escitalopram (LEXAPRO) 10 MG tablet; Take 1 tablet (10 mg total) by mouth daily.  Dispense: 30 tablet; Refill: 0  4. Class 3 severe obesity with serious comorbidity and body mass index (BMI) of 50.0 to 59.9 in adult, unspecified obesity type Cassandra Tucker) Cassandra Tucker is currently in the action stage of change. As such, her goal is to continue with weight loss efforts. She has agreed to the Category 4 Plan.   Cassandra Tucker's goal is to continue her Category 4 plan as close as possible, and will start exercising as tolerated.  Behavioral modification strategies: increasing lean protein intake, increasing vegetables and no skipping meals.  Cassandra Tucker has agreed to follow-up with our clinic in 3 weeks. She was informed of the importance of frequent follow-up visits to maximize her success with intensive lifestyle modifications for her multiple health conditions.   Objective:   Blood pressure (!) 162/82, pulse 87, temperature 98 F (36.7 C), height 5\' 5"  (1.651 m), weight (!) 322 lb (146.1 kg), SpO2 98 %, not currently breastfeeding. Body mass index is 53.58 kg/m.  General: Cooperative, alert, well developed, in no acute distress. HEENT: Conjunctivae and lids unremarkable. Cardiovascular: Regular rhythm.  Lungs: Normal work of breathing. Neurologic: No focal deficits.   Lab Results  Component Value Date   CREATININE 0.60 02/18/2020   BUN 10 02/18/2020   NA 140 02/18/2020   K 4.7 02/18/2020   CL 104 02/18/2020   CO2 25 02/18/2020   Lab Results  Component Value  Date   ALT 8 02/18/2020   AST 10 02/18/2020   ALKPHOS 67 02/18/2020   BILITOT 0.7 02/18/2020   Lab Results  Component Value Date   HGBA1C 5.8 (H) 02/18/2020   HGBA1C 4.8 08/10/2017   HGBA1C 5.6 05/01/2017   HGBA1C 6.0 (H) 02/14/2017   HGBA1C 6.9 11/14/2016   Lab Results  Component Value Date   INSULIN 20.9 02/18/2020   INSULIN 60.5 (H) 02/14/2017   Lab Results  Component Value Date   TSH 1.820 02/18/2020   Lab Results  Component Value Date   CHOL 214 (H) 02/18/2020   HDL 38 (L) 02/18/2020   LDLCALC 150 (H) 02/18/2020   TRIG 142 02/18/2020   CHOLHDL 5.3 (H) 11/14/2016   Lab Results  Component Value Date   WBC 10.3 02/18/2020   HGB 12.2 02/18/2020   HCT 37.7 02/18/2020   MCV 76 (L) 02/18/2020   PLT 282 02/18/2020   Lab Results  Component Value Date   FERRITIN 45 02/07/2016   Attestation Statements:   Reviewed by clinician on day of visit: allergies, medications, problem list, medical history, surgical history, family history, social history, and previous encounter notes.   I, Burt Knack, am acting as transcriptionist for Quillian Quince, MD.  I have reviewed the above documentation for accuracy and completeness, and I agree with the above. -  Quillian Quince, MD

## 2020-05-03 ENCOUNTER — Other Ambulatory Visit: Payer: Self-pay | Admitting: Allergy

## 2020-05-05 ENCOUNTER — Encounter (INDEPENDENT_AMBULATORY_CARE_PROVIDER_SITE_OTHER): Payer: Self-pay | Admitting: Family Medicine

## 2020-05-05 ENCOUNTER — Other Ambulatory Visit: Payer: Self-pay

## 2020-05-05 ENCOUNTER — Encounter (HOSPITAL_COMMUNITY): Payer: Self-pay

## 2020-05-05 ENCOUNTER — Ambulatory Visit (HOSPITAL_COMMUNITY)
Admission: EM | Admit: 2020-05-05 | Discharge: 2020-05-05 | Disposition: A | Discharge status: Home / Self Care | Payer: Medicaid Other | Attending: Physician Assistant | Admitting: Physician Assistant

## 2020-05-05 ENCOUNTER — Ambulatory Visit (INDEPENDENT_AMBULATORY_CARE_PROVIDER_SITE_OTHER): Payer: Medicaid Other | Admitting: Family Medicine

## 2020-05-05 ENCOUNTER — Ambulatory Visit (INDEPENDENT_AMBULATORY_CARE_PROVIDER_SITE_OTHER): Payer: Medicaid Other

## 2020-05-05 VITALS — BP 141/93 | HR 78 | Temp 97.9°F | Ht 65.0 in | Wt 320.0 lb

## 2020-05-05 DIAGNOSIS — M25531 Pain in right wrist: Secondary | ICD-10-CM | POA: Diagnosis not present

## 2020-05-05 DIAGNOSIS — W19XXXA Unspecified fall, initial encounter: Secondary | ICD-10-CM

## 2020-05-05 DIAGNOSIS — Z6841 Body Mass Index (BMI) 40.0 and over, adult: Secondary | ICD-10-CM | POA: Diagnosis not present

## 2020-05-05 DIAGNOSIS — E1169 Type 2 diabetes mellitus with other specified complication: Secondary | ICD-10-CM

## 2020-05-05 DIAGNOSIS — S63501A Unspecified sprain of right wrist, initial encounter: Secondary | ICD-10-CM

## 2020-05-05 DIAGNOSIS — S8001XA Contusion of right knee, initial encounter: Secondary | ICD-10-CM

## 2020-05-05 MED ORDER — VICTOZA 18 MG/3ML ~~LOC~~ SOPN: 0.6000 mg | PEN_INJECTOR | Freq: Every day | SUBCUTANEOUS | 0 refills | Status: DC

## 2020-05-05 NOTE — ED Triage Notes (Signed)
Pt presents with a right knee injury and pain on her right wrist. Pt states that her left upper leg is swollen and bruised after falling. She denies being unconscious after her fall. She states her wrist hurts when trying to pick up her phone or open a bottle of water.

## 2020-05-05 NOTE — Discharge Instructions (Addendum)
Return if any problems.

## 2020-05-05 NOTE — ED Provider Notes (Signed)
[facility]    [name]: [id number] Arrival date & time: 05/05/20  0825      History   Chief Complaint Chief Complaint  Patient presents with  . Wrist Pain    right   . Knee Injury    right     HPI Cassandra Tucker is a 29 y.o. female.   Pt reports she fell yesterday and hit her knee and her hand.  Pt reports her knee is sore but she does not feel like she broke anything.  Pt complains of pain in her wrist with moving or lifting  The history is provided by the patient. No language interpreter was used.  Wrist Pain This is a new problem. The problem occurs constantly. The problem has been gradually worsening. Nothing aggravates the symptoms. Nothing relieves the symptoms. She has tried nothing for the symptoms. The treatment provided no relief.    Past Medical History:  Diagnosis Date  . Ankle fracture, left 2000  . Anxiety   . Arthritis    left wrist and left ankle  . Asthma   . Back pain   . Back pain   . Bronchitis   . Depression   . Diabetes (HCC) 01/11/2016   type II  history of  . Family history of adverse reaction to anesthesia    gmother had problems with N/V   . GERD (gastroesophageal reflux disease)   . Headache   . High cholesterol   . History of kidney stones   . HLD (hyperlipidemia)   . HSV-2 infection   . Hypertension 2013  . Intracranial hypertension    psuedotumor cebrum  . Joint pain   . Joint pain   . Kienbock's disease   . Leg edema   . OSA (obstructive sleep apnea)    cpap - does not know settings   . Papilledema   . Prediabetes   . Pregnancy induced hypertension   . Pseudotumor cerebri syndrome   . RLS (restless legs syndrome)   . Sinus tachycardia   . SOB (shortness of breath)     Patient Active Problem List   Diagnosis Date Noted  . Severe persistent asthma with (acute) exacerbation 03/25/2020  . Hyperlipidemia, mixed 03/03/2020  . Not well controlled severe persistent asthma 02/19/2020  . Vaccine counseling  02/19/2020  . Seasonal and perennial allergic rhinoconjunctivitis 01/01/2020  . Postpartum anemia 07/20/2019  . Postpartum care following cesarean delivery 2/4 07/18/2019  . Cesarean delivery -  IOL with fetal intolerance of labor and FTP 07/18/2019  . Marginal insertion of umbilical cord affecting management of mother 07/16/2019  . Chronic hypertension affecting pregnancy 07/01/2019  . Pregnancy 06/29/2019  . BMI 45.0-49.9, adult (HCC) 06/29/2019  . Pregnancy-induced hypertension 06/26/2019  . Anemia 05/21/2019  . Asthma, not well controlled, unspecified asthma severity, uncomplicated 05/18/2019  . Maternal obesity affecting pregnancy, antepartum 01/28/2019  . History of depression 01/28/2019  . Anxiety 01/26/2019  . History of bariatric surgery 01/13/2019  . Genital herpes simplex 01/13/2019  . Pregnant 12/25/2018  . Vitamin D deficiency 02/26/2017  . Pre-diabetes 02/26/2017  . Class 3 obesity with serious comorbidity and body mass index (BMI) of 60.0 to 69.9 in adult 02/26/2017  . Controlled type 2 diabetes mellitus without complication, without long-term current use of insulin (HCC) 04/21/2016  . Gastroesophageal reflux disease without esophagitis 04/21/2016  . Pseudotumor cerebri syndrome 11/29/2015  . Super obesity 11/29/2015  . OSA on CPAP 11/29/2015  . RLS (restless legs syndrome) 11/29/2015  .  Papilledema 06/16/2015  . Pain in the wrist 04/28/2014  . 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 Elevated cholesterol 01/21/2014  . Sinus tachycardia 12/04/2011  . Essential hypertension 12/04/2011  . Hypertension 12/04/2011  . Morbid obesity (HCC) 11/07/2011    Past Surgical History:  Procedure Laterality Date  . CESAREAN SECTION N/A 07/17/2019   Procedure: CESAREAN SECTION;  Surgeon: Osborn Coho, MD;  Location: [facility] LD ORS;  Service: Obstetrics;  Laterality: N/A;  . FRACTURE SURGERY  2005   ankle  . FRACTURE SURGERY  2013   wrist (deteriorating bone)  . LAPAROSCOPIC  GASTRIC SLEEVE RESECTION N/A 05/08/2017   Procedure: LAPAROSCOPIC GASTRIC SLEEVE RESECTION;  Surgeon: Berna Bue, MD;  Location: [facility] ORS;  Service: General;  Laterality: N/A;  . LAPAROSCOPIC GASTRIC SLEEVE RESECTION  05/08/2017  . UPPER GI ENDOSCOPY  05/08/2017   Procedure: UPPER GI ENDOSCOPY;  Surgeon: Berna Bue, MD;  Location: [facility] ORS;  Service: General;;  . urethra stretch    . WRIST SURGERY  2015    OB History    Gravida  1   Para  1   Term  1   Preterm  0   AB  0   Living  1     SAB  0   TAB  0   Ectopic  0   Multiple  0   Live Births  1            Home Medications    Prior to Admission medications   Medication Sig Start Date End Date Taking? Authorizing Provider  albuterol (PROVENTIL) (2.5 MG/3ML) 0.083% nebulizer solution Take 3 mLs (2.5 mg total) by nebulization every 4 (four) hours as needed for wheezing or shortness of breath. 02/19/20   Ellamae Sia, DO  budesonide-formoterol (SYMBICORT) 160-4.5 MCG/ACT inhaler Inhale 2 puffs into the lungs in the morning and at bedtime. with spacer and rinse mouth afterwards. 01/08/20   Ellamae Sia, DO  cetirizine (ZYRTEC) 10 MG tablet Take 10 mg by mouth daily. 03/27/20   [provider]  chlorthalidone (HYGROTON) 25 MG tablet Take 1 tablet (25 mg total) by mouth daily. 04/14/20   Quillian Quince D, MD  escitalopram (LEXAPRO) 10 MG tablet Take 1 tablet (10 mg total) by mouth daily. 04/14/20   Quillian Quince D, MD  fluticasone (FLONASE) 50 MCG/ACT nasal spray Place 2 sprays into both nostrils daily. 10/03/19   Daphine Deutscher, Mary-Margaret, FNP  Insulin Pen Needle 31G X 5 MM MISC 1 each by Does not apply route daily. Use one pen needle daily to inject Victoza. 04/01/20   Quillian Quince D, MD  levonorgestrel (MIRENA, 52 MG,) 20 MCG/24HR IUD Mirena 20 mcg/24 hours (6 yrs) 52 mg intrauterine device  Take 1 device by intrauterine route.    [provider]  liraglutide (VICTOZA) 18 MG/3ML SOPN Inject 0.6 mg  into the skin daily. Inject 0.6mg  under the skin once daily. 03/24/20   Quillian Quince D, MD  loratadine (CLARITIN) 10 MG tablet Take 10 mg by mouth daily. 03/27/20   [provider]  meloxicam (MOBIC) 15 MG tablet Take 1 tablet (15 mg total) by mouth daily. 04/09/20   Felecia Shelling, DPM  montelukast (SINGULAIR) 10 MG tablet Take 1 tablet (10 mg total) by mouth at bedtime. 02/05/20   Ellamae Sia, DO  NIFEdipine (ADALAT CC) 60 MG 24 hr tablet Take 60 mg by mouth every morning.     [provider]  SPIRIVA RESPIMAT 1.25 MCG/ACT  AERS SMARTSIG:2 Puff(s) Via Inhaler Daily 03/27/20   [provider]  VENTOLIN HFA 108 (90 Base) MCG/ACT inhaler INHALE 2 PUFFS INTO THE LUNGS EVERY 6 HOURS AS NEEDED FOR WHEEZING OR SHORTNESS OF BREATH 05/03/20   Ellamae SiaKim, Yoon M, DO  Vitamin D, Ergocalciferol, (DRISDOL) 1.25 MG (50000 UNIT) CAPS capsule Take 1 capsule (50,000 Units total) by mouth every 7 (seven) days. 03/04/20   Wilder GladeBeasley, Caren D, MD    Family History Family History  Problem Relation Age of Onset  . Heart attack Mother   . Hypertension Mother   . Obesity Mother   . Depression Mother   . Anxiety disorder Mother   . Bipolar disorder Mother   . Allergic rhinitis Mother   . Sleep apnea Mother   . Cancer Father   . Allergic rhinitis Father   . Depression Father   . Alcoholism Father   . Allergic rhinitis Brother   . Allergic rhinitis Maternal Grandmother   . Migraines Neg Hx   . Asthma Neg Hx   . Eczema Neg Hx   . Urticaria Neg Hx     Social History Social History   Tobacco Use  . Smoking status: Former Smoker    Packs/day: 1.00    Types: Cigarettes    Quit date: 10/05/2018    Years since quitting: 1.5  . Smokeless tobacco: Never Used  Vaping Use  . Vaping Use: Never used  Substance Use Topics  . Alcohol use: Yes    Alcohol/week: 0.0 standard drinks    Comment: socially  . Drug use: Yes    Types: Marijuana    Comment: early in pregnancy for nausea      Allergies   Cats claw (uncaria tomentosa), Dust mite extract, Grass pollen(k-o-r-t-swt vern), and Mixed feathers   Review of Systems Review of Systems  All other systems reviewed and are negative.    Physical Exam Triage Vital Signs ED Triage Vitals  Enc Vitals Group     BP 05/05/20 0849 (!) 154/91     Pulse Rate 05/05/20 0849 84     Resp 05/05/20 0849 18     Temp 05/05/20 0849 98.3 F (36.8 C)     Temp Source 05/05/20 0849 Oral     SpO2 05/05/20 0849 100 %     Weight --      Height --      Head Circumference --      Peak Flow --      Pain Score 05/05/20 0846 9     Pain Loc --      Pain Edu? --      Excl. in GC? --    No data found.  Updated Vital Signs BP (!) 154/91 (BP Location: Right Arm)   Pulse 84   Temp 98.3 F (36.8 C) (Oral)   Resp 18   SpO2 100%   Visual Acuity Right Eye Distance:   Left Eye Distance:   Bilateral Distance:    Right Eye Near:   Left Eye Near:    Bilateral Near:     Physical Exam Vitals and nursing note reviewed.  Constitutional:      Appearance: She is well-developed.  HENT:     Head: Normocephalic.  Pulmonary:     Effort: Pulmonary effort is normal.  Abdominal:     General: There is no distension.  Musculoskeletal:        General: Normal range of motion.     Cervical back: Normal range of motion.  Comments: Tender right wrist.  Contusion and abrasion right knee  Skin:    General: Skin is warm.  Neurological:     General: No focal deficit present.     Mental Status: She is alert and oriented to person, place, and time.  Psychiatric:        Mood and Affect: Mood normal.      UC Treatments / Results  Labs (all labs ordered are listed, but only abnormal results are displayed) Labs Reviewed - No data to display  EKG   Radiology DG Wrist Complete Right  Result Date: 05/05/2020 CLINICAL DATA:  Pain following fall EXAM: RIGHT WRIST - COMPLETE 3+ VIEW COMPARISON:  None. FINDINGS: Frontal, oblique,  lateral, and ulnar deviation scaphoid images were obtained. There is no fracture or dislocation. Joint spaces appear normal. No erosive change. There is a minus ulnar variance. IMPRESSION: No fracture or dislocation.  No evident arthropathy. Electronically Signed   By: Bretta Bang III M.D.   On: 05/05/2020 09:58    Procedures Procedures (including critical care time)  Medications Ordered in UC Medications - No data to display  Initial Impression / Assessment and Plan / UC Course  I have reviewed the triage vital signs and the nursing notes.  Pertinent labs & imaging results that were available during my care of the patient were reviewed by me and considered in my medical decision making (see chart for details).     MDM:  Xray wrist no fracture.  Pt placed in ace wrap, I advised ibuprofen  Final Clinical Impressions(s) / UC Diagnoses   Final diagnoses:  Sprain of right wrist, initial encounter  Contusion of right knee, initial encounter     Discharge Instructions     Return if any problems.    ED Prescriptions    None     PDMP not reviewed this encounter.  An After Visit Summary was printed and given to the patient.    Elson Areas, [place] 05/05/20 1035

## 2020-05-13 NOTE — Progress Notes (Signed)
Chief Complaint:   OBESITY Cassandra Tucker is here to discuss her progress with her obesity treatment plan along with follow-up of her obesity related diagnoses. Cassandra Tucker is on the Category 4 Plan and states she is following her eating plan approximately 85-90% of the time. Cassandra Tucker states she is walking 2-3 miles 5 times per week.   Today's visit was #: 5 Starting weight: 316 lbs Starting date: 02/18/2020 Today's weight: 320 lbs Today's date: 05/05/2020 Total lbs lost to date: 2 Total lbs lost since last in-office visit: 0  Interim History: Cassandra Tucker continues to do well with weight loss. Her hunger is controlled and she sometimes struggles to eat all the food on her plan.  Subjective:   1. Type 2 diabetes mellitus with other specified complication, without long-term current use of insulin (HCC) Cassandra Tucker notes decreased polyphagia on Victoza. She denies nausea, vomiting, or hypoglycemia.  Assessment/Plan:   1. Type 2 diabetes mellitus with other specified complication, without long-term current use of insulin (HCC) Good blood sugar control is important to decrease the likelihood of diabetic complications such as nephropathy, neuropathy, limb loss, blindness, coronary artery disease, and death. Intensive lifestyle modification including diet, exercise and weight loss are the first line of treatment for diabetes. We will refill Victoza 0.6 mg for 1 month. Cassandra Tucker is to continue to work on eating all her protein.  2. Class 3 severe obesity with serious comorbidity and body mass index (BMI) of 50.0 to 59.9 in adult, unspecified obesity type (HCC) Cassandra Tucker is currently in the action stage of change. As such, her goal is to continue with weight loss efforts. She has agreed to the Category 4 Plan.   Exercise goals: As is.  Behavioral modification strategies: meal planning and cooking strategies and holiday eating strategies .  Cassandra Tucker has agreed to follow-up with our clinic in 2 weeks. She  was informed of the importance of frequent follow-up visits to maximize her success with intensive lifestyle modifications for her multiple health conditions.   Objective:   Blood pressure (!) 141/93, pulse 78, temperature 97.9 F (36.6 C), height 5\' 5"  (1.651 m), weight (!) 320 lb (145.2 kg), SpO2 99 %, not currently breastfeeding. Body mass index is 53.25 kg/m.  General: Cooperative, alert, well developed, in no acute distress. HEENT: Conjunctivae and lids unremarkable. Cardiovascular: Regular rhythm.  Lungs: Normal work of breathing. Neurologic: No focal deficits.   Lab Results  Component Value Date   CREATININE 0.60 02/18/2020   BUN 10 02/18/2020   NA 140 02/18/2020   K 4.7 02/18/2020   CL 104 02/18/2020   CO2 25 02/18/2020   Lab Results  Component Value Date   ALT 8 02/18/2020   AST 10 02/18/2020   ALKPHOS 67 02/18/2020   BILITOT 0.7 02/18/2020   Lab Results  Component Value Date   HGBA1C 5.8 (H) 02/18/2020   HGBA1C 4.8 08/10/2017   HGBA1C 5.6 05/01/2017   HGBA1C 6.0 (H) 02/14/2017   HGBA1C 6.9 11/14/2016   Lab Results  Component Value Date   INSULIN 20.9 02/18/2020   INSULIN 60.5 (H) 02/14/2017   Lab Results  Component Value Date   TSH 1.820 02/18/2020   Lab Results  Component Value Date   CHOL 214 (H) 02/18/2020   HDL 38 (L) 02/18/2020   LDLCALC 150 (H) 02/18/2020   TRIG 142 02/18/2020   CHOLHDL 5.3 (H) 11/14/2016   Lab Results  Component Value Date   WBC 10.3 02/18/2020   HGB 12.2 02/18/2020  HCT 37.7 02/18/2020   MCV 76 (L) 02/18/2020   PLT 282 02/18/2020   Lab Results  Component Value Date   FERRITIN 45 02/07/2016   Attestation Statements:   Reviewed by clinician on day of visit: allergies, medications, problem list, medical history, surgical history, family history, social history, and previous encounter notes.   I, Burt Knack, am acting as transcriptionist for Quillian Quince, MD.  I have reviewed the above documentation for  accuracy and completeness, and I agree with the above. -  Quillian Quince, MD

## 2020-05-14 ENCOUNTER — Ambulatory Visit: Payer: Medicaid Other | Admitting: Podiatry

## 2020-05-18 ENCOUNTER — Encounter: Payer: Self-pay | Admitting: Allergy

## 2020-05-19 ENCOUNTER — Ambulatory Visit (INDEPENDENT_AMBULATORY_CARE_PROVIDER_SITE_OTHER): Payer: Medicaid Other | Admitting: Family Medicine

## 2020-05-19 NOTE — Progress Notes (Signed)
RE: Cassandra Tucker MRN: [id number] DOB: Oct 03, 1990 Date of Telemedicine Visit: 05/20/2020  Referring provider: [name], * Primary care provider: [name], FNP  Chief Complaint: Allergic Rhinitis  (Patient is doing great compared to her last visit. )   Telemedicine Follow Up Visit via Telephone: I connected with Cassandra Tucker for a follow up on 05/20/20 by telephone and verified that I am speaking with the correct person using two identifiers.   I discussed the limitations, risks, security and privacy concerns of performing an evaluation and management service by telephone and the availability of in person appointments. I also discussed with the patient that there may be a patient responsible charge related to this service. The patient expressed understanding and agreed to proceed.  Patient is at home/work. Provider is at the office.  Visit start time: 11:31AM Visit end time: 11:51AM Insurance consent/check in by: front desk Medical consent and medical assistant/nurse: Lachelle S.  History of Present Illness: She is a 29 y.o. female, who is being followed for asthma on Fasenra, allergic rhino conjunctivitis. Her previous allergy office visit was on 03/25/2020 with Dr. Selena Batten. Today is a regular follow up visit and to discuss COVID-19 vaccination.   Severe persistent asthma Currently on Fasenra injections at home and doing well. She has had 2 injections to date. Patient stopped all her daily inhalers about the last month.  No albuterol use recently. Denies any SOB, coughing, wheezing, chest tightness, nocturnal awakenings, ER/urgent care visits or prednisone use since the last visit.  Did not get sleep study done yet.  Stopped Singulair as well.   Seasonal and perennial allergic rhinoconjunctivitis Currently using Flonase 2 sprays per nostril twice a day and using azelastine 2 sprays BID with good benefit. complaining of dry nose.  Taking zyrtec  daily.  Patient concerned about the COVID-19 vaccinations as some of her friends/family had some asthma flares and other reactions.   No prior issues with vaccinations but only had flu injection once. She is not aware of any reactions to her childhood vaccinations.   Assessment and Plan: Linnae is a 29 y.o. female with: Severe persistent asthma without complication Past history - Symptoms of chest tightness, shortness of breath, coughing, wheezing, nocturnal awakenings for 1 year. History of reflux but no longer on PPI. Ex-smoker. 2021 spirometry showed: normal pattern and 13% improvement in FEV1 post bronchodilator treatment. Clinically feeling better. Interim history - doing much better since started [place]. Stopped all maintenance inhalers and Singulair for 1 month now.  . Continue Fasenra injections at home. . Daily controller medication(s): restart Symbicort 2 puffs twice a day with spacer and rinse mouth afterwards. Marland Kitchen STOP Spiriva 1.72mcg 2 puffs once a day. STOP Singulair (montelukast) 10mg  daily at night. May use albuterol rescue inhaler 2 puffs or nebulizer every 4 to 6 hours as needed for shortness of breath, chest tightness, coughing, and wheezing. May use albuterol rescue inhaler 2 puffs 5 to 15 minutes prior to strenuous physical activities. Monitor frequency of use.  During upper respiratory infections/asthma flares: start Alvesco 1 puffs twice a day with spacer and rinse mouth afterwards for 1-2 weeks until your breathing symptoms return to baseline.  Repeat spirometry at next visit. Get sleep study done - see PCP for this.  Recommend getting COVID-19 vaccination.  Seasonal and perennial allergic rhinoconjunctivitis Past history - Perennial rhino conjunctivitis symptoms for 20+ years with worsening in the spring. No previous ENT evaluation. Tried Claritin, Flonase and Zyrtec with some benefit. 2021  skin testing showed: Positive to grass, dust mites, cats,  feathers. Negative to common foods.  Interim history - dry nares.  Continue environmental control measures as below.  May use over the counter antihistamines such as Zyrtec (cetirizine), Claritin (loratadine), Allegra (fexofenadine), or Xyzal (levocetirizine) daily. May take it twice a day if needed.   ONLY use Flonase 2 sprays per nostril max per day.   STOP azelastine for now as it can make the dryness worse.  Vaccine counseling  Patient is hesitant about the vaccine as some of her family/friends had some side effects from it.   Patient has no known history of reactions to vaccines in the past.   She does not meet medical criteria for vaccine exemption and told patient that I can't write a letter for her personal beliefs.   Recommend getting the COVID-19 vaccine.   Return in about 2 months (around 07/21/2020).  Diagnostics: None.  Medication List:  Current Outpatient Medications  Medication Sig Dispense Refill  . albuterol (PROVENTIL) (2.5 MG/3ML) 0.083% nebulizer solution Take 3 mLs (2.5 mg total) by nebulization every 4 (four) hours as needed for wheezing or shortness of breath. 75 mL 1  . budesonide-formoterol (SYMBICORT) 160-4.5 MCG/ACT inhaler Inhale 2 puffs into the lungs in the morning and at bedtime. with spacer and rinse mouth afterwards. 1 Inhaler 5  . cetirizine (ZYRTEC) 10 MG tablet Take 10 mg by mouth daily.    . chlorthalidone (HYGROTON) 25 MG tablet Take 1 tablet (25 mg total) by mouth daily. 30 tablet 0  . escitalopram (LEXAPRO) 10 MG tablet Take 1 tablet (10 mg total) by mouth daily. 30 tablet 0  . fluticasone (FLONASE) 50 MCG/ACT nasal spray Place 2 sprays into both nostrils daily. 16 g 6  . Insulin Pen Needle 31G X 5 MM MISC 1 each by Does not apply route daily. Use one pen needle daily to inject Victoza. 100 each 0  . levonorgestrel (MIRENA, 52 MG,) 20 MCG/24HR IUD Mirena 20 mcg/24 hours (6 yrs) 52 mg intrauterine device  Take 1 device by intrauterine route.     . liraglutide (VICTOZA) 18 MG/3ML SOPN Inject 0.6 mg into the skin daily. Inject 0.6mg  under the skin once daily. 3 mL 0  . loratadine (CLARITIN) 10 MG tablet Take 10 mg by mouth daily.    . meloxicam (MOBIC) 15 MG tablet Take 1 tablet (15 mg total) by mouth daily. 30 tablet 1  . VENTOLIN HFA 108 (90 Base) MCG/ACT inhaler INHALE 2 PUFFS INTO THE LUNGS EVERY 6 HOURS AS NEEDED FOR WHEEZING OR SHORTNESS OF BREATH 18 g 1  . Vitamin D, Ergocalciferol, (DRISDOL) 1.25 MG (50000 UNIT) CAPS capsule Take 1 capsule (50,000 Units total) by mouth every 7 (seven) days. 4 capsule 0  . NIFEdipine (ADALAT CC) 60 MG 24 hr tablet Take 60 mg by mouth every morning.      No current facility-administered medications for this visit.   Allergies: Allergies  Allergen Reactions  . Cats Claw (Uncaria [place]entosa) Itching, Shortness Of Breath and Swelling  . Dust Mite Extract Itching and Shortness Of Breath  . Grass Pollen(K-O-R-T-Swt Vern) Itching and Shortness Of Breath  . Mixed Feathers Itching, Shortness Of Breath and Swelling   I reviewed her past medical history, social history, family history, and environmental history and no significant changes have been reported from her previous visit.  Review of Systems  Constitutional: Negative for appetite change, chills, fever and unexpected weight change.  HENT: Negative for congestion, postnasal drip,  rhinorrhea and sneezing.   Eyes: Negative for itching.  Respiratory: Negative for cough, chest tightness, shortness of breath and wheezing.   Cardiovascular: Negative for chest pain.  Gastrointestinal: Negative for abdominal pain.  Genitourinary: Negative for difficulty urinating.  Skin: Negative for rash.  Allergic/Immunologic: Positive for environmental allergies. Negative for food allergies.  Neurological: Negative for headaches.   Objective: Physical Exam Not obtained as encounter was done via telephone.   Previous notes and tests were reviewed.  I  discussed the assessment and treatment plan with the patient. The patient was provided an opportunity to ask questions and all were answered. The patient agreed with the plan and demonstrated an understanding of the instructions. After visit summary/patient instructions available via mychart.   The patient was advised to call back or seek an in-person evaluation if the symptoms worsen or if the condition fails to improve as anticipated.  I provided 20 minutes of non-face-to-face time during this encounter.  It was my pleasure to participate in [facility]'s care today. Please feel free to contact me with any questions or concerns.   Sincerely,  Wyline Mood, DO Allergy & Immunology  [facility] office: [phone] [facility] office: [phone]

## 2020-05-20 ENCOUNTER — Other Ambulatory Visit: Payer: Self-pay

## 2020-05-20 ENCOUNTER — Ambulatory Visit (INDEPENDENT_AMBULATORY_CARE_PROVIDER_SITE_OTHER): Payer: Medicaid Other | Admitting: Allergy

## 2020-05-20 ENCOUNTER — Encounter: Payer: Self-pay | Admitting: Allergy

## 2020-05-20 DIAGNOSIS — J302 Other seasonal allergic rhinitis: Secondary | ICD-10-CM

## 2020-05-20 DIAGNOSIS — Z7185 Encounter for immunization safety counseling: Secondary | ICD-10-CM

## 2020-05-20 DIAGNOSIS — J4550 Severe persistent asthma, uncomplicated: Secondary | ICD-10-CM | POA: Diagnosis not present

## 2020-05-20 DIAGNOSIS — J3089 Other allergic rhinitis: Secondary | ICD-10-CM

## 2020-05-20 DIAGNOSIS — H1010 Acute atopic conjunctivitis, unspecified eye: Secondary | ICD-10-CM

## 2020-05-20 NOTE — Assessment & Plan Note (Signed)
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 No previous ENT evaluation. Tried Claritin, Flonase and Zyrtec with some benefit. 2021 skin testing showed: Positive to grass, dust mites, cats, feathers. Negative to common foods.  Interim history - dry nares.  Continue environmental control measures as below.  May use over the counter antihistamines such as Zyrtec (cetirizine), Claritin (loratadine), Allegra (fexofenadine), or Xyzal (levocetirizine) daily. May take it twice a day if needed.   ONLY use Flonase 2 sprays per nostril max per day.   STOP azelastine for now as it can make the dryness worse.

## 2020-05-20 NOTE — Assessment & Plan Note (Signed)
Past history - Symptoms of chest tightness, shortness of breath, coughing, wheezing, nocturnal awakenings for 1 year. History of reflux but no longer on PPI. Ex-smoker. 2021 spirometry showed: normal pattern and 13% improvement in FEV1 post bronchodilator treatment. Clinically feeling better. Interim history - doing much better since started [place]. Stopped all maintenance inhalers and Singulair for 1 month now.  . Continue Fasenra injections at home. . Daily controller medication(s): restart Symbicort 2 puffs twice a day with spacer and rinse mouth afterwards. Marland Kitchen STOP Spiriva 1.74mcg 2 puffs once a day. STOP Singulair (montelukast) 10mg  daily at night. May use albuterol rescue inhaler 2 puffs or nebulizer every 4 to 6 hours as needed for shortness of breath, chest tightness, coughing, and wheezing. May use albuterol rescue inhaler 2 puffs 5 to 15 minutes prior to strenuous physical activities. Monitor frequency of use.  During upper respiratory infections/asthma flares: start Alvesco 1 puffs twice a day with spacer and rinse mouth afterwards for 1-2 weeks until your breathing symptoms return to baseline.  Repeat spirometry at next visit. Get sleep study done - see PCP for this.  Recommend getting COVID-19 vaccination.

## 2020-05-20 NOTE — Assessment & Plan Note (Signed)
   Patient is hesitant about the vaccine as some of her family/friends had some side effects from it.   Patient has no known history of reactions to vaccines in the past.   She does not meet medical criteria for vaccine exemption and told patient that I can't write a letter for her personal beliefs.   Recommend getting the COVID-19 vaccine.

## 2020-05-20 NOTE — Patient Instructions (Addendum)
Environmental allergies  Past skin testing showed: Positive to grass, dust mites, cats, feathers.  Continue environmental control measures as below.  May use over the counter antihistamines such as Zyrtec (cetirizine), Claritin (loratadine), Allegra (fexofenadine), or Xyzal (levocetirizine) daily. May take it twice a day if needed.   ONLY use Flonase 2 sprays per nostril max per day.   STOP azelastine for now as it can make the dryness worse in your nose.   Asthma: . Continue Fasenra injections at home. . Daily controller medication(s): restart Symbicort 2 puffs twice a day with spacer and rinse mouth afterwards. Marland Kitchen STOP Spiriva 1.47mcg 2 puffs once a day. STOP Singulair (montelukast) 10mg  daily at night. May use albuterol rescue inhaler 2 puffs or nebulizer every 4 to 6 hours as needed for shortness of breath, chest tightness, coughing, and wheezing. May use albuterol rescue inhaler 2 puffs 5 to 15 minutes prior to strenuous physical activities. Monitor frequency of use.  During upper respiratory infections/asthma flares: start Alvesco 1 puffs twice a day with spacer and rinse mouth afterwards for 1-2 weeks until your breathing symptoms return to baseline.  Asthma control goals:  Full participation in all desired activities (may need albuterol before activity) Albuterol use two times or less a week on average (not counting use with activity) Cough interfering with sleep two times or less a month Oral steroids no more than once a year No hospitalizations  Recommend getting the COVID-19 vaccine.  Here's more information:  Follow up in 2 months or sooner if needed in person so we can recheck your breathing test.   Reducing Pollen Exposure . Pollen seasons: trees (spring), grass (summer) and ragweed/weeds (fall). 10-19-1979 Keep windows closed in your home and car to lower pollen exposure.  Marland Kitchen air conditioning in the bedroom and  throughout the house if possible.  . Avoid going out in dry windy days - especially early morning. . Pollen counts are highest between 5 - 10 AM and on dry, hot and windy days.  . Save outside activities for late afternoon or after a heavy rain, when pollen levels are lower.  . Avoid mowing of grass if you have grass pollen allergy. [name] Be aware that pollen can also be transported indoors on people and pets.  . Dry your clothes in an automatic dryer rather than hanging them outside where they might collect pollen.  . Rinse hair and eyes before bedtime. Control of House Dust Mite Allergen . Dust mite allergens are a common trigger of allergy and asthma symptoms. While they can be found throughout the house, these microscopic creatures thrive in warm, humid environments such as bedding, upholstered furniture and carpeting. . Because so much time is spent in the bedroom, it is essential to reduce mite levels there.  . Encase pillows, mattresses, and box springs in special allergen-proof fabric covers or airtight, zippered plastic covers.  . Bedding should be washed weekly in hot water (130 F) and dried in a hot dryer. Allergen-proof covers are available for comforters and pillows that can't be regularly washed.  Marland Kitchen the allergy-proof covers every few months. Minimize clutter in the bedroom. Keep pets out of the bedroom.  [name] Keep humidity less than 50% by using a dehumidifier or air conditioning. You can buy a humidity measuring device called a hygrometer to monitor this.  . If possible, replace carpets with hardwood, linoleum, or washable area rugs. If that's not possible, vacuum frequently with a vacuum that has a  HEPA filter. . Remove all upholstered furniture and non-washable window drapes from the bedroom. . Remove all non-washable stuffed toys from the bedroom.  Wash stuffed toys weekly. Pet Allergen Avoidance: . Contrary to popular opinion, there are no "hypoallergenic" breeds of dogs or cats.  That is because people are not allergic to an animal's hair, but to an allergen found in the animal's saliva, dander (dead skin flakes) or urine. Pet allergy symptoms typically occur within minutes. For some people, symptoms can build up and become most severe 8 to 12 hours after contact with the animal. People with severe allergies can experience reactions in public places if dander has been transported on the pet owners' clothing. Marland Kitchen Keeping an animal outdoors is only a partial solution, since homes with pets in the yard still have higher concentrations of animal allergens. . Before getting a pet, ask your allergist to determine if you are allergic to animals. If your pet is already considered part of your family, try to minimize contact and keep the pet out of the bedroom and other rooms where you spend a great deal of time. . As with dust mites, vacuum carpets often or replace carpet with a hardwood floor, tile or linoleum. . High-efficiency particulate air (HEPA) cleaners can reduce allergen levels over time. . While dander and saliva are the source of cat and dog allergens, urine is the source of allergens from rabbits, hamsters, mice and [place] pigs; so ask a non-allergic family member to clean the animal's cage. . If you have a pet allergy, talk to your allergist about the potential for allergy immunotherapy (allergy shots). This strategy can often provide long-term relief.

## 2020-05-21 ENCOUNTER — Encounter: Payer: Self-pay | Admitting: Allergy

## 2020-05-25 NOTE — Telephone Encounter (Signed)
Spoke to patient when she called and advised that I just got approval this am for her Harrington Challenger and should be able to reach back out to pharmacy

## 2020-06-01 ENCOUNTER — Ambulatory Visit (INDEPENDENT_AMBULATORY_CARE_PROVIDER_SITE_OTHER): Payer: Medicaid Other | Admitting: Family Medicine

## 2020-06-02 ENCOUNTER — Other Ambulatory Visit: Payer: Self-pay | Admitting: Allergy

## 2020-06-20 ENCOUNTER — Other Ambulatory Visit: Payer: Self-pay | Admitting: Allergy

## 2020-06-21 ENCOUNTER — Ambulatory Visit: Payer: Medicaid Other | Admitting: Nurse Practitioner

## 2020-06-29 ENCOUNTER — Encounter: Payer: Self-pay | Admitting: Allergy

## 2020-07-08 ENCOUNTER — Other Ambulatory Visit: Payer: Self-pay

## 2020-07-08 ENCOUNTER — Ambulatory Visit
Admission: EM | Admit: 2020-07-08 | Discharge: 2020-07-08 | Disposition: A | Discharge status: Home / Self Care | Payer: Medicaid Other | Attending: Urgent Care | Admitting: Urgent Care

## 2020-07-08 ENCOUNTER — Encounter: Payer: Self-pay | Admitting: Urgent Care

## 2020-07-08 DIAGNOSIS — N3001 Acute cystitis with hematuria: Secondary | ICD-10-CM | POA: Diagnosis not present

## 2020-07-08 LAB — POCT URINALYSIS DIP (MANUAL ENTRY)
Bilirubin, UA: NEGATIVE
Glucose, UA: 100 mg/dL — AB
Ketones, POC UA: NEGATIVE mg/dL
Nitrite, UA: POSITIVE — AB
Protein Ur, POC: 100 mg/dL — AB
Spec Grav, UA: 1.025 (ref 1.010–1.025)
Urobilinogen, UA: 1 E.U./dL
pH, UA: 7 (ref 5.0–8.0)

## 2020-07-08 LAB — POCT URINE PREGNANCY: Preg Test, Ur: NEGATIVE

## 2020-07-08 MED ORDER — CEPHALEXIN 500 MG PO CAPS: 500.0000 mg | ORAL_CAPSULE | Freq: Two times a day (BID) | ORAL | 0 refills | Status: DC

## 2020-07-08 NOTE — ED Provider Notes (Signed)
[facility]   MRN: [id number] DOB: 10/16/1990  Subjective:   Cassandra Tucker is a 30 y.o. female presenting for 1.5 week history of dysuria, urinary frequency, urinary urgency. Denies fever, n/v, vaginal discharge, genital discharge. Hydrates well with water. Has 1 soda per day. No other urinary irritants. No breastfeeding. Has well controlled diabetes and is on Victoza.   No current facility-administered medications for this encounter.  Current Outpatient Medications:  .  FASENRA PEN 30 MG/ML SOAJ, INJECT 1 PEN UNDER THE SKIN AT WEEK 0, 4 AND 8, THEN INJECT 1 PEN EVERY 8 WEEKS THEREAFTER., Disp: 1 mL, Rfl: 7 .  VENTOLIN HFA 108 (90 Base) MCG/ACT inhaler, INHALE 2 PUFFS INTO THE LUNGS EVERY 6 HOURS AS NEEDED FOR WHEEZING OR SHORTNESS OF BREATH, Disp: 18 g, Rfl: 1 .  albuterol (PROVENTIL) (2.5 MG/3ML) 0.083% nebulizer solution, Take 3 mLs (2.5 mg total) by nebulization every 4 (four) hours as needed for wheezing or shortness of breath., Disp: 75 mL, Rfl: 1 .  budesonide-formoterol (SYMBICORT) 160-4.5 MCG/ACT inhaler, Inhale 2 puffs into the lungs in the morning and at bedtime. with spacer and rinse mouth afterwards., Disp: 1 Inhaler, Rfl: 5 .  cetirizine (ZYRTEC) 10 MG tablet, Take 10 mg by mouth daily., Disp: , Rfl:  .  chlorthalidone (HYGROTON) 25 MG tablet, Take 1 tablet (25 mg total) by mouth daily., Disp: 30 tablet, Rfl: 0 .  escitalopram (LEXAPRO) 10 MG tablet, Take 1 tablet (10 mg total) by mouth daily., Disp: 30 tablet, Rfl: 0 .  fluticasone (FLONASE) 50 MCG/ACT nasal spray, Place 2 sprays into both nostrils daily., Disp: 16 g, Rfl: 6 .  Insulin Pen Needle 31G X 5 MM MISC, 1 each by Does not apply route daily. Use one pen needle daily to inject Victoza., Disp: 100 each, Rfl: 0 .  levonorgestrel (MIRENA, 52 MG,) 20 MCG/24HR IUD, Mirena 20 mcg/24 hours (6 yrs) 52 mg intrauterine device  Take 1 device by intrauterine route., Disp: , Rfl:  .  liraglutide (VICTOZA) 18 MG/3ML  SOPN, Inject 0.6 mg into the skin daily. Inject 0.6mg  under the skin once daily., Disp: 3 mL, Rfl: 0 .  loratadine (CLARITIN) 10 MG tablet, Take 10 mg by mouth daily., Disp: , Rfl:  .  meloxicam (MOBIC) 15 MG tablet, Take 1 tablet (15 mg total) by mouth daily., Disp: 30 tablet, Rfl: 1 .  NIFEdipine (ADALAT CC) 60 MG 24 hr tablet, Take 60 mg by mouth every morning. , Disp: , Rfl:  .  Vitamin D, Ergocalciferol, (DRISDOL) 1.25 MG (50000 UNIT) CAPS capsule, Take 1 capsule (50,000 Units total) by mouth every 7 (seven) days., Disp: 4 capsule, Rfl: 0   Allergies  Allergen Reactions  . Cats Claw (Uncaria Tomentosa) Itching, Shortness Of Breath and Swelling  . Dust Mite Extract Itching and Shortness Of Breath  . Grass Pollen(K-O-R-T-Swt Vern) Itching and Shortness Of Breath  . Mixed Feathers Itching, Shortness Of Breath and Swelling    Past Medical History:  Diagnosis Date  . Ankle fracture, left 2000  . Anxiety   . Arthritis    left wrist and left ankle  . Asthma   . Back pain   . Back pain   . Bronchitis   . Depression   . Diabetes (HCC) 01/11/2016   type II  history of  . Family history of adverse reaction to anesthesia    gmother had problems with N/V   . GERD (gastroesophageal reflux disease)   . Headache   .  High cholesterol   . History of kidney stones   . HLD (hyperlipidemia)   . HSV-2 infection   . Hypertension 2013  . Intracranial hypertension    psuedotumor cebrum  . Joint pain   . Joint pain   . Kienbock's disease   . Leg edema   . OSA (obstructive sleep apnea)    cpap - does not know settings   . Papilledema   . Prediabetes   . Pregnancy induced hypertension   . Pseudotumor cerebri syndrome   . RLS (restless legs syndrome)   . Sinus tachycardia   . SOB (shortness of breath)      Past Surgical History:  Procedure Laterality Date  . CESAREAN SECTION N/A 07/17/2019   Procedure: CESAREAN SECTION;  Surgeon: Osborn Coho, MD;  Location: [facility]S;  Service:  Obstetrics;  Laterality: N/A;  . FRACTURE SURGERY  2005   ankle  . FRACTURE SURGERY  2013   wrist (deteriorating bone)  . LAPAROSCOPIC GASTRIC SLEEVE RESECTION N/A 05/08/2017   Procedure: LAPAROSCOPIC GASTRIC SLEEVE RESECTION;  Surgeon: Berna Bue, MD;  Location: [facility] ORS;  Service: General;  Laterality: N/A;  . LAPAROSCOPIC GASTRIC SLEEVE RESECTION  05/08/2017  . UPPER GI ENDOSCOPY  05/08/2017   Procedure: UPPER GI ENDOSCOPY;  Surgeon: Berna Bue, MD;  Location: [facility] ORS;  Service: General;;  . urethra stretch    . WRIST SURGERY  2015    Family History  Problem Relation Age of Onset  . Heart attack Mother   . Hypertension Mother   . Obesity Mother   . Depression Mother   . Anxiety disorder Mother   . Bipolar disorder Mother   . Allergic rhinitis Mother   . Sleep apnea Mother   . Cancer Father   . Allergic rhinitis Father   . Depression Father   . Alcoholism Father   . Allergic rhinitis Brother   . Allergic rhinitis Maternal Grandmother   . Migraines Neg Hx   . Asthma Neg Hx   . Eczema Neg Hx   . Urticaria Neg Hx     Social History   Tobacco Use  . Smoking status: Former Smoker    Packs/day: 1.00    Types: Cigarettes    Quit date: 10/05/2018    Years since quitting: 1.7  . Smokeless tobacco: Never Used  Vaping Use  . Vaping Use: Never used  Substance Use Topics  . Alcohol use: Yes    Alcohol/week: 0.0 standard drinks    Comment: socially  . Drug use: Yes    Types: Marijuana    Comment: early in pregnancy for nausea    ROS   Objective:   Vitals: BP (!) 165/111 (BP Location: Left Arm)   Pulse (!) 101   Temp 98.1 F (36.7 C) (Oral)   Resp 20   SpO2 98%   Physical Exam Constitutional:      General: She is not in acute distress.    Appearance: Normal appearance. She is well-developed. She is obese. She is not ill-appearing, toxic-appearing or diaphoretic.  HENT:     Head: Normocephalic and atraumatic.     Nose: Nose normal.      Mouth/Throat:     Mouth: Mucous membranes are moist.     Pharynx: Oropharynx is clear.  Eyes:     General: No scleral icterus.    Extraocular Movements: Extraocular movements intact.     Pupils: Pupils are equal, round, and reactive to light.  Cardiovascular:  Rate and Rhythm: Normal rate.  Pulmonary:     Effort: Pulmonary effort is normal.  Abdominal:     Tenderness: There is no right CVA tenderness or left CVA tenderness.  Skin:    General: Skin is warm and dry.  Neurological:     General: No focal deficit present.     Mental Status: She is alert and oriented to person, place, and time.  Psychiatric:        Mood and Affect: Mood normal.        Behavior: Behavior normal.        Thought Content: Thought content normal.        Judgment: Judgment normal.     Results for orders placed or performed during the hospital encounter of 07/08/20 (from the past 24 hour(s))  POCT urinalysis dipstick     Status: Abnormal   Collection Time: 07/08/20  5:59 PM  Result Value Ref Range   Color, UA yellow yellow   Clarity, UA cloudy (A) clear   Glucose, UA =100 (A) negative mg/dL   Bilirubin, UA negative negative   Ketones, POC UA negative negative mg/dL   Spec Grav, UA 4.196 2.229 - 1.025   Blood, UA trace-lysed (A) negative   pH, UA 7.0 5.0 - 8.0   Protein Ur, POC =100 (A) negative mg/dL   Urobilinogen, UA 1.0 0.2 or 1.0 E.U./dL   Nitrite, UA Positive (A) Negative   Leukocytes, UA Large (3+) (A) Negative  POCT urine pregnancy     Status: None   Collection Time: 07/08/20  6:00 PM  Result Value Ref Range   Preg Test, Ur Negative Negative    Assessment and Plan :   PDMP not reviewed this encounter.  1. Acute cystitis with hematuria     Start Keflex to cover for acute cystitis, urine culture pending.  Recommended aggressive hydration, limiting urinary irritants. Counseled patient on potential for adverse effects with medications prescribed/recommended today, ER and  return-to-clinic precautions discussed, patient verbalized understanding.    Wallis Bamberg, [place] 07/08/20 1801

## 2020-07-08 NOTE — ED Triage Notes (Signed)
Pt presents with painful and frequent urination x 1.5 weeks.

## 2020-07-08 NOTE — Discharge Instructions (Signed)

## 2020-07-12 LAB — URINE CULTURE: Culture: >=100000 — AB

## 2020-07-21 ENCOUNTER — Other Ambulatory Visit: Payer: Self-pay

## 2020-07-21 ENCOUNTER — Ambulatory Visit
Admission: EM | Admit: 2020-07-21 | Discharge: 2020-07-21 | Disposition: A | Discharge status: Home / Self Care | Payer: Medicaid Other | Attending: Emergency Medicine | Admitting: Emergency Medicine

## 2020-07-21 DIAGNOSIS — N760 Acute vaginitis: Secondary | ICD-10-CM | POA: Diagnosis not present

## 2020-07-21 DIAGNOSIS — N390 Urinary tract infection, site not specified: Secondary | ICD-10-CM | POA: Diagnosis not present

## 2020-07-21 LAB — POCT URINALYSIS DIP (MANUAL ENTRY)
Bilirubin, UA: NEGATIVE
Glucose, UA: 100 mg/dL — AB
Ketones, POC UA: NEGATIVE mg/dL
Nitrite, UA: POSITIVE — AB
Protein Ur, POC: 30 mg/dL — AB
Spec Grav, UA: >=1.03 — AB (ref 1.010–1.025)
Urobilinogen, UA: 1 E.U./dL
pH, UA: 6 (ref 5.0–8.0)

## 2020-07-21 LAB — POCT URINE PREGNANCY: Preg Test, Ur: NEGATIVE

## 2020-07-21 MED ORDER — FLUCONAZOLE 150 MG PO TABS: 150.0000 mg | ORAL_TABLET | Freq: Once | ORAL | 0 refills | Status: AC

## 2020-07-21 MED ORDER — SULFAMETHOXAZOLE-TRIMETHOPRIM 800-160 MG PO TABS: 1.0000 | ORAL_TABLET | Freq: Two times a day (BID) | ORAL | 0 refills | Status: AC

## 2020-07-21 NOTE — Discharge Instructions (Signed)
Begin Bactrim twice daily for 1 week Repeat urine culture pending 1 Diflucan today for yeast infection, may repeat after completion of Bactrim if still having itching Follow-up with OB/GYN as planned Drink plenty of fluids Follow-up for any persistent or worsening symptoms

## 2020-07-21 NOTE — ED Triage Notes (Signed)
Patient states she had a uti and was treated here previously but symptoms returned 2 days ago and she is having urinary frequency and burning. Pt states she finished her course of antibiotics from the previous visit and it did improve.

## 2020-07-21 NOTE — ED Provider Notes (Signed)
[name]    [name]: [id number] Arrival date & time: 07/21/20  1730      History   Chief Complaint Chief Complaint  Patient presents with  . Urinary Frequency    X 2 days  . Dysuria    X 2 days    HPI Cassandra Tucker is a 30 y.o. female presenting today for evaluation of possible UTI.  Reports 2 days of urinary frequency urgency and dysuria.  Reports associated vaginal itching and she is also concerned about possible yeast infection. Patient was seen here on 1/27 for UTI; urine culture positive and was treated for Keflex.  HPI  Past Medical History:  Diagnosis Date  . Ankle fracture, left 2000  . Anxiety   . Arthritis    left wrist and left ankle  . Asthma   . Back pain   . Back pain   . Bronchitis   . Depression   . Diabetes (HCC) 01/11/2016   type II  history of  . Family history of adverse reaction to anesthesia    gmother had problems with N/V   . GERD (gastroesophageal reflux disease)   . Headache   . High cholesterol   . History of kidney stones   . HLD (hyperlipidemia)   . HSV-2 infection   . Hypertension 2013  . Intracranial hypertension    psuedotumor cebrum  . Joint pain   . Joint pain   . Kienbock's disease   . Leg edema   . OSA (obstructive sleep apnea)    cpap - does not know settings   . Papilledema   . Prediabetes   . Pregnancy induced hypertension   . Pseudotumor cerebri syndrome   . RLS (restless legs syndrome)   . Sinus tachycardia   . SOB (shortness of breath)     Patient Active Problem List   Diagnosis Date Noted  . Hyperlipidemia, mixed 03/03/2020  . Severe persistent asthma without complication 02/19/2020  . Vaccine counseling 02/19/2020  . Seasonal and perennial allergic rhinoconjunctivitis 01/01/2020  . Postpartum anemia 07/20/2019  . Postpartum care following cesarean delivery 2/4 07/18/2019  . Cesarean delivery -  IOL with fetal intolerance of labor and FTP 07/18/2019  . Marginal insertion of umbilical cord  affecting management of mother 07/16/2019  . Chronic hypertension affecting pregnancy 07/01/2019  . Pregnancy 06/29/2019  . BMI 45.0-49.9, adult (HCC) 06/29/2019  . Pregnancy-induced hypertension 06/26/2019  . Anemia 05/21/2019  . Asthma, not well controlled, unspecified asthma severity, uncomplicated 05/18/2019  . Maternal obesity affecting pregnancy, antepartum 01/28/2019  . History of depression 01/28/2019  . Anxiety 01/26/2019  . History of bariatric surgery 01/13/2019  . Genital herpes simplex 01/13/2019  . Pregnant 12/25/2018  . Vitamin D deficiency 02/26/2017  . Pre-diabetes 02/26/2017  . Class 3 obesity with serious comorbidity and body mass index (BMI) of 60.0 to 69.9 in adult 02/26/2017  . Controlled type 2 diabetes mellitus without complication, without long-term current use of insulin (HCC) 04/21/2016  . Gastroesophageal reflux disease without esophagitis 04/21/2016  . Pseudotumor cerebri syndrome 11/29/2015  . Super obesity 11/29/2015  . OSA on CPAP 11/29/2015  . RLS (restless legs syndrome) 11/29/2015  . Papilledema 06/16/2015  . Pain in the wrist 04/28/2014  . 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 Elevated cholesterol 01/21/2014  . Sinus tachycardia 12/04/2011  . Essential hypertension 12/04/2011  . Hypertension 12/04/2011  . Morbid obesity (HCC) 11/07/2011    Past Surgical History:  Procedure Laterality Date  . CESAREAN SECTION  N/A 07/17/2019   Procedure: CESAREAN SECTION;  Surgeon: Osborn Coho, MD;  Location: [facility];  Service: Obstetrics;  Laterality: N/A;  . FRACTURE SURGERY  2005   ankle  . FRACTURE SURGERY  2013   wrist (deteriorating bone)  . LAPAROSCOPIC GASTRIC SLEEVE RESECTION N/A 05/08/2017   Procedure: LAPAROSCOPIC GASTRIC SLEEVE RESECTION;  Surgeon: Berna Bue, MD;  Location: [facility];  Service: General;  Laterality: N/A;  . LAPAROSCOPIC GASTRIC SLEEVE RESECTION  05/08/2017  . UPPER GI ENDOSCOPY  05/08/2017   Procedure: UPPER GI  ENDOSCOPY;  Surgeon: Berna Bue, MD;  Location: [facility];  Service: General;;  . urethra stretch    . WRIST SURGERY  2015    OB History    Gravida  1   Para  1   Term  1   Preterm  0   AB  0   Living  1     SAB  0   IAB  0   Ectopic  0   Multiple  0   Live Births  1            Home Medications    Prior to Admission medications   Medication Sig Start Date End Date Taking? Authorizing Provider  FASENRA PEN 30 MG/ML SOAJ INJECT 1 PEN UNDER THE SKIN AT WEEK 0, 4 AND 8, THEN INJECT 1 PEN EVERY 8 WEEKS THEREAFTER. 06/02/20  Yes Ellamae Sia, DO  fluconazole (DIFLUCAN) 150 MG tablet Take 1 tablet (150 mg total) by mouth once for 1 dose. 07/21/20 07/21/20 Yes Tyric Rodeheaver C, PA-C  fluticasone (FLONASE) 50 MCG/ACT nasal spray Place 2 sprays into both nostrils daily. 10/03/19  Yes Martin, Mary-Margaret, FNP  Insulin Pen Needle 31G X 5 MM MISC 1 each by Does not apply route daily. Use one pen needle daily to inject Victoza. 04/01/20  Yes Beasley, Caren D, MD  levonorgestrel (MIRENA, 52 MG,) 20 MCG/24HR IUD Mirena 20 mcg/24 hours (6 yrs) 52 mg intrauterine device  Take 1 device by intrauterine route.   Yes [provider]  sulfamethoxazole-trimethoprim (BACTRIM DS) 800-160 MG tablet Take 1 tablet by mouth 2 (two) times daily for 7 days. 07/21/20 07/28/20 Yes Febe Champa C, PA-C  VENTOLIN HFA 108 (90 Base) MCG/ACT inhaler INHALE 2 PUFFS INTO THE LUNGS EVERY 6 HOURS AS NEEDED FOR WHEEZING OR SHORTNESS OF BREATH 06/21/20   Ellamae Sia, DO  Vitamin D, Ergocalciferol, (DRISDOL) 1.25 MG (50000 UNIT) CAPS capsule Take 1 capsule (50,000 Units total) by mouth every 7 (seven) days. 03/04/20  Yes Beasley, Caren D, MD  albuterol (PROVENTIL) (2.5 MG/3ML) 0.083% nebulizer solution Take 3 mLs (2.5 mg total) by nebulization every 4 (four) hours as needed for wheezing or shortness of breath. 02/19/20   Ellamae Sia, DO  budesonide-formoterol (SYMBICORT) 160-4.5 MCG/ACT inhaler Inhale 2  puffs into the lungs in the morning and at bedtime. with spacer and rinse mouth afterwards. 01/08/20   Ellamae Sia, DO  chlorthalidone (HYGROTON) 25 MG tablet Take 1 tablet (25 mg total) by mouth daily. 04/14/20   Quillian Quince D, MD  escitalopram (LEXAPRO) 10 MG tablet Take 1 tablet (10 mg total) by mouth daily. 04/14/20   Quillian Quince D, MD  liraglutide (VICTOZA) 18 MG/3ML SOPN Inject 0.6 mg into the skin daily. Inject 0.6mg  under the skin once daily. 05/05/20   Quillian Quince D, MD  loratadine (CLARITIN) 10 MG tablet Take 10 mg by mouth daily. 03/27/20   [provider]  meloxicam (MOBIC) 15 MG tablet Take 1 tablet (15 mg total) by mouth daily. 04/09/20   Felecia Shelling, DPM  NIFEdipine (ADALAT CC) 60 MG 24 hr tablet Take 60 mg by mouth every morning.     [provider]  cetirizine (ZYRTEC) 10 MG tablet Take 10 mg by mouth daily. 03/27/20 07/21/20  [provider]    Family History Family History  Problem Relation Age of Onset  . Heart attack Mother   . Hypertension Mother   . Obesity Mother   . Depression Mother   . Anxiety disorder Mother   . Bipolar disorder Mother   . Allergic rhinitis Mother   . Sleep apnea Mother   . Cancer Father   . Allergic rhinitis Father   . Depression Father   . Alcoholism Father   . Allergic rhinitis Brother   . Allergic rhinitis Maternal Grandmother   . Migraines Neg Hx   . Asthma Neg Hx   . Eczema Neg Hx   . Urticaria Neg Hx     Social History Social History   Tobacco Use  . Smoking status: Former Smoker    Packs/day: 1.00    Types: Cigarettes    Quit date: 10/05/2018    Years since quitting: 1.7  . Smokeless tobacco: Never Used  Vaping Use  . Vaping Use: Never used  Substance Use Topics  . Alcohol use: Yes    Alcohol/week: 0.0 standard drinks    Comment: socially  . Drug use: Yes    Types: Marijuana    Comment: early in pregnancy for nausea     Allergies   Cats claw (uncaria tomentosa), Dust mite  extract, Grass pollen(k-o-r-t-swt vern), and Mixed feathers   Review of Systems Review of Systems  Constitutional: Negative for fever.  Respiratory: Negative for shortness of breath.   Cardiovascular: Negative for chest pain.  Gastrointestinal: Negative for abdominal pain, diarrhea, nausea and vomiting.  Genitourinary: Positive for dysuria. Negative for flank pain, genital sores, hematuria, menstrual problem, vaginal bleeding, vaginal discharge and vaginal pain.  Musculoskeletal: Negative for back pain.  Skin: Negative for rash.  Neurological: Negative for dizziness, light-headedness and headaches.     Physical Exam Triage Vital Signs ED Triage Vitals  Enc Vitals Group     BP      Pulse      Resp      Temp      Temp src      SpO2      Weight      Height      Head Circumference      Peak Flow      Pain Score      Pain Loc      Pain Edu?      Excl. in GC?    No data found.  Updated Vital Signs Pulse (!) 113   Temp 98.5 F (36.9 C) (Oral)   Resp 18   SpO2 97%   Visual Acuity Right Eye Distance:   Left Eye Distance:   Bilateral Distance:    Right Eye Near:   Left Eye Near:    Bilateral Near:     Physical Exam Vitals and nursing note reviewed.  Constitutional:      Appearance: She is well-developed and well-nourished.     Comments: No acute distress  HENT:     Head: Normocephalic and atraumatic.     Nose: Nose normal.  Eyes:     Conjunctiva/sclera: Conjunctivae normal.  Cardiovascular:     Rate and Rhythm: Normal rate.  Pulmonary:     Effort: Pulmonary effort is normal. No respiratory distress.  Abdominal:     General: There is no distension.  Musculoskeletal:        General: Normal range of motion.     Cervical back: Neck supple.  Skin:    General: Skin is warm and dry.  Neurological:     Mental Status: She is alert and oriented to person, place, and time.  Psychiatric:        Mood and Affect: Mood and affect normal.      UC Treatments /  Results  Labs (all labs ordered are listed, but only abnormal results are displayed) Labs Reviewed  POCT URINALYSIS DIP (MANUAL ENTRY) - Abnormal; Notable for the following components:      Result Value   Clarity, UA cloudy (*)    Glucose, UA =100 (*)    Spec Grav, UA >=1.030 (*)    Blood, UA trace-intact (*)    Protein Ur, POC =30 (*)    Nitrite, UA Positive (*)    Leukocytes, UA Trace (*)    All other components within normal limits  URINE CULTURE  POCT URINE PREGNANCY    EKG   Radiology No results found.  Procedures Procedures (including critical care time)  Medications Ordered in UC Medications - No data to display  Initial Impression / Assessment and Plan / UC Course  I have reviewed the triage vital signs and the nursing notes.  Pertinent labs & imaging results that were available during my care of the patient were reviewed by me and considered in my medical decision making (see chart for details).     UA with positive nitrites, trace leuks, urine culture pending, repeating treatment for UTI with Bactrim twice daily x1 week, also provided Diflucan to cover for yeast.  Patient declined vaginal swab today and has plans to follow-up with OB/GYN on Tuesday.  Discussed strict return precautions. Patient verbalized understanding and is agreeable with plan.  Final Clinical Impressions(s) / UC Diagnoses   Final diagnoses:  Lower urinary tract infection, acute  Vaginitis and vulvovaginitis     Discharge Instructions     Begin Bactrim twice daily for 1 week Repeat urine culture pending 1 Diflucan today for yeast infection, may repeat after completion of Bactrim if still having itching Follow-up with OB/GYN as planned Drink plenty of fluids Follow-up for any persistent or worsening symptoms    ED Prescriptions    Medication Sig Dispense Auth. Provider   sulfamethoxazole-trimethoprim (BACTRIM DS) 800-160 MG tablet Take 1 tablet by mouth 2 (two) times daily for  7 days. 14 tablet Magdalynn Davilla C, PA-C   fluconazole (DIFLUCAN) 150 MG tablet Take 1 tablet (150 mg total) by mouth once for 1 dose. 2 tablet Andie Mortimer, [place] C, PA-C     PDMP not reviewed this encounter.   Lew Dawes, [place] 07/21/20 1839

## 2020-07-22 ENCOUNTER — Encounter: Payer: Self-pay | Admitting: Allergy

## 2020-07-24 DIAGNOSIS — Z8759 Personal history of other complications of pregnancy, childbirth and the puerperium: Secondary | ICD-10-CM | POA: Insufficient documentation

## 2020-07-24 DIAGNOSIS — Z98891 History of uterine scar from previous surgery: Secondary | ICD-10-CM | POA: Insufficient documentation

## 2020-07-25 LAB — URINE CULTURE: Culture: >=100000 — AB

## 2020-07-27 ENCOUNTER — Ambulatory Visit: Payer: Medicaid Other | Admitting: Allergy

## 2020-08-03 ENCOUNTER — Ambulatory Visit: Payer: Medicaid Other | Admitting: Allergy

## 2020-08-03 NOTE — Progress Notes (Deleted)
Follow Up Note  RE: Cassandra Tucker MRN: [id number] DOB: 1990-08-13 Date of Office Visit: 08/03/2020  Referring provider: [name], * Primary care provider: [name], FNP  Chief Complaint: No chief complaint on file.  History of Present Illness: I had the pleasure of seeing [name] for a follow up visit at the [facility] on 08/03/2020. She is a 30 y.o. female, who is being followed for asthma, allergic rhinoconjunctivitis. Her previous allergy office visit was on 05/20/2020 with Dr. Selena Batten via telemedicine. Today is a regular follow up visit.  Severe persistent asthma without complication Past history - Symptoms of chest tightness, shortness of breath, coughing, wheezing, nocturnal awakenings for 1 year. History of reflux but no longer on PPI. Ex-smoker. 2021 spirometry showed: normal pattern and 13% improvement in FEV1 post bronchodilator treatment. Clinically feeling better. Interim history - doing much better since started [place]. Stopped all maintenance inhalers and Singulair for 1 month now.   Continue Fasenra injections at home.  Daily controller medication(s):restart Symbicort 2 puffs twice a day with spacer and rinse mouth afterwards.  STOP Spiriva 1.95mcg 2 puffs once a day.  STOP Singulair (montelukast) 10mg  daily at night.  May use albuterol rescue inhaler 2 puffs or nebulizer every 4 to 6 hours as needed for shortness of breath, chest tightness, coughing, and wheezing. May use albuterol rescue inhaler 2 puffs 5 to 15 minutes prior to strenuous physical activities. Monitor frequency of use.   During upper respiratory infections/asthma flares: start Alvesco 1 puffs twice a day with spacer and rinse mouth afterwards for 1-2 weeks until your breathing symptoms return to baseline.   Repeat spirometry at next visit.  Get sleep study done - see PCP for this.   Recommend getting COVID-19  vaccination.  Seasonal and perennial allergic rhinoconjunctivitis Past history - Perennial rhino conjunctivitis symptoms for 20+ years with worsening in the spring. No previous ENT evaluation. Tried Claritin, Flonase and Zyrtec with some benefit. 2021 skin testing showed: Positive to grass, dust mites, cats, feathers. Negative to common foods.  Interim history - dry nares.  Continue environmental control measures as below.  May use over the counter antihistamines such as Zyrtec (cetirizine), Claritin (loratadine), Allegra (fexofenadine), or Xyzal (levocetirizine) daily. May take it twice a day if needed.   ONLY use Flonase 2 sprays per nostril max per day.   STOP azelastine for now as it can make the dryness worse.  Vaccine counseling  Patient is hesitant about the vaccine as some of her family/friends had some side effects from it.   Patient has no known history of reactions to vaccines in the past.   She does not meet medical criteria for vaccine exemption and told patient that I can't write a letter for her personal beliefs.   Recommend getting the COVID-19 vaccine.   Return in about 2 months (around 07/21/2020).  Assessment and Plan: [name] is a 30 y.o. female with: No problem-specific Assessment & Plan notes found for this encounter.  No follow-ups on file.  No orders of the defined types were placed in this encounter.  Lab Orders  No laboratory test(s) ordered today    Diagnostics: Spirometry:  Tracings reviewed. Her effort: {Blank single:19197::"Good reproducible efforts.","It was hard to get consistent efforts and there is a question as to whether this reflects a maximal maneuver.","Poor effort, data can not be interpreted."} FVC: ***L FEV1: ***L, ***% predicted FEV1/FVC ratio: ***% Interpretation: {Blank single:19197::"Spirometry consistent with mild obstructive disease","Spirometry  consistent with moderate obstructive disease","Spirometry consistent with  severe obstructive disease","Spirometry consistent with possible restrictive disease","Spirometry consistent with mixed obstructive and restrictive disease","Spirometry uninterpretable due to technique","Spirometry consistent with normal pattern","No overt abnormalities noted given today's efforts"}.  Please see scanned spirometry results for details.  Skin Testing: {Blank single:19197::"Select foods","Environmental allergy panel","Environmental allergy panel and select foods","Food allergy panel","None","Deferred due to recent antihistamines use"}. Positive test to: ***. Negative test to: ***.  Results discussed with patient/family.   Medication List:  Current Outpatient Medications  Medication Sig Dispense Refill  . VENTOLIN HFA 108 (90 Base) MCG/ACT inhaler INHALE 2 PUFFS INTO THE LUNGS EVERY 6 HOURS AS NEEDED FOR WHEEZING OR SHORTNESS OF BREATH 18 g 1  . albuterol (PROVENTIL) (2.5 MG/3ML) 0.083% nebulizer solution Take 3 mLs (2.5 mg total) by nebulization every 4 (four) hours as needed for wheezing or shortness of breath. 75 mL 1  . budesonide-formoterol (SYMBICORT) 160-4.5 MCG/ACT inhaler Inhale 2 puffs into the lungs in the morning and at bedtime. with spacer and rinse mouth afterwards. 1 Inhaler 5  . chlorthalidone (HYGROTON) 25 MG tablet Take 1 tablet (25 mg total) by mouth daily. 30 tablet 0  . escitalopram (LEXAPRO) 10 MG tablet Take 1 tablet (10 mg total) by mouth daily. 30 tablet 0  . FASENRA PEN 30 MG/ML SOAJ INJECT 1 PEN UNDER THE SKIN AT WEEK 0, 4 AND 8, THEN INJECT 1 PEN EVERY 8 WEEKS THEREAFTER. 1 mL 7  . fluticasone (FLONASE) 50 MCG/ACT nasal spray Place 2 sprays into both nostrils daily. 16 g 6  . Insulin Pen Needle 31G X 5 MM MISC 1 each by Does not apply route daily. Use one pen needle daily to inject Victoza. 100 each 0  . levonorgestrel (MIRENA, 52 MG,) 20 MCG/24HR IUD Mirena 20 mcg/24 hours (6 yrs) 52 mg intrauterine device  Take 1 device by intrauterine route.    .  liraglutide (VICTOZA) 18 MG/3ML SOPN Inject 0.6 mg into the skin daily. Inject 0.6mg  under the skin once daily. 3 mL 0  . loratadine (CLARITIN) 10 MG tablet Take 10 mg by mouth daily.    . meloxicam (MOBIC) 15 MG tablet Take 1 tablet (15 mg total) by mouth daily. 30 tablet 1  . NIFEdipine (ADALAT CC) 60 MG 24 hr tablet Take 60 mg by mouth every morning.     . Vitamin D, Ergocalciferol, (DRISDOL) 1.25 MG (50000 UNIT) CAPS capsule Take 1 capsule (50,000 Units total) by mouth every 7 (seven) days. 4 capsule 0   No current facility-administered medications for this visit.   Allergies: Allergies  Allergen Reactions  . Cats Claw (Uncaria Tomentosa) Itching, Shortness Of Breath and Swelling  . Dust Mite Extract Itching and Shortness Of Breath  . Grass Pollen(K-O-R-T-Swt Vern) Itching and Shortness Of Breath  . Mixed Feathers Itching, Shortness Of Breath and Swelling   I reviewed her past medical history, social history, family history, and environmental history and no significant changes have been reported from her previous visit.  Review of Systems  Constitutional: Negative for appetite change, chills, fever and unexpected weight change.  HENT: Negative for congestion, postnasal drip, rhinorrhea and sneezing.   Eyes: Negative for itching.  Respiratory: Negative for cough, chest tightness, shortness of breath and wheezing.   Cardiovascular: Negative for chest pain.  Gastrointestinal: Negative for abdominal pain.  Genitourinary: Negative for difficulty urinating.  Skin: Negative for rash.  Allergic/Immunologic: Positive for environmental allergies. Negative for food allergies.  Neurological: Negative for headaches.   Objective: There  were no vitals taken for this visit. There is no height or weight on file to calculate BMI. Physical Exam Vitals and nursing note reviewed.  Constitutional:      Appearance: Normal appearance. She is well-developed. She is obese.  HENT:     Head:  Normocephalic and atraumatic.     Right Ear: Tympanic membrane and external ear normal.     Left Ear: Tympanic membrane and external ear normal.     Nose: Nose normal.     Mouth/Throat:     Mouth: Mucous membranes are moist.     Pharynx: Oropharynx is clear.  Eyes:     Conjunctiva/sclera: Conjunctivae normal.  Cardiovascular:     Rate and Rhythm: Normal rate and regular rhythm.     Heart sounds: Normal heart sounds. No murmur heard. No friction rub. No gallop.   Pulmonary:     Effort: Pulmonary effort is normal.     Breath sounds: Wheezing present. No rhonchi or rales.  Musculoskeletal:     Cervical back: Neck supple.  Skin:    General: Skin is warm.     Findings: No rash.  Neurological:     Mental Status: She is alert and oriented to person, place, and time.  Psychiatric:        Behavior: Behavior normal.    Previous notes and tests were reviewed. The plan was reviewed with the patient/family, and all questions/concerned were addressed.  It was my pleasure to see Chenille today and participate in her care. Please feel free to contact me with any questions or concerns.  Sincerely,  Wyline Mood, DO Allergy & Immunology  [facility] office: [phone] [facility] office: [phone]

## 2020-08-24 ENCOUNTER — Other Ambulatory Visit: Payer: Self-pay

## 2020-08-24 ENCOUNTER — Encounter: Payer: Self-pay | Admitting: Allergy

## 2020-08-24 ENCOUNTER — Ambulatory Visit (INDEPENDENT_AMBULATORY_CARE_PROVIDER_SITE_OTHER): Payer: Medicaid Other | Admitting: Allergy

## 2020-08-24 VITALS — BP 154/84 | HR 84 | Resp 14 | Ht 67.0 in

## 2020-08-24 DIAGNOSIS — J4550 Severe persistent asthma, uncomplicated: Secondary | ICD-10-CM | POA: Diagnosis not present

## 2020-08-24 DIAGNOSIS — B354 Tinea corporis: Secondary | ICD-10-CM | POA: Diagnosis not present

## 2020-08-24 DIAGNOSIS — Z30431 Encounter for routine checking of intrauterine contraceptive device: Secondary | ICD-10-CM | POA: Diagnosis not present

## 2020-08-24 DIAGNOSIS — R399 Unspecified symptoms and signs involving the genitourinary system: Secondary | ICD-10-CM | POA: Diagnosis not present

## 2020-08-24 DIAGNOSIS — J302 Other seasonal allergic rhinitis: Secondary | ICD-10-CM

## 2020-08-24 DIAGNOSIS — H1013 Acute atopic conjunctivitis, bilateral: Secondary | ICD-10-CM | POA: Diagnosis not present

## 2020-08-24 DIAGNOSIS — Z0000 Encounter for general adult medical examination without abnormal findings: Secondary | ICD-10-CM | POA: Diagnosis not present

## 2020-08-24 DIAGNOSIS — L304 Erythema intertrigo: Secondary | ICD-10-CM | POA: Diagnosis not present

## 2020-08-24 DIAGNOSIS — Z6841 Body Mass Index (BMI) 40.0 and over, adult: Secondary | ICD-10-CM | POA: Diagnosis not present

## 2020-08-24 DIAGNOSIS — H1010 Acute atopic conjunctivitis, unspecified eye: Secondary | ICD-10-CM

## 2020-08-24 DIAGNOSIS — E65 Localized adiposity: Secondary | ICD-10-CM | POA: Diagnosis not present

## 2020-08-24 DIAGNOSIS — N898 Other specified noninflammatory disorders of vagina: Secondary | ICD-10-CM | POA: Diagnosis not present

## 2020-08-24 DIAGNOSIS — J3089 Other allergic rhinitis: Secondary | ICD-10-CM | POA: Diagnosis not present

## 2020-08-24 MED ORDER — ALBUTEROL SULFATE HFA 108 (90 BASE) MCG/ACT IN AERS: 2.0000 | INHALATION_SPRAY | RESPIRATORY_TRACT | 2 refills | Status: DC | PRN

## 2020-08-24 NOTE — Addendum Note (Signed)
Addended by: Berna Bue on: 08/24/2020 01:40 PM   Modules accepted: Orders

## 2020-08-24 NOTE — Assessment & Plan Note (Signed)
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 No previous ENT evaluation. Tried Claritin, Flonase and Zyrtec with some benefit. 2021 skin testing showed: Positive to grass, dust mites, cats, feathers. Negative to common foods.  Interim history - dry nares, [place] but [place].  Continue environmental control measures as below.  Continue Singulair (montelukast) 10mg  daily at night.  May use over the counter antihistamines such as Zyrtec (cetirizine), Claritin (loratadine), Allegra (fexofenadine), or Xyzal (levocetirizine) daily. May take it twice a day if needed.   May use Flonase (fluticasone) nasal spray 1 spray per nostril twice a day as needed for nasal congestion.   Read about allergy injections - handout given.   This may be given at local PCP's office if they are willing to administer.

## 2020-08-24 NOTE — Progress Notes (Signed)
Follow Up Note  RE: Cassandra Tucker MRN: [id number] DOB: Jan 11, 1991 Date of Office Visit: 08/24/2020  Referring provider: [name], * Primary care provider: [name], FNP  Chief Complaint: Asthma (Doing pretty good)  History of Present Illness: I had the pleasure of seeing Cassandra Tucker for a follow up visit at the [facility] on 08/24/2020. She is a 30 y.o. female, who is being followed for asthma, allergic rhinoconjunctivitis. Her previous allergy office visit was on 05/20/2020 with Dr. Selena Batten via telemedicine. Today is a regular follow up visit.  Severe persistent asthma Denies any SOB, coughing, wheezing, chest tightness, nocturnal awakenings, ER/urgent care visits or prednisone use since the last visit. Patient is self injecting Fasenra at home every 8 weeks with good benefit. Currently not taking any daily inhalers. Taking montelukast 10mg  daily.   No albuterol use for the past few months.  Did not get sleep study done - sleeping much better since asthma is controlled.   Seasonal and perennial allergic rhinoconjunctivitis Taking Singulair daily with some benefit. Takes Flonase as needed with good benefit.   Having some rhinorrhea, sneezing, nasal congestion.  Interested in starting AIT but lives 45 minutes away from the office.   Patient got pfizer vaccines.  Assessment and Plan: Cassandra Tucker is a 30 y.o. female with: Severe persistent asthma without complication Past history - Symptoms of chest tightness, shortness of breath, coughing, wheezing, nocturnal awakenings for 1 year. History of reflux but no longer on PPI. Ex-smoker. 2021 spirometry showed: normal pattern and 13% improvement in FEV1 post bronchodilator treatment. Clinically feeling better. Interim history - not using any daily maintenance inhalers. Well-controlled with Fasenra injections.  . Today's spirometry showed some mild restriction most likely due to body  habitus.  . Continue Fasenra injections at home every 8 weeks.  Daily controller medication(s):  Singulair (montelukast) 10mg  daily at night. May use albuterol rescue inhaler 2 puffs or nebulizer every 4 to 6 hours as needed for shortness of breath, chest tightness, coughing, and wheezing. May use albuterol rescue inhaler 2 puffs 5 to 15 minutes prior to strenuous physical activities. Monitor frequency of use.  During upper respiratory infections/asthma flares: start Symbicort 2022 2 puffs twice a day with spacer and rinse mouth afterwards for 1-2 weeks until your breathing symptoms return to baseline.  Get spirometry at next visit.   Seasonal and perennial allergic rhinoconjunctivitis Past history - Perennial rhino conjunctivitis symptoms for 20+ years with worsening in the spring. No previous ENT evaluation. Tried Claritin, Flonase and Zyrtec with some benefit. 2021 skin testing showed: Positive to grass, dust mites, cats, feathers. Negative to common foods.  Interim history - dry nares, interested in AIT but lives 45 min away.  Continue environmental control measures as below.  Continue Singulair (montelukast) 10mg  daily at night.  May use over the counter antihistamines such as Zyrtec (cetirizine), Claritin (loratadine), Allegra (fexofenadine), or Xyzal (levocetirizine) daily. May take it twice a day if needed.   May use Flonase (fluticasone) nasal spray 1 spray per nostril twice a day as needed for nasal congestion.   Read about allergy injections - handout given.   This may be given at local PCP's office if they are willing to administer.   Return in about 4 months (around 12/24/2020).  Meds ordered this encounter  Medications  . albuterol (VENTOLIN HFA) 108 (90 Base) MCG/ACT inhaler    Sig: Inhale 2 puffs into the lungs every 4 (four) hours as needed for wheezing or  shortness of breath (coughing fits).    Dispense:  8 g    Refill:  2   Lab Orders  No laboratory test(s)  ordered today    Diagnostics: Spirometry:  Tracings reviewed. Her effort: Good reproducible efforts. FVC: 3.11L FEV1: 2.65L, 76% predicted FEV1/FVC ratio: 85% Interpretation: Spirometry consistent with possible restrictive disease.  Please see scanned spirometry results for details.  Medication List:  Current Outpatient Medications  Medication Sig Dispense Refill  . albuterol (PROVENTIL) (2.5 MG/3ML) 0.083% nebulizer solution Take 3 mLs (2.5 mg total) by nebulization every 4 (four) hours as needed for wheezing or shortness of breath. 75 mL 1  . albuterol (VENTOLIN HFA) 108 (90 Base) MCG/ACT inhaler Inhale 2 puffs into the lungs every 4 (four) hours as needed for wheezing or shortness of breath (coughing fits). 8 g 2  . budesonide-formoterol (SYMBICORT) 160-4.5 MCG/ACT inhaler Inhale 2 puffs into the lungs in the morning and at bedtime. with spacer and rinse mouth afterwards. 1 Inhaler 5  . FASENRA PEN 30 MG/ML SOAJ INJECT 1 PEN UNDER THE SKIN AT WEEK 0, 4 AND 8, THEN INJECT 1 PEN EVERY 8 WEEKS THEREAFTER. 1 mL 7  . fluticasone (FLONASE) 50 MCG/ACT nasal spray Place 2 sprays into both nostrils daily. 16 g 6  . ipratropium-albuterol (DUONEB) 0.5-2.5 (3) MG/3ML SOLN ipratropium 0.5 mg-albuterol 3 mg (2.5 mg base)/3 mL nebulization soln  USE 1 VIAL VIA NEBULIZER EVERY 6 HOURS AS NEEDED    . levonorgestrel (MIRENA, 52 MG,) 20 MCG/24HR IUD Mirena 20 mcg/24 hours (6 yrs) 52 mg intrauterine device  Take 1 device by intrauterine route.    . loratadine (CLARITIN) 10 MG tablet Take 10 mg by mouth daily.    . montelukast (SINGULAIR) 10 MG tablet Take 10 mg by mouth at bedtime.    . VENTOLIN HFA 108 (90 Base) MCG/ACT inhaler INHALE 2 PUFFS INTO THE LUNGS EVERY 6 HOURS AS NEEDED FOR WHEEZING OR SHORTNESS OF BREATH 18 g 1  . NIFEdipine (ADALAT CC) 60 MG 24 hr tablet Take 60 mg by mouth every morning.      No current facility-administered medications for this visit.   Allergies: Allergies  Allergen  Reactions  . Cats Claw (Uncaria Tomentosa) Itching, Shortness Of Breath and Swelling  . Dust Mite Extract Itching and Shortness Of Breath  . Grass Pollen(K-O-R-T-Swt Vern) Itching and Shortness Of Breath  . Mixed Feathers Itching, Shortness Of Breath and Swelling   I reviewed her past medical history, social history, family history, and environmental history and no significant changes have been reported from her previous visit.  Review of Systems  Constitutional: Negative for appetite change, chills, fever and unexpected weight change.  HENT: Negative for congestion, postnasal drip, rhinorrhea and sneezing.   Eyes: Negative for itching.  Respiratory: Negative for cough, chest tightness, shortness of breath and wheezing.   Cardiovascular: Negative for chest pain.  Gastrointestinal: Negative for abdominal pain.  Genitourinary: Negative for difficulty urinating.  Skin: Negative for rash.  Allergic/Immunologic: Positive for environmental allergies. Negative for food allergies.  Neurological: Negative for headaches.   Objective: BP (!) 154/84 (BP Location: Right Arm, Patient Position: Sitting, Cuff Size: Large)   Pulse 84   Resp 14   Ht 5\' 7"  (1.702 m)   SpO2 100%   BMI 50.12 kg/m  Body mass index is 50.12 kg/m. Physical Exam Vitals and nursing note reviewed.  Constitutional:      Appearance: Normal appearance. She is well-developed. She is obese.  HENT:  Head: Normocephalic and atraumatic.     Right Ear: Tympanic membrane and external ear normal.     Left Ear: Tympanic membrane and external ear normal.     Nose: Nose normal.     Mouth/Throat:     Mouth: Mucous membranes are moist.     Pharynx: Oropharynx is clear.  Eyes:     Conjunctiva/sclera: Conjunctivae normal.  Cardiovascular:     Rate and Rhythm: Normal rate and regular rhythm.     Heart sounds: Normal heart sounds. No murmur heard. No friction rub. No gallop.   Pulmonary:     Effort: Pulmonary effort is normal.      Breath sounds: No wheezing, rhonchi or rales.  Musculoskeletal:     Cervical back: Neck supple.  Skin:    General: Skin is warm.     Findings: No rash.  Neurological:     Mental Status: She is alert and oriented to person, place, and time.  Psychiatric:        Behavior: Behavior normal.    Previous notes and tests were reviewed. The plan was reviewed with the patient/family, and all questions/concerned were addressed.  It was my pleasure to see Cassandra Tucker today and participate in her care. Please feel free to contact me with any questions or concerns.  Sincerely,  Wyline Mood, DO Allergy & Immunology  [facility] office: [phone] [facility] office: [phone]

## 2020-08-24 NOTE — Assessment & Plan Note (Signed)
Past history - Symptoms of chest tightness, shortness of breath, coughing, wheezing, nocturnal awakenings for 1 year. History of reflux but no longer on PPI. Ex-smoker. 2021 spirometry showed: normal pattern and 13% improvement in FEV1 post bronchodilator treatment. Clinically feeling better. Interim history - not using any daily maintenance inhalers. Well-controlled with Fasenra injections.  . Today's spirometry showed some mild restriction most likely due to body habitus.  . Continue Fasenra injections at home every 8 weeks.  Daily controller medication(s):  Singulair (montelukast) 10mg  daily at night. May use albuterol rescue inhaler 2 puffs or nebulizer every 4 to 6 hours as needed for shortness of breath, chest tightness, coughing, and wheezing. May use albuterol rescue inhaler 2 puffs 5 to 15 minutes prior to strenuous physical activities. Monitor frequency of use.  During upper respiratory infections/asthma flares: start Symbicort 2 puffs twice a day with spacer and rinse mouth afterwards for 1-2 weeks until your breathing symptoms return to baseline.  Get spirometry at next visit.

## 2020-08-24 NOTE — Patient Instructions (Addendum)
Environmental allergies  Past skin testing showed: Positive to grass, dust mites, cats, feathers.  Continue environmental control measures as below.  Continue Singulair (montelukast) 10mg  daily at night.  May use over the counter antihistamines such as Zyrtec (cetirizine), Claritin (loratadine), Allegra (fexofenadine), or Xyzal (levocetirizine) daily. May take it twice a day if needed.   May use Flonase (fluticasone) nasal spray 1 spray per nostril twice a day as needed for nasal congestion.   Read about allergy injections - handout given.   This may be given at your local PCP's office if they are willing to administer.   Asthma: . Continue Fasenra injections at home every 8 weeks.  Daily controller medication(s):  Singulair (montelukast) 10mg  daily at night. May use albuterol rescue inhaler 2 puffs or nebulizer every 4 to 6 hours as needed for shortness of breath, chest tightness, coughing, and wheezing. May use albuterol rescue inhaler 2 puffs 5 to 15 minutes prior to strenuous physical activities. Monitor frequency of use.  During upper respiratory infections/asthma flares: start Symbicort 2 puffs twice a day with spacer and rinse mouth afterwards for 1-2 weeks until your breathing symptoms return to baseline.  Asthma control goals:  Full participation in all desired activities (may need albuterol before activity) Albuterol use two times or less a week on average (not counting use with activity) Cough interfering with sleep two times or less a month Oral steroids no more than once a year No hospitalizations  Follow up in 4 months or sooner if needed.  Reducing Pollen Exposure . Pollen seasons: trees (spring), grass (summer) and ragweed/weeds (fall). 06-17-1982 Keep windows closed in your home and car to lower pollen exposure.  10-19-1980 air conditioning in the bedroom and throughout the house if possible.  . Avoid going out in dry windy days - especially early morning. . Pollen  counts are highest between 5 - 10 AM and on dry, hot and windy days.  . Save outside activities for late afternoon or after a heavy rain, when pollen levels are lower.  . Avoid mowing of grass if you have grass pollen allergy. Marland Kitchen Be aware that pollen can also be transported indoors on people and pets.  . Dry your clothes in an automatic dryer rather than hanging them outside where they might collect pollen.  . Rinse hair and eyes before bedtime. Control of House Dust Mite Allergen . Dust mite allergens are a common trigger of allergy and asthma symptoms. While they can be found throughout the house, these microscopic creatures thrive in warm, humid environments such as bedding, upholstered furniture and carpeting. . Because so much time is spent in the bedroom, it is essential to reduce mite levels there.  . Encase pillows, mattresses, and box springs in special allergen-proof fabric covers or airtight, zippered plastic covers.  . Bedding should be washed weekly in hot water (130 F) and dried in a hot dryer. Allergen-proof covers are available for comforters and pillows that can't be regularly washed.  [name] the allergy-proof covers every few months. Minimize clutter in the bedroom. Keep pets out of the bedroom.  Marland Kitchen Keep humidity less than 50% by using a dehumidifier or air conditioning. You can buy a humidity measuring device called a hygrometer to monitor this.  . If possible, replace carpets with hardwood, linoleum, or washable area rugs. If that's not possible, vacuum frequently with a vacuum that has a HEPA filter. . Remove all upholstered furniture and non-washable window drapes from the bedroom. [name]  Remove all non-washable stuffed toys from the bedroom.  Wash stuffed toys weekly. Pet Allergen Avoidance: . Contrary to popular opinion, there are no "hypoallergenic" breeds of dogs or cats. That is because people are not allergic to an animal's hair, but to an allergen found in the animal's saliva,  dander (dead skin flakes) or urine. Pet allergy symptoms typically occur within minutes. For some people, symptoms can build up and become most severe 8 to 12 hours after contact with the animal. People with severe allergies can experience reactions in public places if dander has been transported on the pet owners' clothing. Marland Kitchen Keeping an animal outdoors is only a partial solution, since homes with pets in the yard still have higher concentrations of animal allergens. . Before getting a pet, ask your allergist to determine if you are allergic to animals. If your pet is already considered part of your family, try to minimize contact and keep the pet out of the bedroom and other rooms where you spend a great deal of time. . As with dust mites, vacuum carpets often or replace carpet with a hardwood floor, tile or linoleum. . High-efficiency particulate air (HEPA) cleaners can reduce allergen levels over time. . While dander and saliva are the source of cat and dog allergens, urine is the source of allergens from rabbits, hamsters, mice and [place] pigs; so ask a non-allergic family member to clean the animal's cage. . If you have a pet allergy, talk to your allergist about the potential for allergy immunotherapy (allergy shots). This strategy can often provide long-term relief.

## 2020-08-28 ENCOUNTER — Ambulatory Visit (INDEPENDENT_AMBULATORY_CARE_PROVIDER_SITE_OTHER): Payer: Medicaid Other

## 2020-08-28 ENCOUNTER — Other Ambulatory Visit: Payer: Self-pay

## 2020-08-28 ENCOUNTER — Encounter: Payer: Self-pay | Admitting: *Deleted

## 2020-08-28 ENCOUNTER — Ambulatory Visit
Admission: EM | Admit: 2020-08-28 | Discharge: 2020-08-28 | Disposition: A | Discharge status: Home / Self Care | Payer: Medicaid Other

## 2020-08-28 DIAGNOSIS — M79672 Pain in left foot: Secondary | ICD-10-CM | POA: Diagnosis not present

## 2020-08-28 DIAGNOSIS — M7989 Other specified soft tissue disorders: Secondary | ICD-10-CM | POA: Diagnosis not present

## 2020-08-28 DIAGNOSIS — S93602A Unspecified sprain of left foot, initial encounter: Secondary | ICD-10-CM

## 2020-08-28 DIAGNOSIS — S9032XA Contusion of left foot, initial encounter: Secondary | ICD-10-CM | POA: Diagnosis not present

## 2020-08-28 MED ORDER — NAPROXEN 500 MG PO TABS: 500.0000 mg | ORAL_TABLET | Freq: Two times a day (BID) | ORAL | 0 refills | Status: DC

## 2020-08-28 NOTE — ED Triage Notes (Signed)
Denies any known injury. States woke up 3 days ago with tenderness across dorsal aspect foot, along with a line of ecchymosis.  LLE CMS intact.

## 2020-08-28 NOTE — ED Provider Notes (Signed)
[name]    [name]: [id number] Arrival date & time: 08/28/20  1036      History   Chief Complaint Chief Complaint  Patient presents with  . Foot Pain    HPI Cassandra Tucker is a 30 y.o. female history of DM type II, presenting today for evaluation of flank pain.  Reports that she has had left foot pain since Wednesday with increased pain swelling and some bruising to her foot.  Denies any known specific injury or trauma to the foot.  Does report remote history of fracture in her left ankle.  Denies swelling into lower leg.  Denies history of DVT/PE.  Denies tobacco use.  HPI  Past Medical History:  Diagnosis Date  . Ankle fracture, left 2000  . Anxiety   . Arthritis    left wrist and left ankle  . Asthma   . Back pain   . Back pain   . Bronchitis   . Depression   . Diabetes (HCC) 01/11/2016   type II  history of  . Family history of adverse reaction to anesthesia    gmother had problems with N/V   . GERD (gastroesophageal reflux disease)   . Headache   . High cholesterol   . History of kidney stones   . HLD (hyperlipidemia)   . HSV-2 infection   . Hypertension 2013  . Intracranial hypertension    psuedotumor cebrum  . Joint pain   . Joint pain   . Kienbock's disease   . Leg edema   . OSA (obstructive sleep apnea)    cpap - does not know settings   . Papilledema   . Prediabetes   . Pregnancy induced hypertension   . Pseudotumor cerebri syndrome   . RLS (restless legs syndrome)   . Sinus tachycardia   . SOB (shortness of breath)     Patient Active Problem List   Diagnosis Date Noted  . Hyperlipidemia, mixed 03/03/2020  . Severe persistent asthma without complication 02/19/2020  . Seasonal and perennial allergic rhinoconjunctivitis 01/01/2020  . Postpartum anemia 07/20/2019  . Postpartum care following cesarean delivery 2/4 07/18/2019  . Cesarean delivery -  IOL with fetal intolerance of labor and FTP 07/18/2019  . Marginal insertion of  umbilical cord affecting management of mother 07/16/2019  . Chronic hypertension affecting pregnancy 07/01/2019  . Pregnancy 06/29/2019  . BMI 45.0-49.9, adult (HCC) 06/29/2019  . Pregnancy-induced hypertension 06/26/2019  . Anemia 05/21/2019  . Asthma, not well controlled, unspecified asthma severity, uncomplicated 05/18/2019  . Maternal obesity affecting pregnancy, antepartum 01/28/2019  . History of depression 01/28/2019  . Anxiety 01/26/2019  . History of bariatric surgery 01/13/2019  . Genital herpes simplex 01/13/2019  . Pregnant 12/25/2018  . Vitamin D deficiency 02/26/2017  . Pre-diabetes 02/26/2017  . Class 3 obesity with serious comorbidity and body mass index (BMI) of 60.0 to 69.9 in adult 02/26/2017  . Controlled type 2 diabetes mellitus without complication, without long-term current use of insulin (HCC) 04/21/2016  . Gastroesophageal reflux disease without esophagitis 04/21/2016  . Pseudotumor cerebri syndrome 11/29/2015  . Super obesity 11/29/2015  . OSA on CPAP 11/29/2015  . RLS (restless legs syndrome) 11/29/2015  . Papilledema 06/16/2015  . Pain in the wrist 04/28/2014  . 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 Elevated cholesterol 01/21/2014  . Sinus tachycardia 12/04/2011  . Essential hypertension 12/04/2011  . Hypertension 12/04/2011  . Morbid obesity (HCC) 11/07/2011    Past Surgical History:  Procedure Laterality Date  .  CESAREAN SECTION N/A 07/17/2019   Procedure: CESAREAN SECTION;  Surgeon: Osborn Coho, MD;  Location: [facility] LD ORS;  Service: Obstetrics;  Laterality: N/A;  . FRACTURE SURGERY  2005   ankle  . FRACTURE SURGERY  2013   wrist (deteriorating bone)  . LAPAROSCOPIC GASTRIC SLEEVE RESECTION N/A 05/08/2017   Procedure: LAPAROSCOPIC GASTRIC SLEEVE RESECTION;  Surgeon: Berna Bue, MD;  Location: [facility] ORS;  Service: General;  Laterality: N/A;  . LAPAROSCOPIC GASTRIC SLEEVE RESECTION  05/08/2017  . UPPER GI ENDOSCOPY  05/08/2017   Procedure:  UPPER GI ENDOSCOPY;  Surgeon: Berna Bue, MD;  Location: [facility] ORS;  Service: General;;  . urethra stretch    . WRIST SURGERY  2015    OB History    Gravida  1   Para  1   Term  1   Preterm  0   AB  0   Living  1     SAB  0   IAB  0   Ectopic  0   Multiple  0   Live Births  1            Home Medications    Prior to Admission medications   Medication Sig Start Date End Date Taking? Authorizing Provider  FASENRA PEN 30 MG/ML SOAJ INJECT 1 PEN UNDER THE SKIN AT WEEK 0, 4 AND 8, THEN INJECT 1 PEN EVERY 8 WEEKS THEREAFTER. 06/02/20  Yes Ellamae Sia, DO  levonorgestrel (MIRENA, 52 MG,) 20 MCG/24HR IUD Mirena 20 mcg/24 hours (6 yrs) 52 mg intrauterine device  Take 1 device by intrauterine route.   Yes [provider]  loratadine (CLARITIN) 10 MG tablet Take 10 mg by mouth daily. 03/27/20  Yes [provider]  naproxen (NAPROSYN) 500 MG tablet Take 1 tablet (500 mg total) by mouth 2 (two) times daily. 08/28/20  Yes Shereen Marton C, PA-C  UNKNOWN TO PATIENT States finishing an unk abx for UTI   Yes [provider]  albuterol (PROVENTIL) (2.5 MG/3ML) 0.083% nebulizer solution Take 3 mLs (2.5 mg total) by nebulization every 4 (four) hours as needed for wheezing or shortness of breath. 02/19/20   Ellamae Sia, DO  albuterol (VENTOLIN HFA) 108 (90 Base) MCG/ACT inhaler Inhale 2 puffs into the lungs every 4 (four) hours as needed for wheezing or shortness of breath (coughing fits). 08/24/20   Ellamae Sia, DO  budesonide-formoterol (SYMBICORT) 160-4.5 MCG/ACT inhaler Inhale 2 puffs into the lungs in the morning and at bedtime. with spacer and rinse mouth afterwards. 01/08/20   Ellamae Sia, DO  ipratropium-albuterol (DUONEB) 0.5-2.5 (3) MG/3ML SOLN ipratropium 0.5 mg-albuterol 3 mg (2.5 mg base)/3 mL nebulization soln  USE 1 VIAL VIA NEBULIZER EVERY 6 HOURS AS NEEDED    [provider]  NIFEdipine (ADALAT CC) 60 MG 24 hr tablet Take 60 mg by mouth  every morning.     [provider]  VENTOLIN HFA 108 (90 Base) MCG/ACT inhaler INHALE 2 PUFFS INTO THE LUNGS EVERY 6 HOURS AS NEEDED FOR WHEEZING OR SHORTNESS OF BREATH 06/21/20   Ellamae Sia, DO  cetirizine (ZYRTEC) 10 MG tablet Take 10 mg by mouth daily. 03/27/20 07/21/20  [provider]  fluticasone (FLONASE) 50 MCG/ACT nasal spray Place 2 sprays into both nostrils daily. 10/03/19 08/28/20  [name], FNP    Family History Family History  Problem Relation Age of Onset  . Heart attack Mother   . Hypertension Mother   . Obesity  Mother   . Depression Mother   . Anxiety disorder Mother   . Bipolar disorder Mother   . Allergic rhinitis Mother   . Sleep apnea Mother   . Cancer Father   . Allergic rhinitis Father   . Depression Father   . Alcoholism Father   . Allergic rhinitis Brother   . Allergic rhinitis Maternal Grandmother   . Migraines Neg Hx   . Asthma Neg Hx   . Eczema Neg Hx   . Urticaria Neg Hx     Social History Social History   Tobacco Use  . Smoking status: Former Smoker    Packs/day: 1.00    Types: Cigarettes    Quit date: 10/05/2018    Years since quitting: 1.8  . Smokeless tobacco: Never Used  Vaping Use  . Vaping Use: Never used  Substance Use Topics  . Alcohol use: Not Currently  . Drug use: Not Currently    Types: Marijuana     Allergies   Cats claw (uncaria tomentosa), Dust mite extract, Grass pollen(k-o-r-t-swt vern), and Mixed feathers   Review of Systems Review of Systems  Constitutional: Negative for fatigue and fever.  Eyes: Negative for visual disturbance.  Respiratory: Negative for shortness of breath.   Cardiovascular: Negative for chest pain.  Gastrointestinal: Negative for abdominal pain, nausea and vomiting.  Musculoskeletal: Positive for gait problem and joint swelling. Negative for arthralgias.  Skin: Positive for color change. Negative for rash and wound.  Neurological: Negative for dizziness,  weakness, light-headedness and headaches.     Physical Exam Triage Vital Signs ED Triage Vitals  Enc Vitals Group     BP 08/28/20 1045 (!) 147/96     Pulse Rate 08/28/20 1045 (!) 113     Resp 08/28/20 1045 18     Temp 08/28/20 1045 99.1 F (37.3 C)     Temp Source 08/28/20 1045 Oral     SpO2 08/28/20 1045 97 %     Weight --      Height --      Head Circumference --      Peak Flow --      Pain Score 08/28/20 1047 6     Pain Loc --      Pain Edu? --      Excl. in GC? --    No data found.  Updated Vital Signs BP (!) 147/96 Comment: States being monitored by PCP  Pulse (!) 113   Temp 99.1 F (37.3 C) (Oral)   Resp 18   SpO2 97%   Breastfeeding No   Visual Acuity Right Eye Distance:   Left Eye Distance:   Bilateral Distance:    Right Eye Near:   Left Eye Near:    Bilateral Near:     Physical Exam Vitals and nursing note reviewed.  Constitutional:      Appearance: She is well-developed.     Comments: No acute distress  HENT:     Head: Normocephalic and atraumatic.     Nose: Nose normal.  Eyes:     Conjunctiva/sclera: Conjunctivae normal.  Cardiovascular:     Rate and Rhythm: Normal rate.  Pulmonary:     Effort: Pulmonary effort is normal. No respiratory distress.  Abdominal:     General: There is no distension.  Musculoskeletal:        General: Normal range of motion.     Cervical back: Neck supple.     Comments: Left foot: Moderate swelling in dorsum of foot compared  to right, mild ecchymosis noted to proximal dorsum of foot, diffuse tenderness throughout dorsum of foot, dorsalis pedis 2+, nontender to medial lateral malleolus  Skin:    General: Skin is warm and dry.  Neurological:     Mental Status: She is alert and oriented to person, place, and time.      UC Treatments / Results  Labs (all labs ordered are listed, but only abnormal results are displayed) Labs Reviewed - No data to display  EKG   Radiology DG Foot Complete Left  Result  Date: 08/28/2020 CLINICAL DATA:  Bruising and tenderness of the left foot starting this morning. No injury. EXAM: LEFT FOOT - COMPLETE 3+ VIEW COMPARISON:  None. FINDINGS: There is no evidence of fracture or dislocation. Prior fixation of the distal tibia and fibula are noted. There is soft tissue swelling of the midfoot. IMPRESSION: No acute fracture or dislocation. Soft tissue swelling of the midfoot. Electronically Signed   By: Sherian Rein M.D.   On: 08/28/2020 11:56    Procedures Procedures (including critical care time)  Medications Ordered in UC Medications - No data to display  Initial Impression / Assessment and Plan / UC Course  I have reviewed the triage vital signs and the nursing notes.  Pertinent labs & imaging results that were available during my care of the patient were reviewed by me and considered in my medical decision making (see chart for details).     X-ray negative, hardware stable, will treat as sprain, no signs of infection, low suspicion of DVT at this time.  Providing Ace wrap for compression and swelling, recommending anti-inflammatories ice and elevation.  Continue to monitor.  Discussed strict return precautions. Patient verbalized understanding and is agreeable with plan.  Final Clinical Impressions(s) / UC Diagnoses   Final diagnoses:  Sprain of left foot, initial encounter     Discharge Instructions     X-ray normal We will treat his foot sprain Ice and elevate Ace wrap for comfort/compression to help with swelling Anti-inflammatories, may use Naprosyn twice daily over-the-counter ibuprofen and Tylenol Follow-up if not improving or worsening    ED Prescriptions    Medication Sig Dispense Auth. Provider   naproxen (NAPROSYN) 500 MG tablet Take 1 tablet (500 mg total) by mouth 2 (two) times daily. 30 tablet Dailee Manalang, [place] C, PA-C     PDMP not reviewed this encounter.   Wane Mollett, [place] C, PA-C 08/28/20 1226

## 2020-08-28 NOTE — Discharge Instructions (Signed)
X-ray normal We will treat his foot sprain Ice and elevate Ace wrap for comfort/compression to help with swelling Anti-inflammatories, may use Naprosyn twice daily over-the-counter ibuprofen and Tylenol Follow-up if not improving or worsening

## 2020-08-29 ENCOUNTER — Encounter: Payer: Self-pay | Admitting: Podiatry

## 2020-08-31 ENCOUNTER — Ambulatory Visit (INDEPENDENT_AMBULATORY_CARE_PROVIDER_SITE_OTHER): Payer: Medicaid Other

## 2020-08-31 ENCOUNTER — Ambulatory Visit (INDEPENDENT_AMBULATORY_CARE_PROVIDER_SITE_OTHER): Payer: Medicaid Other | Admitting: Podiatry

## 2020-08-31 ENCOUNTER — Other Ambulatory Visit: Payer: Self-pay

## 2020-08-31 DIAGNOSIS — M778 Other enthesopathies, not elsewhere classified: Secondary | ICD-10-CM

## 2020-08-31 MED ORDER — BETAMETHASONE SOD PHOS & ACET 6 (3-3) MG/ML IJ SUSP
3.0000 mg | Freq: Once | INTRAMUSCULAR | Status: AC
  Administered 2020-08-31: 3 mg via INTRA_ARTICULAR

## 2020-08-31 MED ORDER — DICLOFENAC SODIUM 75 MG PO TBEC: 75.0000 mg | DELAYED_RELEASE_TABLET | Freq: Two times a day (BID) | ORAL | 1 refills | Status: DC

## 2020-08-31 NOTE — Progress Notes (Signed)
   HPI: 30 y.o. female presenting today for new complaint regarding left foot pain is been going on for approximately 1 week.  She went to urgent care and was referred here.  She denies a history of injury.  She woke up 1 morning with left foot pain.  Is been very severe and she is unable to bear weight and ambulate without pain.  She is currently taking naproxen prescribed from the urgent care.  She presents for further treatment and evaluation  Past Medical History:  Diagnosis Date  . Ankle fracture, left 2000  . Anxiety   . Arthritis    left wrist and left ankle  . Asthma   . Back pain   . Back pain   . Bronchitis   . Depression   . Diabetes (HCC) 01/11/2016   type II  history of  . Family history of adverse reaction to anesthesia    gmother had problems with N/V   . GERD (gastroesophageal reflux disease)   . Headache   . High cholesterol   . History of kidney stones   . HLD (hyperlipidemia)   . HSV-2 infection   . Hypertension 2013  . Intracranial hypertension    psuedotumor cebrum  . Joint pain   . Joint pain   . Kienbock's disease   . Leg edema   . OSA (obstructive sleep apnea)    cpap - does not know settings   . Papilledema   . Prediabetes   . Pregnancy induced hypertension   . Pseudotumor cerebri syndrome   . RLS (restless legs syndrome)   . Sinus tachycardia   . SOB (shortness of breath)      Physical Exam: General: The patient is alert and oriented x3 in no acute distress.  Dermatology: Skin is warm, dry and supple bilateral lower extremities. Negative for open lesions or macerations.  Vascular: Palpable pedal pulses bilaterally. No edema or erythema noted. Capillary refill within normal limits.  Neurological: Epicritic and protective threshold grossly intact bilaterally.   Musculoskeletal Exam: Range of motion within normal limits to all pedal and ankle joints bilateral. Muscle strength 5/5 in all groups bilateral.  Pain on palpation throughout the left  midfoot and midtarsal joint  Radiographic Exam:  Normal osseous mineralization. Joint spaces preserved. No fracture/dislocation/boney destruction.    Assessment: 1.  Left foot capsulitis   Plan of Care:  1. Patient evaluated. X-Rays reviewed.  2.  Injection of 0.5 cc Celestone Soluspan injected into the dorsal midfoot and the medial aspect of the left foot 3.  Prescription for diclofenac 75 mg 2 times daily.  Discontinue naproxen 4.  Ace wrap was provided.  Ace wrap daily 5.  Postsurgical shoe dispensed.  Wear daily weightbearing as tolerated 6.  Return to clinic in 3 weeks      Felecia Shelling, DPM [facility]  Dr. Felecia Shelling, DPM    [address] [place], [place] [address]                Office [phone]  Fax [phone]

## 2020-09-21 ENCOUNTER — Ambulatory Visit: Payer: Medicaid Other | Admitting: Podiatry

## 2020-09-26 ENCOUNTER — Ambulatory Visit
Admission: EM | Admit: 2020-09-26 | Discharge: 2020-09-26 | Disposition: A | Discharge status: Home / Self Care | Payer: Medicaid Other | Attending: Emergency Medicine | Admitting: Emergency Medicine

## 2020-09-26 ENCOUNTER — Encounter: Payer: Self-pay | Admitting: *Deleted

## 2020-09-26 ENCOUNTER — Other Ambulatory Visit: Payer: Self-pay

## 2020-09-26 DIAGNOSIS — J029 Acute pharyngitis, unspecified: Secondary | ICD-10-CM

## 2020-09-26 LAB — POCT RAPID STREP A (OFFICE): Rapid Strep A Screen: NEGATIVE

## 2020-09-26 MED ORDER — IBUPROFEN 800 MG PO TABS: 800.0000 mg | ORAL_TABLET | Freq: Three times a day (TID) | ORAL | 0 refills | Status: DC

## 2020-09-26 NOTE — ED Triage Notes (Signed)
Pt reports the mouth pat started last week and is now worse. Pt points to RT lower jaw and reports pain goes to back of throat .

## 2020-09-26 NOTE — Discharge Instructions (Signed)
Sore Throat  Your rapid strep tested Negative today.   Please continue Tylenol or Ibuprofen for fever and pain. Warm compresses. May try salt water gargles, cepacol lozenges, throat spray, or OTC cold relief medicine for throat discomfort. If you also have congestion take a daily anti-histamine like Zyrtec, Claritin, and a oral decongestant to help with post nasal drip that may be irritating your throat.   Stay hydrated and drink plenty of fluids to keep your throat coated relieve irritation.

## 2020-09-26 NOTE — ED Provider Notes (Signed)
[name]    [name]: [id number] Arrival date & time: 09/26/20  1337      History   Chief Complaint Chief Complaint  Patient presents with  . mouth pain on RT    HPI Cassandra Tucker is a 30 y.o. female history of hypertension, GERD, DM type II, presenting today for evaluation of throat pain/neck pain.  Reports over the past 4 to 5 days she has had discomfort in her throat, sore throat and tongue pain.  Symptoms have worsened over the past 1 to 2 days.  Has had some mild associated congestion and drainage.  Denies cough.  Denies fevers.  HPI  Past Medical History:  Diagnosis Date  . Ankle fracture, left 2000  . Anxiety   . Arthritis    left wrist and left ankle  . Asthma   . Back pain   . Back pain   . Bronchitis   . Depression   . Diabetes (HCC) 01/11/2016   type II  history of  . Family history of adverse reaction to anesthesia    gmother had problems with N/V   . GERD (gastroesophageal reflux disease)   . Headache   . High cholesterol   . History of kidney stones   . HLD (hyperlipidemia)   . HSV-2 infection   . Hypertension 2013  . Intracranial hypertension    psuedotumor cebrum  . Joint pain   . Joint pain   . Kienbock's disease   . Leg edema   . OSA (obstructive sleep apnea)    cpap - does not know settings   . Papilledema   . Prediabetes   . Pregnancy induced hypertension   . Pseudotumor cerebri syndrome   . RLS (restless legs syndrome)   . Sinus tachycardia   . SOB (shortness of breath)     Patient Active Problem List   Diagnosis Date Noted  . Hyperlipidemia, mixed 03/03/2020  . Severe persistent asthma without complication 02/19/2020  . Seasonal and perennial allergic rhinoconjunctivitis 01/01/2020  . Postpartum anemia 07/20/2019  . Postpartum care following cesarean delivery 2/4 07/18/2019  . Cesarean delivery -  IOL with fetal intolerance of labor and FTP 07/18/2019  . Marginal insertion of umbilical cord affecting management of  mother 07/16/2019  . Chronic hypertension affecting pregnancy 07/01/2019  . Pregnancy 06/29/2019  . BMI 45.0-49.9, adult (HCC) 06/29/2019  . Pregnancy-induced hypertension 06/26/2019  . Anemia 05/21/2019  . Asthma, not well controlled, unspecified asthma severity, uncomplicated 05/18/2019  . Maternal obesity affecting pregnancy, antepartum 01/28/2019  . History of depression 01/28/2019  . Anxiety 01/26/2019  . History of bariatric surgery 01/13/2019  . Genital herpes simplex 01/13/2019  . Pregnant 12/25/2018  . Vitamin D deficiency 02/26/2017  . Pre-diabetes 02/26/2017  . Class 3 obesity with serious comorbidity and body mass index (BMI) of 60.0 to 69.9 in adult 02/26/2017  . Controlled type 2 diabetes mellitus without complication, without long-term current use of insulin (HCC) 04/21/2016  . Gastroesophageal reflux disease without esophagitis 04/21/2016  . Pseudotumor cerebri syndrome 11/29/2015  . Super obesity 11/29/2015  . OSA on CPAP 11/29/2015  . RLS (restless legs syndrome) 11/29/2015  . Papilledema 06/16/2015  . Pain in the wrist 04/28/2014  . 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 Elevated cholesterol 01/21/2014  . Sinus tachycardia 12/04/2011  . Essential hypertension 12/04/2011  . Hypertension 12/04/2011  . Morbid obesity (HCC) 11/07/2011    Past Surgical History:  Procedure Laterality Date  . CESAREAN SECTION N/A 07/17/2019  Procedure: CESAREAN SECTION;  Surgeon: Osborn Cohooberts, Angela, MD;  [facility];  Service: Obstetrics;  Laterality: N/A;  . FRACTURE SURGERY  2005   ankle  . FRACTURE SURGERY  2013   wrist (deteriorating bone)  . LAPAROSCOPIC GASTRIC SLEEVE RESECTION N/A 05/08/2017   Procedure: LAPAROSCOPIC GASTRIC SLEEVE RESECTION;  Surgeon: Berna Bueonnor, Chelsea A, MD;  Location: [facility] ORS;  Service: General;  Laterality: N/A;  . LAPAROSCOPIC GASTRIC SLEEVE RESECTION  05/08/2017  . UPPER GI ENDOSCOPY  05/08/2017   Procedure: UPPER GI ENDOSCOPY;  Surgeon: Berna Bueonnor,  Chelsea A, MD;  Location: [facility] ORS;  Service: General;;  . urethra stretch    . WRIST SURGERY  2015    OB History    Gravida  1   Para  1   Term  1   Preterm  0   AB  0   Living  1     SAB  0   IAB  0   Ectopic  0   Multiple  0   Live Births  1            Home Medications    Prior to Admission medications   Medication Sig Start Date End Date Taking? Authorizing Provider  ibuprofen (ADVIL) 800 MG tablet Take 1 tablet (800 mg total) by mouth 3 (three) times daily. 09/26/20  Yes Erianna Jolly C, PA-C  albuterol (PROVENTIL) (2.5 MG/3ML) 0.083% nebulizer solution Take 3 mLs (2.5 mg total) by nebulization every 4 (four) hours as needed for wheezing or shortness of breath. 02/19/20   Ellamae SiaKim, Yoon M, DO  albuterol (VENTOLIN HFA) 108 (90 Base) MCG/ACT inhaler Inhale 2 puffs into the lungs every 4 (four) hours as needed for wheezing or shortness of breath (coughing fits). 08/24/20   Ellamae SiaKim, Yoon M, DO  budesonide-formoterol (SYMBICORT) 160-4.5 MCG/ACT inhaler Inhale 2 puffs into the lungs in the morning and at bedtime. with spacer and rinse mouth afterwards. 01/08/20   Ellamae SiaKim, Yoon M, DO  FASENRA PEN 30 MG/ML SOAJ INJECT 1 PEN UNDER THE SKIN AT WEEK 0, 4 AND 8, THEN INJECT 1 PEN EVERY 8 WEEKS THEREAFTER. 06/02/20   Ellamae SiaKim, Yoon M, DO  ipratropium-albuterol (DUONEB) 0.5-2.5 (3) MG/3ML SOLN ipratropium 0.5 mg-albuterol 3 mg (2.5 mg base)/3 mL nebulization soln  USE 1 VIAL VIA NEBULIZER EVERY 6 HOURS AS NEEDED    [provider]  levonorgestrel (MIRENA, 52 MG,) 20 MCG/24HR IUD Mirena 20 mcg/24 hours (6 yrs) 52 mg intrauterine device  Take 1 device by intrauterine route.    [provider]  loratadine (CLARITIN) 10 MG tablet Take 10 mg by mouth daily. 03/27/20   [provider]  NIFEdipine (ADALAT CC) 60 MG 24 hr tablet Take 60 mg by mouth every morning.     [provider]  UNKNOWN TO PATIENT States finishing an unk abx for UTI    [provider]   VENTOLIN HFA 108 (90 Base) MCG/ACT inhaler INHALE 2 PUFFS INTO THE LUNGS EVERY 6 HOURS AS NEEDED FOR WHEEZING OR SHORTNESS OF BREATH 06/21/20   Ellamae SiaKim, Yoon M, DO  cetirizine (ZYRTEC) 10 MG tablet Take 10 mg by mouth daily. 03/27/20 07/21/20  [provider]  fluticasone (FLONASE) 50 MCG/ACT nasal spray Place 2 sprays into both nostrils daily. 10/03/19 08/28/20  Bennie PieriniMartin, Mary-Margaret, FNP    Family History Family History  Problem Relation Age of Onset  . Heart attack Mother   . Hypertension Mother   . Obesity Mother   . Depression Mother   .  Anxiety disorder Mother   . Bipolar disorder Mother   . Allergic rhinitis Mother   . Sleep apnea Mother   . Cancer Father   . Allergic rhinitis Father   . Depression Father   . Alcoholism Father   . Allergic rhinitis Brother   . Allergic rhinitis Maternal Grandmother   . Migraines Neg Hx   . Asthma Neg Hx   . Eczema Neg Hx   . Urticaria Neg Hx     Social History Social History   Tobacco Use  . Smoking status: Former Smoker    Packs/day: 1.00    Types: Cigarettes    Quit date: 10/05/2018    Years since quitting: 1.9  . Smokeless tobacco: Never Used  Vaping Use  . Vaping Use: Never used  Substance Use Topics  . Alcohol use: Not Currently  . Drug use: Not Currently    Types: Marijuana     Allergies   Cats claw (uncaria tomentosa), Dust mite extract, Grass pollen(k-o-r-t-swt vern), and Mixed feathers   Review of Systems Review of Systems  Constitutional: Negative for activity change, appetite change, chills, fatigue and fever.  HENT: Positive for congestion and sore throat. Negative for ear pain, rhinorrhea, sinus pressure and trouble swallowing.   Eyes: Negative for discharge and redness.  Respiratory: Negative for cough, chest tightness and shortness of breath.   Cardiovascular: Negative for chest pain.  Gastrointestinal: Negative for abdominal pain, diarrhea, nausea and vomiting.  Musculoskeletal: Negative for myalgias.   Skin: Negative for rash.  Neurological: Negative for dizziness, light-headedness and headaches.     Physical Exam Triage Vital Signs ED Triage Vitals  Enc Vitals Group     BP 09/26/20 1352 (!) 154/99     Pulse Rate 09/26/20 1352 81     Resp 09/26/20 1352 18     Temp 09/26/20 1352 98.2 F (36.8 C)     Temp Source 09/26/20 1352 Oral     SpO2 09/26/20 1352 99 %     Weight --      Height --      Head Circumference --      Peak Flow --      Pain Score 09/26/20 1350 10     Pain Loc --      Pain Edu? --      Excl. in GC? --    No data found.  Updated Vital Signs BP (!) 154/99 (BP Location: Left Arm)   Pulse 81   Temp 98.2 F (36.8 C) (Oral)   Resp 18   SpO2 99%   Visual Acuity Right Eye Distance:   Left Eye Distance:   Bilateral Distance:    Right Eye Near:   Left Eye Near:    Bilateral Near:     Physical Exam Vitals and nursing note reviewed.  Constitutional:      Appearance: She is well-developed.     Comments: No acute distress  HENT:     Head: Normocephalic and atraumatic.     Ears:     Comments: Bilateral ears without tenderness to palpation of external auricle, tragus and mastoid, EAC's without erythema or swelling, TM's with good bony landmarks and cone of light. Non erythematous.     Nose: Nose normal.     Mouth/Throat:     Comments: Oral mucosa pink and moist, no tonsillar enlargement or exudate. Posterior pharynx patent and nonerythematous, no uvula deviation or swelling. Normal phonation. Eyes:     Conjunctiva/sclera: Conjunctivae normal.  Cardiovascular:  Rate and Rhythm: Normal rate and regular rhythm.  Pulmonary:     Effort: Pulmonary effort is normal. No respiratory distress.     Comments: Breathing comfortably at rest, CTABL, no wheezing, rales or other adventitious sounds auscultated Abdominal:     General: There is no distension.  Musculoskeletal:        General: Normal range of motion.     Cervical back: Neck supple.  Skin:     General: Skin is warm and dry.  Neurological:     Mental Status: She is alert and oriented to person, place, and time.      UC Treatments / Results  Labs (all labs ordered are listed, but only abnormal results are displayed) Labs Reviewed  CULTURE, GROUP A STREP [facility])  POCT RAPID STREP A (OFFICE)    EKG   Radiology No results found.  Procedures Procedures (including critical care time)  Medications Ordered in UC Medications - No data to display  Initial Impression / Assessment and Plan / UC Course  I have reviewed the triage vital signs and the nursing notes.  Pertinent labs & imaging results that were available during my care of the patient were reviewed by me and considered in my medical decision making (see chart for details).     Strep test negative, oropharynx exam relatively unremarkable, no signs of thrush, no signs of peritonsillar abscess or deep space infection, full active range of motion of neck without overlying erythema or swelling  Recommending treatment for viral etiology with close monitoring, symptomatic and supportive care with anti-inflammatories warm compresses and salt water gargles.  Discussed strict return precautions. Patient verbalized understanding and is agreeable with plan.  Final Clinical Impressions(s) / UC Diagnoses   Final diagnoses:  Sore throat     Discharge Instructions     Sore Throat  Your rapid strep tested Negative today.   Please continue Tylenol or Ibuprofen for fever and pain. Warm compresses. May try salt water gargles, cepacol lozenges, throat spray, or OTC cold relief medicine for throat discomfort. If you also have congestion take a daily anti-histamine like Zyrtec, Claritin, and a oral decongestant to help with post nasal drip that may be irritating your throat.   Stay hydrated and drink plenty of fluids to keep your throat coated relieve irritation.     ED Prescriptions    Medication Sig Dispense Auth.  Provider   ibuprofen (ADVIL) 800 MG tablet Take 1 tablet (800 mg total) by mouth 3 (three) times daily. 21 tablet Keltie Labell, [place] C, PA-C     PDMP not reviewed this encounter.   Lew Dawes, [place] 09/26/20 1630

## 2020-09-30 LAB — CULTURE, GROUP A STREP (THRC)

## 2020-10-05 ENCOUNTER — Ambulatory Visit: Payer: Medicaid Other | Admitting: Podiatry

## 2020-10-05 ENCOUNTER — Other Ambulatory Visit: Payer: Self-pay

## 2020-10-05 DIAGNOSIS — M778 Other enthesopathies, not elsewhere classified: Secondary | ICD-10-CM

## 2020-10-05 DIAGNOSIS — M7752 Other enthesopathy of left foot: Secondary | ICD-10-CM | POA: Diagnosis not present

## 2020-10-05 MED ORDER — BETAMETHASONE SOD PHOS & ACET 6 (3-3) MG/ML IJ SUSP
3.0000 mg | Freq: Once | INTRAMUSCULAR | Status: AC
  Administered 2020-10-05: 3 mg via INTRA_ARTICULAR

## 2020-10-05 MED ORDER — DICLOFENAC SODIUM 75 MG PO TBEC: 75.0000 mg | DELAYED_RELEASE_TABLET | Freq: Two times a day (BID) | ORAL | 1 refills | Status: DC

## 2020-10-05 NOTE — Progress Notes (Signed)
   HPI: 30 y.o. female presenting today for follow-up evaluation left foot pain.  Patient states that she is feeling much better.  She states that the injection and the diclofenac helped significantly.  No new complaints at this time Past Medical History:  Diagnosis Date  . Ankle fracture, left 2000  . Anxiety   . Arthritis    left wrist and left ankle  . Asthma   . Back pain   . Back pain   . Bronchitis   . Depression   . Diabetes (HCC) 01/11/2016   type II  history of  . Family history of adverse reaction to anesthesia    gmother had problems with N/V   . GERD (gastroesophageal reflux disease)   . Headache   . High cholesterol   . History of kidney stones   . HLD (hyperlipidemia)   . HSV-2 infection   . Hypertension 2013  . Intracranial hypertension    psuedotumor cebrum  . Joint pain   . Joint pain   . Kienbock's disease   . Leg edema   . OSA (obstructive sleep apnea)    cpap - does not know settings   . Papilledema   . Prediabetes   . Pregnancy induced hypertension   . Pseudotumor cerebri syndrome   . RLS (restless legs syndrome)   . Sinus tachycardia   . SOB (shortness of breath)      Physical Exam: General: The patient is alert and oriented x3 in no acute distress.  Dermatology: Skin is warm, dry and supple bilateral lower extremities. Negative for open lesions or macerations.  Vascular: Palpable pedal pulses bilaterally. No edema or erythema noted. Capillary refill within normal limits.  Neurological: Epicritic and protective threshold grossly intact bilaterally.   Musculoskeletal Exam: Range of motion within normal limits to all pedal and ankle joints bilateral. Muscle strength 5/5 in all groups bilateral.  Improved pain on palpation throughout the left midfoot and midtarsal joint  Assessment: 1.  Left foot capsulitis   Plan of Care:  1. Patient evaluated. 2.  Injection of 0.5 cc Celestone Soluspan injected into the dorsal midfoot and the medial aspect  of the left foot 3.  Refill prescription for diclofenac 75 mg 2 times daily.  Discontinue naproxen 4.  Recommend good supportive sneakers.  Discontinue postsurgical shoe 5.  Return to clinic in as needed      Felecia Shelling, DPM [facility]  Dr. Felecia Shelling, DPM    [address] [place], [place] [address]                Office [phone]  Fax [phone]

## 2020-11-02 DIAGNOSIS — L304 Erythema intertrigo: Secondary | ICD-10-CM | POA: Diagnosis not present

## 2020-11-02 DIAGNOSIS — Z113 Encounter for screening for infections with a predominantly sexual mode of transmission: Secondary | ICD-10-CM | POA: Diagnosis not present

## 2020-11-02 DIAGNOSIS — Z30431 Encounter for routine checking of intrauterine contraceptive device: Secondary | ICD-10-CM | POA: Diagnosis not present

## 2020-11-02 DIAGNOSIS — N898 Other specified noninflammatory disorders of vagina: Secondary | ICD-10-CM | POA: Diagnosis not present

## 2020-11-23 ENCOUNTER — Encounter: Payer: Self-pay | Admitting: Plastic Surgery

## 2020-11-23 ENCOUNTER — Institutional Professional Consult (permissible substitution): Payer: Medicaid Other | Admitting: Plastic Surgery

## 2020-11-23 ENCOUNTER — Ambulatory Visit (INDEPENDENT_AMBULATORY_CARE_PROVIDER_SITE_OTHER): Payer: Medicaid Other | Admitting: Plastic Surgery

## 2020-11-23 ENCOUNTER — Other Ambulatory Visit: Payer: Self-pay

## 2020-11-23 DIAGNOSIS — Z9884 Bariatric surgery status: Secondary | ICD-10-CM | POA: Diagnosis not present

## 2020-11-23 DIAGNOSIS — G932 Benign intracranial hypertension: Secondary | ICD-10-CM | POA: Diagnosis not present

## 2020-11-23 DIAGNOSIS — I10 Essential (primary) hypertension: Secondary | ICD-10-CM | POA: Diagnosis not present

## 2020-11-23 DIAGNOSIS — M793 Panniculitis, unspecified: Secondary | ICD-10-CM | POA: Diagnosis not present

## 2020-11-23 NOTE — Progress Notes (Signed)
Patient ID: Cassandra Tucker, female    DOB: [birth year], 30 y.o.   MRN: [id number]   Chief Complaint  Patient presents with   Advice Only    The patient is a 30 year old female here for evaluation of her abdominal area.  The patient was 410 pounds in 2018.  She underwent a gastric bypass with Dr. Fredricka Bonine.  Since then she has been able to reduce her weight to 349 pounds.  Her BMI is 54.7 kg/m.  She is 5 feet 7 inches tall.  She is also had a C-section.  She does not have any diabetes and she is not a smoker.  She has back pain and gets constant rashes in her skin folds she is working with healthy weight and wellness.  Her weight has been stable for the last 6 months.  Her goal is to be around 200 pounds.  Her past medical history is positive for depression, pseudotumor cerebri syndrome and hypertension.  She does not have any hernias or diastases skin breakdown is visible and shown in the pictures.   Review of Systems  Constitutional:  Positive for activity change. Negative for appetite change.  HENT: Negative.    Eyes: Negative.   Respiratory:  Negative for chest tightness and shortness of breath.   Cardiovascular:  Negative for chest pain and leg swelling.  Gastrointestinal: Negative.  Negative for abdominal distention and abdominal pain.  Endocrine: Negative.   Genitourinary: Negative.   Musculoskeletal:  Positive for back pain.  Skin:  Positive for color change and rash.  Allergic/Immunologic: Negative.   Neurological: Negative.   Hematological: Negative.   Psychiatric/Behavioral: Negative.     Past Medical History:  Diagnosis Date   Ankle fracture, left 2000   Anxiety    Arthritis    left wrist and left ankle   Asthma    Back pain    Back pain    Bronchitis    Depression    Diabetes (HCC) 01/11/2016   type II  history of   Family history of adverse reaction to anesthesia    gmother had problems with N/V    GERD (gastroesophageal reflux disease)    Headache    High  cholesterol    History of kidney stones    HLD (hyperlipidemia)    HSV-2 infection    Hypertension 2013   Intracranial hypertension    psuedotumor cebrum   Joint pain    Joint pain    Kienbock's disease    Leg edema    OSA (obstructive sleep apnea)    cpap - does not know settings    Papilledema    Prediabetes    Pregnancy induced hypertension    Pseudotumor cerebri syndrome    RLS (restless legs syndrome)    Sinus tachycardia    SOB (shortness of breath)     Past Surgical History:  Procedure Laterality Date   CESAREAN SECTION N/A 07/17/2019   Procedure: CESAREAN SECTION;  Surgeon: Osborn Coho, MD;  Location: [facility] LD ORS;  Service: Obstetrics;  Laterality: N/A;   FRACTURE SURGERY  2005   ankle   FRACTURE SURGERY  2013   wrist (deteriorating bone)   LAPAROSCOPIC GASTRIC SLEEVE RESECTION N/A 05/08/2017   Procedure: LAPAROSCOPIC GASTRIC SLEEVE RESECTION;  Surgeon: Berna Bue, MD;  Location: [facility] ORS;  Service: General;  Laterality: N/A;   LAPAROSCOPIC GASTRIC SLEEVE RESECTION  05/08/2017   UPPER GI ENDOSCOPY  05/08/2017   Procedure: UPPER GI ENDOSCOPY;  Surgeon: Fredricka Bonine,  Lady Deutscher, MD;  Location: [facility] ORS;  Service: General;;   urethra stretch     WRIST SURGERY  2015      Current Outpatient Medications:    albuterol (PROVENTIL) (2.5 MG/3ML) 0.083% nebulizer solution, Take 3 mLs (2.5 mg total) by nebulization every 4 (four) hours as needed for wheezing or shortness of breath., Disp: 75 mL, Rfl: 1   albuterol (VENTOLIN HFA) 108 (90 Base) MCG/ACT inhaler, Inhale 2 puffs into the lungs every 4 (four) hours as needed for wheezing or shortness of breath (coughing fits)., Disp: 8 g, Rfl: 2   budesonide-formoterol (SYMBICORT) 160-4.5 MCG/ACT inhaler, Inhale 2 puffs into the lungs in the morning and at bedtime. with spacer and rinse mouth afterwards., Disp: 1 Inhaler, Rfl: 5   diclofenac (VOLTAREN) 75 MG EC tablet, Take 1 tablet (75 mg total) by mouth 2 (two) times daily., Disp: 60  tablet, Rfl: 1   FASENRA PEN 30 MG/ML SOAJ, INJECT 1 PEN UNDER THE SKIN AT WEEK 0, 4 AND 8, THEN INJECT 1 PEN EVERY 8 WEEKS THEREAFTER., Disp: 1 mL, Rfl: 7   ibuprofen (ADVIL) 800 MG tablet, Take 1 tablet (800 mg total) by mouth 3 (three) times daily., Disp: 21 tablet, Rfl: 0   ipratropium-albuterol (DUONEB) 0.5-2.5 (3) MG/3ML SOLN, ipratropium 0.5 mg-albuterol 3 mg (2.5 mg base)/3 mL nebulization soln  USE 1 VIAL VIA NEBULIZER EVERY 6 HOURS AS NEEDED, Disp: , Rfl:    levonorgestrel (MIRENA, 52 MG,) 20 MCG/24HR IUD, Mirena 20 mcg/24 hours (6 yrs) 52 mg intrauterine device  Take 1 device by intrauterine route., Disp: , Rfl:    loratadine (CLARITIN) 10 MG tablet, Take 10 mg by mouth daily., Disp: , Rfl:    VENTOLIN HFA 108 (90 Base) MCG/ACT inhaler, INHALE 2 PUFFS INTO THE LUNGS EVERY 6 HOURS AS NEEDED FOR WHEEZING OR SHORTNESS OF BREATH, Disp: 18 g, Rfl: 1   NIFEdipine (ADALAT CC) 60 MG 24 hr tablet, Take 60 mg by mouth every morning. , Disp: , Rfl:    Objective:   Vitals:   11/23/20 0916  BP: (!) 160/115  Pulse: 98  SpO2: 99%    Physical Exam Vitals and nursing note reviewed.  Constitutional:      Appearance: Normal appearance.  HENT:     Head: Normocephalic and atraumatic.  Cardiovascular:     Rate and Rhythm: Normal rate.     Pulses: Normal pulses.  Pulmonary:     Effort: Pulmonary effort is normal. No respiratory distress.     Breath sounds: No wheezing.  Abdominal:     General: Abdomen is flat. There is no distension.     Palpations: There is no mass.     Tenderness: There is no abdominal tenderness.     Hernia: No hernia is present.  Skin:    General: Skin is warm.     Capillary Refill: Capillary refill takes less than 2 seconds.     Coloration: Skin is not jaundiced.     Findings: Erythema and lesion present. No bruising.  Neurological:     General: No focal deficit present.     Mental Status: She is alert and oriented to person, place, and time.  Psychiatric:         Mood and Affect: Mood normal.        Behavior: Behavior normal.        Thought Content: Thought content normal.    Assessment & Plan:  Morbid obesity (HCC)  History of bariatric surgery  Pseudotumor cerebri syndrome  Essential hypertension  Panniculitis  I think the patient will be a good candidate for panniculectomy when she is closer to her desired weight.  She is going to reconsult with Dr. Doylene Canard to see about a revision.  She will also talk with the healthy weight and wellness folks to see if she can enhance her weight loss program.  I have encouraged her to get back in touch with [place] as she moves along this process.  We talked about a panniculectomy and her options for surgery and how long the surgery would take as well as with the scar would look like.  Pictures were obtained of the patient and placed in the chart with the patient's or guardian's permission.   Alena Bills Danyeal Akens, DO

## 2020-12-01 ENCOUNTER — Encounter (HOSPITAL_COMMUNITY): Payer: Self-pay | Admitting: *Deleted

## 2020-12-01 ENCOUNTER — Encounter: Payer: Self-pay | Admitting: Plastic Surgery

## 2020-12-19 ENCOUNTER — Other Ambulatory Visit: Payer: Self-pay | Admitting: Nurse Practitioner

## 2020-12-23 IMAGING — DX DG ANKLE COMPLETE 3+V*R*
3 series · 3 of 3 positions shown · non-contrast
Comparison: None.

CLINICAL DATA: Pain following inversion injury

EXAM:
RIGHT ANKLE - COMPLETE 3+ VIEW

[ankle ap]
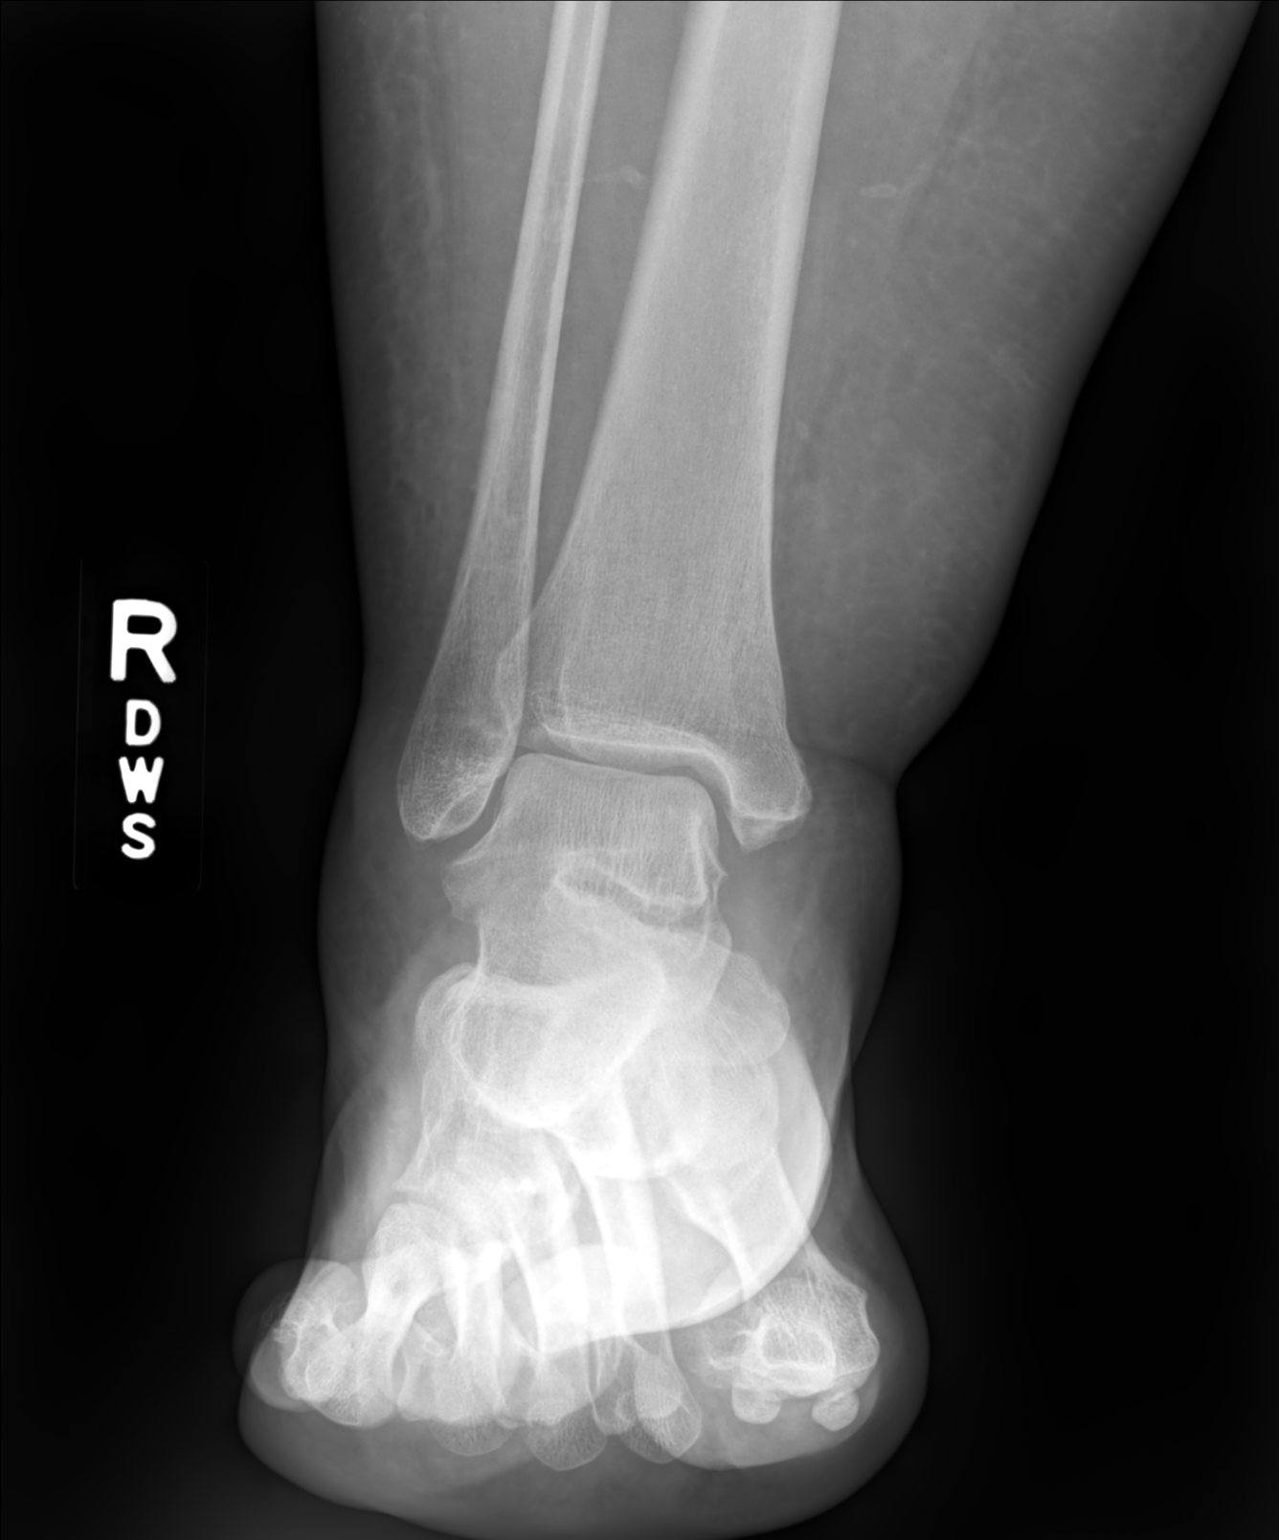

[ankle medial oblique]
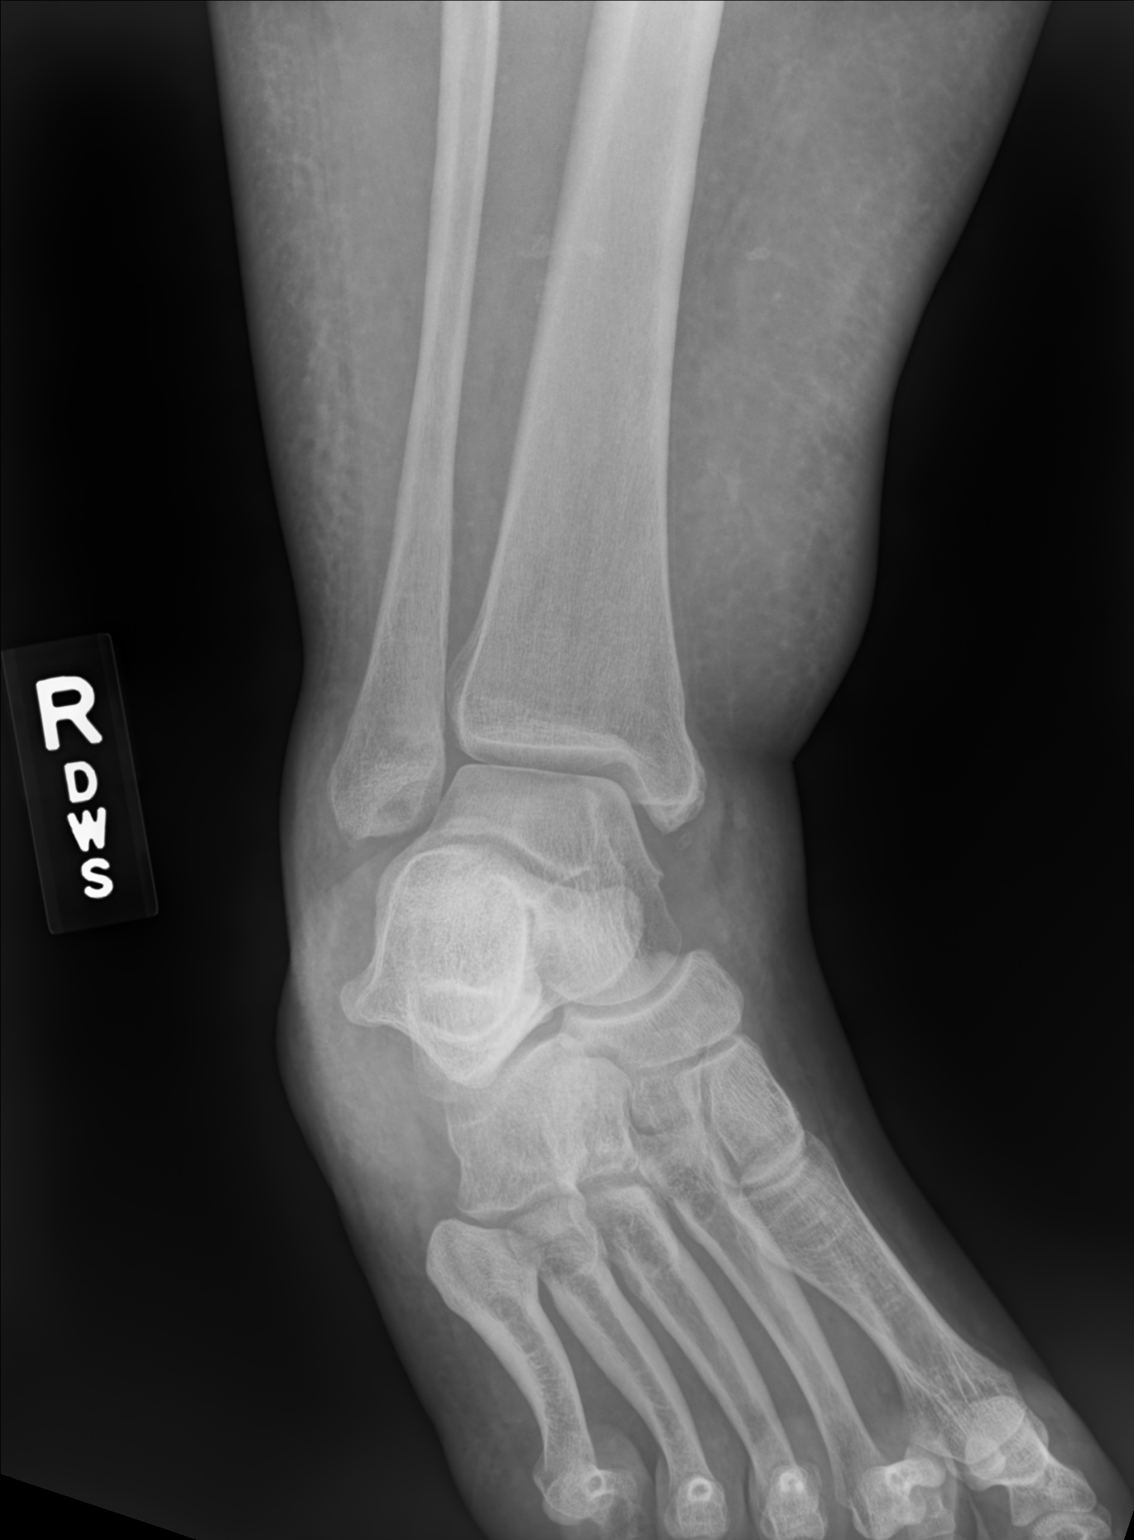

[ankle lat]
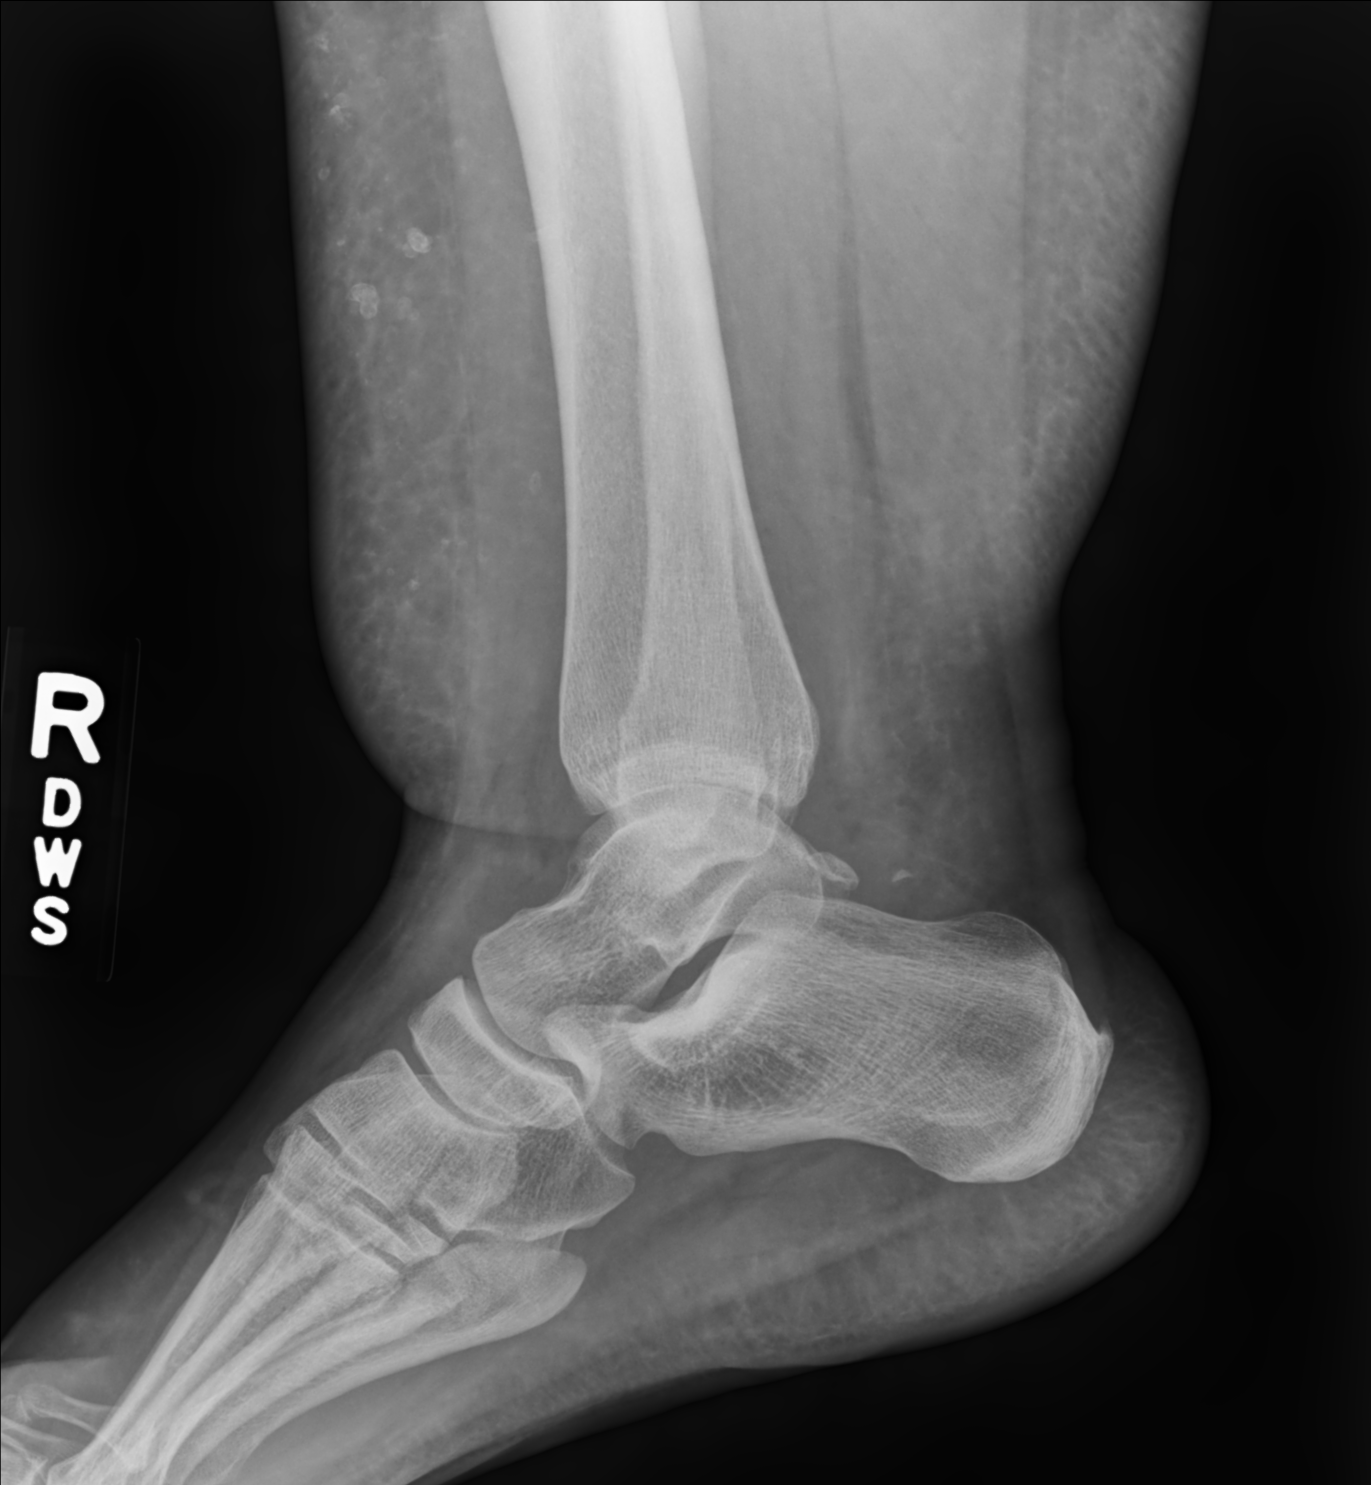

[3 of 3 positions shown; findings below may reference images not displayed]

FINDINGS: Frontal, oblique, and lateral views were obtained. There is
generalized soft tissue swelling. There is no appreciable fracture
or joint effusion. No appreciable joint space narrowing or erosion.
Ankle mortise appears intact. There are apparent phleboliths in the
soft tissues.
IMPRESSION: Diffuse soft tissue swelling. No evident fracture or appreciable
arthropathic change. Ankle mortise appears intact.

## 2020-12-29 ENCOUNTER — Encounter: Payer: Self-pay | Admitting: Allergy

## 2020-12-29 NOTE — Progress Notes (Deleted)
Follow Up Note  RE: [name] MRN: [id number] DOB: 04-24-1991 Date of Office Visit: 12/30/2020  Referring provider: [name], * Primary care provider: [name], FNP  Chief Complaint: No chief complaint on file.  History of Present Illness: I had the pleasure of seeing Cassandra Tucker for a follow up visit at the [facility] on 12/29/2020. She is a 30 y.o. female, who is being followed for asthma on Fasenra and allergic rhinoconjunctivitis. Her previous allergy office visit was on 08/24/2020 with Dr. Selena Batten. Today is a regular follow up visit.  Severe persistent asthma without complication Past history - Symptoms of chest tightness, shortness of breath, coughing, wheezing, nocturnal awakenings for 1 year. History of reflux but no longer on PPI. Ex-smoker. 2021 spirometry showed: normal pattern and 13% improvement in FEV1 post bronchodilator treatment. Clinically feeling better. Interim history - not using any daily maintenance inhalers. Well-controlled with Fasenra injections. Today's spirometry showed some mild restriction most likely due to body habitus.  Continue Fasenra injections at home every 8 weeks. Daily controller medication(s):  Singulair (montelukast) 10mg  daily at night. May use albuterol rescue inhaler 2 puffs or nebulizer every 4 to 6 hours as needed for shortness of breath, chest tightness, coughing, and wheezing. May use albuterol rescue inhaler 2 puffs 5 to 15 minutes prior to strenuous physical activities. Monitor frequency of use. During upper respiratory infections/asthma flares: start Symbicort 2 puffs twice a day with spacer and rinse mouth afterwards for 1-2 weeks until your breathing symptoms return to baseline.  Get spirometry at next visit.    Seasonal and perennial allergic rhinoconjunctivitis Past history - Perennial rhino conjunctivitis symptoms for 20+ years with worsening in the spring. No previous ENT  evaluation. Tried Claritin, Flonase and Zyrtec with some benefit. 2021 skin testing showed: Positive to grass, dust mites, cats, feathers. Negative to common foods. Interim history - dry nares, interested in [place] but [place] away. Continue environmental control measures as below. Continue Singulair (montelukast) 10mg  daily at night. May use over the counter antihistamines such as Zyrtec (cetirizine), Claritin (loratadine), Allegra (fexofenadine), or Xyzal (levocetirizine) daily. May take it twice a day if needed. May use Flonase (fluticasone) nasal spray 1 spray per nostril twice a day as needed for nasal congestion. Read about allergy injections - handout given. This may be given at local PCP's office if they are willing to administer.    Return in about 4 months (around 12/24/2020).  Assessment and Plan: Cassandra Tucker is a 30 y.o. female with: No problem-specific Assessment & Plan notes found for this encounter.  No follow-ups on file.  No orders of the defined types were placed in this encounter.  Lab Orders  No laboratory test(s) ordered today    Diagnostics: Spirometry:  Tracings reviewed. Her effort: {Blank single:19197::"Good reproducible efforts.","It was hard to get consistent efforts and there is a question as to whether this reflects a maximal maneuver.","Poor effort, data can not be interpreted."} FVC: ***L FEV1: ***L, ***% predicted FEV1/FVC ratio: ***% Interpretation: {Blank single:19197::"Spirometry consistent with mild obstructive disease","Spirometry consistent with moderate obstructive disease","Spirometry consistent with severe obstructive disease","Spirometry consistent with possible restrictive disease","Spirometry consistent with mixed obstructive and restrictive disease","Spirometry uninterpretable due to technique","Spirometry consistent with normal pattern","No overt abnormalities noted given today's efforts"}.  Please see scanned spirometry results for  details.  Skin Testing: {Blank single:19197::"Select foods","Environmental allergy panel","Environmental allergy panel and select foods","Food allergy panel","None","Deferred due to recent antihistamines use"}. Positive test to: ***. Negative test to: ***.  Results discussed with patient/family.   Medication List:  Current Outpatient Medications  Medication Sig Dispense Refill   albuterol (PROVENTIL) (2.5 MG/3ML) 0.083% nebulizer solution Take 3 mLs (2.5 mg total) by nebulization every 4 (four) hours as needed for wheezing or shortness of breath. 75 mL 1   albuterol (VENTOLIN HFA) 108 (90 Base) MCG/ACT inhaler Inhale 2 puffs into the lungs every 4 (four) hours as needed for wheezing or shortness of breath (coughing fits). 8 g 2   budesonide-formoterol (SYMBICORT) 160-4.5 MCG/ACT inhaler Inhale 2 puffs into the lungs in the morning and at bedtime. with spacer and rinse mouth afterwards. 1 Inhaler 5   diclofenac (VOLTAREN) 75 MG EC tablet Take 1 tablet (75 mg total) by mouth 2 (two) times daily. 60 tablet 1   FASENRA PEN 30 MG/ML SOAJ INJECT 1 PEN UNDER THE SKIN AT WEEK 0, 4 AND 8, THEN INJECT 1 PEN EVERY 8 WEEKS THEREAFTER. 1 mL 7   ibuprofen (ADVIL) 800 MG tablet Take 1 tablet (800 mg total) by mouth 3 (three) times daily. 21 tablet 0   ipratropium-albuterol (DUONEB) 0.5-2.5 (3) MG/3ML SOLN ipratropium 0.5 mg-albuterol 3 mg (2.5 mg base)/3 mL nebulization soln  USE 1 VIAL VIA NEBULIZER EVERY 6 HOURS AS NEEDED     levonorgestrel (MIRENA, 52 MG,) 20 MCG/24HR IUD Mirena 20 mcg/24 hours (6 yrs) 52 mg intrauterine device  Take 1 device by intrauterine route.     loratadine (CLARITIN) 10 MG tablet TAKE 1 TABLET(10 MG) BY MOUTH DAILY 90 tablet 1   NIFEdipine (ADALAT CC) 60 MG 24 hr tablet Take 60 mg by mouth every morning.      VENTOLIN HFA 108 (90 Base) MCG/ACT inhaler INHALE 2 PUFFS INTO THE LUNGS EVERY 6 HOURS AS NEEDED FOR WHEEZING OR SHORTNESS OF BREATH 18 g 1   No current facility-administered  medications for this visit.   Allergies: Allergies  Allergen Reactions   Cats Claw (Uncaria Tomentosa) Itching, Shortness Of Breath and Swelling   Dust Mite Extract Itching and Shortness Of Breath   Grass Pollen(K-O-R-T-Swt Vern) Itching and Shortness Of Breath   Mixed Feathers Itching, Shortness Of Breath and Swelling   I reviewed her past medical history, social history, family history, and environmental history and no significant changes have been reported from her previous visit.  Review of Systems  Constitutional:  Negative for appetite change, chills, fever and unexpected weight change.  HENT:  Negative for congestion, postnasal drip, rhinorrhea and sneezing.   Eyes:  Negative for itching.  Respiratory:  Negative for cough, chest tightness, shortness of breath and wheezing.   Cardiovascular:  Negative for chest pain.  Gastrointestinal:  Negative for abdominal pain.  Genitourinary:  Negative for difficulty urinating.  Skin:  Negative for rash.  Allergic/Immunologic: Positive for environmental allergies. Negative for food allergies.  Neurological:  Negative for headaches.   Objective: There were no vitals taken for this visit. There is no height or weight on file to calculate BMI. Physical Exam Vitals and nursing note reviewed.  Constitutional:      Appearance: Normal appearance. She is well-developed. She is obese.  HENT:     Head: Normocephalic and atraumatic.     Right Ear: Tympanic membrane and external ear normal.     Left Ear: Tympanic membrane and external ear normal.     Nose: Nose normal.     Mouth/Throat:     Mouth: Mucous membranes are moist.     Pharynx: Oropharynx is clear.  Eyes:  Conjunctiva/sclera: Conjunctivae normal.  Cardiovascular:     Rate and Rhythm: Normal rate and regular rhythm.     Heart sounds: Normal heart sounds. No murmur heard.   No friction rub. No gallop.  Pulmonary:     Effort: Pulmonary effort is normal.     Breath sounds: No  wheezing, rhonchi or rales.  Musculoskeletal:     Cervical back: Neck supple.  Skin:    General: Skin is warm.     Findings: No rash.  Neurological:     Mental Status: She is alert and oriented to person, place, and time.  Psychiatric:        Behavior: Behavior normal.   Previous notes and tests were reviewed. The plan was reviewed with the patient/family, and all questions/concerned were addressed.  It was my pleasure to see Cassandra Tucker today and participate in her care. Please feel free to contact me with any questions or concerns.  Sincerely,  Wyline Mood, DO Allergy & Immunology  [facility] office: [phone] [facility] office: [phone]

## 2020-12-30 ENCOUNTER — Ambulatory Visit: Payer: Medicaid Other | Admitting: Allergy

## 2021-01-17 ENCOUNTER — Encounter: Payer: Self-pay | Admitting: Allergy

## 2021-01-18 ENCOUNTER — Ambulatory Visit: Payer: Medicaid Other | Admitting: Allergy

## 2021-01-30 ENCOUNTER — Encounter: Payer: Self-pay | Admitting: Emergency Medicine

## 2021-01-30 ENCOUNTER — Ambulatory Visit
Admission: EM | Admit: 2021-01-30 | Discharge: 2021-01-30 | Disposition: A | Discharge status: Home / Self Care | Payer: Medicaid Other | Attending: Urgent Care | Admitting: Urgent Care

## 2021-01-30 ENCOUNTER — Other Ambulatory Visit: Payer: Self-pay

## 2021-01-30 DIAGNOSIS — E119 Type 2 diabetes mellitus without complications: Secondary | ICD-10-CM

## 2021-01-30 DIAGNOSIS — R112 Nausea with vomiting, unspecified: Secondary | ICD-10-CM

## 2021-01-30 DIAGNOSIS — A084 Viral intestinal infection, unspecified: Secondary | ICD-10-CM | POA: Diagnosis not present

## 2021-01-30 MED ORDER — LOPERAMIDE HCL 2 MG PO CAPS: 2.0000 mg | ORAL_CAPSULE | Freq: Two times a day (BID) | ORAL | 0 refills | Status: DC | PRN

## 2021-01-30 MED ORDER — ONDANSETRON HCL 4 MG/2ML IJ SOLN
4.0000 mg | Freq: Once | INTRAMUSCULAR | Status: AC
  Administered 2021-01-30: 4 mg via INTRAMUSCULAR

## 2021-01-30 MED ORDER — ONDANSETRON 8 MG PO TBDP: 8.0000 mg | ORAL_TABLET | Freq: Three times a day (TID) | ORAL | 0 refills | Status: DC | PRN

## 2021-01-30 NOTE — ED Triage Notes (Signed)
Pt here for N/V/D starting around 0900 today; pt sts daughter has similar sx starting last night

## 2021-01-30 NOTE — ED Provider Notes (Signed)
[facility]   MRN: [id number] DOB: [birth year]  Subjective:   Cassandra Tucker is a 30 y.o. female presenting for acute onset this morning of nausea, vomiting, diarrhea, generalized abdominal pain.  Patient has had 1 sick contact with her daughter, has had very similar symptoms that started last night.  Patient symptoms started today.  Denies fever, runny or stuffy nose, sore throat, cough, chest pain, shortness of breath.  Patient is a type II diabetic, treated without insulin.  No recent antibiotic use, long distance travel, hospitalizations.  No history of GI issues.  No current facility-administered medications for this encounter.  Current Outpatient Medications:    albuterol (PROVENTIL) (2.5 MG/3ML) 0.083% nebulizer solution, Take 3 mLs (2.5 mg total) by nebulization every 4 (four) hours as needed for wheezing or shortness of breath., Disp: 75 mL, Rfl: 1   albuterol (VENTOLIN HFA) 108 (90 Base) MCG/ACT inhaler, Inhale 2 puffs into the lungs every 4 (four) hours as needed for wheezing or shortness of breath (coughing fits)., Disp: 8 g, Rfl: 2   budesonide-formoterol (SYMBICORT) 160-4.5 MCG/ACT inhaler, Inhale 2 puffs into the lungs in the morning and at bedtime. with spacer and rinse mouth afterwards., Disp: 1 Inhaler, Rfl: 5   diclofenac (VOLTAREN) 75 MG EC tablet, Take 1 tablet (75 mg total) by mouth 2 (two) times daily. (Patient not taking: Reported on 01/30/2021), Disp: 60 tablet, Rfl: 1   FASENRA PEN 30 MG/ML SOAJ, INJECT 1 PEN UNDER THE SKIN AT WEEK 0, 4 AND 8, THEN INJECT 1 PEN EVERY 8 WEEKS THEREAFTER., Disp: 1 mL, Rfl: 7   ibuprofen (ADVIL) 800 MG tablet, Take 1 tablet (800 mg total) by mouth 3 (three) times daily., Disp: 21 tablet, Rfl: 0   ipratropium-albuterol (DUONEB) 0.5-2.5 (3) MG/3ML SOLN, ipratropium 0.5 mg-albuterol 3 mg (2.5 mg base)/3 mL nebulization soln  USE 1 VIAL VIA NEBULIZER EVERY 6 HOURS AS NEEDED, Disp: , Rfl:    levonorgestrel (MIRENA, 52 MG,) 20  MCG/24HR IUD, Mirena 20 mcg/24 hours (6 yrs) 52 mg intrauterine device  Take 1 device by intrauterine route., Disp: , Rfl:    loratadine (CLARITIN) 10 MG tablet, TAKE 1 TABLET(10 MG) BY MOUTH DAILY, Disp: 90 tablet, Rfl: 1   NIFEdipine (ADALAT CC) 60 MG 24 hr tablet, Take 60 mg by mouth every morning. , Disp: , Rfl:    VENTOLIN HFA 108 (90 Base) MCG/ACT inhaler, INHALE 2 PUFFS INTO THE LUNGS EVERY 6 HOURS AS NEEDED FOR WHEEZING OR SHORTNESS OF BREATH, Disp: 18 g, Rfl: 1   Allergies  Allergen Reactions   Cats Claw (Uncaria Tomentosa) Itching, Shortness Of Breath and Swelling   Dust Mite Extract Itching and Shortness Of Breath   Grass Pollen(K-O-R-T-Swt Vern) Itching and Shortness Of Breath   Mixed Feathers Itching, Shortness Of Breath and Swelling    Past Medical History:  Diagnosis Date   Ankle fracture, left 2000   Anxiety    Arthritis    left wrist and left ankle   Asthma    Back pain    Back pain    Bronchitis    Depression    Diabetes (HCC) 01/11/2016   type II  history of   Family history of adverse reaction to anesthesia    gmother had problems with N/V    GERD (gastroesophageal reflux disease)    Headache    High cholesterol    History of kidney stones    HLD (hyperlipidemia)    HSV-2 infection    Hypertension  2013   Intracranial hypertension    psuedotumor cebrum   Joint pain    Joint pain    Kienbock's disease    Leg edema    OSA (obstructive sleep apnea)    cpap - does not know settings    Papilledema    Prediabetes    Pregnancy induced hypertension    Pseudotumor cerebri syndrome    RLS (restless legs syndrome)    Sinus tachycardia    SOB (shortness of breath)      Past Surgical History:  Procedure Laterality Date   CESAREAN SECTION N/A 07/17/2019   Procedure: CESAREAN SECTION;  Surgeon: Osborn Coho, MD;  Location: [facility];  Service: Obstetrics;  Laterality: N/A;   FRACTURE SURGERY  2005   ankle   FRACTURE SURGERY  2013   wrist (deteriorating  bone)   LAPAROSCOPIC GASTRIC SLEEVE RESECTION N/A 05/08/2017   Procedure: LAPAROSCOPIC GASTRIC SLEEVE RESECTION;  Surgeon: Berna Bue, MD;  Location: [facility];  Service: General;  Laterality: N/A;   LAPAROSCOPIC GASTRIC SLEEVE RESECTION  05/08/2017   UPPER GI ENDOSCOPY  05/08/2017   Procedure: UPPER GI ENDOSCOPY;  Surgeon: Berna Bue, MD;  Location: [facility];  Service: General;;   urethra stretch     WRIST SURGERY  2015    Family History  Problem Relation Age of Onset   Heart attack Mother    Hypertension Mother    Obesity Mother    Depression Mother    Anxiety disorder Mother    Bipolar disorder Mother    Allergic rhinitis Mother    Sleep apnea Mother    Cancer Father    Allergic rhinitis Father    Depression Father    Alcoholism Father    Allergic rhinitis Brother    Allergic rhinitis Maternal Grandmother    Migraines Neg Hx    Asthma Neg Hx    Eczema Neg Hx    Urticaria Neg Hx     Social History   Tobacco Use   Smoking status: Former    Packs/day: 1.00    Types: Cigarettes    Quit date: 10/05/2018    Years since quitting: 2.3   Smokeless tobacco: Never  Vaping Use   Vaping Use: Never used  Substance Use Topics   Alcohol use: Not Currently   Drug use: Not Currently    Types: Marijuana    ROS   Objective:   Vitals: BP (!) 163/103 (BP Location: Left Arm)   Pulse 98   Temp 98.1 F (36.7 C) (Oral)   Resp 18   SpO2 99%   Physical Exam Constitutional:      General: She is not in acute distress.    Appearance: Normal appearance. She is well-developed. She is obese. She is not ill-appearing, toxic-appearing or diaphoretic.  HENT:     Head: Normocephalic and atraumatic.     Nose: Nose normal.     Mouth/Throat:     Mouth: Mucous membranes are moist.  Eyes:     Extraocular Movements: Extraocular movements intact.     Pupils: Pupils are equal, round, and reactive to light.  Cardiovascular:     Rate and Rhythm: Normal rate and regular rhythm.      Pulses: Normal pulses.     Heart sounds: Normal heart sounds. No murmur heard.   No friction rub. No gallop.  Pulmonary:     Effort: Pulmonary effort is normal. No respiratory distress.     Breath sounds: Normal breath sounds. No  stridor. No wheezing, rhonchi or rales.  Abdominal:     General: There is no distension.     Palpations: Abdomen is soft. There is no mass.     Tenderness: There is abdominal tenderness (mild generalized abdominal pain, lower abdomen). There is no right CVA tenderness, left CVA tenderness, guarding or rebound.  Skin:    General: Skin is warm and dry.     Findings: No rash.  Neurological:     Mental Status: She is alert and oriented to person, place, and time.  Psychiatric:        Mood and Affect: Mood normal.        Behavior: Behavior normal.        Thought Content: Thought content normal.        Judgment: Judgment normal.      Assessment and Plan :   PDMP not reviewed this encounter.  1. Viral gastroenteritis   2. Nausea vomiting and diarrhea   3. Type 2 diabetes mellitus treated without insulin (HCC)     Will manage for suspected viral gastroenteritis with supportive care.  Recommended patient hydrate well, eat light meals and maintain electrolytes.  Will use Zofran and Imodium for nausea, vomiting and diarrhea. Counseled patient on potential for adverse effects with medications prescribed/recommended today, ER and return-to-clinic precautions discussed, patient verbalized understanding.    Wallis Bamberg, [place] 01/30/21 [phone]

## 2021-01-30 NOTE — Discharge Instructions (Addendum)

## 2021-02-11 ENCOUNTER — Other Ambulatory Visit: Payer: Self-pay | Admitting: *Deleted

## 2021-02-11 MED ORDER — PROAIR HFA 108 (90 BASE) MCG/ACT IN AERS: 2.0000 | INHALATION_SPRAY | Freq: Four times a day (QID) | RESPIRATORY_TRACT | 1 refills | Status: DC | PRN

## 2021-02-14 NOTE — Progress Notes (Deleted)
Follow Up Note  RE: Cassandra Tucker MRN: [id number] DOB: 1990/12/09 Date of Office Visit: 02/15/2021  Referring provider: [name], * Primary care provider: [name], FNP  Chief Complaint: No chief complaint on file.  History of Present Illness: I had the pleasure of seeing Cassandra Tucker for a follow up visit at the [facility] on 02/14/2021. She is a 30 y.o. female, who is being followed for asthma on Fasenra and allergic rhinoconjunctivitis. Her previous allergy office visit was on 08/24/2020 with Dr. Selena Batten. Today is a regular follow up visit.  Severe persistent asthma without complication Past history - Symptoms of chest tightness, shortness of breath, coughing, wheezing, nocturnal awakenings for 1 year. History of reflux but no longer on PPI. Ex-smoker. 2021 spirometry showed: normal pattern and 13% improvement in FEV1 post bronchodilator treatment. Clinically feeling better. Interim history - not using any daily maintenance inhalers. Well-controlled with Fasenra injections.  Today's spirometry showed some mild restriction most likely due to body habitus.  Continue Fasenra injections at home every 8 weeks.  Daily controller medication(s):  Singulair (montelukast) 10mg  daily at night. May use albuterol rescue inhaler 2 puffs or nebulizer every 4 to 6 hours as needed for shortness of breath, chest tightness, coughing, and wheezing. May use albuterol rescue inhaler 2 puffs 5 to 15 minutes prior to strenuous physical activities. Monitor frequency of use.  During upper respiratory infections/asthma flares: start Symbicort 2 puffs twice a day with spacer and rinse mouth afterwards for 1-2 weeks until your breathing symptoms return to baseline.  Get spirometry at next visit.    Seasonal and perennial allergic rhinoconjunctivitis Past history - Perennial rhino conjunctivitis symptoms for 20+ years with worsening in the spring. No previous ENT  evaluation. Tried Claritin, Flonase and Zyrtec with some benefit. 2021 skin testing showed: Positive to grass, dust mites, cats, feathers. Negative to common foods.  Interim history - dry nares, [place] in [place] but [place] away. Continue environmental control measures as below. Continue Singulair (montelukast) 10mg  daily at night. May use over the counter antihistamines such as Zyrtec (cetirizine), Claritin (loratadine), Allegra (fexofenadine), or Xyzal (levocetirizine) daily. May take it twice a day if needed.  May use Flonase (fluticasone) nasal spray 1 spray per nostril twice a day as needed for nasal congestion.  Read about allergy injections - handout given.  This may be given at local PCP's office if they are willing to administer.    Return in about 4 months (around 12/24/2020).  Assessment and Plan: Cassandra Tucker is a 30 y.o. female with: No problem-specific Assessment & Plan notes found for this encounter.  No follow-ups on file.  No orders of the defined types were placed in this encounter.  Lab Orders  No laboratory test(s) ordered today    Diagnostics: Spirometry:  Tracings reviewed. Her effort: {Blank single:19197::"Good reproducible efforts.","It was hard to get consistent efforts and there is a question as to whether this reflects a maximal maneuver.","Poor effort, data can not be interpreted."} FVC: ***L FEV1: ***L, ***% predicted FEV1/FVC ratio: ***% Interpretation: {Blank single:19197::"Spirometry consistent with mild obstructive disease","Spirometry consistent with moderate obstructive disease","Spirometry consistent with severe obstructive disease","Spirometry consistent with possible restrictive disease","Spirometry consistent with mixed obstructive and restrictive disease","Spirometry uninterpretable due to technique","Spirometry consistent with normal pattern","No overt abnormalities noted given today's efforts"}.  Please see scanned spirometry results for  details.  Skin Testing: {Blank single:19197::"Select foods","Environmental allergy panel","Environmental allergy panel and select foods","Food allergy panel","None","Deferred due to recent antihistamines use"}. ***  Results discussed with patient/family.   Medication List:  Current Outpatient Medications  Medication Sig Dispense Refill  . albuterol (PROVENTIL) (2.5 MG/3ML) 0.083% nebulizer solution Take 3 mLs (2.5 mg total) by nebulization every 4 (four) hours as needed for wheezing or shortness of breath. 75 mL 1  . albuterol (VENTOLIN HFA) 108 (90 Base) MCG/ACT inhaler Inhale 2 puffs into the lungs every 4 (four) hours as needed for wheezing or shortness of breath (coughing fits). 8 g 2  . budesonide-formoterol (SYMBICORT) 160-4.5 MCG/ACT inhaler Inhale 2 puffs into the lungs in the morning and at bedtime. with spacer and rinse mouth afterwards. 1 Inhaler 5  . diclofenac (VOLTAREN) 75 MG EC tablet Take 1 tablet (75 mg total) by mouth 2 (two) times daily. (Patient not taking: Reported on 01/30/2021) 60 tablet 1  . FASENRA PEN 30 MG/ML SOAJ INJECT 1 PEN UNDER THE SKIN AT WEEK 0, 4 AND 8, THEN INJECT 1 PEN EVERY 8 WEEKS THEREAFTER. 1 mL 7  . ibuprofen (ADVIL) 800 MG tablet Take 1 tablet (800 mg total) by mouth 3 (three) times daily. 21 tablet 0  . ipratropium-albuterol (DUONEB) 0.5-2.5 (3) MG/3ML SOLN ipratropium 0.5 mg-albuterol 3 mg (2.5 mg base)/3 mL nebulization soln  USE 1 VIAL VIA NEBULIZER EVERY 6 HOURS AS NEEDED    . levonorgestrel (MIRENA, 52 MG,) 20 MCG/24HR IUD Mirena 20 mcg/24 hours (6 yrs) 52 mg intrauterine device  Take 1 device by intrauterine route.    . loperamide (IMODIUM) 2 MG capsule Take 1 capsule (2 mg total) by mouth 2 (two) times daily as needed for diarrhea or loose stools. 14 capsule 0  . loratadine (CLARITIN) 10 MG tablet TAKE 1 TABLET(10 MG) BY MOUTH DAILY 90 tablet 1  . NIFEdipine (ADALAT CC) 60 MG 24 hr tablet Take 60 mg by mouth every morning.     . ondansetron  (ZOFRAN-ODT) 8 MG disintegrating tablet Take 1 tablet (8 mg total) by mouth every 8 (eight) hours as needed for nausea or vomiting. 20 tablet 0  . PROAIR HFA 108 (90 Base) MCG/ACT inhaler Inhale 2 puffs into the lungs every 6 (six) hours as needed for wheezing or shortness of breath. 54 g 1  . VENTOLIN HFA 108 (90 Base) MCG/ACT inhaler INHALE 2 PUFFS INTO THE LUNGS EVERY 6 HOURS AS NEEDED FOR WHEEZING OR SHORTNESS OF BREATH 18 g 1   No current facility-administered medications for this visit.   Allergies: Allergies  Allergen Reactions  . Cats Claw (Uncaria Tomentosa) Itching, Shortness Of Breath and Swelling  . Dust Mite Extract Itching and Shortness Of Breath  . Grass Pollen(K-O-R-T-Swt Vern) Itching and Shortness Of Breath  . Mixed Feathers Itching, Shortness Of Breath and Swelling   I reviewed her past medical history, social history, family history, and environmental history and no significant changes have been reported from her previous visit.  Review of Systems  Constitutional:  Negative for appetite change, chills, fever and unexpected weight change.  HENT:  Negative for congestion, postnasal drip, rhinorrhea and sneezing.   Eyes:  Negative for itching.  Respiratory:  Negative for cough, chest tightness, shortness of breath and wheezing.   Cardiovascular:  Negative for chest pain.  Gastrointestinal:  Negative for abdominal pain.  Genitourinary:  Negative for difficulty urinating.  Skin:  Negative for rash.  Allergic/Immunologic: Positive for environmental allergies. Negative for food allergies.  Neurological:  Negative for headaches.   Objective: There were no vitals taken for this visit. There is no height or weight on  file to calculate BMI. Physical Exam Vitals and nursing note reviewed.  Constitutional:      Appearance: Normal appearance. She is well-developed. She is obese.  HENT:     Head: Normocephalic and atraumatic.     Right Ear: Tympanic membrane and external ear  normal.     Left Ear: Tympanic membrane and external ear normal.     Nose: Nose normal.     Mouth/Throat:     Mouth: Mucous membranes are moist.     Pharynx: Oropharynx is clear.  Eyes:     Conjunctiva/sclera: Conjunctivae normal.  Cardiovascular:     Rate and Rhythm: Normal rate and regular rhythm.     Heart sounds: Normal heart sounds. No murmur heard.   No friction rub. No gallop.  Pulmonary:     Effort: Pulmonary effort is normal.     Breath sounds: No wheezing, rhonchi or rales.  Musculoskeletal:     Cervical back: Neck supple.  Skin:    General: Skin is warm.     Findings: No rash.  Neurological:     Mental Status: She is alert and oriented to person, place, and time.  Psychiatric:        Behavior: Behavior normal.  Previous notes and tests were reviewed. The plan was reviewed with the patient/family, and all questions/concerned were addressed.  It was my pleasure to see Cassandra Tucker today and participate in her care. Please feel free to contact me with any questions or concerns.  Sincerely,  Wyline Mood, DO Allergy & Immunology  [facility] office: [phone] [facility] office: [phone]

## 2021-02-15 ENCOUNTER — Other Ambulatory Visit: Payer: Self-pay

## 2021-02-15 ENCOUNTER — Ambulatory Visit: Payer: Medicaid Other | Admitting: Allergy

## 2021-02-15 DIAGNOSIS — H1010 Acute atopic conjunctivitis, unspecified eye: Secondary | ICD-10-CM

## 2021-02-15 DIAGNOSIS — J4550 Severe persistent asthma, uncomplicated: Secondary | ICD-10-CM

## 2021-02-15 NOTE — Telephone Encounter (Signed)
Received letter from [facility] stating to patient wanted ventolin instead of Proair.

## 2021-02-23 NOTE — Telephone Encounter (Signed)
Informed pt that proair is preferred with the  medicaid she has pt stated understanding

## 2021-02-23 NOTE — Telephone Encounter (Signed)
Patient called back and said she went to pick up her inhaler and it was the wrong one. She said ProAir was called in and she uses Ventolin. Could this be changed?

## 2021-02-23 NOTE — Telephone Encounter (Signed)
Thank you :)

## 2021-03-16 NOTE — Progress Notes (Deleted)
Follow Up Note  RE: [name] MRN: [id number] DOB: 10/10/1990 Date of Office Visit: 03/17/2021  Referring provider: [name], * Primary care provider: [name], FNP  Chief Complaint: No chief complaint on file.  History of Present Illness: I had the pleasure of seeing Cassandra Tucker for a follow up visit at the [facility] on 03/16/2021. She is a 30 y.o. female, who is being followed for asthma and allergic rhinoconjunctivitis. Her previous allergy office visit was on 08/24/2020 with Dr. Selena Batten. Today is a regular follow up visit.  Severe persistent asthma without complication Past history - Symptoms of chest tightness, shortness of breath, coughing, wheezing, nocturnal awakenings for 1 year. History of reflux but no longer on PPI. Ex-smoker. 2021 spirometry showed: normal pattern and 13% improvement in FEV1 post bronchodilator treatment. Clinically feeling better. Interim history - not using any daily maintenance inhalers. Well-controlled with Fasenra injections.  Today's spirometry showed some mild restriction most likely due to body habitus.  Continue Fasenra injections at home every 8 weeks.  Daily controller medication(s):  Singulair (montelukast) 10mg  daily at night. May use albuterol rescue inhaler 2 puffs or nebulizer every 4 to 6 hours as needed for shortness of breath, chest tightness, coughing, and wheezing. May use albuterol rescue inhaler 2 puffs 5 to 15 minutes prior to strenuous physical activities. Monitor frequency of use.  During upper respiratory infections/asthma flares: start Symbicort 2 puffs twice a day with spacer and rinse mouth afterwards for 1-2 weeks until your breathing symptoms return to baseline.  Get spirometry at next visit.    Seasonal and perennial allergic rhinoconjunctivitis Past history - Perennial rhino conjunctivitis symptoms for 20+ years with worsening in the spring. No previous ENT  evaluation. Tried Claritin, Flonase and Zyrtec with some benefit. 2021 skin testing showed: Positive to grass, dust mites, cats, feathers. Negative to common foods.  Interim history - dry nares, interested in [place] but [place] away. Continue environmental control measures as below. Continue Singulair (montelukast) 10mg  daily at night. May use over the counter antihistamines such as Zyrtec (cetirizine), Claritin (loratadine), Allegra (fexofenadine), or Xyzal (levocetirizine) daily. May take it twice a day if needed.  May use Flonase (fluticasone) nasal spray 1 spray per nostril twice a day as needed for nasal congestion.  Read about allergy injections - handout given.  This may be given at local PCP's office if they are willing to administer.    Return in about 4 months (around 12/24/2020).  Assessment and Plan: Cassandra Tucker is a 30 y.o. female with: No problem-specific Assessment & Plan notes found for this encounter.  No follow-ups on file.  No orders of the defined types were placed in this encounter.  Lab Orders  No laboratory test(s) ordered today    Diagnostics: Spirometry:  Tracings reviewed. Her effort: {Blank single:19197::"Good reproducible efforts.","It was hard to get consistent efforts and there is a question as to whether this reflects a maximal maneuver.","Poor effort, data can not be interpreted."} FVC: ***L FEV1: ***L, ***% predicted FEV1/FVC ratio: ***% Interpretation: {Blank single:19197::"Spirometry consistent with mild obstructive disease","Spirometry consistent with moderate obstructive disease","Spirometry consistent with severe obstructive disease","Spirometry consistent with possible restrictive disease","Spirometry consistent with mixed obstructive and restrictive disease","Spirometry uninterpretable due to technique","Spirometry consistent with normal pattern","No overt abnormalities noted given today's efforts"}.  Please see scanned spirometry results for  details.  Skin Testing: {Blank single:19197::"Select foods","Environmental allergy panel","Environmental allergy panel and select foods","Food allergy panel","None","Deferred due to recent antihistamines use"}. *** Results  discussed with patient/family.   Medication List:  Current Outpatient Medications  Medication Sig Dispense Refill   albuterol (PROVENTIL) (2.5 MG/3ML) 0.083% nebulizer solution Take 3 mLs (2.5 mg total) by nebulization every 4 (four) hours as needed for wheezing or shortness of breath. 75 mL 1   albuterol (VENTOLIN HFA) 108 (90 Base) MCG/ACT inhaler Inhale 2 puffs into the lungs every 4 (four) hours as needed for wheezing or shortness of breath (coughing fits). 8 g 2   budesonide-formoterol (SYMBICORT) 160-4.5 MCG/ACT inhaler Inhale 2 puffs into the lungs in the morning and at bedtime. with spacer and rinse mouth afterwards. 1 Inhaler 5   diclofenac (VOLTAREN) 75 MG EC tablet Take 1 tablet (75 mg total) by mouth 2 (two) times daily. (Patient not taking: Reported on 01/30/2021) 60 tablet 1   FASENRA PEN 30 MG/ML SOAJ INJECT 1 PEN UNDER THE SKIN AT WEEK 0, 4 AND 8, THEN INJECT 1 PEN EVERY 8 WEEKS THEREAFTER. 1 mL 7   ibuprofen (ADVIL) 800 MG tablet Take 1 tablet (800 mg total) by mouth 3 (three) times daily. 21 tablet 0   ipratropium-albuterol (DUONEB) 0.5-2.5 (3) MG/3ML SOLN ipratropium 0.5 mg-albuterol 3 mg (2.5 mg base)/3 mL nebulization soln  USE 1 VIAL VIA NEBULIZER EVERY 6 HOURS AS NEEDED     levonorgestrel (MIRENA, 52 MG,) 20 MCG/24HR IUD Mirena 20 mcg/24 hours (6 yrs) 52 mg intrauterine device  Take 1 device by intrauterine route.     loperamide (IMODIUM) 2 MG capsule Take 1 capsule (2 mg total) by mouth 2 (two) times daily as needed for diarrhea or loose stools. 14 capsule 0   loratadine (CLARITIN) 10 MG tablet TAKE 1 TABLET(10 MG) BY MOUTH DAILY 90 tablet 1   NIFEdipine (ADALAT CC) 60 MG 24 hr tablet Take 60 mg by mouth every morning.      ondansetron (ZOFRAN-ODT) 8  MG disintegrating tablet Take 1 tablet (8 mg total) by mouth every 8 (eight) hours as needed for nausea or vomiting. 20 tablet 0   PROAIR HFA 108 (90 Base) MCG/ACT inhaler Inhale 2 puffs into the lungs every 6 (six) hours as needed for wheezing or shortness of breath. 54 g 1   VENTOLIN HFA 108 (90 Base) MCG/ACT inhaler INHALE 2 PUFFS INTO THE LUNGS EVERY 6 HOURS AS NEEDED FOR WHEEZING OR SHORTNESS OF BREATH 18 g 1   No current facility-administered medications for this visit.   Allergies: Allergies  Allergen Reactions   Cats Claw (Uncaria Tomentosa) Itching, Shortness Of Breath and Swelling   Dust Mite Extract Itching and Shortness Of Breath   Grass Pollen(K-O-R-T-Swt Vern) Itching and Shortness Of Breath   Mixed Feathers Itching, Shortness Of Breath and Swelling   I reviewed her past medical history, social history, family history, and environmental history and no significant changes have been reported from her previous visit.  Review of Systems  Constitutional:  Negative for appetite change, chills, fever and unexpected weight change.  HENT:  Negative for congestion, postnasal drip, rhinorrhea and sneezing.   Eyes:  Negative for itching.  Respiratory:  Negative for cough, chest tightness, shortness of breath and wheezing.   Cardiovascular:  Negative for chest pain.  Gastrointestinal:  Negative for abdominal pain.  Genitourinary:  Negative for difficulty urinating.  Skin:  Negative for rash.  Allergic/Immunologic: Positive for environmental allergies. Negative for food allergies.  Neurological:  Negative for headaches.   Objective: There were no vitals taken for this visit. There is no height or weight on file  to calculate BMI. Physical Exam Vitals and nursing note reviewed.  Constitutional:      Appearance: Normal appearance. She is well-developed. She is obese.  HENT:     Head: Normocephalic and atraumatic.     Right Ear: Tympanic membrane and external ear normal.     Left  Ear: Tympanic membrane and external ear normal.     Nose: Nose normal.     Mouth/Throat:     Mouth: Mucous membranes are moist.     Pharynx: Oropharynx is clear.  Eyes:     Conjunctiva/sclera: Conjunctivae normal.  Cardiovascular:     Rate and Rhythm: Normal rate and regular rhythm.     Heart sounds: Normal heart sounds. No murmur heard.   No friction rub. No gallop.  Pulmonary:     Effort: Pulmonary effort is normal.     Breath sounds: No wheezing, rhonchi or rales.  Musculoskeletal:     Cervical back: Neck supple.  Skin:    General: Skin is warm.     Findings: No rash.  Neurological:     Mental Status: She is alert and oriented to person, place, and time.  Psychiatric:        Behavior: Behavior normal.   Previous notes and tests were reviewed. The plan was reviewed with the patient/family, and all questions/concerned were addressed.  It was my pleasure to see Cassandra Tucker today and participate in her care. Please feel free to contact me with any questions or concerns.  Sincerely,  Wyline Mood, DO Allergy & Immunology  [facility] office: [phone] [facility] office: [phone]

## 2021-03-17 ENCOUNTER — Ambulatory Visit: Payer: Medicaid Other | Admitting: Allergy

## 2021-03-17 DIAGNOSIS — H1010 Acute atopic conjunctivitis, unspecified eye: Secondary | ICD-10-CM

## 2021-03-17 DIAGNOSIS — J4550 Severe persistent asthma, uncomplicated: Secondary | ICD-10-CM

## 2021-03-24 ENCOUNTER — Other Ambulatory Visit: Payer: Self-pay | Admitting: *Deleted

## 2021-03-24 ENCOUNTER — Telehealth: Payer: Self-pay | Admitting: Allergy

## 2021-03-24 MED ORDER — VENTOLIN HFA 108 (90 BASE) MCG/ACT IN AERS: 2.0000 | INHALATION_SPRAY | Freq: Four times a day (QID) | RESPIRATORY_TRACT | 0 refills | Status: DC | PRN

## 2021-03-24 NOTE — Telephone Encounter (Signed)
A courtesy refill of the Ventolin has been sent in to the requested pharmacy. Attempted to call patient but phone was out of service. Will need to attempt to call again.

## 2021-03-24 NOTE — Telephone Encounter (Signed)
Patient is requesting that her Pro Air be switched to Ventolin. She says it works better for her. Change of pharmacy: New Pharmacy is [facility] in [place]. She made an appointment for 06/07/21.

## 2021-03-29 ENCOUNTER — Other Ambulatory Visit: Payer: Self-pay

## 2021-03-29 NOTE — Telephone Encounter (Signed)
Called and left a detailed voicemail per DPR permission advising the patient of the refill.

## 2021-03-31 ENCOUNTER — Other Ambulatory Visit: Payer: Self-pay

## 2021-03-31 MED ORDER — LEVOCETIRIZINE DIHYDROCHLORIDE 5 MG PO TABS: 5.0000 mg | ORAL_TABLET | Freq: Every evening | ORAL | 4 refills | Status: DC

## 2021-03-31 NOTE — Telephone Encounter (Signed)
Received a fax for refill on Levocetirizine 5 mg tablets. Patient was last seen on 08/24/2020. Refills have been sent in.

## 2021-04-07 ENCOUNTER — Telehealth: Payer: Self-pay | Admitting: Nurse Practitioner

## 2021-04-07 NOTE — Telephone Encounter (Signed)
That is fine 

## 2021-04-21 ENCOUNTER — Ambulatory Visit: Payer: Medicaid Other | Admitting: Family Medicine

## 2021-04-21 ENCOUNTER — Encounter: Payer: Self-pay | Admitting: Family Medicine

## 2021-04-21 ENCOUNTER — Other Ambulatory Visit: Payer: Self-pay

## 2021-04-21 ENCOUNTER — Other Ambulatory Visit (HOSPITAL_COMMUNITY)
Admission: RE | Admit: 2021-04-21 | Discharge: 2021-04-21 | Disposition: A | Discharge status: Home / Self Care | Payer: Medicaid Other | Source: Ambulatory Visit | Attending: Family Medicine | Admitting: Family Medicine

## 2021-04-21 VITALS — BP 157/100 | HR 82 | Temp 97.7°F | Ht 67.0 in | Wt 347.0 lb

## 2021-04-21 DIAGNOSIS — E559 Vitamin D deficiency, unspecified: Secondary | ICD-10-CM

## 2021-04-21 DIAGNOSIS — Z113 Encounter for screening for infections with a predominantly sexual mode of transmission: Secondary | ICD-10-CM | POA: Diagnosis not present

## 2021-04-21 DIAGNOSIS — F339 Major depressive disorder, recurrent, unspecified: Secondary | ICD-10-CM | POA: Diagnosis not present

## 2021-04-21 DIAGNOSIS — E785 Hyperlipidemia, unspecified: Secondary | ICD-10-CM

## 2021-04-21 DIAGNOSIS — F411 Generalized anxiety disorder: Secondary | ICD-10-CM | POA: Diagnosis not present

## 2021-04-21 DIAGNOSIS — E1169 Type 2 diabetes mellitus with other specified complication: Secondary | ICD-10-CM

## 2021-04-21 DIAGNOSIS — K219 Gastro-esophageal reflux disease without esophagitis: Secondary | ICD-10-CM

## 2021-04-21 DIAGNOSIS — E1159 Type 2 diabetes mellitus with other circulatory complications: Secondary | ICD-10-CM

## 2021-04-21 DIAGNOSIS — I152 Hypertension secondary to endocrine disorders: Secondary | ICD-10-CM

## 2021-04-21 DIAGNOSIS — E119 Type 2 diabetes mellitus without complications: Secondary | ICD-10-CM | POA: Diagnosis not present

## 2021-04-21 DIAGNOSIS — M7989 Other specified soft tissue disorders: Secondary | ICD-10-CM | POA: Diagnosis not present

## 2021-04-21 DIAGNOSIS — I10 Essential (primary) hypertension: Secondary | ICD-10-CM | POA: Diagnosis not present

## 2021-04-21 MED ORDER — LISINOPRIL-HYDROCHLOROTHIAZIDE 10-12.5 MG PO TABS: 1.0000 | ORAL_TABLET | Freq: Every day | ORAL | 3 refills | Status: DC

## 2021-04-21 MED ORDER — FLUOXETINE HCL 20 MG PO CAPS: 20.0000 mg | ORAL_CAPSULE | Freq: Every day | ORAL | 3 refills | Status: DC

## 2021-04-21 NOTE — Patient Instructions (Signed)

## 2021-04-21 NOTE — Progress Notes (Signed)
Subjective:  Patient ID: Cassandra Tucker, female    DOB: 1990/12/11, 30 y.o.   MRN: [id number]  Patient Care Team: Chevis Pretty, FNP as PCP - General (Family Medicine)   Chief Complaint:  Annual Exam (Left arm pain/anxiety)   HPI: Cassandra Tucker is a 30 y.o. female presenting on 04/21/2021 for Annual Exam (Left arm pain/anxiety)   Patient presents today to reestablish care with primary care provider as she has not been seen in office in several years.  She does see her allergy and asthma specialist.  She has a history of hypertension and type 2 diabetes for which she is not taking medications.  She has ongoing symptoms of anxiety and depression.  She was on anxiety medications in the past during pregnancy, sertraline, but feels this was not beneficial so she stopped the medication.  She also has a history of hyperlipidemia and is not on statin therapy.  She has a history of vitamin D deficiency and is not on repletion therapy. She has a history of acid reflux but is currently not on medications and is asymptomatic.  Her biggest concern today is her worsening anxiety and depression.  She denies any SI or HI.  But does feel her symptoms are starting to affect her daily life.  Depression screen [facility] 2/9 04/21/2021 02/18/2020 10/10/2017 08/10/2017 07/06/2017  Decreased Interest 2 3 0 0 0  Down, Depressed, Hopeless 3 3 0 0 0  PHQ - 2 Score 5 6 0 0 0  Altered sleeping 2 3 - - -  Tired, decreased energy 3 3 - - -  Change in appetite 3 3 - - -  Feeling bad or failure about yourself  0 2 - - -  Trouble concentrating 3 3 - - -  Moving slowly or fidgety/restless 2 3 - - -  Suicidal thoughts 0 3 - - -  PHQ-9 Score 18 26 - - -  Difficult doing work/chores Somewhat difficult Extremely dIfficult - - -   GAD 7 : Generalized Anxiety Score 04/21/2021  Nervous, Anxious, on Edge 3  Control/stop worrying 2  Worry too much - different things 3  Trouble relaxing 3  Restless 3  Easily annoyed  or irritable 3  Afraid - awful might happen 0  Total GAD 7 Score 17      Relevant past medical, surgical, family, and social history reviewed and updated as indicated.  Allergies and medications reviewed and updated. Data reviewed: Chart in Epic.   Past Medical History:  Diagnosis Date   Ankle fracture, left 2000   Anxiety    Arthritis    left wrist and left ankle   Asthma    Back pain    Back pain    Bronchitis    Depression    Diabetes ([place]) 01/11/2016   type II  history of   Family history of adverse reaction to anesthesia    gmother had problems with N/V    GERD (gastroesophageal reflux disease)    Headache    High cholesterol    History of kidney stones    HLD (hyperlipidemia)    HSV-2 infection    Hypertension 2013   Intracranial hypertension    psuedotumor cebrum   Joint pain    Joint pain    Kienbock's disease    Leg edema    OSA (obstructive sleep apnea)    cpap - does not know settings    Papilledema    Prediabetes  Pregnancy induced hypertension    Pseudotumor cerebri syndrome    RLS (restless legs syndrome)    Sinus tachycardia    SOB (shortness of breath)     Past Surgical History:  Procedure Laterality Date   CESAREAN SECTION N/A 07/17/2019   Procedure: CESAREAN SECTION;  Surgeon: Everett Graff, MD;  Location: [place] [facility];  Service: Obstetrics;  Laterality: N/A;   FRACTURE SURGERY  2005   ankle   FRACTURE SURGERY  2013   wrist (deteriorating bone)   LAPAROSCOPIC GASTRIC SLEEVE RESECTION N/A 05/08/2017   Procedure: LAPAROSCOPIC GASTRIC SLEEVE RESECTION;  Surgeon: Clovis Riley, MD;  Location: [facility];  Service: General;  Laterality: N/A;   LAPAROSCOPIC GASTRIC SLEEVE RESECTION  05/08/2017   UPPER GI ENDOSCOPY  05/08/2017   Procedure: UPPER GI ENDOSCOPY;  Surgeon: Clovis Riley, MD;  Location: [facility];  Service: General;;   urethra stretch     WRIST SURGERY  2015    Social History   Socioeconomic History   Marital status: Married     Spouse name: Cecilie Kicks   Number of children: 0   Years of education: 12+   Highest education level: Not on file  Occupational History   Occupation: Solicitor: [employer]  Tobacco Use   Smoking status: Former    Packs/day: 1.00    Types: Cigarettes    Quit date: 10/05/2018    Years since quitting: 2.5   Smokeless tobacco: Never  Vaping Use   Vaping Use: Never used  Substance and Sexual Activity   Alcohol use: Not Currently   Drug use: Not Currently    Types: Marijuana   Sexual activity: Yes    Birth control/protection: I.U.D.    Comment: condoms previously  Other Topics Concern   Not on file  Social History Narrative   Lives with grandparents   Caffeine use: Drinks coffee/tea/soda- 20oz per day (none after starting diamox)   Social Determinants of Health   Financial Resource Strain: Not on file  Food Insecurity: Not on file  Transportation Needs: Not on file  Physical Activity: Not on file  Stress: Not on file  Social Connections: Not on file  Intimate Partner Violence: Not on file    Outpatient Encounter Medications as of 04/21/2021  Medication Sig   albuterol (PROVENTIL) (2.5 MG/3ML) 0.083% nebulizer solution Take 3 mLs (2.5 mg total) by nebulization every 4 (four) hours as needed for wheezing or shortness of breath.   albuterol (VENTOLIN HFA) 108 (90 Base) MCG/ACT inhaler Inhale 2 puffs into the lungs every 4 (four) hours as needed for wheezing or shortness of breath (coughing fits).   FASENRA PEN 30 MG/ML SOAJ INJECT 1 PEN UNDER THE SKIN AT WEEK 0, 4 AND 8, THEN INJECT 1 PEN EVERY 8 WEEKS THEREAFTER.   FLUoxetine (PROZAC) 20 MG capsule Take 1 capsule (20 mg total) by mouth daily.   levocetirizine (XYZAL) 5 MG tablet Take 1 tablet (5 mg total) by mouth every evening.   levonorgestrel (MIRENA, 52 MG,) 20 MCG/24HR IUD Mirena 20 mcg/24 hours (6 yrs) 52 mg intrauterine device  Take 1 device by intrauterine route.   lisinopril-hydrochlorothiazide (ZESTORETIC)  10-12.5 MG tablet Take 1 tablet by mouth daily.   VENTOLIN HFA 108 (90 Base) MCG/ACT inhaler Inhale 2 puffs into the lungs every 6 (six) hours as needed for wheezing or shortness of breath.   [DISCONTINUED] levonorgestrel (MIRENA, 52 MG,) 20 MCG/DAY IUD Mirena 20 mcg/24 hours (8 yrs) 52 mg intrauterine device  Take 1 device by intrauterine route.   [DISCONTINUED] loratadine (CLARITIN) 10 MG tablet TAKE 1 TABLET(10 MG) BY MOUTH DAILY   NIFEdipine (ADALAT CC) 60 MG 24 hr tablet Take 60 mg by mouth every morning.    [DISCONTINUED] budesonide-formoterol (SYMBICORT) 160-4.5 MCG/ACT inhaler Inhale 2 puffs into the lungs in the morning and at bedtime. with spacer and rinse mouth afterwards.   [DISCONTINUED] cetirizine (ZYRTEC) 10 MG tablet Take 10 mg by mouth daily.   [DISCONTINUED] diclofenac (VOLTAREN) 75 MG EC tablet Take 1 tablet (75 mg total) by mouth 2 (two) times daily. (Patient not taking: Reported on 01/30/2021)   [DISCONTINUED] fluticasone (FLONASE) 50 MCG/ACT nasal spray Place 2 sprays into both nostrils daily.   [DISCONTINUED] ibuprofen (ADVIL) 800 MG tablet Take 1 tablet (800 mg total) by mouth 3 (three) times daily.   [DISCONTINUED] ipratropium-albuterol (DUONEB) 0.5-2.5 (3) MG/3ML SOLN ipratropium 0.5 mg-albuterol 3 mg (2.5 mg base)/3 mL nebulization soln  USE 1 VIAL VIA NEBULIZER EVERY 6 HOURS AS NEEDED   [DISCONTINUED] levocetirizine (XYZAL) 5 MG tablet levocetirizine 5 mg tablet   [DISCONTINUED] loperamide (IMODIUM) 2 MG capsule Take 1 capsule (2 mg total) by mouth 2 (two) times daily as needed for diarrhea or loose stools.   [DISCONTINUED] ondansetron (ZOFRAN-ODT) 8 MG disintegrating tablet Take 1 tablet (8 mg total) by mouth every 8 (eight) hours as needed for nausea or vomiting.   [DISCONTINUED] PROAIR HFA 108 (90 Base) MCG/ACT inhaler Inhale 2 puffs into the lungs every 6 (six) hours as needed for wheezing or shortness of breath.   No facility-administered encounter medications on  file as of 04/21/2021.    Allergies  Allergen Reactions   Cats Claw (Uncaria Tomentosa) Itching, Shortness Of Breath and Swelling   Dust Mite Extract Itching and Shortness Of Breath   Grass Pollen(K-O-R-T-Swt Vern) Itching and Shortness Of Breath   Mixed Feathers Itching, Shortness Of Breath and Swelling    Review of Systems  Constitutional:  Positive for activity change, appetite change, fatigue and unexpected weight change. Negative for chills, diaphoresis and fever.  Eyes:  Negative for photophobia and visual disturbance.  Respiratory:  Negative for cough, chest tightness and shortness of breath.   Cardiovascular:  Positive for leg swelling. Negative for chest pain and palpitations.  Gastrointestinal:  Negative for abdominal pain.  Endocrine: Positive for polydipsia. Negative for polyphagia and polyuria.  Genitourinary:  Negative for decreased urine volume and difficulty urinating.  Musculoskeletal:  Negative for arthralgias and back pain.  Neurological:  Negative for dizziness, tremors, seizures, syncope, facial asymmetry, speech difficulty, weakness, light-headedness, numbness and headaches.  Psychiatric/Behavioral:  Positive for agitation, decreased concentration, dysphoric mood and sleep disturbance. Negative for behavioral problems, confusion, hallucinations, self-injury and suicidal ideas. The patient is nervous/anxious. The patient is not hyperactive.   All other systems reviewed and are negative.      Objective:  BP (!) 157/100   Pulse 82   Temp 97.7 F (36.5 C)   Ht _0  (1.702 m)   Wt (!) 347 lb (157.4 kg)   SpO2 99%   BMI 54.35 kg/m    Wt Readings from Last 3 Encounters:  04/21/21 (!) 347 lb (157.4 kg)  11/23/20 (!) 349 lb 9.6 oz (158.6 kg)  05/05/20 (!) 320 lb (145.2 kg)    Physical Exam Vitals and nursing note reviewed.  Constitutional:      General: She is not in acute distress.    Appearance: Normal appearance. She is well-developed and well-groomed.  She is  morbidly obese. She is not ill-appearing, toxic-appearing or diaphoretic.  HENT:     Head: Normocephalic and atraumatic.     Jaw: There is normal jaw occlusion.     Right Ear: Hearing normal.     Left Ear: Hearing normal.     Nose: Nose normal.     Mouth/Throat:     Lips: Pink.     Mouth: Mucous membranes are moist.     Pharynx: Oropharynx is clear. Uvula midline.  Eyes:     General: Lids are normal.     Extraocular Movements: Extraocular movements intact.     Conjunctiva/sclera: Conjunctivae normal.     Pupils: Pupils are equal, round, and reactive to light.  Neck:     Thyroid: No thyroid mass, thyromegaly or thyroid tenderness.     Vascular: No carotid bruit or JVD.     Trachea: Trachea and phonation normal.  Cardiovascular:     Rate and Rhythm: Normal rate and regular rhythm.     Chest Wall: PMI is not displaced.     Pulses: Normal pulses.     Heart sounds: Normal heart sounds. No murmur heard.   No friction rub. No gallop.  Pulmonary:     Effort: Pulmonary effort is normal. No respiratory distress.     Breath sounds: Normal breath sounds. No wheezing.  Abdominal:     General: Bowel sounds are normal. There is no distension or abdominal bruit.     Palpations: Abdomen is soft. There is no hepatomegaly or splenomegaly.     Tenderness: There is no abdominal tenderness. There is no right CVA tenderness or left CVA tenderness.     Hernia: No hernia is present.  Musculoskeletal:        General: Normal range of motion.     Cervical back: Normal range of motion and neck supple.     Right lower leg: 2+ Edema present.     Left lower leg: 2+ Edema present.  Lymphadenopathy:     Cervical: No cervical adenopathy.  Skin:    General: Skin is warm and dry.     Capillary Refill: Capillary refill takes less than 2 seconds.     Coloration: Skin is not cyanotic, jaundiced or pale.     Findings: No rash.  Neurological:     General: No focal deficit present.     Mental Status: She  is alert and oriented to person, place, and time.     Sensory: Sensation is intact.     Motor: Motor function is intact.     Coordination: Coordination is intact.     Gait: Gait is intact.     Deep Tendon Reflexes: Reflexes are normal and symmetric.  Psychiatric:        Attention and Perception: Attention and perception normal.        Mood and Affect: Mood and affect normal.        Speech: Speech normal.        Behavior: Behavior normal. Behavior is cooperative.        Thought Content: Thought content normal.        Cognition and Memory: Cognition and memory normal.        Judgment: Judgment normal.    Results for orders placed or performed during the hospital encounter of 09/26/20  Culture, group A strep   Specimen: Throat  Result Value Ref Range   Specimen Description THROAT    Special Requests NONE    Culture  NO GROUP A STREP (S.PYOGENES) ISOLATED Performed at [facility] [address], [place], [place] [address]    Report Status 09/30/2020 FINAL   POCT rapid strep A  Result Value Ref Range   Rapid Strep A Screen Negative Negative       Pertinent labs & imaging results that were available during my care of the patient were reviewed by me and considered in my medical decision making.  Assessment & Plan:  Thamara was seen today for annual exam.  Diagnoses and all orders for this visit:  Controlled type 2 diabetes mellitus without complication, without long-term current use of insulin ([place]) Currently not on medications.  Upon chart review unsure if patient truly has type II but diabetes or if she is just prediabetic.  Further treatment pending lab results. -     CBC with Differential/Platelet -     CMP14+EGFR -     Microalbumin / creatinine urine ratio -     Bayer DCA Hb A1c Waived  Morbid obesity (HCC) Diet and exercise discussed in detail.  Labs pending. -     CBC with Differential/Platelet -     CMP14+EGFR -     Lipid panel -     Thyroid Panel  With TSH -     Bayer DCA Hb A1c Waived  Vitamin D deficiency Labs pending.  Will reinitiate repletion therapy if warranted. -     VITAMIN D 25 Hydroxy (Vit-D Deficiency, Fractures)  Hypertension associated with type 2 diabetes mellitus ([place]) Uncontrolled in office today with bilateral lower extremity swelling.  Will initiate lisinopril with HCTZ today.  Labs pending. DASH diet and exercise encouraged.  Follow-up in office in 2 weeks for reevaluation.  Monitor blood pressure and report any persistent high readings. -     CBC with Differential/Platelet -     CMP14+EGFR -     Lipid panel -     Thyroid Panel With TSH -     Microalbumin / creatinine urine ratio -     lisinopril-hydrochlorothiazide (ZESTORETIC) 10-12.5 MG tablet; Take 1 tablet by mouth daily.  GAD (generalized anxiety disorder) Depression, recurrent ([place]) Ongoing and worsening symptoms without SI or HI.  Will check thyroid function.  Will initiate fluoxetine.  Reevaluation in 2 weeks. -     Thyroid Panel With TSH -     FLUoxetine (PROZAC) 20 MG capsule; Take 1 capsule (20 mg total) by mouth daily.  Leg swelling Lisinopril HCTZ initiated for uncontrolled hypertension.  Will reevaluate leg swelling at 2-week follow-up.  Avoid salt.  Elevate legs when sitting. -     lisinopril-hydrochlorothiazide (ZESTORETIC) 10-12.5 MG tablet; Take 1 tablet by mouth daily.  Routine screening for STI (sexually transmitted infection) Would like screening for STIs today.  Denies symptomology. -     HIV Antibody (routine testing w rflx) -     Hepatitis C antibody -     Urine cytology ancillary only  Gastroesophageal reflux disease without esophagitis No red flags present.  Currently not on medications and doing well.  Will check CBC. -     CBC with Differential/Platelet  Hyperlipidemia associated with type 2 diabetes mellitus ([place]) Lipid panel pending.  Will initiate medications if warranted.  Diet and exercise discussed in  detail.  Continue all other maintenance medications.  Follow up plan: Return in about 2 weeks (around 05/05/2021), or if symptoms worsen or fail to improve.   Continue healthy lifestyle choices, including diet (rich in fruits, vegetables, and lean  proteins, and low in salt and simple carbohydrates) and exercise (at least 30 minutes of moderate physical activity daily).  Educational handout given for health maintenance, depression   The above assessment and management plan was discussed with the patient. The patient verbalized understanding of and has agreed to the management plan. Patient is aware to call the clinic if they develop any new symptoms or if symptoms persist or worsen. Patient is aware when to return to the clinic for a follow-up visit. Patient educated on when it is appropriate to go to the emergency department.   Monia Pouch, FNP-C [facility] [phone]

## 2021-04-22 ENCOUNTER — Encounter: Payer: Self-pay | Admitting: Family Medicine

## 2021-04-22 LAB — CMP14+EGFR
ALT: 11 IU/L (ref 0–32)
AST: 10 IU/L (ref 0–40)
Albumin/Globulin Ratio: 1.7 (ref 1.2–2.2)
Albumin: 4.4 g/dL (ref 3.9–5.0)
Alkaline Phosphatase: 90 IU/L (ref 44–121)
BUN/Creatinine Ratio: 17 (ref 9–23)
BUN: 11 mg/dL (ref 6–20)
Bilirubin Total: 0.3 mg/dL (ref 0.0–1.2)
CO2: 24 mmol/L (ref 20–29)
Calcium: 9.3 mg/dL (ref 8.7–10.2)
Chloride: 104 mmol/L (ref 96–106)
Creatinine, Ser: 0.63 mg/dL (ref 0.57–1.00)
Globulin, Total: 2.6 g/dL (ref 1.5–4.5)
Glucose: 119 mg/dL — ABNORMAL HIGH (ref 70–99)
Potassium: 4.5 mmol/L (ref 3.5–5.2)
Sodium: 142 mmol/L (ref 134–144)
Total Protein: 7 g/dL (ref 6.0–8.5)
eGFR: 123 mL/min/{1.73_m2} (ref >59)

## 2021-04-22 LAB — LIPID PANEL
Chol/HDL Ratio: 6 ratio — ABNORMAL HIGH (ref 0.0–4.4)
Cholesterol, Total: 211 mg/dL — ABNORMAL HIGH (ref 100–199)
HDL: 35 mg/dL — ABNORMAL LOW (ref >39)
LDL Chol Calc (NIH): 135 mg/dL — ABNORMAL HIGH (ref 0–99)
Triglycerides: 230 mg/dL — ABNORMAL HIGH (ref 0–149)
VLDL Cholesterol Cal: 41 mg/dL — ABNORMAL HIGH (ref 5–40)

## 2021-04-22 LAB — HIV ANTIBODY (ROUTINE TESTING W REFLEX): HIV Screen 4th Generation wRfx: NONREACTIVE

## 2021-04-22 LAB — CBC WITH DIFFERENTIAL/PLATELET
Basophils Absolute: 0 10*3/uL (ref 0.0–0.2)
Basos: 0 %
EOS (ABSOLUTE): 0 10*3/uL (ref 0.0–0.4)
Eos: 0 %
Hematocrit: 39.4 % (ref 34.0–46.6)
Hemoglobin: 12.2 g/dL (ref 11.1–15.9)
Immature Grans (Abs): 0.1 10*3/uL (ref 0.0–0.1)
Immature Granulocytes: 1 %
Lymphocytes Absolute: 2.6 10*3/uL (ref 0.7–3.1)
Lymphs: 30 %
MCH: 23.5 pg — ABNORMAL LOW (ref 26.6–33.0)
MCHC: 31 g/dL — ABNORMAL LOW (ref 31.5–35.7)
MCV: 76 fL — ABNORMAL LOW (ref 79–97)
Monocytes Absolute: 0.4 10*3/uL (ref 0.1–0.9)
Monocytes: 5 %
Neutrophils Absolute: 5.4 10*3/uL (ref 1.4–7.0)
Neutrophils: 64 %
Platelets: 299 10*3/uL (ref 150–450)
RBC: 5.2 x10E6/uL (ref 3.77–5.28)
RDW: 14.2 % (ref 11.7–15.4)
WBC: 8.4 10*3/uL (ref 3.4–10.8)

## 2021-04-22 LAB — THYROID PANEL WITH TSH
Free Thyroxine Index: 2.3 (ref 1.2–4.9)
T3 Uptake Ratio: 27 % (ref 24–39)
T4, Total: 8.5 ug/dL (ref 4.5–12.0)
TSH: 2.08 u[IU]/mL (ref 0.450–4.500)

## 2021-04-22 LAB — VITAMIN D 25 HYDROXY (VIT D DEFICIENCY, FRACTURES): Vit D, 25-Hydroxy: 25.4 ng/mL — ABNORMAL LOW (ref 30.0–100.0)

## 2021-04-22 LAB — MICROALBUMIN / CREATININE URINE RATIO
Creatinine, Urine: 92 mg/dL
Microalb/Creat Ratio: 14 mg/g creat (ref 0–29)
Microalbumin, Urine: 12.6 ug/mL

## 2021-04-22 LAB — BAYER DCA HB A1C WAIVED: HB A1C (BAYER DCA - WAIVED): 5.7 % — ABNORMAL HIGH (ref 4.8–5.6)

## 2021-04-22 LAB — HEPATITIS C ANTIBODY: Hep C Virus Ab: 0.1 s/co ratio (ref 0.0–0.9)

## 2021-04-22 MED ORDER — TIRZEPATIDE 2.5 MG/0.5ML ~~LOC~~ SOPN
2.5000 mg | PEN_INJECTOR | SUBCUTANEOUS | 0 refills | Status: AC
Start: 2021-04-22 — End: 2021-05-14

## 2021-04-22 MED ORDER — TIRZEPATIDE 5 MG/0.5ML ~~LOC~~ SOAJ: 5.0000 mg | SUBCUTANEOUS | 0 refills | Status: AC

## 2021-04-22 MED ORDER — PRAVASTATIN SODIUM 20 MG PO TABS: 20.0000 mg | ORAL_TABLET | Freq: Every day | ORAL | 3 refills | Status: DC

## 2021-04-22 NOTE — Addendum Note (Signed)
Addended by: Sonny Masters on: 04/22/2021 03:16 PM   Modules accepted: Orders

## 2021-04-25 LAB — URINE CYTOLOGY ANCILLARY ONLY
Bacterial Vaginitis-Urine: NEGATIVE
Candida Urine: NEGATIVE
Chlamydia: NEGATIVE
Comment: NEGATIVE
Comment: NEGATIVE
Comment: NORMAL
Neisseria Gonorrhea: NEGATIVE
Trichomonas: NEGATIVE

## 2021-04-26 ENCOUNTER — Telehealth: Payer: Self-pay | Admitting: *Deleted

## 2021-04-26 DIAGNOSIS — E119 Type 2 diabetes mellitus without complications: Secondary | ICD-10-CM

## 2021-04-26 NOTE — Telephone Encounter (Signed)
Approvedtoday PA Case: [id number], Status: Approved, Coverage Starts on: 04/26/2021 12:00:00 AM, Coverage Ends on: 04/26/2022 12:00:00 AM.  Pt and pharm aware

## 2021-04-26 NOTE — Telephone Encounter (Signed)
Mounjaro 2.5MG /0.5ML pen-injectors Form [employer] [place] Form [phone] NCPDP) Original Claim Info  Key: [id number]   May have to try:  tried and failed TWO formulary preferred GLP-1 receptor agonists: Bydureon pen, Byetta pen, Trulicity pen, Victoza pen, or Ozempic injection Will run PA to see   Sent to plan    PA for Mounjaro 0.5 not started at this time - due to waiting on lower dose PA response

## 2021-05-03 ENCOUNTER — Ambulatory Visit (INDEPENDENT_AMBULATORY_CARE_PROVIDER_SITE_OTHER): Payer: Medicaid Other | Admitting: Family Medicine

## 2021-05-03 ENCOUNTER — Other Ambulatory Visit: Payer: Self-pay

## 2021-05-03 ENCOUNTER — Encounter: Payer: Self-pay | Admitting: Family Medicine

## 2021-05-03 VITALS — BP 149/78 | HR 92 | Temp 97.5°F | Ht 67.0 in | Wt 337.0 lb

## 2021-05-03 DIAGNOSIS — F339 Major depressive disorder, recurrent, unspecified: Secondary | ICD-10-CM

## 2021-05-03 DIAGNOSIS — I152 Hypertension secondary to endocrine disorders: Secondary | ICD-10-CM

## 2021-05-03 DIAGNOSIS — E119 Type 2 diabetes mellitus without complications: Secondary | ICD-10-CM

## 2021-05-03 DIAGNOSIS — E1159 Type 2 diabetes mellitus with other circulatory complications: Secondary | ICD-10-CM

## 2021-05-03 DIAGNOSIS — K219 Gastro-esophageal reflux disease without esophagitis: Secondary | ICD-10-CM | POA: Diagnosis not present

## 2021-05-03 DIAGNOSIS — F411 Generalized anxiety disorder: Secondary | ICD-10-CM

## 2021-05-03 LAB — BMP8+EGFR
BUN/Creatinine Ratio: 20 (ref 9–23)
BUN: 14 mg/dL (ref 6–20)
CO2: 24 mmol/L (ref 20–29)
Calcium: 9.2 mg/dL (ref 8.7–10.2)
Chloride: 102 mmol/L (ref 96–106)
Creatinine, Ser: 0.69 mg/dL (ref 0.57–1.00)
Glucose: 158 mg/dL — ABNORMAL HIGH (ref 70–99)
Potassium: 4.4 mmol/L (ref 3.5–5.2)
Sodium: 139 mmol/L (ref 134–144)
eGFR: 120 mL/min/{1.73_m2} (ref >59)

## 2021-05-03 MED ORDER — FAMOTIDINE 20 MG PO TABS: 20.0000 mg | ORAL_TABLET | Freq: Two times a day (BID) | ORAL | 3 refills | Status: DC

## 2021-05-03 NOTE — Progress Notes (Signed)
Subjective:  Patient ID: Cassandra Tucker, female    DOB: 01-11-1991, 30 y.o.   MRN: [id number]  Patient Care Team: Baruch Gouty, FNP as PCP - General (Family Medicine)   Chief Complaint:  2 week f/u   HPI: Cassandra Tucker is a 30 y.o. female presenting on 05/03/2021 for 2 week f/u  Pt presents today for 2 week follow up after initiation of SSRI therapy for anxiety and depression, Mounjaro for T2DM and morbid obesity, and lisinopril/hctz for hypertension. Pt states she has been doing well with all medication additions. She does report slight heartburn with mounjaro therapy. States this occurs after a larger meal. She states her depression and anxiety has not changed. No SI or HI. Reports her blood pressure seems to be better controlled with addition of medications and leg swelling has decreased.   Depression screen [facility] 2/9 05/03/2021 04/21/2021 02/18/2020 10/10/2017 08/10/2017  Decreased Interest '2 2 3 ' 0 0  Down, Depressed, Hopeless '3 3 3 ' 0 0  PHQ - 2 Score '5 5 6 ' 0 0  Altered sleeping '3 2 3 ' - -  Tired, decreased energy '3 3 3 ' - -  Change in appetite '3 3 3 ' - -  Feeling bad or failure about yourself  2 0 2 - -  Trouble concentrating '3 3 3 ' - -  Moving slowly or fidgety/restless '3 2 3 ' - -  Suicidal thoughts 0 0 3 - -  PHQ-9 Score '22 18 26 ' - -  Difficult doing work/chores Somewhat difficult Somewhat difficult Extremely dIfficult - -   GAD 7 : Generalized Anxiety Score 05/03/2021 04/21/2021  Nervous, Anxious, on Edge 3 3  Control/stop worrying 3 2  Worry too much - different things 3 3  Trouble relaxing 3 3  Restless 3 3  Easily annoyed or irritable 3 3  Afraid - awful might happen 0 0  Total GAD 7 Score 18 17  Anxiety Difficulty Somewhat difficult -      Relevant past medical, surgical, family, and social history reviewed and updated as indicated.  Allergies and medications reviewed and updated. Data reviewed: Chart in Epic.   Past Medical History:  Diagnosis Date    Ankle fracture, left 2000   Anxiety    Arthritis    left wrist and left ankle   Asthma    Back pain    Back pain    Bronchitis    Depression    Diabetes ([place]) 01/11/2016   type II  history of   Family history of adverse reaction to anesthesia    gmother had problems with N/V    GERD (gastroesophageal reflux disease)    Headache    High cholesterol    History of kidney stones    HLD (hyperlipidemia)    HSV-2 infection    Hypertension 2013   Intracranial hypertension    psuedotumor cebrum   Joint pain    Joint pain    Kienbock's disease    Leg edema    OSA (obstructive sleep apnea)    cpap - does not know settings    Papilledema    Prediabetes    Pregnancy induced hypertension    Pseudotumor cerebri syndrome    RLS (restless legs syndrome)    Sinus tachycardia    SOB (shortness of breath)     Past Surgical History:  Procedure Laterality Date   CESAREAN SECTION N/A 07/17/2019   Procedure: CESAREAN SECTION;  Surgeon: Everett Graff, MD;  Location: [facility]  [facility] ORS;  Service: Obstetrics;  Laterality: N/A;   FRACTURE SURGERY  2005   ankle   FRACTURE SURGERY  2013   wrist (deteriorating bone)   LAPAROSCOPIC GASTRIC SLEEVE RESECTION N/A 05/08/2017   Procedure: LAPAROSCOPIC GASTRIC SLEEVE RESECTION;  Surgeon: Clovis Riley, MD;  Location: [facility] ORS;  Service: General;  Laterality: N/A;   LAPAROSCOPIC GASTRIC SLEEVE RESECTION  05/08/2017   UPPER GI ENDOSCOPY  05/08/2017   Procedure: UPPER GI ENDOSCOPY;  Surgeon: Clovis Riley, MD;  Location: [facility] ORS;  Service: General;;   urethra stretch     WRIST SURGERY  2015    Social History   Socioeconomic History   Marital status: Married    Spouse name: Cecilie Kicks   Number of children: 0   Years of education: 12+   Highest education level: Not on file  Occupational History   Occupation: Solicitor: [employer]  Tobacco Use   Smoking status: Former    Packs/day: 1.00    Types: Cigarettes    Quit date: 10/05/2018    Years  since quitting: 2.5   Smokeless tobacco: Never  Vaping Use   Vaping Use: Never used  Substance and Sexual Activity   Alcohol use: Not Currently   Drug use: Not Currently    Types: Marijuana   Sexual activity: Yes    Birth control/protection: I.U.D.    Comment: condoms previously  Other Topics Concern   Not on file  Social History Narrative   Lives with grandparents   Caffeine use: Drinks coffee/tea/soda- 20oz per day (none after starting diamox)   Social Determinants of Health   Financial Resource Strain: Not on file  Food Insecurity: Not on file  Transportation Needs: Not on file  Physical Activity: Not on file  Stress: Not on file  Social Connections: Not on file  Intimate Partner Violence: Not on file    Outpatient Encounter Medications as of 05/03/2021  Medication Sig   albuterol (PROVENTIL) (2.5 MG/3ML) 0.083% nebulizer solution Take 3 mLs (2.5 mg total) by nebulization every 4 (four) hours as needed for wheezing or shortness of breath.   albuterol (VENTOLIN HFA) 108 (90 Base) MCG/ACT inhaler Inhale 2 puffs into the lungs every 4 (four) hours as needed for wheezing or shortness of breath (coughing fits).   FASENRA PEN 30 MG/ML SOAJ INJECT 1 PEN UNDER THE SKIN AT WEEK 0, 4 AND 8, THEN INJECT 1 PEN EVERY 8 WEEKS THEREAFTER.   FLUoxetine (PROZAC) 20 MG capsule Take 1 capsule (20 mg total) by mouth daily.   levocetirizine (XYZAL) 5 MG tablet Take 1 tablet (5 mg total) by mouth every evening.   levonorgestrel (MIRENA, 52 MG,) 20 MCG/24HR IUD Mirena 20 mcg/24 hours (6 yrs) 52 mg intrauterine device  Take 1 device by intrauterine route.   lisinopril-hydrochlorothiazide (ZESTORETIC) 10-12.5 MG tablet Take 1 tablet by mouth daily.   pravastatin (PRAVACHOL) 20 MG tablet Take 1 tablet (20 mg total) by mouth daily.   tirzepatide [facility]) 2.5 MG/0.5ML Pen Inject 2.5 mg into the skin once a week for 4 doses.   tirzepatide [facility]) 5 MG/0.5ML Pen Inject 5 mg into the skin once a  week for 4 doses.   VENTOLIN HFA 108 (90 Base) MCG/ACT inhaler Inhale 2 puffs into the lungs every 6 (six) hours as needed for wheezing or shortness of breath.   famotidine (PEPCID) 20 MG tablet Take 1 tablet (20 mg total) by mouth 2 (two) times daily.   NIFEdipine (ADALAT CC) 60  MG 24 hr tablet Take 60 mg by mouth every morning.    [DISCONTINUED] cetirizine (ZYRTEC) 10 MG tablet Take 10 mg by mouth daily.   [DISCONTINUED] fluticasone (FLONASE) 50 MCG/ACT nasal spray Place 2 sprays into both nostrils daily.   No facility-administered encounter medications on file as of 05/03/2021.    Allergies  Allergen Reactions   Cats Claw (Uncaria Tomentosa) Itching, Shortness Of Breath and Swelling   Dust Mite Extract Itching and Shortness Of Breath   Grass Pollen(K-O-R-T-Swt Vern) Itching and Shortness Of Breath   Mixed Feathers Itching, Shortness Of Breath and Swelling    Review of Systems  Constitutional:  Positive for activity change, appetite change and fatigue. Negative for chills, diaphoresis, fever and unexpected weight change.  HENT: Negative.    Eyes: Negative.  Negative for photophobia and visual disturbance.  Respiratory:  Negative for cough, chest tightness and shortness of breath.   Cardiovascular:  Negative for chest pain, palpitations and leg swelling.  Gastrointestinal:  Negative for abdominal distention, abdominal pain, anal bleeding, blood in stool, constipation, diarrhea, nausea, rectal pain and vomiting.       Reflux  Endocrine: Negative.  Negative for polydipsia, polyphagia and polyuria.  Genitourinary:  Negative for decreased urine volume, difficulty urinating, dysuria, frequency and urgency.  Musculoskeletal:  Negative for arthralgias and myalgias.  Skin: Negative.   Allergic/Immunologic: Negative.   Neurological:  Negative for dizziness, tremors, seizures, syncope, facial asymmetry, speech difficulty, weakness, light-headedness, numbness and headaches.  Hematological:  Negative.   Psychiatric/Behavioral:  Positive for agitation, decreased concentration, dysphoric mood and sleep disturbance. Negative for behavioral problems, confusion, hallucinations, self-injury and suicidal ideas. The patient is nervous/anxious. The patient is not hyperactive.   All other systems reviewed and are negative.      Objective:  BP (!) 149/78   Pulse 92   Temp (!) 97.5 F (36.4 C)   Ht '5\' 7"'  (1.702 m)   Wt (!) 337 lb (152.9 kg)   SpO2 97%   BMI 52.78 kg/m    Wt Readings from Last 3 Encounters:  05/03/21 (!) 337 lb (152.9 kg)  04/21/21 (!) 347 lb (157.4 kg)  11/23/20 (!) 349 lb 9.6 oz (158.6 kg)    Physical Exam Vitals and nursing note reviewed.  Constitutional:      General: She is not in acute distress.    Appearance: Normal appearance. She is well-developed and well-groomed. She is morbidly obese. She is not ill-appearing, toxic-appearing or diaphoretic.  HENT:     Head: Normocephalic and atraumatic.     Jaw: There is normal jaw occlusion.     Right Ear: Hearing normal.     Left Ear: Hearing normal.     Nose: Nose normal.     Mouth/Throat:     Lips: Pink.     Mouth: Mucous membranes are moist.     Pharynx: Oropharynx is clear. Uvula midline.  Eyes:     General: Lids are normal.     Extraocular Movements: Extraocular movements intact.     Conjunctiva/sclera: Conjunctivae normal.     Pupils: Pupils are equal, round, and reactive to light.  Neck:     Thyroid: No thyroid mass, thyromegaly or thyroid tenderness.     Vascular: No carotid bruit or JVD.     Trachea: Trachea and phonation normal.  Cardiovascular:     Rate and Rhythm: Normal rate and regular rhythm.     Chest Wall: PMI is not displaced.     Pulses: Normal pulses.  Heart sounds: Normal heart sounds. No murmur heard.   No friction rub. No gallop.  Pulmonary:     Effort: Pulmonary effort is normal. No respiratory distress.     Breath sounds: Normal breath sounds. No wheezing.  Abdominal:      General: Bowel sounds are normal. There is no abdominal bruit.     Palpations: Abdomen is soft. There is no hepatomegaly or splenomegaly.     Tenderness: There is no abdominal tenderness.  Musculoskeletal:        General: Normal range of motion.     Cervical back: Normal range of motion and neck supple.     Right lower leg: No edema.     Left lower leg: No edema.  Lymphadenopathy:     Cervical: No cervical adenopathy.  Skin:    General: Skin is warm and dry.     Capillary Refill: Capillary refill takes less than 2 seconds.     Coloration: Skin is not cyanotic, jaundiced or pale.     Findings: No rash.  Neurological:     General: No focal deficit present.     Mental Status: She is alert and oriented to person, place, and time.     Sensory: Sensation is intact.     Motor: Motor function is intact.     Coordination: Coordination is intact.     Gait: Gait is intact.     Deep Tendon Reflexes: Reflexes are normal and symmetric.  Psychiatric:        Attention and Perception: Attention and perception normal.        Mood and Affect: Mood and affect normal.        Speech: Speech normal.        Behavior: Behavior normal. Behavior is cooperative.        Thought Content: Thought content normal.        Cognition and Memory: Cognition and memory normal.        Judgment: Judgment normal.    Results for orders placed or performed in visit on 04/21/21  CBC with Differential/Platelet  Result Value Ref Range   WBC 8.4 3.4 - 10.8 x10E3/uL   RBC 5.20 3.77 - 5.28 x10E6/uL   Hemoglobin 12.2 11.1 - 15.9 g/dL   Hematocrit 39.4 34.0 - 46.6 %   MCV 76 (L) 79 - 97 fL   MCH 23.5 (L) 26.6 - 33.0 pg   MCHC 31.0 (L) 31.5 - 35.7 g/dL   RDW 14.2 11.7 - 15.4 %   Platelets 299 150 - 450 x10E3/uL   Neutrophils 64 Not Estab. %   Lymphs 30 Not Estab. %   Monocytes 5 Not Estab. %   Eos 0 Not Estab. %   Basos 0 Not Estab. %   Neutrophils Absolute 5.4 1.4 - 7.0 x10E3/uL   Lymphocytes Absolute 2.6 0.7 -  3.1 x10E3/uL   Monocytes Absolute 0.4 0.1 - 0.9 x10E3/uL   EOS (ABSOLUTE) 0.0 0.0 - 0.4 x10E3/uL   Basophils Absolute 0.0 0.0 - 0.2 x10E3/uL   Immature Granulocytes 1 Not Estab. %   Immature Grans (Abs) 0.1 0.0 - 0.1 x10E3/uL  CMP14+EGFR  Result Value Ref Range   Glucose 119 (H) 70 - 99 mg/dL   BUN 11 6 - 20 mg/dL   Creatinine, Ser 0.63 0.57 - 1.00 mg/dL   eGFR 123 >59 mL/min/1.73   BUN/Creatinine Ratio 17 9 - 23   Sodium 142 134 - 144 mmol/L   Potassium 4.5 3.5 - 5.2 mmol/L  Chloride 104 96 - 106 mmol/L   CO2 24 20 - 29 mmol/L   Calcium 9.3 8.7 - 10.2 mg/dL   Total Protein 7.0 6.0 - 8.5 g/dL   Albumin 4.4 3.9 - 5.0 g/dL   Globulin, Total 2.6 1.5 - 4.5 g/dL   Albumin/Globulin Ratio 1.7 1.2 - 2.2   Bilirubin Total 0.3 0.0 - 1.2 mg/dL   Alkaline Phosphatase 90 44 - 121 IU/L   AST 10 0 - 40 IU/L   ALT 11 0 - 32 IU/L  Lipid panel  Result Value Ref Range   Cholesterol, Total 211 (H) 100 - 199 mg/dL   Triglycerides 230 (H) 0 - 149 mg/dL   HDL 35 (L) >39 mg/dL   VLDL Cholesterol Cal 41 (H) 5 - 40 mg/dL   LDL Chol Calc (NIH) 135 (H) 0 - 99 mg/dL   Chol/HDL Ratio 6.0 (H) 0.0 - 4.4 ratio  Thyroid Panel With TSH  Result Value Ref Range   TSH 2.080 0.450 - 4.500 uIU/mL   T4, Total 8.5 4.5 - 12.0 ug/dL   T3 Uptake Ratio 27 24 - 39 %   Free Thyroxine Index 2.3 1.2 - 4.9  VITAMIN D 25 Hydroxy (Vit-D Deficiency, Fractures)  Result Value Ref Range   Vit D, 25-Hydroxy 25.4 (L) 30.0 - 100.0 ng/mL  Microalbumin / creatinine urine ratio  Result Value Ref Range   Creatinine, Urine 92.0 Not Estab. mg/dL   Microalbumin, Urine 12.6 Not Estab. ug/mL   Microalb/Creat Ratio 14 0 - 29 mg/g creat  Bayer DCA Hb A1c Waived  Result Value Ref Range   HB A1C (BAYER DCA - WAIVED) 5.7 (H) 4.8 - 5.6 %  HIV Antibody (routine testing w rflx)  Result Value Ref Range   HIV Screen 4th Generation wRfx Non Reactive Non Reactive  Hepatitis C antibody  Result Value Ref Range   Hep C Virus Ab 0.1 0.0 - 0.9  s/co ratio  Urine cytology ancillary only  Result Value Ref Range   Neisseria Gonorrhea Negative    Chlamydia Negative    Trichomonas Negative    Bacterial Vaginitis-Urine Negative    Candida Urine Negative    Molecular Comment      This specimen does not meet the strict criteria set by the FDA. The   Molecular Comment      result interpretation should be considered in conjunction with the   Molecular Comment patient's clinical history.    Comment Normal Reference Range Trichomonas - Negative    Comment Normal Reference [place] Chlamydia - Negative    Comment      Normal Reference Range Neisseria Gonorrhea - Negative       Pertinent labs & imaging results that were available during my care of the patient were reviewed by me and considered in my medical decision making.  Assessment & Plan:  Tamra was seen today for 2 week f/u.  Diagnoses and all orders for this visit:  Depression, recurrent ([place]) GAD (generalized anxiety disorder) Tolerating SSRI therapy but has not noticed changes in symptoms. Pt aware to allow at least 6-8 weeks for improvement. Pt aware to report any new, worsening, or persistent symptoms. Follow up in 3 months for reevaluation or sooner if needed.   Hypertension associated with type 2 diabetes mellitus ([place]) Repeat 138/76. Better controlled with current regimen. DASH diet and exercise encouraged. Will recheck renal function today.  -     BMP8+EGFR  Gastroesophageal reflux disease without esophagitis No s/s of esophagitis.  Likely side effect of mounjaro. Will add Pepcid to regimen. Report any new or worsening symptoms.  -     famotidine (PEPCID) 20 MG tablet; Take 1 tablet (20 mg total) by mouth 2 (two) times daily.  Controlled T2DM and morbid obesity Tolerating Mounjaro therapy well. Has lost 10lbs. Follow up in 3 months for repeat A1C and BMI.    Continue all other maintenance medications.  Follow up plan: Return in about 3 months (around  08/03/2021), or if symptoms worsen or fail to improve, for BMI, lipids.   Continue healthy lifestyle choices, including diet (rich in fruits, vegetables, and lean proteins, and low in salt and simple carbohydrates) and exercise (at least 30 minutes of moderate physical activity daily).  Educational handout given for calorie counting for weight loss  The above assessment and management plan was discussed with the patient. The patient verbalized understanding of and has agreed to the management plan. Patient is aware to call the clinic if they develop any new symptoms or if symptoms persist or worsen. Patient is aware when to return to the clinic for a follow-up visit. Patient educated on when it is appropriate to go to the emergency department.   Monia Pouch, FNP-C [facility] [phone]

## 2021-05-17 ENCOUNTER — Ambulatory Visit (INDEPENDENT_AMBULATORY_CARE_PROVIDER_SITE_OTHER): Payer: Medicaid Other | Admitting: *Deleted

## 2021-05-17 DIAGNOSIS — Z23 Encounter for immunization: Secondary | ICD-10-CM

## 2021-05-17 NOTE — Progress Notes (Signed)
1st HPV given Kari Baars, FNP ordered due to high risk. No adverse effects

## 2021-05-21 IMAGING — DX DG FOOT COMPLETE 3+V*L*
3 series · 3 of 3 positions shown · non-contrast
Comparison: None.

CLINICAL DATA: Bruising and tenderness of the left foot starting
this morning. No injury.

EXAM:
LEFT FOOT - COMPLETE 3+ VIEW

[foot supine dp]
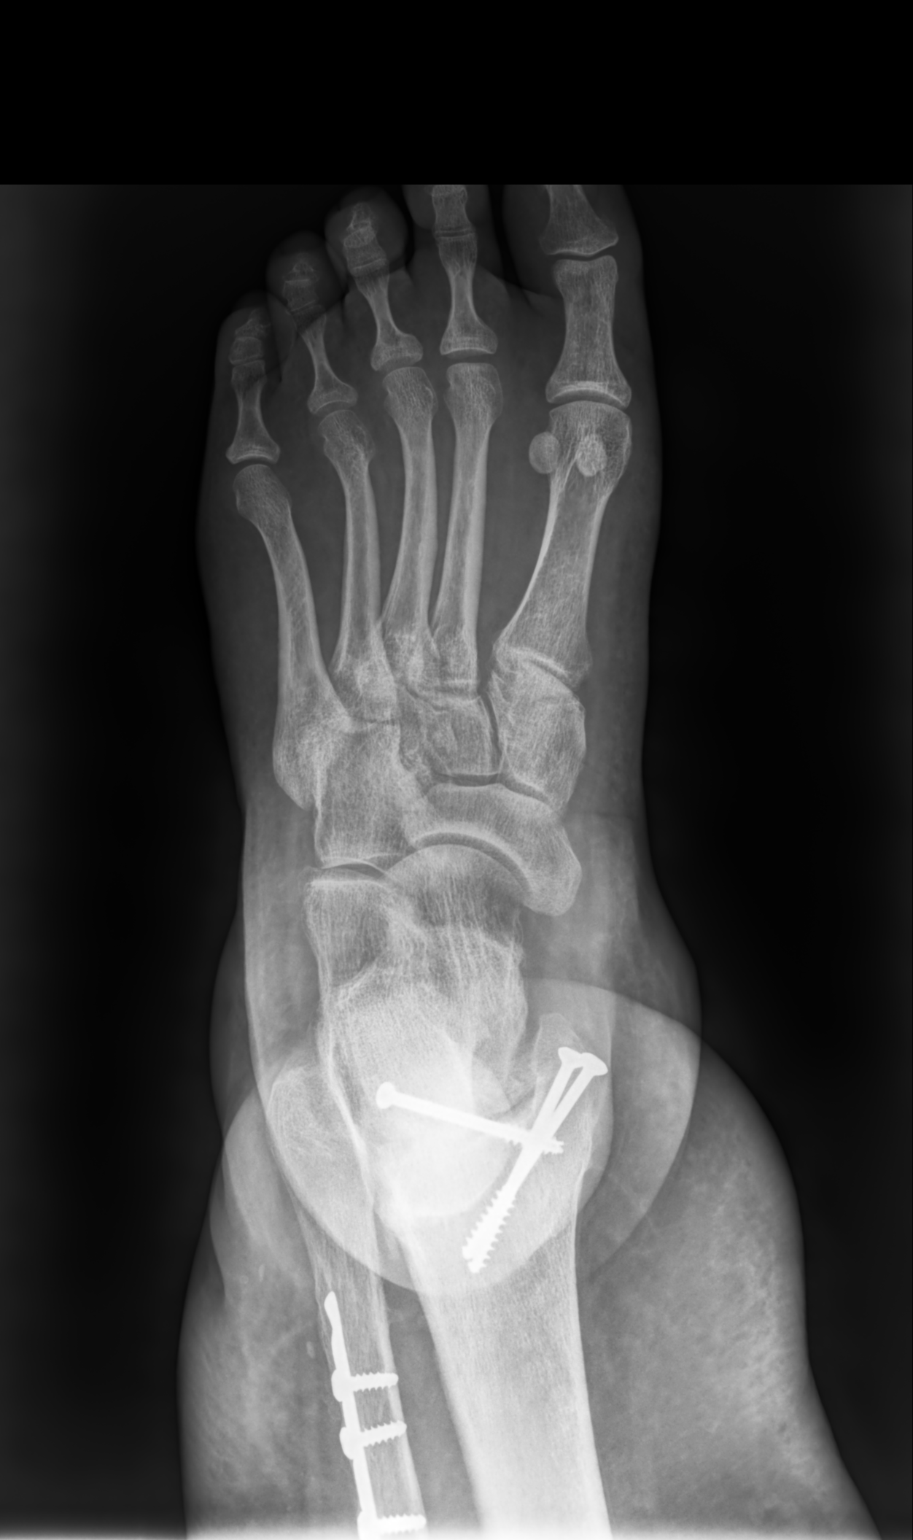

[foot medial oblique]
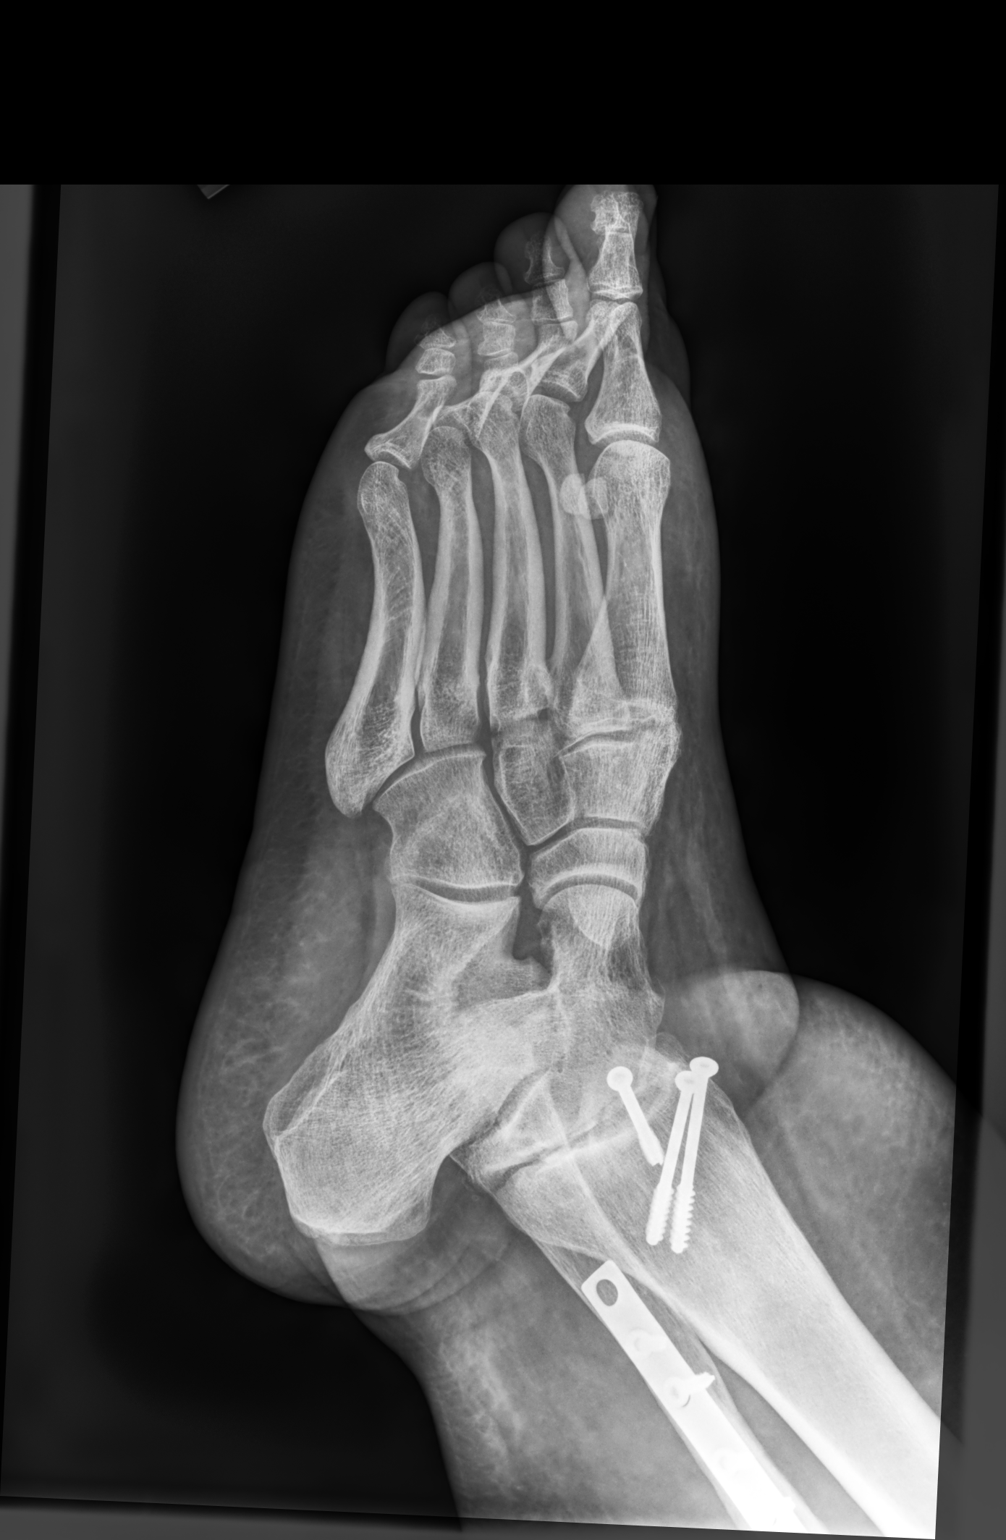

[foot supine lat]
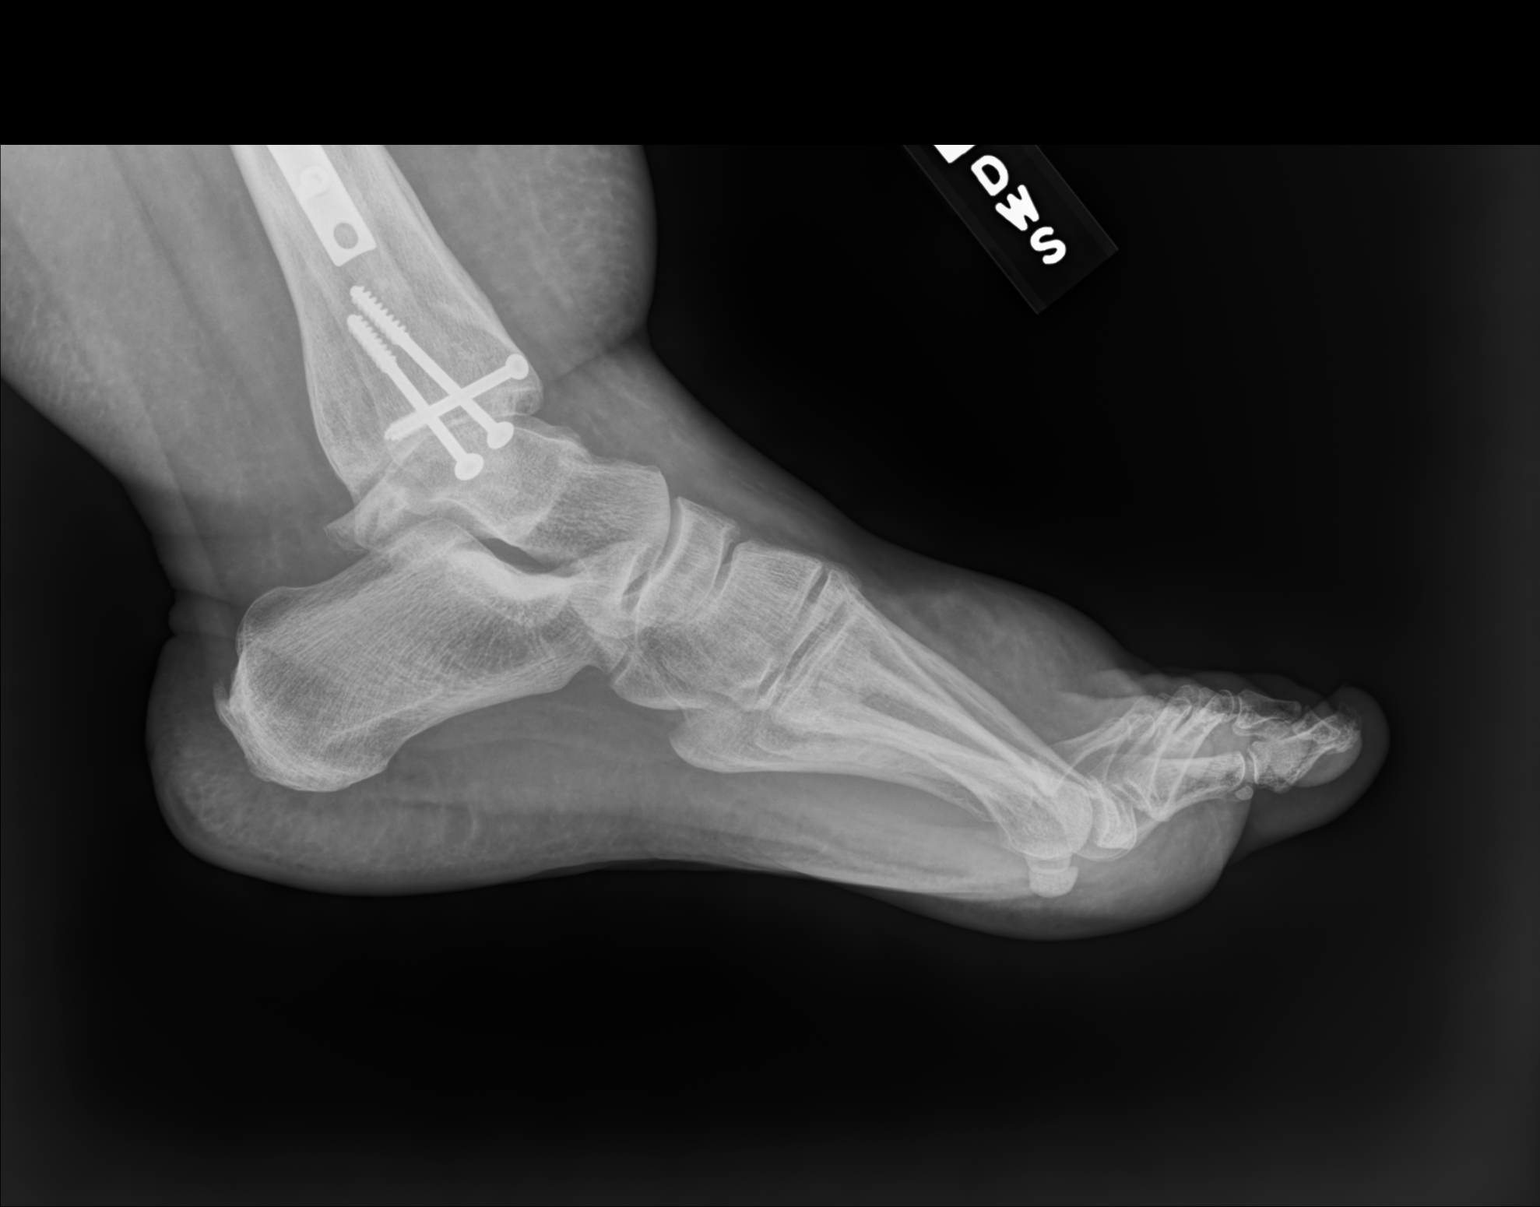

[3 of 3 positions shown; findings below may reference images not displayed]

FINDINGS: There is no evidence of fracture or dislocation. Prior fixation of
the distal tibia and fibula are noted. There is soft tissue swelling
of the midfoot.
IMPRESSION: No acute fracture or dislocation. Soft tissue swelling of the
midfoot.

## 2021-05-23 ENCOUNTER — Encounter: Payer: Self-pay | Admitting: Family Medicine

## 2021-05-23 MED ORDER — TIRZEPATIDE 7.5 MG/0.5ML ~~LOC~~ SOAJ: 7.5000 mg | SUBCUTANEOUS | 0 refills | Status: DC

## 2021-05-23 NOTE — Telephone Encounter (Signed)
Need approval to send in

## 2021-06-07 ENCOUNTER — Encounter: Payer: Self-pay | Admitting: Allergy

## 2021-06-07 ENCOUNTER — Ambulatory Visit: Payer: Medicaid Other | Admitting: Allergy

## 2021-06-08 ENCOUNTER — Telehealth: Payer: Self-pay

## 2021-06-08 NOTE — Telephone Encounter (Signed)
Patient called because her [employer]enger needs an updated Prior Authorization. She had to cancel her office visit appointment on 06/07/21 and sent Dr. Selena Batten a mychart message. Her next office visit is on 07/19/21.

## 2021-06-08 NOTE — Telephone Encounter (Signed)
Noted  

## 2021-06-16 ENCOUNTER — Other Ambulatory Visit: Payer: Self-pay | Admitting: Allergy

## 2021-06-16 ENCOUNTER — Encounter: Payer: Self-pay | Admitting: Allergy

## 2021-06-16 ENCOUNTER — Other Ambulatory Visit: Payer: Self-pay | Admitting: *Deleted

## 2021-06-16 MED ORDER — FASENRA PEN 30 MG/ML ~~LOC~~ SOAJ: 30.0000 mg | SUBCUTANEOUS | 6 refills | Status: DC

## 2021-06-28 ENCOUNTER — Encounter: Payer: Self-pay | Admitting: Family Medicine

## 2021-06-28 ENCOUNTER — Other Ambulatory Visit: Payer: Self-pay | Admitting: *Deleted

## 2021-06-28 ENCOUNTER — Ambulatory Visit: Payer: Medicaid Other | Admitting: Family Medicine

## 2021-06-28 VITALS — BP 126/87 | HR 85 | Temp 98.7°F | Ht 67.0 in | Wt 315.0 lb

## 2021-06-28 DIAGNOSIS — I152 Hypertension secondary to endocrine disorders: Secondary | ICD-10-CM | POA: Diagnosis not present

## 2021-06-28 DIAGNOSIS — F339 Major depressive disorder, recurrent, unspecified: Secondary | ICD-10-CM

## 2021-06-28 DIAGNOSIS — N3000 Acute cystitis without hematuria: Secondary | ICD-10-CM

## 2021-06-28 DIAGNOSIS — F411 Generalized anxiety disorder: Secondary | ICD-10-CM

## 2021-06-28 DIAGNOSIS — N393 Stress incontinence (female) (male): Secondary | ICD-10-CM

## 2021-06-28 DIAGNOSIS — E1159 Type 2 diabetes mellitus with other circulatory complications: Secondary | ICD-10-CM | POA: Diagnosis not present

## 2021-06-28 DIAGNOSIS — R32 Unspecified urinary incontinence: Secondary | ICD-10-CM | POA: Diagnosis not present

## 2021-06-28 LAB — URINALYSIS, ROUTINE W REFLEX MICROSCOPIC
Bilirubin, UA: NEGATIVE
Glucose, UA: NEGATIVE
Ketones, UA: NEGATIVE
Nitrite, UA: POSITIVE — AB
Protein,UA: NEGATIVE
RBC, UA: NEGATIVE
Specific Gravity, UA: 1.02 (ref 1.005–1.030)
Urobilinogen, Ur: 0.2 mg/dL (ref 0.2–1.0)
pH, UA: 6 (ref 5.0–7.5)

## 2021-06-28 LAB — MICROSCOPIC EXAMINATION
RBC, Urine: NONE SEEN /hpf (ref 0–2)
Renal Epithel, UA: NONE SEEN /hpf

## 2021-06-28 MED ORDER — SULFAMETHOXAZOLE-TRIMETHOPRIM 800-160 MG PO TABS: 1.0000 | ORAL_TABLET | Freq: Two times a day (BID) | ORAL | 0 refills | Status: AC

## 2021-06-28 MED ORDER — OXYBUTYNIN CHLORIDE ER 5 MG PO TB24: 5.0000 mg | ORAL_TABLET | Freq: Every day | ORAL | 2 refills | Status: DC

## 2021-06-28 MED ORDER — SULFAMETHOXAZOLE-TRIMETHOPRIM 800-160 MG PO TABS: 1.0000 | ORAL_TABLET | Freq: Two times a day (BID) | ORAL | 0 refills | Status: DC

## 2021-06-28 MED ORDER — FLUOXETINE HCL 40 MG PO CAPS: 40.0000 mg | ORAL_CAPSULE | Freq: Every day | ORAL | 3 refills | Status: DC

## 2021-06-28 MED ORDER — FLUOXETINE HCL 20 MG PO CAPS: 20.0000 mg | ORAL_CAPSULE | Freq: Every day | ORAL | 3 refills | Status: DC

## 2021-06-28 MED ORDER — LISINOPRIL-HYDROCHLOROTHIAZIDE 20-25 MG PO TABS
1.0000 | ORAL_TABLET | Freq: Every day | ORAL | 3 refills | Status: DC
Start: 2021-06-28 — End: 2022-07-21

## 2021-06-28 NOTE — Addendum Note (Signed)
Addended by: Sonny Masters on: 06/28/2021 04:38 PM   Modules accepted: Orders

## 2021-06-28 NOTE — Patient Instructions (Signed)
Ke

## 2021-06-28 NOTE — Progress Notes (Signed)
Subjective:  Patient ID: Cassandra Tucker, female    DOB: 02-08-1991, 31 y.o.   MRN: [id number]  Patient Care Team: Baruch Gouty, FNP as PCP - General (Family Medicine)   Chief Complaint:  Urinary Incontinence   HPI: Cassandra Tucker is a 31 y.o. female presenting on 06/28/2021 for Urinary Incontinence   Pt presents today with complaints or urinary incontinence with sneezing, coughing, and laughing. States has been ongoing for several weeks but has worsened over the last 2 weeks. She denies fever, chills, flank pain, dysuria, abdominal pain, weakness, or confusion. She also reports an increase in her anxiety and depression symptoms. She has increased her fluoxetine to 40 mg daily with some decrease in symptoms. No SI or HI.   Depression screen [facility] 2/9 06/28/2021 05/03/2021 04/21/2021 02/18/2020 10/10/2017  Decreased Interest 3 2 2 3  0  Down, Depressed, Hopeless 3 3 3 3  0  PHQ - 2 Score 6 5 5 6  0  Altered sleeping 3 3 2 3  -  Tired, decreased energy 3 3 3 3  -  Change in appetite 3 3 3 3  -  Feeling bad or failure about yourself  3 2 0 2 -  Trouble concentrating 3 3 3 3  -  Moving slowly or fidgety/restless 3 3 2 3  -  Suicidal thoughts 0 0 0 3 -  PHQ-9 Score 24 22 18 26  -  Difficult doing work/chores Extremely dIfficult Somewhat difficult Somewhat difficult Extremely dIfficult -  Some recent data might be hidden   GAD 7 : Generalized Anxiety Score 06/28/2021 05/03/2021 04/21/2021  Nervous, Anxious, on Edge 3 3 3   Control/stop worrying 3 3 2   Worry too much - different things 3 3 3   Trouble relaxing 3 3 3   Restless 3 3 3   Easily annoyed or irritable 3 3 3   Afraid - awful might happen 3 0 0  Total GAD 7 Score 21 18 17   Anxiety Difficulty Extremely difficult Somewhat difficult -      Relevant past medical, surgical, family, and social history reviewed and updated as indicated.  Allergies and medications reviewed and updated. Data reviewed: Chart in Epic.   Past Medical  History:  Diagnosis Date   Ankle fracture, left 2000   Anxiety    Arthritis    left wrist and left ankle   Asthma    Back pain    Back pain    Bronchitis    Depression    Diabetes ([place]) 01/11/2016   type II  history of   Family history of adverse reaction to anesthesia    gmother had problems with N/V    GERD (gastroesophageal reflux disease)    Headache    High cholesterol    History of kidney stones    HLD (hyperlipidemia)    HSV-2 infection    Hypertension 2013   Intracranial hypertension    psuedotumor cebrum   Joint pain    Joint pain    Kienbock's disease    Leg edema    OSA (obstructive sleep apnea)    cpap - does not know settings    Papilledema    Prediabetes    Pregnancy induced hypertension    Pseudotumor cerebri syndrome    RLS (restless legs syndrome)    Sinus tachycardia    SOB (shortness of breath)     Past Surgical History:  Procedure Laterality Date   CESAREAN SECTION N/A 07/17/2019   Procedure: CESAREAN SECTION;  Surgeon: Everett Graff, MD;  Location: [facility] ORS;  Service: Obstetrics;  Laterality: N/A;   FRACTURE SURGERY  2005   ankle   FRACTURE SURGERY  2013   wrist (deteriorating bone)   LAPAROSCOPIC GASTRIC SLEEVE RESECTION N/A 05/08/2017   Procedure: LAPAROSCOPIC GASTRIC SLEEVE RESECTION;  Surgeon: Clovis Riley, MD;  Location: [facility] ORS;  Service: General;  Laterality: N/A;   LAPAROSCOPIC GASTRIC SLEEVE RESECTION  05/08/2017   UPPER GI ENDOSCOPY  05/08/2017   Procedure: UPPER GI ENDOSCOPY;  Surgeon: Clovis Riley, MD;  Location: [facility] ORS;  Service: General;;   urethra stretch     WRIST SURGERY  2015    Social History   Socioeconomic History   Marital status: Married    Spouse name: Cecilie Kicks   Number of children: 0   Years of education: 12+   Highest education level: Not on file  Occupational History   Occupation: Solicitor: [employer]  Tobacco Use   Smoking status: Former    Packs/day: 1.00    Types: Cigarettes    Quit  date: 10/05/2018    Years since quitting: 2.7   Smokeless tobacco: Never  Vaping Use   Vaping Use: Never used  Substance and Sexual Activity   Alcohol use: Not Currently   Drug use: Not Currently    Types: Marijuana   Sexual activity: Yes    Birth control/protection: I.U.D.    Comment: condoms previously  Other Topics Concern   Not on file  Social History Narrative   Lives with grandparents   Caffeine use: Drinks coffee/tea/soda- 20oz per day (none after starting diamox)   Social Determinants of Health   Financial Resource Strain: Not on file  Food Insecurity: Not on file  Transportation Needs: Not on file  Physical Activity: Not on file  Stress: Not on file  Social Connections: Not on file  Intimate Partner Violence: Not on file    Outpatient Encounter Medications as of 06/28/2021  Medication Sig   albuterol (PROVENTIL) (2.5 MG/3ML) 0.083% nebulizer solution Take 3 mLs (2.5 mg total) by nebulization every 4 (four) hours as needed for wheezing or shortness of breath.   albuterol (VENTOLIN HFA) 108 (90 Base) MCG/ACT inhaler Inhale 2 puffs into the lungs every 4 (four) hours as needed for wheezing or shortness of breath (coughing fits).   Benralizumab (FASENRA PEN) 30 MG/ML SOAJ Inject 1 mL (30 mg total) into the skin every 8 (eight) weeks.   famotidine (PEPCID) 20 MG tablet Take 1 tablet (20 mg total) by mouth 2 (two) times daily.   FASENRA PEN 30 MG/ML SOAJ INJECT 1 PEN UNDER THE SKIN EVERY 8 WEEKS.   levocetirizine (XYZAL) 5 MG tablet Take 1 tablet (5 mg total) by mouth every evening.   levonorgestrel (MIRENA, 52 MG,) 20 MCG/24HR IUD Mirena 20 mcg/24 hours (6 yrs) 52 mg intrauterine device  Take 1 device by intrauterine route.   lisinopril-hydrochlorothiazide (ZESTORETIC) 10-12.5 MG tablet Take 1 tablet by mouth daily.   pravastatin (PRAVACHOL) 20 MG tablet Take 1 tablet (20 mg total) by mouth daily.   tirzepatide (MOUNJARO) 7.5 MG/0.5ML Pen Inject 7.5 mg into the skin once a  week.   VENTOLIN HFA 108 (90 Base) MCG/ACT inhaler Inhale 2 puffs into the lungs every 6 (six) hours as needed for wheezing or shortness of breath.   [DISCONTINUED] FLUoxetine (PROZAC) 20 MG capsule Take 1 capsule (20 mg total) by mouth daily.   [DISCONTINUED] FLUoxetine (PROZAC) 20 MG capsule Take 1 capsule (20 mg total) by  mouth daily.   [DISCONTINUED] FLUoxetine (PROZAC) 40 MG capsule Take 1 capsule (40 mg total) by mouth daily.   [DISCONTINUED] oxybutynin (DITROPAN-XL) 5 MG 24 hr tablet Take 1 tablet (5 mg total) by mouth at bedtime.   [DISCONTINUED] sulfamethoxazole-trimethoprim (BACTRIM DS) 800-160 MG tablet Take 1 tablet by mouth 2 (two) times daily for 7 days.   NIFEdipine (ADALAT CC) 60 MG 24 hr tablet Take 60 mg by mouth every morning.    [DISCONTINUED] cetirizine (ZYRTEC) 10 MG tablet Take 10 mg by mouth daily.   [DISCONTINUED] fluticasone (FLONASE) 50 MCG/ACT nasal spray Place 2 sprays into both nostrils daily.   No facility-administered encounter medications on file as of 06/28/2021.    Allergies  Allergen Reactions   Cats Claw (Uncaria Tomentosa) Itching, Shortness Of Breath and Swelling   Dust Mite Extract Itching and Shortness Of Breath   Grass Pollen(K-O-R-T-Swt Vern) Itching and Shortness Of Breath   Mixed Feathers Itching, Shortness Of Breath and Swelling    Review of Systems  Constitutional:  Positive for activity change, appetite change and fatigue. Negative for chills, diaphoresis, fever and unexpected weight change.  HENT: Negative.    Eyes: Negative.   Respiratory:  Negative for cough, chest tightness and shortness of breath.   Cardiovascular:  Negative for chest pain, palpitations and leg swelling.  Gastrointestinal:  Negative for abdominal pain, blood in stool, constipation, diarrhea, nausea and vomiting.  Endocrine: Negative.   Genitourinary:  Positive for frequency. Negative for decreased urine volume, difficulty urinating, dyspareunia, dysuria, enuresis,  flank pain, genital sores, hematuria, menstrual problem, pelvic pain, urgency, vaginal bleeding, vaginal discharge and vaginal pain.       Stress incontinence  Musculoskeletal:  Negative for arthralgias and myalgias.  Skin: Negative.   Allergic/Immunologic: Negative.   Neurological:  Negative for dizziness, tremors, seizures, syncope, facial asymmetry, speech difficulty, weakness, light-headedness, numbness and headaches.  Hematological: Negative.   Psychiatric/Behavioral:  Positive for agitation, decreased concentration, dysphoric mood and sleep disturbance. Negative for behavioral problems, confusion, hallucinations, self-injury and suicidal ideas. The patient is nervous/anxious. The patient is not hyperactive.   All other systems reviewed and are negative.      Objective:  BP 126/87    Pulse 85    Temp 98.7 F (37.1 C)    Ht 5\' 7"  (1.702 m)    Wt (!) 315 lb (142.9 kg)    SpO2 97%    BMI 49.34 kg/m    Wt Readings from Last 3 Encounters:  06/28/21 (!) 315 lb (142.9 kg)  05/03/21 (!) 337 lb (152.9 kg)  04/21/21 (!) 347 lb (157.4 kg)    Physical Exam Vitals and nursing note reviewed.  Constitutional:      General: She is not in acute distress.    Appearance: Normal appearance. She is well-developed and well-groomed. She is morbidly obese. She is not ill-appearing, toxic-appearing or diaphoretic.  HENT:     Head: Normocephalic and atraumatic.     Jaw: There is normal jaw occlusion.     Right Ear: Hearing normal.     Left Ear: Hearing normal.     Nose: Nose normal.     Mouth/Throat:     Lips: Pink.     Mouth: Mucous membranes are moist.     Pharynx: Oropharynx is clear. Uvula midline.  Eyes:     General: Lids are normal.     Extraocular Movements: Extraocular movements intact.     Conjunctiva/sclera: Conjunctivae normal.     Pupils: Pupils are equal,  round, and reactive to light.  Neck:     Thyroid: No thyroid mass, thyromegaly or thyroid tenderness.     Vascular: No carotid  bruit or JVD.     Trachea: Trachea and phonation normal.  Cardiovascular:     Rate and Rhythm: Normal rate and regular rhythm.     Chest Wall: PMI is not displaced.     Pulses: Normal pulses.     Heart sounds: Normal heart sounds. No murmur heard.   No friction rub. No gallop.  Pulmonary:     Effort: Pulmonary effort is normal. No respiratory distress.     Breath sounds: Normal breath sounds. No wheezing.  Abdominal:     General: Bowel sounds are normal. There is no distension or abdominal bruit.     Palpations: Abdomen is soft. There is no hepatomegaly or splenomegaly.     Tenderness: There is no abdominal tenderness. There is no right CVA tenderness or left CVA tenderness.     Hernia: No hernia is present.  Musculoskeletal:        General: Normal range of motion.     Cervical back: Normal range of motion and neck supple.     Right lower leg: No edema.     Left lower leg: No edema.  Lymphadenopathy:     Cervical: No cervical adenopathy.  Skin:    General: Skin is warm and dry.     Capillary Refill: Capillary refill takes less than 2 seconds.     Coloration: Skin is not cyanotic, jaundiced or pale.     Findings: No rash.  Neurological:     General: No focal deficit present.     Mental Status: She is alert and oriented to person, place, and time.     Sensory: Sensation is intact.     Motor: Motor function is intact.     Coordination: Coordination is intact.     Gait: Gait is intact.     Deep Tendon Reflexes: Reflexes are normal and symmetric.  Psychiatric:        Attention and Perception: Attention and perception normal.        Mood and Affect: Mood and affect normal.        Speech: Speech normal.        Behavior: Behavior normal. Behavior is cooperative.        Thought Content: Thought content normal.        Cognition and Memory: Cognition and memory normal.        Judgment: Judgment normal.    Results for orders placed or performed in visit on 06/28/21  Microscopic  Examination   Urine  Result Value Ref Range   WBC, UA 0-5 0 - 5 /hpf   RBC None seen 0 - 2 /hpf   Epithelial Cells (non renal) 0-10 0 - 10 /hpf   Renal Epithel, UA None seen None seen /hpf   Bacteria, UA Many (A) None seen/Few  Urinalysis, Routine w reflex microscopic  Result Value Ref Range   Specific Gravity, UA 1.020 1.005 - 1.030   pH, UA 6.0 5.0 - 7.5   Color, UA Yellow Yellow   Appearance Ur Cloudy (A) Clear   Leukocytes,UA Trace (A) Negative   Protein,UA Negative Negative/Trace   Glucose, UA Negative Negative   Ketones, UA Negative Negative   RBC, UA Negative Negative   Bilirubin, UA Negative Negative   Urobilinogen, Ur 0.2 0.2 - 1.0 mg/dL   Nitrite, UA Positive (A) Negative  Microscopic Examination See below:        Pertinent labs & imaging results that were available during my care of the patient were reviewed by me and considered in my medical decision making.  Assessment & Plan:  Cassandra Tucker was seen today for urinary incontinence.  Diagnoses and all orders for this visit:  Stress incontinence in female Urinalysis as noted. Will treat UTI. If symptoms persist post treatment of UTI, pt aware to start Ditropan as prescribed. Avoid bladder irritants. Kegel exercises discussed in detail. Report any new, worsening, or persistent symptoms.  -     Urinalysis, Routine w reflex microscopic -     Urine Culture -     Oxybutynin (DITROPAN-XL) 5 MG 24 hr tablet; Take 1 tablet (5 mg total) by mouth at bedtime. -     Microscopic Examination  Acute cystitis without hematuria Urinalysis as noted, culture pending, will change therapy if warranted. Medications as prescribed. Symptomatic care discussed in detail. No indications of acute pyelonephritis. Report any new, worsening, or persistent symptoms.  -     Urinalysis, Routine w reflex microscopic -     Urine Culture -     Sulfamethoxazole-trimethoprim (BACTRIM DS) 800-160 MG tablet; Take 1 tablet by mouth 2 (two) times daily for 7  days. -     Microscopic Examination  GAD (generalized anxiety disorder) Depression, recurrent (HCC) Has been taking 40 mg fluoxetine with some relief of symptoms. Will increase to 60 mg today. Follow up in 6 weeks for reevaluation or sooner if warranted. No SI or HI -     FLUoxetine (PROZAC) 40 MG capsule; Take 1 capsule (40 mg total) by mouth daily. -     FLUoxetine (PROZAC) 20 MG capsule; Take 1 capsule (20 mg total) by mouth daily.     Continue all other maintenance medications.  Follow up plan: Return in about 6 weeks (around 08/09/2021), or if symptoms worsen or fail to improve, for Stress Incontinence, GAD, depression .   Continue healthy lifestyle choices, including diet (rich in fruits, vegetables, and lean proteins, and low in salt and simple carbohydrates) and exercise (at least 30 minutes of moderate physical activity daily).  Educational handout given for Kegel exercises  The above assessment and management plan was discussed with the patient. The patient verbalized understanding of and has agreed to the management plan. Patient is aware to call the clinic if they develop any new symptoms or if symptoms persist or worsen. Patient is aware when to return to the clinic for a follow-up visit. Patient educated on when it is appropriate to go to the emergency department.   Monia Pouch, FNP-C [facility] [phone]

## 2021-06-30 LAB — URINE CULTURE

## 2021-07-18 NOTE — Progress Notes (Signed)
Follow Up Note  RE: Cassandra Tucker MRN: [id number] DOB: [birth year] Date of Office Visit: 07/19/2021  Referring provider: Sonny Masters, FNP Primary care provider: Sonny Masters, FNP  Chief Complaint: Asthma (No issues ), Allergic Rhinitis  (No issues ), Allergy Testing (Would like to do allergy testing another day - du to having her child with her ), and Eczema (Broke out on her legs for a couple of days - not sure the cause was diagnosed with eczema )  History of Present Illness: I had the pleasure of seeing Cassandra Tucker for a follow up visit at the [facility] on 07/19/2021. She is a 31 y.o. female, who is being followed for asthma on Fasenra and allergic rhinoconjunctivitis. Her previous allergy office visit was on 08/24/2020 with Dr. Selena Batten. Today is a new complaint visit of food allergies .  Severe persistent asthma Currently on Fasenra injections every 8 weeks at home with no issues. One time missed her dose by 2 weeks and noticed increased wheezing.  Not taking Singulair anymore and not sure when this was stopped.   Using albuterol 2-3 times per week due to dyspnea on exertion/wheezing with good benefit.  Denies any ER/urgent care visits or prednisone use since the last visit.  Seasonal and perennial allergic rhinoconjunctivitis Ran out of Xyzal and asymptomatic with no daily meds. Sill lives 45 minutes away so can't do AIT.   Broke out in rash for about 1 week on her legs - trigger was heat. Daughter had similar rash. Denies any changes in diet, meds or personal care products.   Assessment and Plan: Cassandra Tucker is a 31 y.o. female with: Severe persistent asthma without complication Past history - Symptoms of chest tightness, shortness of breath, coughing, wheezing, nocturnal awakenings for 1 year. History of reflux but no longer on PPI. Ex-smoker. 2021 spirometry showed: normal pattern and 13% improvement in FEV1 post bronchodilator treatment. Clinically  feeling better. Interim history - Well-controlled with Fasenra injections. Noticed increased symptoms when late by 2 weeks.  Today's spirometry was normal - improved from previous one.  Continue Fasenra injections at home every 8 weeks.  Daily controller medication(s): Restart Singulair (montelukast) 10mg  daily at night. During upper respiratory infections/asthma flares:  Start Symbicort 2 puffs twice a day with spacer and rinse mouth afterwards for 1-2 weeks until your breathing symptoms return to baseline.  Pretreat with albuterol 2 puffs or albuterol nebulizer.  If you need to use your albuterol nebulizer machine back to back within 15-30 minutes with no relief then please go to the ER/urgent care for further evaluation.  May use albuterol rescue inhaler 2 puffs every 4 to 6 hours as needed for shortness of breath, chest tightness, coughing, and wheezing. May use albuterol rescue inhaler 2 puffs 5 to 15 minutes prior to strenuous physical activities. Monitor frequency of use.  Get spirometry at next visit.  Seasonal and perennial allergic rhinoconjunctivitis Past history - Perennial rhino conjunctivitis symptoms for 20+ years with worsening in the spring. No previous ENT evaluation. Tried Claritin, Flonase and Zyrtec with some benefit. 2021 skin testing showed: Positive to grass, dust mites, cats, feathers. Negative to common foods.  Interim history - asymptomatic with no daily meds.  Continue environmental control measures as below. Start Singulair (montelukast) 10mg  daily at night.  May use over the counter antihistamines such as Zyrtec (cetirizine), Claritin (loratadine), Allegra (fexofenadine), or Xyzal (levocetirizine) daily. May take it twice a day if needed.  May  use Flonase (fluticasone) nasal spray 1 spray per nostril twice a day as needed for nasal congestion.   Rash and other nonspecific skin eruption Rash on leg x 1 week. No trigger noted. Resolved. Monitor symptoms and  take pictures.  Return in about 4 months (around 11/16/2021).  Meds ordered this encounter  Medications   levocetirizine (XYZAL) 5 MG tablet    Sig: Take 1 tablet (5 mg total) by mouth every evening.    Dispense:  30 tablet    Refill:  5   fluticasone (FLONASE) 50 MCG/ACT nasal spray    Sig: Place 1 spray into both nostrils 2 (two) times daily as needed (nasal congestion).    Dispense:  16 g    Refill:  5   montelukast (SINGULAIR) 10 MG tablet    Sig: Take 1 tablet (10 mg total) by mouth at bedtime.    Dispense:  30 tablet    Refill:  5   albuterol (VENTOLIN HFA) 108 (90 Base) MCG/ACT inhaler    Sig: Inhale 2 puffs into the lungs every 4 (four) hours as needed for wheezing or shortness of breath (coughing fits).    Dispense:  18 g    Refill:  1   Lab Orders  No laboratory test(s) ordered today    Diagnostics: Spirometry:  Tracings reviewed. Her effort: Good reproducible efforts. FVC: 3.62L FEV1: 2.98L, 95% predicted FEV1/FVC ratio: 82% Interpretation: Spirometry consistent with normal pattern.  Please see scanned spirometry results for details.  Medication List:  Current Outpatient Medications  Medication Sig Dispense Refill   albuterol (PROVENTIL) (2.5 MG/3ML) 0.083% nebulizer solution Take 3 mLs (2.5 mg total) by nebulization every 4 (four) hours as needed for wheezing or shortness of breath. 75 mL 1   albuterol (VENTOLIN HFA) 108 (90 Base) MCG/ACT inhaler Inhale 2 puffs into the lungs every 4 (four) hours as needed for wheezing or shortness of breath (coughing fits). 18 g 1   Benralizumab (FASENRA PEN) 30 MG/ML SOAJ Inject 1 mL (30 mg total) into the skin every 8 (eight) weeks. 1 mL 6   famotidine (PEPCID) 20 MG tablet Take 1 tablet (20 mg total) by mouth 2 (two) times daily. 180 tablet 3   FASENRA PEN 30 MG/ML SOAJ INJECT 1 PEN UNDER THE SKIN EVERY 8 WEEKS. 1 mL 6   FLUoxetine (PROZAC) 20 MG capsule Take 1 capsule (20 mg total) by mouth daily. 30 capsule 3   FLUoxetine  (PROZAC) 40 MG capsule Take 1 capsule (40 mg total) by mouth daily. 30 capsule 3   fluticasone (FLONASE) 50 MCG/ACT nasal spray Place 1 spray into both nostrils 2 (two) times daily as needed (nasal congestion). 16 g 5   levonorgestrel (MIRENA, 52 MG,) 20 MCG/24HR IUD Mirena 20 mcg/24 hours (6 yrs) 52 mg intrauterine device  Take 1 device by intrauterine route.     lisinopril-hydrochlorothiazide (ZESTORETIC) 20-25 MG tablet Take 1 tablet by mouth daily. 90 tablet 3   montelukast (SINGULAIR) 10 MG tablet Take 1 tablet (10 mg total) by mouth at bedtime. 30 tablet 5   oxybutynin (DITROPAN-XL) 5 MG 24 hr tablet Take 1 tablet (5 mg total) by mouth at bedtime. 30 tablet 2   pravastatin (PRAVACHOL) 20 MG tablet Take 1 tablet (20 mg total) by mouth daily. 90 tablet 3   tirzepatide (MOUNJARO) 7.5 MG/0.5ML Pen Inject 7.5 mg into the skin once a week. 6 mL 0   levocetirizine (XYZAL) 5 MG tablet Take 1 tablet (5 mg total) by  mouth every evening. 30 tablet 5   NIFEdipine (ADALAT CC) 60 MG 24 hr tablet Take 60 mg by mouth every morning.      No current facility-administered medications for this visit.   Allergies: Allergies  Allergen Reactions   Cats Claw (Uncaria Tomentosa) Itching, Shortness Of Breath and Swelling   Dust Mite Extract Itching and Shortness Of Breath   Grass Pollen(K-O-R-T-Swt Vern) Itching and Shortness Of Breath   Mixed Feathers Itching, Shortness Of Breath and Swelling   I reviewed her past medical history, social history, family history, and environmental history and no significant changes have been reported from her previous visit.  Review of Systems  Constitutional:  Negative for appetite change, chills, fever and unexpected weight change.  HENT:  Negative for congestion, postnasal drip, rhinorrhea and sneezing.   Eyes:  Negative for itching.  Respiratory:  Negative for cough, chest tightness, shortness of breath and wheezing.   Cardiovascular:  Negative for chest pain.   Gastrointestinal:  Negative for abdominal pain.  Genitourinary:  Negative for difficulty urinating.  Skin:  Negative for rash.  Allergic/Immunologic: Positive for environmental allergies. Negative for food allergies.  Neurological:  Negative for headaches.   Objective: BP 120/82    Pulse 82    Temp 98.1 F (36.7 C)    Resp 18    Ht 5\' 8"  (1.727 m)    Wt (!) 315 lb (142.9 kg)    SpO2 99%    BMI 47.90 kg/m  Body mass index is 47.9 kg/m. Physical Exam Vitals and nursing note reviewed.  Constitutional:      Appearance: Normal appearance. She is well-developed. She is obese.  HENT:     Head: Normocephalic and atraumatic.     Right Ear: Tympanic membrane and external ear normal.     Left Ear: Tympanic membrane and external ear normal.     Nose: Nose normal.     Mouth/Throat:     Mouth: Mucous membranes are moist.     Pharynx: Oropharynx is clear.  Eyes:     Conjunctiva/sclera: Conjunctivae normal.  Cardiovascular:     Rate and Rhythm: Normal rate and regular rhythm.     Heart sounds: Normal heart sounds. No murmur heard.   No friction rub. No gallop.  Pulmonary:     Effort: Pulmonary effort is normal.     Breath sounds: No wheezing, rhonchi or rales.  Musculoskeletal:     Cervical back: Neck supple.  Skin:    General: Skin is warm.     Findings: No rash.  Neurological:     Mental Status: She is alert and oriented to person, place, and time.  Psychiatric:        Behavior: Behavior normal.   Previous notes and tests were reviewed. The plan was reviewed with the patient/family, and all questions/concerned were addressed.  It was my pleasure to see Cassandra Tucker today and participate in her care. Please feel free to contact me with any questions or concerns.  Sincerely,  , DO Allergy & Immunology  [facility] office: [phone] [facility] office: [phone]

## 2021-07-19 ENCOUNTER — Ambulatory Visit (INDEPENDENT_AMBULATORY_CARE_PROVIDER_SITE_OTHER): Payer: Medicaid Other | Admitting: Allergy

## 2021-07-19 ENCOUNTER — Encounter: Payer: Self-pay | Admitting: Allergy

## 2021-07-19 ENCOUNTER — Other Ambulatory Visit: Payer: Self-pay

## 2021-07-19 VITALS — BP 120/82 | HR 82 | Temp 98.1°F | Resp 18 | Ht 68.0 in | Wt 315.0 lb

## 2021-07-19 DIAGNOSIS — J302 Other seasonal allergic rhinitis: Secondary | ICD-10-CM

## 2021-07-19 DIAGNOSIS — J3089 Other allergic rhinitis: Secondary | ICD-10-CM

## 2021-07-19 DIAGNOSIS — R21 Rash and other nonspecific skin eruption: Secondary | ICD-10-CM | POA: Diagnosis not present

## 2021-07-19 DIAGNOSIS — H1013 Acute atopic conjunctivitis, bilateral: Secondary | ICD-10-CM | POA: Diagnosis not present

## 2021-07-19 DIAGNOSIS — H1010 Acute atopic conjunctivitis, unspecified eye: Secondary | ICD-10-CM

## 2021-07-19 DIAGNOSIS — J4550 Severe persistent asthma, uncomplicated: Secondary | ICD-10-CM | POA: Diagnosis not present

## 2021-07-19 MED ORDER — LEVOCETIRIZINE DIHYDROCHLORIDE 5 MG PO TABS: 5.0000 mg | ORAL_TABLET | Freq: Every evening | ORAL | 5 refills | Status: DC

## 2021-07-19 MED ORDER — ALBUTEROL SULFATE HFA 108 (90 BASE) MCG/ACT IN AERS: 2.0000 | INHALATION_SPRAY | RESPIRATORY_TRACT | 1 refills | Status: DC | PRN

## 2021-07-19 MED ORDER — MONTELUKAST SODIUM 10 MG PO TABS: 10.0000 mg | ORAL_TABLET | Freq: Every day | ORAL | 5 refills | Status: DC

## 2021-07-19 MED ORDER — FLUTICASONE PROPIONATE 50 MCG/ACT NA SUSP: 1.0000 | Freq: Two times a day (BID) | NASAL | 5 refills | Status: DC | PRN

## 2021-07-19 NOTE — Assessment & Plan Note (Signed)
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 No previous ENT evaluation. Tried Claritin, Flonase and Zyrtec with some benefit. 2021 skin testing showed: Positive to grass, dust mites, cats, feathers. Negative to common foods.  Interim history - asymptomatic with no daily meds.   Continue environmental control measures as below.  Start Singulair (montelukast) 10mg  daily at night.   May use over the counter antihistamines such as Zyrtec (cetirizine), Claritin (loratadine), Allegra (fexofenadine), or Xyzal (levocetirizine) daily. May take it twice a day if needed.   May use Flonase (fluticasone) nasal spray 1 spray per nostril twice a day as needed for nasal congestion.

## 2021-07-19 NOTE — Assessment & Plan Note (Signed)
Past history - Symptoms of chest tightness, shortness of breath, coughing, wheezing, nocturnal awakenings for 1 year. History of reflux but no longer on PPI. Ex-smoker. 2021 spirometry showed: normal pattern and 13% improvement in FEV1 post bronchodilator treatment. Clinically feeling better. Interim history - Well-controlled with Fasenra injections. Noticed increased symptoms when late by 2 weeks.   Today's spirometry was normal - improved from previous one.   Continue Fasenra injections at home every 8 weeks.   Daily controller medication(s): Restart Singulair (montelukast) 10mg  daily at night.  During upper respiratory infections/asthma flares:  o Start Symbicort 2 puffs twice a day with spacer and rinse mouth afterwards for 1-2 weeks until your breathing symptoms return to baseline.  o Pretreat with albuterol 2 puffs or albuterol nebulizer.  o If you need to use your albuterol nebulizer machine back to back within 15-30 minutes with no relief then please go to the ER/urgent care for further evaluation.   May use albuterol rescue inhaler 2 puffs every 4 to 6 hours as needed for shortness of breath, chest tightness, coughing, and wheezing. May use albuterol rescue inhaler 2 puffs 5 to 15 minutes prior to strenuous physical activities. Monitor frequency of use.   Get spirometry at next visit.

## 2021-07-19 NOTE — Assessment & Plan Note (Signed)
Rash on leg x 1 week. No trigger noted. Resolved.  Monitor symptoms and take pictures.

## 2021-07-19 NOTE — Patient Instructions (Addendum)
Environmental allergies Past skin testing showed: Positive to grass, dust mites, cats, feathers. Continue environmental control measures as below. Start Singulair (montelukast) 10mg  daily at night.  May use over the counter antihistamines such as Zyrtec (cetirizine), Claritin (loratadine), Allegra (fexofenadine), or Xyzal (levocetirizine) daily. May take it twice a day if needed.  May use Flonase (fluticasone) nasal spray 1 spray per nostril twice a day as needed for nasal congestion.   Asthma: Continue Fasenra injections at home every 8 weeks.  Daily controller medication(s):  Restart Singulair (montelukast) 10mg  daily at night. During upper respiratory infections/asthma flares:  Start Symbicort 2 puffs twice a day with spacer and rinse mouth afterwards for 1-2 weeks until your breathing symptoms return to baseline.  Pretreat with albuterol 2 puffs or albuterol nebulizer.  If you need to use your albuterol nebulizer machine back to back within 15-30 minutes with no relief then please go to the ER/urgent care for further evaluation.  May use albuterol rescue inhaler 2 puffs every 4 to 6 hours as needed for shortness of breath, chest tightness, coughing, and wheezing. May use albuterol rescue inhaler 2 puffs 5 to 15 minutes prior to strenuous physical activities. Monitor frequency of use.  Asthma control goals:  Full participation in all desired activities (may need albuterol before activity) Albuterol use two times or less a week on average (not counting use with activity) Cough interfering with sleep two times or less a month Oral steroids no more than once a year No hospitalizations   Rash Monitor symptoms and take pictures.  Follow up in 4 months or sooner if needed.  Reducing Pollen Exposure Pollen seasons: trees (spring), grass (summer) and ragweed/weeds (fall). Keep windows closed in your home and car to lower pollen exposure.  Install air conditioning in the bedroom and  throughout the house if possible.  Avoid going out in dry windy days - especially early morning. Pollen counts are highest between 5 - 10 AM and on dry, hot and windy days.  Save outside activities for late afternoon or after a heavy rain, when pollen levels are lower.  Avoid mowing of grass if you have grass pollen allergy. Be aware that pollen can also be transported indoors on people and pets.  Dry your clothes in an automatic dryer rather than hanging them outside where they might collect pollen.  Rinse hair and eyes before bedtime. Control of House Dust Mite Allergen Dust mite allergens are a common trigger of allergy and asthma symptoms. While they can be found throughout the house, these microscopic creatures thrive in warm, humid environments such as bedding, upholstered furniture and carpeting. Because so much time is spent in the bedroom, it is essential to reduce mite levels there.  Encase pillows, mattresses, and box springs in special allergen-proof fabric covers or airtight, zippered plastic covers.  Bedding should be washed weekly in hot water (130 F) and dried in a hot dryer. Allergen-proof covers are available for comforters and pillows that cant be regularly washed.  Wash the allergy-proof covers every few months. Minimize clutter in the bedroom. Keep pets out of the bedroom.  Keep humidity less than 50% by using a dehumidifier or air conditioning. You can buy a humidity measuring device called a hygrometer to monitor this.  If possible, replace carpets with hardwood, linoleum, or washable area rugs. If that's not possible, vacuum frequently with a vacuum that has a HEPA filter. Remove all upholstered furniture and non-washable window drapes from the bedroom. Remove all non-washable stuffed  toys from the bedroom.  Wash stuffed toys weekly. Pet Allergen Avoidance: Contrary to popular opinion, there are no hypoallergenic breeds of dogs or cats. That is because people are not  allergic to an animals hair, but to an allergen found in the animal's saliva, dander (dead skin flakes) or urine. Pet allergy symptoms typically occur within minutes. For some people, symptoms can build up and become most severe 8 to 12 hours after contact with the animal. People with severe allergies can experience reactions in public places if dander has been transported on the pet owners clothing. Keeping an animal outdoors is only a partial solution, since homes with pets in the yard still have higher concentrations of animal allergens. Before getting a pet, ask your allergist to determine if you are allergic to animals. If your pet is already considered part of your family, try to minimize contact and keep the pet out of the bedroom and other rooms where you spend a great deal of time. As with dust mites, vacuum carpets often or replace carpet with a hardwood floor, tile or linoleum. High-efficiency particulate air (HEPA) cleaners can reduce allergen levels over time. While dander and saliva are the source of cat and dog allergens, urine is the source of allergens from rabbits, hamsters, mice and [place] pigs; so ask a non-allergic family member to clean the animals cage. If you have a pet allergy, talk to your allergist about the potential for allergy immunotherapy (allergy shots). This strategy can often provide long-term relief.

## 2021-07-20 ENCOUNTER — Encounter: Payer: Self-pay | Admitting: Family Medicine

## 2021-07-20 ENCOUNTER — Other Ambulatory Visit: Payer: Self-pay | Admitting: Family Medicine

## 2021-07-20 DIAGNOSIS — F411 Generalized anxiety disorder: Secondary | ICD-10-CM

## 2021-07-20 DIAGNOSIS — F339 Major depressive disorder, recurrent, unspecified: Secondary | ICD-10-CM

## 2021-08-02 ENCOUNTER — Encounter: Payer: Self-pay | Admitting: Family Medicine

## 2021-08-02 ENCOUNTER — Ambulatory Visit (INDEPENDENT_AMBULATORY_CARE_PROVIDER_SITE_OTHER): Payer: Medicaid Other | Admitting: Family Medicine

## 2021-08-02 VITALS — BP 136/88 | HR 94 | Temp 98.1°F | Ht 68.0 in | Wt 314.0 lb

## 2021-08-02 DIAGNOSIS — E1159 Type 2 diabetes mellitus with other circulatory complications: Secondary | ICD-10-CM

## 2021-08-02 DIAGNOSIS — R300 Dysuria: Secondary | ICD-10-CM | POA: Diagnosis not present

## 2021-08-02 DIAGNOSIS — Z23 Encounter for immunization: Secondary | ICD-10-CM

## 2021-08-02 DIAGNOSIS — B9620 Unspecified Escherichia coli [E. coli] as the cause of diseases classified elsewhere: Secondary | ICD-10-CM | POA: Diagnosis not present

## 2021-08-02 DIAGNOSIS — E1169 Type 2 diabetes mellitus with other specified complication: Secondary | ICD-10-CM

## 2021-08-02 DIAGNOSIS — E785 Hyperlipidemia, unspecified: Secondary | ICD-10-CM | POA: Diagnosis not present

## 2021-08-02 DIAGNOSIS — E119 Type 2 diabetes mellitus without complications: Secondary | ICD-10-CM | POA: Diagnosis not present

## 2021-08-02 DIAGNOSIS — N390 Urinary tract infection, site not specified: Secondary | ICD-10-CM

## 2021-08-02 DIAGNOSIS — I152 Hypertension secondary to endocrine disorders: Secondary | ICD-10-CM

## 2021-08-02 LAB — LIPID PANEL
Chol/HDL Ratio: 5.7 ratio — ABNORMAL HIGH (ref 0.0–4.4)
Cholesterol, Total: 195 mg/dL (ref 100–199)
HDL: 34 mg/dL — ABNORMAL LOW (ref >39)
LDL Chol Calc (NIH): 131 mg/dL — ABNORMAL HIGH (ref 0–99)
Triglycerides: 168 mg/dL — ABNORMAL HIGH (ref 0–149)
VLDL Cholesterol Cal: 30 mg/dL (ref 5–40)

## 2021-08-02 LAB — URINALYSIS, ROUTINE W REFLEX MICROSCOPIC
Bilirubin, UA: NEGATIVE
Glucose, UA: NEGATIVE
Ketones, UA: NEGATIVE
Nitrite, UA: NEGATIVE
Protein,UA: NEGATIVE
Specific Gravity, UA: 1.02 (ref 1.005–1.030)
Urobilinogen, Ur: 0.2 mg/dL (ref 0.2–1.0)
pH, UA: 6.5 (ref 5.0–7.5)

## 2021-08-02 LAB — MICROSCOPIC EXAMINATION
Epithelial Cells (non renal): >10 /hpf — AB (ref 0–10)
Renal Epithel, UA: NONE SEEN /hpf
WBC, UA: >30 /hpf — AB (ref 0–5)

## 2021-08-02 LAB — CMP14+EGFR
ALT: 10 IU/L (ref 0–32)
AST: 8 IU/L (ref 0–40)
Albumin/Globulin Ratio: 2 (ref 1.2–2.2)
Albumin: 4.4 g/dL (ref 3.9–5.0)
Alkaline Phosphatase: 71 IU/L (ref 44–121)
BUN/Creatinine Ratio: 18 (ref 9–23)
BUN: 11 mg/dL (ref 6–20)
Bilirubin Total: 0.6 mg/dL (ref 0.0–1.2)
CO2: 24 mmol/L (ref 20–29)
Calcium: 9.1 mg/dL (ref 8.7–10.2)
Chloride: 103 mmol/L (ref 96–106)
Creatinine, Ser: 0.62 mg/dL (ref 0.57–1.00)
Globulin, Total: 2.2 g/dL (ref 1.5–4.5)
Glucose: 84 mg/dL (ref 70–99)
Potassium: 4.6 mmol/L (ref 3.5–5.2)
Sodium: 140 mmol/L (ref 134–144)
Total Protein: 6.6 g/dL (ref 6.0–8.5)
eGFR: 124 mL/min/{1.73_m2} (ref >59)

## 2021-08-02 LAB — CBC WITH DIFFERENTIAL/PLATELET
Basophils Absolute: 0 10*3/uL (ref 0.0–0.2)
Basos: 0 %
EOS (ABSOLUTE): 0.2 10*3/uL (ref 0.0–0.4)
Eos: 2 %
Hematocrit: 39.8 % (ref 34.0–46.6)
Hemoglobin: 12.8 g/dL (ref 11.1–15.9)
Immature Grans (Abs): 0 10*3/uL (ref 0.0–0.1)
Immature Granulocytes: 1 %
Lymphocytes Absolute: 2.7 10*3/uL (ref 0.7–3.1)
Lymphs: 34 %
MCH: 25.4 pg — ABNORMAL LOW (ref 26.6–33.0)
MCHC: 32.2 g/dL (ref 31.5–35.7)
MCV: 79 fL (ref 79–97)
Monocytes Absolute: 0.4 10*3/uL (ref 0.1–0.9)
Monocytes: 5 %
Neutrophils Absolute: 4.6 10*3/uL (ref 1.4–7.0)
Neutrophils: 58 %
Platelets: 254 10*3/uL (ref 150–450)
RBC: 5.03 x10E6/uL (ref 3.77–5.28)
RDW: 15.1 % (ref 11.7–15.4)
WBC: 7.9 10*3/uL (ref 3.4–10.8)

## 2021-08-02 LAB — BAYER DCA HB A1C WAIVED: HB A1C (BAYER DCA - WAIVED): 5 % (ref 4.8–5.6)

## 2021-08-02 MED ORDER — TIRZEPATIDE 7.5 MG/0.5ML ~~LOC~~ SOAJ: 7.5000 mg | SUBCUTANEOUS | 3 refills | Status: DC

## 2021-08-02 NOTE — Addendum Note (Signed)
Addended byDory Peru on: 08/02/2021 11:31 AM   Modules accepted: Orders

## 2021-08-02 NOTE — Progress Notes (Signed)
Subjective:  Patient ID: Cassandra Tucker, female    DOB: 1991-01-09, 31 y.o.   MRN: [id number]  Patient Care Team: Baruch Gouty, FNP as PCP - General (Family Medicine)   Chief Complaint:  Medical Management of Chronic Issues   HPI: Cassandra Tucker is a 31 y.o. female presenting on 08/02/2021 for Medical Management of Chronic Issues   1. Controlled type 2 diabetes mellitus without complication, without long-term current use of insulin ([place]) Has been taking Mounjaro without associated side effects. Denies polyuria, polyphagia, or polydipsia. Eye exam is scheduled for next month. She does not exercise on a regular basis but does try to watch her diet.   2. Hyperlipidemia associated with type 2 diabetes mellitus ([place]) Not on statin therapy. Will recheck fasting labs and discuss treatment options once labs result. Has been watching diet but is not exercising.   3. Hypertension associated with type 2 diabetes mellitus ([place]) Reports good control at home. No chest pain, palpitations, leg swelling, headaches, visual changes, or weakness. Compliant with medications without associated side effects.   4. Dysuria Recently treated for UTI. States she has not increased her water intake and does have some continued dysuria at times, notes this occurs when she is drinking dark sodas.      Relevant past medical, surgical, family, and social history reviewed and updated as indicated.  Allergies and medications reviewed and updated. Data reviewed: Chart in Epic.   Past Medical History:  Diagnosis Date   Ankle fracture, left 2000   Anxiety    Arthritis    left wrist and left ankle   Asthma    Back pain    Back pain    Bronchitis    Depression    Diabetes ([place]) 01/11/2016   type II  history of   Family history of adverse reaction to anesthesia    gmother had problems with N/V    GERD (gastroesophageal reflux disease)    Headache    High cholesterol    History of kidney stones     HLD (hyperlipidemia)    HSV-2 infection    Hypertension 2013   Intracranial hypertension    psuedotumor cebrum   Joint pain    Joint pain    Kienbock's disease    Leg edema    OSA (obstructive sleep apnea)    cpap - does not know settings    Papilledema    Prediabetes    Pregnancy induced hypertension    Pseudotumor cerebri syndrome    RLS (restless legs syndrome)    Sinus tachycardia    SOB (shortness of breath)     Past Surgical History:  Procedure Laterality Date   CESAREAN SECTION N/A 07/17/2019   Procedure: CESAREAN SECTION;  Surgeon: Everett Graff, MD;  Location: [facility] LD ORS;  Service: Obstetrics;  Laterality: N/A;   FRACTURE SURGERY  2005   ankle   FRACTURE SURGERY  2013   wrist (deteriorating bone)   LAPAROSCOPIC GASTRIC SLEEVE RESECTION N/A 05/08/2017   Procedure: LAPAROSCOPIC GASTRIC SLEEVE RESECTION;  Surgeon: Clovis Riley, MD;  Location: [facility] ORS;  Service: General;  Laterality: N/A;   LAPAROSCOPIC GASTRIC SLEEVE RESECTION  05/08/2017   UPPER GI ENDOSCOPY  05/08/2017   Procedure: UPPER GI ENDOSCOPY;  Surgeon: Clovis Riley, MD;  Location: [facility] ORS;  Service: General;;   urethra stretch     WRIST SURGERY  2015    Social History   Socioeconomic History   Marital status: Married  Spouse name: Cassandra Tucker   Number of children: 0   Years of education: 12+   Highest education level: Not on file  Occupational History   Occupation: dispatcher    Employer: [employer]  Tobacco Use   Smoking status: Every Day    Packs/day: 1.00    Types: Cigarettes    Last attempt to quit: 10/05/2018    Years since quitting: 2.8   Smokeless tobacco: Never  Vaping Use   Vaping Use: Never used  Substance and Sexual Activity   Alcohol use: Not Currently   Drug use: Not Currently    Types: Marijuana   Sexual activity: Yes    Birth control/protection: I.U.D.    Comment: condoms previously  Other Topics Concern   Not on file  Social History Narrative   Lives with grandparents    Caffeine use: Drinks coffee/tea/soda- 20oz per day (none after starting diamox)   Social Determinants of Health   Financial Resource Strain: Not on file  Food Insecurity: Not on file  Transportation Needs: Not on file  Physical Activity: Not on file  Stress: Not on file  Social Connections: Not on file  Intimate Partner Violence: Not on file    Outpatient Encounter Medications as of 08/02/2021  Medication Sig   albuterol (PROVENTIL) (2.5 MG/3ML) 0.083% nebulizer solution Take 3 mLs (2.5 mg total) by nebulization every 4 (four) hours as needed for wheezing or shortness of breath.   albuterol (VENTOLIN HFA) 108 (90 Base) MCG/ACT inhaler Inhale 2 puffs into the lungs every 4 (four) hours as needed for wheezing or shortness of breath (coughing fits).   Benralizumab (FASENRA PEN) 30 MG/ML SOAJ Inject 1 mL (30 mg total) into the skin every 8 (eight) weeks.   FASENRA PEN 30 MG/ML SOAJ INJECT 1 PEN UNDER THE SKIN EVERY 8 WEEKS.   FLUoxetine (PROZAC) 20 MG capsule Take 1 capsule (20 mg total) by mouth daily.   FLUoxetine (PROZAC) 40 MG capsule Take 1 capsule (40 mg total) by mouth daily. Take with 20 mg (Total of 60 mg daily)   fluticasone (FLONASE) 50 MCG/ACT nasal spray Place 1 spray into both nostrils 2 (two) times daily as needed (nasal congestion).   levocetirizine (XYZAL) 5 MG tablet Take 1 tablet (5 mg total) by mouth every evening.   levonorgestrel (MIRENA, 52 MG,) 20 MCG/24HR IUD Mirena 20 mcg/24 hours (6 yrs) 52 mg intrauterine device  Take 1 device by intrauterine route.   lisinopril-hydrochlorothiazide (ZESTORETIC) 20-25 MG tablet Take 1 tablet by mouth daily.   oxybutynin (DITROPAN-XL) 5 MG 24 hr tablet Take 1 tablet (5 mg total) by mouth at bedtime.   pravastatin (PRAVACHOL) 20 MG tablet Take 1 tablet (20 mg total) by mouth daily.   [DISCONTINUED] tirzepatide (MOUNJARO) 7.5 MG/0.5ML Pen Inject 7.5 mg into the skin once a week.   famotidine (PEPCID) 20 MG tablet Take 1 tablet (20 mg  total) by mouth 2 (two) times daily.   NIFEdipine (ADALAT CC) 60 MG 24 hr tablet Take 60 mg by mouth every morning.  (Patient not taking: Reported on 08/02/2021)   tirzepatide [facility]) 7.5 MG/0.5ML Pen Inject 7.5 mg into the skin once a week.   [DISCONTINUED] cetirizine (ZYRTEC) 10 MG tablet Take 10 mg by mouth daily.   [DISCONTINUED] montelukast (SINGULAIR) 10 MG tablet Take 1 tablet (10 mg total) by mouth at bedtime. (Patient not taking: Reported on 08/02/2021)   No facility-administered encounter medications on file as of 08/02/2021.    Allergies  Allergen Reactions  Cats Claw (Uncaria Tomentosa) Itching, Shortness Of Breath and Swelling   Dust Mite Extract Itching and Shortness Of Breath   Grass Pollen(K-O-R-T-Swt Vern) Itching and Shortness Of Breath   Mixed Feathers Itching, Shortness Of Breath and Swelling    Review of Systems  Constitutional:  Negative for activity change, appetite change, chills, diaphoresis, fatigue, fever and unexpected weight change.  HENT: Negative.    Eyes: Negative.   Respiratory:  Negative for cough, chest tightness and shortness of breath.   Cardiovascular:  Negative for chest pain, palpitations and leg swelling.  Gastrointestinal:  Negative for abdominal pain, blood in stool, constipation, diarrhea, nausea and vomiting.  Endocrine: Negative.   Genitourinary:  Positive for dysuria. Negative for decreased urine volume, difficulty urinating, frequency and urgency.  Musculoskeletal:  Negative for arthralgias and myalgias.  Skin: Negative.   Allergic/Immunologic: Negative.   Neurological:  Negative for dizziness, tremors, seizures, syncope, facial asymmetry, speech difficulty, weakness, light-headedness, numbness and headaches.  Hematological: Negative.   Psychiatric/Behavioral:  Negative for confusion, hallucinations, sleep disturbance and suicidal ideas.   All other systems reviewed and are negative.      Objective:  BP 136/88    Pulse 94    Temp  98.1 F (36.7 C) (Temporal)    Ht '5\' 8"'  (1.727 m)    Wt (!) 314 lb (142.4 kg)    BMI 47.74 kg/m    Wt Readings from Last 3 Encounters:  08/02/21 (!) 314 lb (142.4 kg)  07/19/21 (!) 315 lb (142.9 kg)  06/28/21 (!) 315 lb (142.9 kg)    Physical Exam Vitals and nursing note reviewed.  Constitutional:      General: She is not in acute distress.    Appearance: Normal appearance. She is well-developed and well-groomed. She is morbidly obese. She is not ill-appearing, toxic-appearing or diaphoretic.  HENT:     Head: Normocephalic and atraumatic.     Jaw: There is normal jaw occlusion.     Right Ear: Hearing normal.     Left Ear: Hearing normal.     Nose: Nose normal.     Mouth/Throat:     Lips: Pink.     Mouth: Mucous membranes are moist.     Pharynx: Oropharynx is clear. Uvula midline.  Eyes:     General: Lids are normal.     Extraocular Movements: Extraocular movements intact.     Conjunctiva/sclera: Conjunctivae normal.     Pupils: Pupils are equal, round, and reactive to light.  Neck:     Thyroid: No thyroid mass, thyromegaly or thyroid tenderness.     Vascular: No carotid bruit or JVD.     Trachea: Trachea and phonation normal.  Cardiovascular:     Rate and Rhythm: Normal rate and regular rhythm.     Chest Wall: PMI is not displaced.     Pulses: Normal pulses.          Dorsalis pedis pulses are 2+ on the right side and 2+ on the left side.       Posterior tibial pulses are 2+ on the right side and 2+ on the left side.     Heart sounds: Normal heart sounds. No murmur heard.   No friction rub. No gallop.  Pulmonary:     Effort: Pulmonary effort is normal. No respiratory distress.     Breath sounds: Normal breath sounds. No wheezing.  Abdominal:     General: Bowel sounds are normal. There is no distension or abdominal bruit.  Palpations: Abdomen is soft. There is no hepatomegaly or splenomegaly.     Tenderness: There is no abdominal tenderness. There is no right CVA  tenderness or left CVA tenderness.     Hernia: No hernia is present.  Musculoskeletal:        General: Normal range of motion.     Cervical back: Normal range of motion and neck supple.     Right lower leg: No edema.     Left lower leg: No edema.  Feet:     Right foot:     Protective Sensation: 10 sites tested.  10 sites sensed.     Skin integrity: Skin integrity normal.     Left foot:     Protective Sensation: 10 sites tested.  10 sites sensed.     Skin integrity: Skin integrity normal.  Lymphadenopathy:     Cervical: No cervical adenopathy.  Skin:    General: Skin is warm and dry.     Capillary Refill: Capillary refill takes less than 2 seconds.     Coloration: Skin is not cyanotic, jaundiced or pale.     Findings: No rash.  Neurological:     General: No focal deficit present.     Mental Status: She is alert and oriented to person, place, and time.     Sensory: Sensation is intact.     Motor: Motor function is intact.     Coordination: Coordination is intact.     Gait: Gait is intact.     Deep Tendon Reflexes: Reflexes are normal and symmetric.  Psychiatric:        Attention and Perception: Attention and perception normal.        Mood and Affect: Mood and affect normal.        Speech: Speech normal.        Behavior: Behavior normal. Behavior is cooperative.        Thought Content: Thought content normal.        Cognition and Memory: Cognition and memory normal.        Judgment: Judgment normal.    Results for orders placed or performed in visit on 06/28/21  Urine Culture   Specimen: Urine   UR  Result Value Ref Range   Urine Culture, Routine Final report (A)    Organism ID, Bacteria Klebsiella pneumoniae (A)    Antimicrobial Susceptibility Comment   Microscopic Examination   Urine  Result Value Ref Range   WBC, UA 0-5 0 - 5 /hpf   RBC None seen 0 - 2 /hpf   Epithelial Cells (non renal) 0-10 0 - 10 /hpf   Renal Epithel, UA None seen None seen /hpf   Bacteria, UA  Many (A) None seen/Few  Urinalysis, Routine w reflex microscopic  Result Value Ref Range   Specific Gravity, UA 1.020 1.005 - 1.030   pH, UA 6.0 5.0 - 7.5   Color, UA Yellow Yellow   Appearance Ur Cloudy (A) Clear   Leukocytes,UA Trace (A) Negative   Protein,UA Negative Negative/Trace   Glucose, UA Negative Negative   Ketones, UA Negative Negative   RBC, UA Negative Negative   Bilirubin, UA Negative Negative   Urobilinogen, Ur 0.2 0.2 - 1.0 mg/dL   Nitrite, UA Positive (A) Negative   Microscopic Examination See below:        Pertinent labs & imaging results that were available during my care of the patient were reviewed by me and considered in my medical decision making.  Assessment &  Plan:  Lorrin was seen today for medical management of chronic issues.  Diagnoses and all orders for this visit:  Controlled type 2 diabetes mellitus without complication, without long-term current use of insulin ([facility]) A1C 5.0 in office today. Will continue Mounjaro at current dosing. Pt aware to get eye exam. Follow up in 3 months for reevaluation. Diet and exercise encouraged.  -     Bayer DCA Hb A1c Waived -     CMP14+EGFR -     tirzepatide (MOUNJARO) 7.5 MG/0.5ML Pen; Inject 7.5 mg into the skin once a week.  Hyperlipidemia associated with type 2 diabetes mellitus ([place]) Labs pending. Diet and exercise encouraged. Will discuss medications pending results.  -     Lipid panel -     CMP14+EGFR  Hypertension associated with type 2 diabetes mellitus ([place]) Well controlled on current regimen. Continue. DASH diet and exercise encouraged.  -     CBC with Differential/Platelet  Dysuria Urinalysis in office improving. Culture pending. Will treat if warranted. Increase water intake.  -     Urinalysis, Routine w reflex microscopic -     Urine Culture  HPV and influenza vaccine given today.    Continue all other maintenance medications.  Follow up plan: Return in about 3 months (around  10/30/2021), or if symptoms worsen or fail to improve, for chronic follow up .   Continue healthy lifestyle choices, including diet (rich in fruits, vegetables, and lean proteins, and low in salt and simple carbohydrates) and exercise (at least 30 minutes of moderate physical activity daily).  Educational handout given for DM  The above assessment and management plan was discussed with the patient. The patient verbalized understanding of and has agreed to the management plan. Patient is aware to call the clinic if they develop any new symptoms or if symptoms persist or worsen. Patient is aware when to return to the clinic for a follow-up visit. Patient educated on when it is appropriate to go to the emergency department.   Monia Pouch, FNP-C [facility] [phone]

## 2021-08-02 NOTE — Patient Instructions (Signed)

## 2021-08-05 LAB — URINE CULTURE

## 2021-08-09 ENCOUNTER — Encounter: Payer: Self-pay | Admitting: Family Medicine

## 2021-08-09 MED ORDER — NITROFURANTOIN MONOHYD MACRO 100 MG PO CAPS: 100.0000 mg | ORAL_CAPSULE | Freq: Two times a day (BID) | ORAL | 0 refills | Status: AC

## 2021-08-09 NOTE — Addendum Note (Signed)
Addended by: Baruch Gouty on: 08/09/2021 01:19 PM   Modules accepted: Orders

## 2021-08-11 ENCOUNTER — Encounter: Payer: Self-pay | Admitting: Family Medicine

## 2021-08-17 ENCOUNTER — Other Ambulatory Visit: Payer: Self-pay | Admitting: Family Medicine

## 2021-08-17 ENCOUNTER — Encounter: Payer: Self-pay | Admitting: Family Medicine

## 2021-08-17 DIAGNOSIS — E119 Type 2 diabetes mellitus without complications: Secondary | ICD-10-CM

## 2021-08-17 MED ORDER — TIRZEPATIDE 10 MG/0.5ML ~~LOC~~ SOAJ: 10.0000 mg | SUBCUTANEOUS | 1 refills | Status: DC

## 2021-08-17 NOTE — Telephone Encounter (Signed)
Can send in the 10 mg dosing if you are not having any GI side effects.  ?

## 2021-08-30 DIAGNOSIS — E669 Obesity, unspecified: Secondary | ICD-10-CM | POA: Diagnosis not present

## 2021-08-30 DIAGNOSIS — I10 Essential (primary) hypertension: Secondary | ICD-10-CM | POA: Diagnosis not present

## 2021-08-30 DIAGNOSIS — R110 Nausea: Secondary | ICD-10-CM | POA: Diagnosis not present

## 2021-08-30 DIAGNOSIS — R42 Dizziness and giddiness: Secondary | ICD-10-CM | POA: Diagnosis not present

## 2021-08-30 DIAGNOSIS — R531 Weakness: Secondary | ICD-10-CM | POA: Diagnosis not present

## 2021-08-30 DIAGNOSIS — R630 Anorexia: Secondary | ICD-10-CM | POA: Diagnosis not present

## 2021-08-30 DIAGNOSIS — R10817 Generalized abdominal tenderness: Secondary | ICD-10-CM | POA: Diagnosis not present

## 2021-08-30 DIAGNOSIS — Z720 Tobacco use: Secondary | ICD-10-CM | POA: Diagnosis not present

## 2021-09-14 ENCOUNTER — Encounter: Payer: Medicaid Other | Admitting: Family Medicine

## 2021-09-20 ENCOUNTER — Encounter: Payer: Self-pay | Admitting: Family Medicine

## 2021-09-20 ENCOUNTER — Other Ambulatory Visit (HOSPITAL_COMMUNITY)
Admission: RE | Admit: 2021-09-20 | Discharge: 2021-09-20 | Disposition: A | Discharge status: Home / Self Care | Payer: Medicaid Other | Source: Ambulatory Visit | Attending: Family Medicine | Admitting: Family Medicine

## 2021-09-20 ENCOUNTER — Ambulatory Visit (INDEPENDENT_AMBULATORY_CARE_PROVIDER_SITE_OTHER): Payer: Medicaid Other | Admitting: Family Medicine

## 2021-09-20 VITALS — BP 123/82 | HR 83 | Temp 97.8°F | Ht 68.0 in | Wt 293.0 lb

## 2021-09-20 DIAGNOSIS — Z6841 Body Mass Index (BMI) 40.0 and over, adult: Secondary | ICD-10-CM | POA: Diagnosis not present

## 2021-09-20 DIAGNOSIS — Z0001 Encounter for general adult medical examination with abnormal findings: Secondary | ICD-10-CM | POA: Diagnosis not present

## 2021-09-20 DIAGNOSIS — R6882 Decreased libido: Secondary | ICD-10-CM | POA: Diagnosis not present

## 2021-09-20 DIAGNOSIS — Z124 Encounter for screening for malignant neoplasm of cervix: Secondary | ICD-10-CM

## 2021-09-20 DIAGNOSIS — Z0000 Encounter for general adult medical examination without abnormal findings: Secondary | ICD-10-CM | POA: Insufficient documentation

## 2021-09-20 MED ORDER — BUPROPION HCL ER (XL) 150 MG PO TB24: 150.0000 mg | ORAL_TABLET | Freq: Every day | ORAL | 6 refills | Status: DC

## 2021-09-20 NOTE — Progress Notes (Signed)
?  ? ?Subjective:  ?Patient ID: Cassandra Tucker, female    DOB: [birth year], 31 y.o.   MRN: [id number] ? ?Patient Care Team: ?Sonny Masters, FNP as PCP - General (Family Medicine)  ? ?Chief Complaint:  Gynecologic Exam (Would like a new OBGYN ) ? ? ?HPI: ?Cassandra Tucker is a 31 y.o. female presenting on 09/20/2021 for Gynecologic Exam (Would like a new OBGYN ) ? ? ?Pt presents today for annual physical exam with PAP. She was formerly followed by GYN but they no longer accept her insurance. She has an IUD in place and denies complications. She is sexually active with same partner. She denies vaginal irritation or discharge. No bleeding or dyspareunia. She does report decreased libido over the last 6 months. She has very well controlled diabetes. She is compliant with medications but not diet and exercise. ? ? ?Relevant past medical, surgical, family, and social history reviewed and updated as indicated.  ?Allergies and medications reviewed and updated. Data reviewed: Chart in Epic. ? ? ?Past Medical History:  ?Diagnosis Date  ? Ankle fracture, left 2000  ? Anxiety   ? Arthritis   ? left wrist and left ankle  ? Asthma   ? Back pain   ? Back pain   ? Bronchitis   ? Depression   ? Diabetes ([facility]) 01/11/2016  ? type II  history of  ? Family history of adverse reaction to anesthesia   ? gmother had problems with N/V   ? GERD (gastroesophageal reflux disease)   ? Headache   ? High cholesterol   ? History of kidney stones   ? HLD (hyperlipidemia)   ? HSV-2 infection   ? Hypertension 2013  ? Intracranial hypertension   ? psuedotumor cebrum  ? Joint pain   ? Joint pain   ? Kienbock's disease   ? Leg edema   ? OSA (obstructive sleep apnea)   ? cpap - does not know settings   ? Papilledema   ? Prediabetes   ? Pregnancy induced hypertension   ? Pseudotumor cerebri syndrome   ? RLS (restless legs syndrome)   ? Sinus tachycardia   ? SOB (shortness of breath)   ? ? ?Past Surgical History:  ?Procedure Laterality Date  ? CESAREAN  SECTION N/A 07/17/2019  ? Procedure: CESAREAN SECTION;  Surgeon: Osborn Coho, MD;  Location: [facility] LD ORS;  Service: Obstetrics;  Laterality: N/A;  ? FRACTURE SURGERY  2005  ? ankle  ? FRACTURE SURGERY  2013  ? wrist (deteriorating bone)  ? LAPAROSCOPIC GASTRIC SLEEVE RESECTION N/A 05/08/2017  ? Procedure: LAPAROSCOPIC GASTRIC SLEEVE RESECTION;  Surgeon: Berna Bue, MD;  Location: [facility] ORS;  Service: General;  Laterality: N/A;  ? LAPAROSCOPIC GASTRIC SLEEVE RESECTION  05/08/2017  ? UPPER GI ENDOSCOPY  05/08/2017  ? Procedure: UPPER GI ENDOSCOPY;  Surgeon: Berna Bue, MD;  Location: [facility] ORS;  Service: General;;  ? urethra stretch    ? WRIST SURGERY  2015  ? ? ?Social History  ? ?Socioeconomic History  ? Marital status: Married  ?  Spouse name: Neil Crouch  ? Number of children: 0  ? Years of education: 12+  ? Highest education level: Not on file  ?Occupational History  ? Occupation: dispatcher  ?  Employer: [employer]  ?Tobacco Use  ? Smoking status: Every Day  ?  Packs/day: 1.00  ?  Types: Cigarettes  ?  Last attempt to quit: 10/05/2018  ?  Years since quitting: 2.9  ?  Smokeless tobacco: Never  ?Vaping Use  ? Vaping Use: Never used  ?Substance and Sexual Activity  ? Alcohol use: Not Currently  ? Drug use: Not Currently  ?  Types: Marijuana  ? Sexual activity: Yes  ?  Birth control/protection: I.U.D.  ?  Comment: condoms previously  ?Other Topics Concern  ? Not on file  ?Social History Narrative  ? Lives with grandparents  ? Caffeine use: Drinks coffee/tea/soda- 20oz per day (none after starting diamox)  ? ?Social Determinants of Health  ? ?Financial Resource Strain: Not on file  ?Food Insecurity: Not on file  ?Transportation Needs: Not on file  ?Physical Activity: Not on file  ?Stress: Not on file  ?Social Connections: Not on file  ?Intimate Partner Violence: Not on file  ? ? ?Outpatient Encounter Medications as of 09/20/2021  ?Medication Sig  ? albuterol (PROVENTIL) (2.5 MG/3ML) 0.083% nebulizer solution Take 3 mLs (2.5  mg total) by nebulization every 4 (four) hours as needed for wheezing or shortness of breath.  ? albuterol (VENTOLIN HFA) 108 (90 Base) MCG/ACT inhaler Inhale 2 puffs into the lungs every 4 (four) hours as needed for wheezing or shortness of breath (coughing fits).  ? Benralizumab (FASENRA PEN) 30 MG/ML SOAJ Inject 1 mL (30 mg total) into the skin every 8 (eight) weeks.  ? buPROPion (WELLBUTRIN XL) 150 MG 24 hr tablet Take 1 tablet (150 mg total) by mouth daily.  ? FASENRA PEN 30 MG/ML SOAJ INJECT 1 PEN UNDER THE SKIN EVERY 8 WEEKS.  ? FLUoxetine (PROZAC) 20 MG capsule Take 1 capsule (20 mg total) by mouth daily.  ? FLUoxetine (PROZAC) 40 MG capsule Take 1 capsule (40 mg total) by mouth daily. Take with 20 mg (Total of 60 mg daily)  ? fluticasone (FLONASE) 50 MCG/ACT nasal spray Place 1 spray into both nostrils 2 (two) times daily as needed (nasal congestion).  ? levocetirizine (XYZAL) 5 MG tablet Take 1 tablet (5 mg total) by mouth every evening.  ? levonorgestrel (MIRENA, 52 MG,) 20 MCG/24HR IUD Mirena 20 mcg/24 hours (6 yrs) 52 mg intrauterine device ? Take 1 device by intrauterine route.  ? lisinopril-hydrochlorothiazide (ZESTORETIC) 20-25 MG tablet Take 1 tablet by mouth daily.  ? pravastatin (PRAVACHOL) 20 MG tablet Take 1 tablet (20 mg total) by mouth daily.  ? tirzepatide (MOUNJARO) 10 MG/0.5ML Pen Inject 10 mg into the skin once a week.  ? famotidine (PEPCID) 20 MG tablet Take 1 tablet (20 mg total) by mouth 2 (two) times daily.  ? NIFEdipine (ADALAT CC) 60 MG 24 hr tablet Take 60 mg by mouth every morning.  ? [DISCONTINUED] cetirizine (ZYRTEC) 10 MG tablet Take 10 mg by mouth daily.  ? [DISCONTINUED] oxybutynin (DITROPAN-XL) 5 MG 24 hr tablet Take 1 tablet (5 mg total) by mouth at bedtime. (Patient not taking: Reported on 09/20/2021)  ? ?No facility-administered encounter medications on file as of 09/20/2021.  ? ? ?Allergies  ?Allergen Reactions  ? Cats Claw (Uncaria Tomentosa) Itching, Shortness Of Breath  and Swelling  ? Dust Mite Extract Itching and Shortness Of Breath  ? Grass Pollen(K-O-R-T-Swt Vern) Itching and Shortness Of Breath  ? Mixed Feathers Itching, Shortness Of Breath and Swelling  ? ? ?Review of Systems  ?Constitutional:  Negative for activity change, appetite change, chills, diaphoresis, fatigue, fever and unexpected weight change.  ?HENT: Negative.    ?Eyes: Negative.  Negative for photophobia and visual disturbance.  ?Respiratory:  Negative for cough, chest tightness and shortness of breath.   ?  Cardiovascular:  Negative for chest pain, palpitations and leg swelling.  ?Gastrointestinal:  Negative for abdominal distention, abdominal pain, anal bleeding, blood in stool, constipation, diarrhea, nausea, rectal pain and vomiting.  ?Endocrine: Negative.  Negative for cold intolerance, heat intolerance, polydipsia, polyphagia and polyuria.  ?Genitourinary:  Negative for decreased urine volume, difficulty urinating, dyspareunia, dysuria, enuresis, flank pain, frequency, genital sores, hematuria, pelvic pain, urgency, vaginal bleeding, vaginal discharge and vaginal pain.  ?     Decreased libido  ?Musculoskeletal:  Negative for arthralgias, back pain and myalgias.  ?Skin: Negative.   ?Allergic/Immunologic: Negative.   ?Neurological:  Negative for dizziness, tremors, seizures, syncope, facial asymmetry, speech difficulty, weakness, light-headedness, numbness and headaches.  ?Hematological: Negative.  Negative for adenopathy. Does not bruise/bleed easily.  ?Psychiatric/Behavioral:  Negative for confusion, hallucinations, sleep disturbance and suicidal ideas.   ?All other systems reviewed and are negative. ? ?   ? ?Objective:  ?BP 123/82   Pulse 83   Temp 97.8 ?F (36.6 ?C) (Temporal)   Ht 5\' 8"  (1.727 m)   Wt 293 lb (132.9 kg)   SpO2 100%   BMI 44.55 kg/m?   ? ?Wt Readings from Last 3 Encounters:  ?09/20/21 293 lb (132.9 kg)  ?08/02/21 (!) 314 lb (142.4 kg)  ?07/19/21 (!) 315 lb (142.9 kg)  ? ? ?Physical  Exam ?Vitals and nursing note reviewed.  ?Constitutional:   ?   General: She is not in acute distress. ?   Appearance: Normal appearance. She is well-developed and well-groomed. She is morbidly obese. She is not

## 2021-09-21 DIAGNOSIS — J029 Acute pharyngitis, unspecified: Secondary | ICD-10-CM | POA: Diagnosis not present

## 2021-09-21 DIAGNOSIS — J069 Acute upper respiratory infection, unspecified: Secondary | ICD-10-CM | POA: Diagnosis not present

## 2021-09-21 DIAGNOSIS — E119 Type 2 diabetes mellitus without complications: Secondary | ICD-10-CM | POA: Diagnosis not present

## 2021-09-21 DIAGNOSIS — R0981 Nasal congestion: Secondary | ICD-10-CM | POA: Diagnosis not present

## 2021-09-21 DIAGNOSIS — I10 Essential (primary) hypertension: Secondary | ICD-10-CM | POA: Diagnosis not present

## 2021-09-21 DIAGNOSIS — Z20822 Contact with and (suspected) exposure to covid-19: Secondary | ICD-10-CM | POA: Diagnosis not present

## 2021-09-21 DIAGNOSIS — R0989 Other specified symptoms and signs involving the circulatory and respiratory systems: Secondary | ICD-10-CM | POA: Diagnosis not present

## 2021-09-22 ENCOUNTER — Encounter: Payer: Self-pay | Admitting: Family Medicine

## 2021-09-22 ENCOUNTER — Ambulatory Visit (INDEPENDENT_AMBULATORY_CARE_PROVIDER_SITE_OTHER): Payer: Medicaid Other | Admitting: Family Medicine

## 2021-09-22 VITALS — BP 123/82 | HR 72 | Temp 97.9°F | Ht 68.0 in | Wt 293.6 lb

## 2021-09-22 DIAGNOSIS — E785 Hyperlipidemia, unspecified: Secondary | ICD-10-CM | POA: Diagnosis not present

## 2021-09-22 DIAGNOSIS — B349 Viral infection, unspecified: Secondary | ICD-10-CM

## 2021-09-22 DIAGNOSIS — H6593 Unspecified nonsuppurative otitis media, bilateral: Secondary | ICD-10-CM | POA: Diagnosis not present

## 2021-09-22 DIAGNOSIS — E1169 Type 2 diabetes mellitus with other specified complication: Secondary | ICD-10-CM | POA: Diagnosis not present

## 2021-09-22 DIAGNOSIS — J029 Acute pharyngitis, unspecified: Secondary | ICD-10-CM | POA: Diagnosis not present

## 2021-09-22 MED ORDER — PRAVASTATIN SODIUM 40 MG PO TABS: 40.0000 mg | ORAL_TABLET | Freq: Every day | ORAL | 3 refills | Status: DC

## 2021-09-22 MED ORDER — METHYLPREDNISOLONE ACETATE 40 MG/ML IJ SUSP
40.0000 mg | Freq: Once | INTRAMUSCULAR | Status: AC
  Administered 2021-09-22: 40 mg via INTRAMUSCULAR

## 2021-09-22 NOTE — Progress Notes (Signed)
?  ? ?Subjective:  ?Patient ID: Cassandra Tucker, female    DOB: [birth year], 31 y.o.   MRN: [id number] ? ?Patient Care Team: ?Sonny Masters, FNP as PCP - General (Family Medicine)  ? ?Chief Complaint:  Sore Throat (Lost voice since yesterday- went to [facility] ER yesterday) ? ? ?HPI: ?Cassandra Tucker is a 31 y.o. female presenting on 09/22/2021 for Sore Throat (Lost voice since yesterday- went to [facility] ER yesterday) ? ? ?Pt presents today for evaluation of ear fullness and loss of voice. She was seen in the ED last night and was tested for COVID, Strep, and Influenza. All were negative.  ? ?Labs from physical 09/20/2021 went over with pt in detail and pravastatin dose was increased.  ? ?Sore Throat  ?This is a new problem. The current episode started yesterday. The problem has been unchanged. Neither side of throat is experiencing more pain than the other. There has been no fever. Associated symptoms include ear pain, a hoarse voice and a plugged ear sensation. Pertinent negatives include no abdominal pain, congestion, coughing, diarrhea, drooling, ear discharge, headaches, neck pain, shortness of breath, swollen glands, trouble swallowing or vomiting. She has had no exposure to strep or mono. She has tried acetaminophen for the symptoms. The treatment provided mild relief.   ? ? ?Relevant past medical, surgical, family, and social history reviewed and updated as indicated.  ?Allergies and medications reviewed and updated. Data reviewed: Chart in Epic. ? ? ?Past Medical History:  ?Diagnosis Date  ? Ankle fracture, left 2000  ? Anxiety   ? Arthritis   ? left wrist and left ankle  ? Asthma   ? Back pain   ? Back pain   ? Bronchitis   ? Depression   ? Diabetes (HCC) 01/11/2016  ? type II  history of  ? Family history of adverse reaction to anesthesia   ? gmother had problems with N/V   ? GERD (gastroesophageal reflux disease)   ? Headache   ? High cholesterol   ? History of kidney stones   ? HLD (hyperlipidemia)   ?  HSV-2 infection   ? Hypertension 2013  ? Intracranial hypertension   ? psuedotumor cebrum  ? Joint pain   ? Joint pain   ? Kienbock's disease   ? Leg edema   ? OSA (obstructive sleep apnea)   ? cpap - does not know settings   ? Papilledema   ? Prediabetes   ? Pregnancy induced hypertension   ? Pseudotumor cerebri syndrome   ? RLS (restless legs syndrome)   ? Sinus tachycardia   ? SOB (shortness of breath)   ? ? ?Past Surgical History:  ?Procedure Laterality Date  ? CESAREAN SECTION N/A 07/17/2019  ? Procedure: CESAREAN SECTION;  Surgeon: Osborn Coho, MD;  Location: [facility] LD ORS;  Service: Obstetrics;  Laterality: N/A;  ? FRACTURE SURGERY  2005  ? ankle  ? FRACTURE SURGERY  2013  ? wrist (deteriorating bone)  ? LAPAROSCOPIC GASTRIC SLEEVE RESECTION N/A 05/08/2017  ? Procedure: LAPAROSCOPIC GASTRIC SLEEVE RESECTION;  Surgeon: Berna Bue, MD;  Location: [facility] ORS;  Service: General;  Laterality: N/A;  ? LAPAROSCOPIC GASTRIC SLEEVE RESECTION  05/08/2017  ? UPPER GI ENDOSCOPY  05/08/2017  ? Procedure: UPPER GI ENDOSCOPY;  Surgeon: Berna Bue, MD;  Location: [facility] ORS;  Service: General;;  ? urethra stretch    ? WRIST SURGERY  2015  ? ? ?Social History  ? ?Socioeconomic History  ? Marital  status: Married  ?  Spouse name: Neil Crouch  ? Number of children: 0  ? Years of education: 12+  ? Highest education level: Not on file  ?Occupational History  ? Occupation: dispatcher  ?  Employer: [employer]  ?Tobacco Use  ? Smoking status: Every Day  ?  Packs/day: 1.00  ?  Types: Cigarettes  ?  Last attempt to quit: 10/05/2018  ?  Years since quitting: 2.9  ? Smokeless tobacco: Never  ?Vaping Use  ? Vaping Use: Never used  ?Substance and Sexual Activity  ? Alcohol use: Not Currently  ? Drug use: Not Currently  ?  Types: Marijuana  ? Sexual activity: Yes  ?  Birth control/protection: I.U.D.  ?  Comment: condoms previously  ?Other Topics Concern  ? Not on file  ?Social History Narrative  ? Lives with grandparents  ? Caffeine use: Drinks  coffee/tea/soda- 20oz per day (none after starting diamox)  ? ?Social Determinants of Health  ? ?Financial Resource Strain: Not on file  ?Food Insecurity: Not on file  ?Transportation Needs: Not on file  ?Physical Activity: Not on file  ?Stress: Not on file  ?Social Connections: Not on file  ?Intimate Partner Violence: Not on file  ? ? ?Outpatient Encounter Medications as of 09/22/2021  ?Medication Sig  ? albuterol (PROVENTIL) (2.5 MG/3ML) 0.083% nebulizer solution Take 3 mLs (2.5 mg total) by nebulization every 4 (four) hours as needed for wheezing or shortness of breath.  ? albuterol (VENTOLIN HFA) 108 (90 Base) MCG/ACT inhaler Inhale 2 puffs into the lungs every 4 (four) hours as needed for wheezing or shortness of breath (coughing fits).  ? Benralizumab (FASENRA PEN) 30 MG/ML SOAJ Inject 1 mL (30 mg total) into the skin every 8 (eight) weeks.  ? buPROPion (WELLBUTRIN XL) 150 MG 24 hr tablet Take 1 tablet (150 mg total) by mouth daily.  ? FASENRA PEN 30 MG/ML SOAJ INJECT 1 PEN UNDER THE SKIN EVERY 8 WEEKS.  ? FLUoxetine (PROZAC) 20 MG capsule Take 1 capsule (20 mg total) by mouth daily.  ? FLUoxetine (PROZAC) 40 MG capsule Take 1 capsule (40 mg total) by mouth daily. Take with 20 mg (Total of 60 mg daily)  ? fluticasone (FLONASE) 50 MCG/ACT nasal spray Place 1 spray into both nostrils 2 (two) times daily as needed (nasal congestion).  ? levocetirizine (XYZAL) 5 MG tablet Take 1 tablet (5 mg total) by mouth every evening.  ? levonorgestrel (MIRENA, 52 MG,) 20 MCG/24HR IUD Mirena 20 mcg/24 hours (6 yrs) 52 mg intrauterine device ? Take 1 device by intrauterine route.  ? lisinopril-hydrochlorothiazide (ZESTORETIC) 20-25 MG tablet Take 1 tablet by mouth daily.  ? pravastatin (PRAVACHOL) 40 MG tablet Take 1 tablet (40 mg total) by mouth daily.  ? tirzepatide (MOUNJARO) 10 MG/0.5ML Pen Inject 10 mg into the skin once a week.  ? [DISCONTINUED] pravastatin (PRAVACHOL) 20 MG tablet Take 1 tablet (20 mg total) by mouth  daily.  ? famotidine (PEPCID) 20 MG tablet Take 1 tablet (20 mg total) by mouth 2 (two) times daily.  ? NIFEdipine (ADALAT CC) 60 MG 24 hr tablet Take 60 mg by mouth every morning.  ? [DISCONTINUED] cetirizine (ZYRTEC) 10 MG tablet Take 10 mg by mouth daily.  ? ?Facility-Administered Encounter Medications as of 09/22/2021  ?Medication  ? methylPREDNISolone acetate (DEPO-MEDROL) injection 40 mg  ? ? ?Allergies  ?Allergen Reactions  ? Cats Claw (Uncaria Tomentosa) Itching, Shortness Of Breath and Swelling  ? Dust Mite Extract Itching and Shortness  Of Breath  ? Grass Pollen(K-O-R-T-Swt Vern) Itching and Shortness Of Breath  ? Mixed Feathers Itching, Shortness Of Breath and Swelling  ? ? ?Review of Systems  ?Constitutional:  Negative for activity change, appetite change, chills, diaphoresis, fatigue, fever and unexpected weight change.  ?HENT:  Positive for ear pain, hoarse voice, sore throat and voice change. Negative for congestion, dental problem, drooling, ear discharge, facial swelling, hearing loss, mouth sores, nosebleeds, postnasal drip, rhinorrhea, sinus pressure, sinus pain, sneezing, tinnitus and trouble swallowing.   ?Eyes: Negative.   ?Respiratory:  Negative for cough, chest tightness and shortness of breath.   ?Cardiovascular:  Negative for chest pain, palpitations and leg swelling.  ?Gastrointestinal:  Negative for abdominal pain, blood in stool, constipation, diarrhea, nausea and vomiting.  ?Endocrine: Negative.   ?Genitourinary:  Negative for decreased urine volume, difficulty urinating, dysuria, frequency and urgency.  ?Musculoskeletal:  Negative for arthralgias, myalgias and neck pain.  ?Skin: Negative.   ?Allergic/Immunologic: Negative.   ?Neurological:  Negative for dizziness, tremors, seizures, syncope, facial asymmetry, speech difficulty, weakness, light-headedness, numbness and headaches.  ?Hematological: Negative.   ?Psychiatric/Behavioral:  Negative for confusion, hallucinations, sleep  disturbance and suicidal ideas.   ?All other systems reviewed and are negative. ? ?   ? ?Objective:  ?BP 123/82   Pulse 72   Temp 97.9 ?F (36.6 ?C)   Ht  (1.727 m)   Wt 293 lb 9.6 oz (133.2 kg)   BMI 44.64 kg/m?   ?

## 2021-09-22 NOTE — Progress Notes (Signed)
DE

## 2021-09-26 LAB — CYTOLOGY - PAP
Adequacy: ABSENT
Chlamydia: NEGATIVE
Comment: NEGATIVE
Comment: NEGATIVE
Comment: NEGATIVE
Comment: NEGATIVE
Comment: NORMAL
Diagnosis: NEGATIVE
HSV1: NEGATIVE
HSV2: NEGATIVE
High risk HPV: NEGATIVE
Neisseria Gonorrhea: NEGATIVE
Trichomonas: NEGATIVE

## 2021-09-29 LAB — LIPID PANEL
Chol/HDL Ratio: 5.6 ratio — ABNORMAL HIGH (ref 0.0–4.4)
Cholesterol, Total: 197 mg/dL (ref 100–199)
HDL: 35 mg/dL — ABNORMAL LOW (ref >39)
LDL Chol Calc (NIH): 131 mg/dL — ABNORMAL HIGH (ref 0–99)
Triglycerides: 170 mg/dL — ABNORMAL HIGH (ref 0–149)
VLDL Cholesterol Cal: 31 mg/dL (ref 5–40)

## 2021-09-29 LAB — CMP14+EGFR
ALT: 9 IU/L (ref 0–32)
AST: 10 IU/L (ref 0–40)
Albumin/Globulin Ratio: 1.8 (ref 1.2–2.2)
Albumin: 4.3 g/dL (ref 3.9–5.0)
Alkaline Phosphatase: 74 IU/L (ref 44–121)
BUN/Creatinine Ratio: 15 (ref 9–23)
BUN: 10 mg/dL (ref 6–20)
Bilirubin Total: 0.6 mg/dL (ref 0.0–1.2)
CO2: 26 mmol/L (ref 20–29)
Calcium: 9.6 mg/dL (ref 8.7–10.2)
Chloride: 104 mmol/L (ref 96–106)
Creatinine, Ser: 0.68 mg/dL (ref 0.57–1.00)
Globulin, Total: 2.4 g/dL (ref 1.5–4.5)
Glucose: 64 mg/dL — ABNORMAL LOW (ref 70–99)
Potassium: 4.2 mmol/L (ref 3.5–5.2)
Sodium: 142 mmol/L (ref 134–144)
Total Protein: 6.7 g/dL (ref 6.0–8.5)
eGFR: 121 mL/min/{1.73_m2} (ref >59)

## 2021-09-29 LAB — CBC WITH DIFFERENTIAL/PLATELET
Basophils Absolute: 0 10*3/uL (ref 0.0–0.2)
Basos: 0 %
EOS (ABSOLUTE): 0 10*3/uL (ref 0.0–0.4)
Eos: 0 %
Hematocrit: 40.4 % (ref 34.0–46.6)
Hemoglobin: 13 g/dL (ref 11.1–15.9)
Immature Grans (Abs): 0 10*3/uL (ref 0.0–0.1)
Immature Granulocytes: 0 %
Lymphocytes Absolute: 2.7 10*3/uL (ref 0.7–3.1)
Lymphs: 40 %
MCH: 26.3 pg — ABNORMAL LOW (ref 26.6–33.0)
MCHC: 32.2 g/dL (ref 31.5–35.7)
MCV: 82 fL (ref 79–97)
Monocytes Absolute: 0.4 10*3/uL (ref 0.1–0.9)
Monocytes: 5 %
Neutrophils Absolute: 3.8 10*3/uL (ref 1.4–7.0)
Neutrophils: 55 %
Platelets: 262 10*3/uL (ref 150–450)
RBC: 4.95 x10E6/uL (ref 3.77–5.28)
RDW: 13.7 % (ref 11.7–15.4)
WBC: 6.9 10*3/uL (ref 3.4–10.8)

## 2021-09-29 LAB — THYROID PANEL WITH TSH
Free Thyroxine Index: 2.7 (ref 1.2–4.9)
T3 Uptake Ratio: 28 % (ref 24–39)
T4, Total: 9.5 ug/dL (ref 4.5–12.0)
TSH: 1.18 u[IU]/mL (ref 0.450–4.500)

## 2021-10-16 ENCOUNTER — Other Ambulatory Visit: Payer: Self-pay | Admitting: Family Medicine

## 2021-10-16 DIAGNOSIS — R6882 Decreased libido: Secondary | ICD-10-CM

## 2021-10-26 ENCOUNTER — Other Ambulatory Visit: Payer: Self-pay | Admitting: Family Medicine

## 2021-10-26 DIAGNOSIS — F339 Major depressive disorder, recurrent, unspecified: Secondary | ICD-10-CM

## 2021-10-26 DIAGNOSIS — F411 Generalized anxiety disorder: Secondary | ICD-10-CM

## 2021-11-08 ENCOUNTER — Other Ambulatory Visit: Payer: Self-pay | Admitting: Family Medicine

## 2021-11-08 DIAGNOSIS — F339 Major depressive disorder, recurrent, unspecified: Secondary | ICD-10-CM

## 2021-11-08 DIAGNOSIS — F411 Generalized anxiety disorder: Secondary | ICD-10-CM

## 2021-11-15 DIAGNOSIS — I10 Essential (primary) hypertension: Secondary | ICD-10-CM | POA: Diagnosis not present

## 2021-11-15 DIAGNOSIS — B356 Tinea cruris: Secondary | ICD-10-CM | POA: Diagnosis not present

## 2021-11-15 DIAGNOSIS — R399 Unspecified symptoms and signs involving the genitourinary system: Secondary | ICD-10-CM | POA: Diagnosis not present

## 2021-11-29 ENCOUNTER — Ambulatory Visit: Payer: Medicaid Other | Admitting: Allergy

## 2021-11-29 ENCOUNTER — Ambulatory Visit: Payer: Medicaid Other | Admitting: Family Medicine

## 2021-11-29 NOTE — Progress Notes (Deleted)
Follow Up Note  RE: Cassandra Tucker MRN: [id number] DOB: Sep 04, 1990 Date of Office Visit: 11/29/2021  Referring provider: Sonny Masters, FNP Primary care provider: Sonny Masters, FNP  Chief Complaint: No chief complaint on file.  History of Present Illness: I had the pleasure of seeing Cassandra Tucker for a follow up visit at the [facility] on 11/29/2021. She is a 31 y.o. female, who is being followed for asthma on Fasenra, allergic rhinoconjunctivitis and rash. Her previous allergy office visit was on 07/19/2021 with Dr. Selena Batten. Today is a regular follow up visit.  Severe persistent asthma without complication Past history - Symptoms of chest tightness, shortness of breath, coughing, wheezing, nocturnal awakenings for 1 year. History of reflux but no longer on PPI. Ex-smoker. 2021 spirometry showed: normal pattern and 13% improvement in FEV1 post bronchodilator treatment. Clinically feeling better. Interim history - Well-controlled with Fasenra injections. Noticed increased symptoms when late by 2 weeks.  Today's spirometry was normal - improved from previous one.  Continue Fasenra injections at home every 8 weeks.  Daily controller medication(s): Restart Singulair (montelukast) 10mg  daily at night. During upper respiratory infections/asthma flares:  Start Symbicort 2 puffs twice a day with spacer and rinse mouth afterwards for 1-2 weeks until your breathing symptoms return to baseline.  Pretreat with albuterol 2 puffs or albuterol nebulizer.  If you need to use your albuterol nebulizer machine back to back within 15-30 minutes with no relief then please go to the ER/urgent care for further evaluation.  May use albuterol rescue inhaler 2 puffs every 4 to 6 hours as needed for shortness of breath, chest tightness, coughing, and wheezing. May use albuterol rescue inhaler 2 puffs 5 to 15 minutes prior to strenuous physical activities. Monitor frequency of use.  Get  spirometry at next visit.   Seasonal and perennial allergic rhinoconjunctivitis Past history - Perennial rhino conjunctivitis symptoms for 20+ years with worsening in the spring. No previous ENT evaluation. Tried Claritin, Flonase and Zyrtec with some benefit. 2021 skin testing showed: Positive to grass, dust mites, cats, feathers. Negative to common foods.  Interim history - asymptomatic with no daily meds.  Continue environmental control measures as below. Start Singulair (montelukast) 10mg  daily at night.  May use over the counter antihistamines such as Zyrtec (cetirizine), Claritin (loratadine), Allegra (fexofenadine), or Xyzal (levocetirizine) daily. May take it twice a day if needed.  May use Flonase (fluticasone) nasal spray 1 spray per nostril twice a day as needed for nasal congestion.    Rash and other nonspecific skin eruption Rash on leg x 1 week. No trigger noted. Resolved. Monitor symptoms and take pictures.   Return in about 4 months (around 11/16/2021).  Assessment and Plan: Cassandra Tucker is a 31 y.o. female with: No problem-specific Assessment & Plan notes found for this encounter.  No follow-ups on file.  No orders of the defined types were placed in this encounter.  Lab Orders  No laboratory test(s) ordered today    Diagnostics: Spirometry:  Tracings reviewed. Her effort: {Blank single:19197::"Good reproducible efforts.","It was hard to get consistent efforts and there is a question as to whether this reflects a maximal maneuver.","Poor effort, data can not be interpreted."} FVC: ***L FEV1: ***L, ***% predicted FEV1/FVC ratio: ***% Interpretation: {Blank single:19197::"Spirometry consistent with mild obstructive disease","Spirometry consistent with moderate obstructive disease","Spirometry consistent with severe obstructive disease","Spirometry consistent with possible restrictive disease","Spirometry consistent with mixed obstructive and restrictive disease","Spirometry  uninterpretable due to technique","Spirometry consistent with normal  pattern","No overt abnormalities noted given today's efforts"}.  Please see scanned spirometry results for details.  Skin Testing: {Blank single:19197::"Select foods","Environmental allergy panel","Environmental allergy panel and select foods","Food allergy panel","None","Deferred due to recent antihistamines use"}. *** Results discussed with patient/family.   Medication List:  Current Outpatient Medications  Medication Sig Dispense Refill   buPROPion (WELLBUTRIN XL) 150 MG 24 hr tablet TAKE 1 TABLET BY MOUTH EVERY DAY 90 tablet 0   albuterol (PROVENTIL) (2.5 MG/3ML) 0.083% nebulizer solution Take 3 mLs (2.5 mg total) by nebulization every 4 (four) hours as needed for wheezing or shortness of breath. 75 mL 1   albuterol (VENTOLIN HFA) 108 (90 Base) MCG/ACT inhaler Inhale 2 puffs into the lungs every 4 (four) hours as needed for wheezing or shortness of breath (coughing fits). 18 g 1   Benralizumab (FASENRA PEN) 30 MG/ML SOAJ Inject 1 mL (30 mg total) into the skin every 8 (eight) weeks. 1 mL 6   famotidine (PEPCID) 20 MG tablet Take 1 tablet (20 mg total) by mouth 2 (two) times daily. 180 tablet 3   FASENRA PEN 30 MG/ML SOAJ INJECT 1 PEN UNDER THE SKIN EVERY 8 WEEKS. 1 mL 6   FLUoxetine (PROZAC) 20 MG capsule Take 1 capsule (20 mg total) by mouth daily. 30 capsule 3   FLUoxetine (PROZAC) 40 MG capsule TAKE 1 CAPSULE (40 MG TOTAL) BY MOUTH DAILY. TAKE WITH 20 MG (TOTAL OF 60 MG DAILY) 90 capsule 0   fluticasone (FLONASE) 50 MCG/ACT nasal spray Place 1 spray into both nostrils 2 (two) times daily as needed (nasal congestion). 16 g 5   levocetirizine (XYZAL) 5 MG tablet Take 1 tablet (5 mg total) by mouth every evening. 30 tablet 5   levonorgestrel (MIRENA, 52 MG,) 20 MCG/24HR IUD Mirena 20 mcg/24 hours (6 yrs) 52 mg intrauterine device  Take 1 device by intrauterine route.     lisinopril-hydrochlorothiazide (ZESTORETIC) 20-25  MG tablet Take 1 tablet by mouth daily. 90 tablet 3   NIFEdipine (ADALAT CC) 60 MG 24 hr tablet Take 60 mg by mouth every morning.     pravastatin (PRAVACHOL) 40 MG tablet Take 1 tablet (40 mg total) by mouth daily. 90 tablet 3   tirzepatide (MOUNJARO) 10 MG/0.5ML Pen Inject 10 mg into the skin once a week. 6 mL 1   No current facility-administered medications for this visit.   Allergies: Allergies  Allergen Reactions   Cats Claw (Uncaria Tomentosa) Itching, Shortness Of Breath and Swelling   Dust Mite Extract Itching and Shortness Of Breath   Grass Pollen(K-O-R-T-Swt Vern) Itching and Shortness Of Breath   Mixed Feathers Itching, Shortness Of Breath and Swelling   I reviewed her past medical history, social history, family history, and environmental history and no significant changes have been reported from her previous visit.  Review of Systems  Constitutional:  Negative for appetite change, chills, fever and unexpected weight change.  HENT:  Negative for congestion, postnasal drip, rhinorrhea and sneezing.   Eyes:  Negative for itching.  Respiratory:  Negative for cough, chest tightness, shortness of breath and wheezing.   Cardiovascular:  Negative for chest pain.  Gastrointestinal:  Negative for abdominal pain.  Genitourinary:  Negative for difficulty urinating.  Skin:  Negative for rash.  Allergic/Immunologic: Positive for environmental allergies. Negative for food allergies.  Neurological:  Negative for headaches.    Objective: There were no vitals taken for this visit. There is no height or weight on file to calculate BMI. Physical Exam Vitals and  nursing note reviewed.  Constitutional:      Appearance: Normal appearance. She is well-developed. She is obese.  HENT:     Head: Normocephalic and atraumatic.     Right Ear: Tympanic membrane and external ear normal.     Left Ear: Tympanic membrane and external ear normal.     Nose: Nose normal.     Mouth/Throat:      Mouth: Mucous membranes are moist.     Pharynx: Oropharynx is clear.  Eyes:     Conjunctiva/sclera: Conjunctivae normal.  Cardiovascular:     Rate and Rhythm: Normal rate and regular rhythm.     Heart sounds: Normal heart sounds. No murmur heard.    No friction rub. No gallop.  Pulmonary:     Effort: Pulmonary effort is normal.     Breath sounds: No wheezing, rhonchi or rales.  Musculoskeletal:     Cervical back: Neck supple.  Skin:    General: Skin is warm.     Findings: No rash.  Neurological:     Mental Status: She is alert and oriented to person, place, and time.  Psychiatric:        Behavior: Behavior normal.    Previous notes and tests were reviewed. The plan was reviewed with the patient/family, and all questions/concerned were addressed.  It was my pleasure to see Cassandra Tucker today and participate in her care. Please feel free to contact me with any questions or concerns.  Sincerely,  Rexene Alberts, DO Allergy & Immunology  [facility] office: [place] office: [phone]

## 2021-11-30 ENCOUNTER — Encounter (HOSPITAL_COMMUNITY): Payer: Self-pay | Admitting: *Deleted

## 2021-12-01 ENCOUNTER — Other Ambulatory Visit: Payer: Self-pay

## 2021-12-01 ENCOUNTER — Encounter: Payer: Self-pay | Admitting: Family Medicine

## 2021-12-01 MED ORDER — BLOOD GLUCOSE METER KIT: PACK | 0 refills | Status: DC

## 2021-12-07 ENCOUNTER — Ambulatory Visit: Payer: Medicaid Other | Admitting: Family Medicine

## 2021-12-16 ENCOUNTER — Ambulatory Visit: Payer: Medicaid Other | Admitting: Family Medicine

## 2021-12-16 ENCOUNTER — Telehealth: Payer: Self-pay

## 2021-12-16 ENCOUNTER — Encounter: Payer: Self-pay | Admitting: Family Medicine

## 2021-12-16 ENCOUNTER — Other Ambulatory Visit: Payer: Self-pay | Admitting: Family Medicine

## 2021-12-16 VITALS — BP 112/71 | HR 88 | Temp 98.1°F | Ht 68.0 in | Wt 270.0 lb

## 2021-12-16 DIAGNOSIS — R400 Somnolence: Secondary | ICD-10-CM | POA: Diagnosis not present

## 2021-12-16 DIAGNOSIS — Z23 Encounter for immunization: Secondary | ICD-10-CM

## 2021-12-16 DIAGNOSIS — F339 Major depressive disorder, recurrent, unspecified: Secondary | ICD-10-CM | POA: Diagnosis not present

## 2021-12-16 MED ORDER — CARIPRAZINE HCL 1.5 MG PO CAPS
1.5000 mg | ORAL_CAPSULE | Freq: Every day | ORAL | 6 refills | Status: DC
Start: 2021-12-16 — End: 2022-01-26

## 2021-12-16 NOTE — Progress Notes (Signed)
Subjective:  Patient ID: Cassandra Tucker, female    DOB: 1990-09-25, 31 y.o.   MRN: [id number]  Patient Care Team: Baruch Gouty, FNP as PCP - General (Family Medicine)   Chief Complaint:  Medical Management of Chronic Issues   HPI: Cassandra Tucker is a 31 y.o. female presenting on 12/16/2021 for Medical Management of Chronic Issues  Pt presents today for depression follow up. She has been taking medications as prescribed but feels her symptoms are not well controlled. She has mood swings, agitation, depressive mood, ongoing lack of interest and fatigue. Wellbutrin was added to regimen without benefit. She would like to try additional medication as this is not helping.     12/16/2021    9:19 AM 09/22/2021    9:09 AM 09/20/2021   10:36 AM 08/02/2021   10:38 AM 06/28/2021   10:10 AM  Depression screen PHQ 2/9  Decreased Interest '3 2 1 2 3  ' Down, Depressed, Hopeless '2 2 1 2 3  ' PHQ - 2 Score '5 4 2 4 6  ' Altered sleeping '3 2 3 1 3  ' Tired, decreased energy '3 2 2 3 3  ' Change in appetite 3 2 0 3 3  Feeling bad or failure about yourself  0 0 0 0 3  Trouble concentrating '1 2 2 3 3  ' Moving slowly or fidgety/restless '1 1 2 2 3  ' Suicidal thoughts 0 0 0 0 0  PHQ-9 Score '16 13 11 16 24  ' Difficult doing work/chores Extremely dIfficult Somewhat difficult Somewhat difficult  Extremely dIfficult      12/16/2021    9:19 AM 09/22/2021    9:09 AM 09/20/2021   10:37 AM 08/02/2021   10:39 AM  GAD 7 : Generalized Anxiety Score  Nervous, Anxious, on Edge '3 3 3 3  ' Control/stop worrying '2 3 3 3  ' Worry too much - different things '3 3 3 3  ' Trouble relaxing '3 3 3 3  ' Restless '3 3 3 3  ' Easily annoyed or irritable '3 3 3 3  ' Afraid - awful might happen 0 0 1 0  Total GAD 7 Score '17 18 19 18  ' Anxiety Difficulty Extremely difficult Somewhat difficult Somewhat difficult Somewhat difficult   She reports excessive daytime sleepiness. States she can fall asleep anytime during the day. States she was diagnosed  with sleep apnea in the past but never started CPAP use.   Do you snore loudly (louder than talking or loud enough to be heard through closed doors)?  Yes Do you often feel tired, fatigued, or sleepy during daytime?  Yes Has anyone observed you stop breathing during your sleep?  No Do you have or are you being treated for high blood pressure?  Yes BMI more than 35 kg/m2?  Yes Age over 39 years?  No Neck circumference greater than 40 cm?  Yes Female gender?  No Total "Yes" Answers: 5    Relevant past medical, surgical, family, and social history reviewed and updated as indicated.  Allergies and medications reviewed and updated. Data reviewed: Chart in Epic.   Past Medical History:  Diagnosis Date   Ankle fracture, left 2000   Anxiety    Arthritis    left wrist and left ankle   Asthma    Back pain    Back pain    Bronchitis    Depression    Diabetes ([place]) 01/11/2016   type II  history of   Family history of adverse reaction to  anesthesia    gmother had problems with N/V    GERD (gastroesophageal reflux disease)    Headache    High cholesterol    History of kidney stones    HLD (hyperlipidemia)    HSV-2 infection    Hypertension 2013   Intracranial hypertension    psuedotumor cebrum   Joint pain    Joint pain    Kienbock's disease    Leg edema    OSA (obstructive sleep apnea)    cpap - does not know settings    Papilledema    Prediabetes    Pregnancy induced hypertension    Pseudotumor cerebri syndrome    RLS (restless legs syndrome)    Sinus tachycardia    SOB (shortness of breath)     Past Surgical History:  Procedure Laterality Date   CESAREAN SECTION N/A 07/17/2019   Procedure: CESAREAN SECTION;  Surgeon: Everett Graff, MD;  Location: [facility];  Service: Obstetrics;  Laterality: N/A;   FRACTURE SURGERY  2005   ankle   FRACTURE SURGERY  2013   wrist (deteriorating bone)   LAPAROSCOPIC GASTRIC SLEEVE RESECTION N/A 05/08/2017   Procedure:  LAPAROSCOPIC GASTRIC SLEEVE RESECTION;  Surgeon: Clovis Riley, MD;  Location: [facility];  Service: General;  Laterality: N/A;   LAPAROSCOPIC GASTRIC SLEEVE RESECTION  05/08/2017   UPPER GI ENDOSCOPY  05/08/2017   Procedure: UPPER GI ENDOSCOPY;  Surgeon: Clovis Riley, MD;  Location: [facility];  Service: General;;   urethra stretch     WRIST SURGERY  2015    Social History   Socioeconomic History   Marital status: Married    Spouse name: Cecilie Kicks   Number of children: 0   Years of education: 12+   Highest education level: Not on file  Occupational History   Occupation: Solicitor: [employer]  Tobacco Use   Smoking status: Every Day    Packs/day: 1.00    Types: Cigarettes    Last attempt to quit: 10/05/2018    Years since quitting: 3.2   Smokeless tobacco: Never  Vaping Use   Vaping Use: Never used  Substance and Sexual Activity   Alcohol use: Not Currently   Drug use: Not Currently    Types: Marijuana   Sexual activity: Yes    Birth control/protection: I.U.D.    Comment: condoms previously  Other Topics Concern   Not on file  Social History Narrative   Lives with grandparents   Caffeine use: Drinks coffee/tea/soda- 20oz per day (none after starting diamox)   Social Determinants of Health   Financial Resource Strain: Not on file  Food Insecurity: Not on file  Transportation Needs: Not on file  Physical Activity: Not on file  Stress: Not on file  Social Connections: Not on file  Intimate Partner Violence: Not on file    Outpatient Encounter Medications as of 12/16/2021  Medication Sig   albuterol (PROVENTIL) (2.5 MG/3ML) 0.083% nebulizer solution Take 3 mLs (2.5 mg total) by nebulization every 4 (four) hours as needed for wheezing or shortness of breath.   albuterol (VENTOLIN HFA) 108 (90 Base) MCG/ACT inhaler Inhale 2 puffs into the lungs every 4 (four) hours as needed for wheezing or shortness of breath (coughing fits).   Benralizumab (FASENRA PEN) 30 MG/ML  SOAJ Inject 1 mL (30 mg total) into the skin every 8 (eight) weeks.   blood glucose meter kit and supplies Dispense based on patient and insurance preference. Use up to four times daily  as directed. (FOR ICD-10 E10.9, E11.9).   buPROPion (WELLBUTRIN XL) 150 MG 24 hr tablet TAKE 1 TABLET BY MOUTH EVERY DAY   cariprazine (VRAYLAR) 1.5 MG capsule Take 1 capsule (1.5 mg total) by mouth daily.   FASENRA PEN 30 MG/ML SOAJ INJECT 1 PEN UNDER THE SKIN EVERY 8 WEEKS.   FLUoxetine (PROZAC) 20 MG capsule Take 1 capsule (20 mg total) by mouth daily.   FLUoxetine (PROZAC) 40 MG capsule TAKE 1 CAPSULE (40 MG TOTAL) BY MOUTH DAILY. TAKE WITH 20 MG (TOTAL OF 60 MG DAILY)   fluticasone (FLONASE) 50 MCG/ACT nasal spray Place 1 spray into both nostrils 2 (two) times daily as needed (nasal congestion).   levocetirizine (XYZAL) 5 MG tablet Take 1 tablet (5 mg total) by mouth every evening.   levonorgestrel (MIRENA, 52 MG,) 20 MCG/24HR IUD Mirena 20 mcg/24 hours (6 yrs) 52 mg intrauterine device  Take 1 device by intrauterine route.   lisinopril-hydrochlorothiazide (ZESTORETIC) 20-25 MG tablet Take 1 tablet by mouth daily.   NYSTATIN powder Apply topically 2 (two) times daily.   pravastatin (PRAVACHOL) 40 MG tablet Take 1 tablet (40 mg total) by mouth daily.   tirzepatide (MOUNJARO) 10 MG/0.5ML Pen Inject 10 mg into the skin once a week.   famotidine (PEPCID) 20 MG tablet Take 1 tablet (20 mg total) by mouth 2 (two) times daily.   NIFEdipine (ADALAT CC) 60 MG 24 hr tablet Take 60 mg by mouth every morning.   [DISCONTINUED] cetirizine (ZYRTEC) 10 MG tablet Take 10 mg by mouth daily.   No facility-administered encounter medications on file as of 12/16/2021.    Allergies  Allergen Reactions   Cats Claw (Uncaria Tomentosa) Itching, Shortness Of Breath and Swelling   Dust Mite Extract Itching and Shortness Of Breath   Grass Pollen(K-O-R-T-Swt Vern) Itching and Shortness Of Breath   Mixed Feathers Itching, Shortness Of  Breath and Swelling    Review of Systems  Constitutional:  Positive for activity change, appetite change and fatigue. Negative for chills, diaphoresis, fever and unexpected weight change.  HENT: Negative.    Eyes: Negative.  Negative for photophobia and visual disturbance.  Respiratory:  Negative for cough, chest tightness and shortness of breath.   Cardiovascular:  Negative for chest pain, palpitations and leg swelling.  Gastrointestinal:  Negative for abdominal pain, blood in stool, constipation, diarrhea, nausea and vomiting.  Endocrine: Negative.   Genitourinary:  Negative for decreased urine volume, difficulty urinating, dysuria, frequency and urgency.  Musculoskeletal:  Negative for arthralgias and myalgias.  Skin: Negative.   Allergic/Immunologic: Negative.   Neurological:  Negative for dizziness, tremors, seizures, syncope, facial asymmetry, speech difficulty, weakness, light-headedness, numbness and headaches.  Hematological: Negative.   Psychiatric/Behavioral:  Positive for agitation, decreased concentration, dysphoric mood and sleep disturbance. Negative for behavioral problems, confusion, hallucinations, self-injury and suicidal ideas. The patient is nervous/anxious. The patient is not hyperactive.   All other systems reviewed and are negative.       Objective:  BP 112/71   Pulse 88   Temp 98.1 F (36.7 C)   Ht '5\' 8"'  (1.727 m)   Wt 270 lb (122.5 kg)   SpO2 98%   BMI 41.05 kg/m    Wt Readings from Last 3 Encounters:  12/16/21 270 lb (122.5 kg)  09/22/21 293 lb 9.6 oz (133.2 kg)  09/20/21 293 lb (132.9 kg)    Physical Exam Vitals and nursing note reviewed.  Constitutional:      General: She is not in acute  distress.    Appearance: Normal appearance. She is well-developed and well-groomed. She is morbidly obese. She is not ill-appearing, toxic-appearing or diaphoretic.  HENT:     Head: Normocephalic and atraumatic.     Jaw: There is normal jaw occlusion.      Right Ear: Hearing normal.     Left Ear: Hearing normal.     Nose: Nose normal.     Mouth/Throat:     Lips: Pink.     Mouth: Mucous membranes are moist.     Pharynx: Oropharynx is clear. Uvula midline.  Eyes:     General: Lids are normal.     Pupils: Pupils are equal, round, and reactive to light.  Neck:     Thyroid: No thyroid mass, thyromegaly or thyroid tenderness.     Vascular: No carotid bruit or JVD.     Trachea: Trachea and phonation normal.  Cardiovascular:     Rate and Rhythm: Normal rate and regular rhythm.     Chest Wall: PMI is not displaced.     Pulses: Normal pulses.     Heart sounds: Normal heart sounds. No murmur heard.    No friction rub. No gallop.  Pulmonary:     Effort: Pulmonary effort is normal. No respiratory distress.     Breath sounds: Normal breath sounds. No wheezing.  Abdominal:     General: There is no abdominal bruit.     Palpations: There is no hepatomegaly or splenomegaly.  Musculoskeletal:        General: Normal range of motion.     Cervical back: Normal range of motion and neck supple.     Right lower leg: No edema.     Left lower leg: No edema.  Lymphadenopathy:     Cervical: No cervical adenopathy.  Skin:    General: Skin is warm and dry.     Capillary Refill: Capillary refill takes less than 2 seconds.     Coloration: Skin is not cyanotic, jaundiced or pale.     Findings: No rash.  Neurological:     General: No focal deficit present.     Mental Status: She is alert and oriented to person, place, and time.     Sensory: Sensation is intact.     Motor: Motor function is intact.     Coordination: Coordination is intact.     Gait: Gait is intact.     Deep Tendon Reflexes: Reflexes are normal and symmetric.  Psychiatric:        Attention and Perception: Attention and perception normal.        Mood and Affect: Mood and affect normal.        Speech: Speech normal.        Behavior: Behavior normal. Behavior is cooperative.        Thought  Content: Thought content normal.        Cognition and Memory: Cognition and memory normal.        Judgment: Judgment normal.     Results for orders placed or performed in visit on 09/20/21  CBC with Differential/Platelet  Result Value Ref Range   WBC 6.9 3.4 - 10.8 x10E3/uL   RBC 4.95 3.77 - 5.28 x10E6/uL   Hemoglobin 13.0 11.1 - 15.9 g/dL   Hematocrit 40.4 34.0 - 46.6 %   MCV 82 79 - 97 fL   MCH 26.3 (L) 26.6 - 33.0 pg   MCHC 32.2 31.5 - 35.7 g/dL   RDW 13.7 11.7 - 15.4 %  Platelets 262 150 - 450 x10E3/uL   Neutrophils 55 Not Estab. %   Lymphs 40 Not Estab. %   Monocytes 5 Not Estab. %   Eos 0 Not Estab. %   Basos 0 Not Estab. %   Neutrophils Absolute 3.8 1.4 - 7.0 x10E3/uL   Lymphocytes Absolute 2.7 0.7 - 3.1 x10E3/uL   Monocytes Absolute 0.4 0.1 - 0.9 x10E3/uL   EOS (ABSOLUTE) 0.0 0.0 - 0.4 x10E3/uL   Basophils Absolute 0.0 0.0 - 0.2 x10E3/uL   Immature Granulocytes 0 Not Estab. %   Immature Grans (Abs) 0.0 0.0 - 0.1 x10E3/uL  CMP14+EGFR  Result Value Ref Range   Glucose 64 (L) 70 - 99 mg/dL   BUN 10 6 - 20 mg/dL   Creatinine, Ser 0.68 0.57 - 1.00 mg/dL   eGFR 121 >59 mL/min/1.73   BUN/Creatinine Ratio 15 9 - 23   Sodium 142 134 - 144 mmol/L   Potassium 4.2 3.5 - 5.2 mmol/L   Chloride 104 96 - 106 mmol/L   CO2 26 20 - 29 mmol/L   Calcium 9.6 8.7 - 10.2 mg/dL   Total Protein 6.7 6.0 - 8.5 g/dL   Albumin 4.3 3.9 - 5.0 g/dL   Globulin, Total 2.4 1.5 - 4.5 g/dL   Albumin/Globulin Ratio 1.8 1.2 - 2.2   Bilirubin Total 0.6 0.0 - 1.2 mg/dL   Alkaline Phosphatase 74 44 - 121 IU/L   AST 10 0 - 40 IU/L   ALT 9 0 - 32 IU/L  Lipid panel  Result Value Ref Range   Cholesterol, Total 197 100 - 199 mg/dL   Triglycerides 170 (H) 0 - 149 mg/dL   HDL 35 (L) >39 mg/dL   VLDL Cholesterol Cal 31 5 - 40 mg/dL   LDL Chol Calc (NIH) 131 (H) 0 - 99 mg/dL   Chol/HDL Ratio 5.6 (H) 0.0 - 4.4 ratio  Thyroid Panel With TSH  Result Value Ref Range   TSH 1.180 0.450 - 4.500 uIU/mL    T4, Total 9.5 4.5 - 12.0 ug/dL   T3 Uptake Ratio 28 24 - 39 %   Free Thyroxine Index 2.7 1.2 - 4.9  Cytology - PAP([place])  Result Value Ref Range   High risk HPV Negative    Neisseria Gonorrhea Negative    Chlamydia Negative    Trichomonas Negative    HSV1 Negative    HSV2 Negative    Adequacy      Satisfactory for evaluation; transformation zone component ABSENT.   Diagnosis      - Negative for intraepithelial lesion or malignancy (NILM)   Comment Normal Reference Range HPV - Negative    Comment Normal Reference Range Trichomonas - Negative    Comment Normal Reference [place] Chlamydia - Negative    Comment      Normal Reference Range Neisseria Gonorrhea - Negative   Comment Normal Reference Range HSV - Negative        Pertinent labs & imaging results that were available during my care of the patient were reviewed by me and considered in my medical decision making.  Assessment & Plan:  Jood was seen today for medical management of chronic issues.  Diagnoses and all orders for this visit:  Daytime sleepiness Reported prior history of sleep apnea, not on CPAP therapy. Will place referral for new sleep study.  -     Ambulatory referral to Neurology  Depression, recurrent ([place]) Discussed slow titration off of Wellbutrin and then adding Vraylar to regimen.  No SI or HI reported. Pt to follow up in office in 4 weeks or sooner if warranted.  -     cariprazine (VRAYLAR) 1.5 MG capsule; Take 1 capsule (1.5 mg total) by mouth daily.  Immunization due -     HPV 9-valent vaccine,Recombinat     Continue all other maintenance medications.  Follow up plan: Return in 4 weeks (on 01/13/2022), or if symptoms worsen or fail to improve, for DM, labs, depression.   Continue healthy lifestyle choices, including diet (rich in fruits, vegetables, and lean proteins, and low in salt and simple carbohydrates) and exercise (at least 30 minutes of moderate physical activity  daily).   The above assessment and management plan was discussed with the patient. The patient verbalized understanding of and has agreed to the management plan. Patient is aware to call the clinic if they develop any new symptoms or if symptoms persist or worsen. Patient is aware when to return to the clinic for a follow-up visit. Patient educated on when it is appropriate to go to the emergency department.   Monia Pouch, FNP-C [facility] [phone]

## 2021-12-16 NOTE — Telephone Encounter (Signed)
Kenzie Civil (Key: [id number]) Rx #: [id number] Vraylar 1.5MG  capsules   Form [facility] Electronic [place] Form [phone] NCPDP) Created 40 minutes ago Sent to Plan 28 minutes ago Plan Response 28 minutes ago Submit Clinical Questions less than a minute ago Determination Favorable less than a minute ago Message from Plan PA Case: [id number], Status: Approved, Coverage Starts on: 12/16/2021 12:00:00 AM, Coverage Ends on: 12/16/2022 12:00:00 AM.  Pharmacy informed

## 2021-12-20 ENCOUNTER — Ambulatory Visit: Payer: Medicaid Other | Admitting: Family Medicine

## 2021-12-22 ENCOUNTER — Encounter: Payer: Self-pay | Admitting: Family Medicine

## 2021-12-28 ENCOUNTER — Encounter: Payer: Self-pay | Admitting: Family Medicine

## 2021-12-29 ENCOUNTER — Encounter: Payer: Self-pay | Admitting: Family Medicine

## 2021-12-30 ENCOUNTER — Other Ambulatory Visit: Payer: Self-pay | Admitting: Family Medicine

## 2021-12-30 DIAGNOSIS — F411 Generalized anxiety disorder: Secondary | ICD-10-CM

## 2021-12-30 DIAGNOSIS — F339 Major depressive disorder, recurrent, unspecified: Secondary | ICD-10-CM

## 2022-01-13 ENCOUNTER — Ambulatory Visit: Payer: Medicaid Other | Admitting: Family Medicine

## 2022-01-13 ENCOUNTER — Encounter: Payer: Self-pay | Admitting: Family Medicine

## 2022-01-13 VITALS — BP 119/80 | HR 72 | Temp 98.8°F | Ht 68.0 in | Wt 268.0 lb

## 2022-01-13 DIAGNOSIS — F411 Generalized anxiety disorder: Secondary | ICD-10-CM

## 2022-01-13 DIAGNOSIS — E119 Type 2 diabetes mellitus without complications: Secondary | ICD-10-CM | POA: Diagnosis not present

## 2022-01-13 DIAGNOSIS — I152 Hypertension secondary to endocrine disorders: Secondary | ICD-10-CM | POA: Diagnosis not present

## 2022-01-13 DIAGNOSIS — R400 Somnolence: Secondary | ICD-10-CM

## 2022-01-13 DIAGNOSIS — E1159 Type 2 diabetes mellitus with other circulatory complications: Secondary | ICD-10-CM | POA: Diagnosis not present

## 2022-01-13 DIAGNOSIS — F339 Major depressive disorder, recurrent, unspecified: Secondary | ICD-10-CM | POA: Diagnosis not present

## 2022-01-13 LAB — BAYER DCA HB A1C WAIVED: HB A1C (BAYER DCA - WAIVED): 5.3 % (ref 4.8–5.6)

## 2022-01-13 MED ORDER — TIRZEPATIDE 10 MG/0.5ML ~~LOC~~ SOAJ: 10.0000 mg | SUBCUTANEOUS | 1 refills | Status: DC

## 2022-01-13 NOTE — Patient Instructions (Signed)

## 2022-01-13 NOTE — Progress Notes (Signed)
Subjective:  Patient ID: Cassandra Tucker, female    DOB: 24-Sep-1990, 31 y.o.   MRN: [id number]  Patient Care Team: Baruch Gouty, FNP as PCP - General (Family Medicine)   Chief Complaint:  Medical Management of Chronic Issues   HPI: Cassandra Tucker is a 31 y.o. female presenting on 01/13/2022 for Medical Management of Chronic Issues   1. Controlled type 2 diabetes mellitus without complication, without long-term current use of insulin ([place]) Has been on Mounjaro and tolerating well. Blood sugars logs look great, lowest reading 78, highest 145. She denies hypo- or hyperglycemic symptoms.   2. Hypertension associated with type 2 diabetes mellitus ([place]) On Zestoretic and tolerating well. No chest pain, shortness of breath, leg swelling, cough, dizziness, headaches, palpitations, confusion, or werakness.   3. Morbid obesity ([place]) Has been doing very well on Mounjaro. Has lost 2 lbs since last visit.    4. GAD (generalized anxiety disorder) 5. Depression, recurrent ([place]) Has stopped wellbutrin and started Vraylar, states she does feel a lot better but feels sill room for improvement. No SI or HI.     01/13/2022    2:14 PM 12/16/2021    9:19 AM 09/22/2021    9:09 AM 09/20/2021   10:37 AM  GAD 7 : Generalized Anxiety Score  Nervous, Anxious, on Edge '3 3 3 3  ' Control/stop worrying '2 2 3 3  ' Worry too much - different things '2 3 3 3  ' Trouble relaxing '2 3 3 3  ' Restless '2 3 3 3  ' Easily annoyed or irritable '3 3 3 3  ' Afraid - awful might happen 0 0 0 1  Total GAD 7 Score '14 17 18 19  ' Anxiety Difficulty Extremely difficult Extremely difficult Somewhat difficult Somewhat difficult       01/13/2022    2:13 PM 12/16/2021    9:19 AM 09/22/2021    9:09 AM 09/20/2021   10:36 AM 08/02/2021   10:38 AM  Depression screen PHQ 2/9  Decreased Interest '2 3 2 1 2  ' Down, Depressed, Hopeless '2 2 2 1 2  ' PHQ - 2 Score '4 5 4 2 4  ' Altered sleeping '3 3 2 3 1  ' Tired, decreased energy '3 3 2 2 3  ' Change  in appetite '3 3 2 ' 0 3  Feeling bad or failure about yourself  0 0 0 0 0  Trouble concentrating '3 1 2 2 3  ' Moving slowly or fidgety/restless '3 1 1 2 2  ' Suicidal thoughts 0 0 0 0 0  PHQ-9 Score '19 16 13 11 16  ' Difficult doing work/chores Extremely dIfficult Extremely dIfficult Somewhat difficult Somewhat difficult      6. Daytime sleepiness Was referred for sleep study but has not received appointment.      Relevant past medical, surgical, family, and social history reviewed and updated as indicated.  Allergies and medications reviewed and updated. Data reviewed: Chart in Epic.   Past Medical History:  Diagnosis Date   Ankle fracture, left 2000   Anxiety    Arthritis    left wrist and left ankle   Asthma    Back pain    Back pain    Bronchitis    Depression    Diabetes ([place]) 01/11/2016   type II  history of   Family history of adverse reaction to anesthesia    gmother had problems with N/V    GERD (gastroesophageal reflux disease)    Headache    High  cholesterol    History of kidney stones    HLD (hyperlipidemia)    HSV-2 infection    Hypertension 2013   Intracranial hypertension    psuedotumor cebrum   Joint pain    Joint pain    Kienbock's disease    Leg edema    OSA (obstructive sleep apnea)    cpap - does not know settings    Papilledema    Prediabetes    Pregnancy induced hypertension    Pseudotumor cerebri syndrome    RLS (restless legs syndrome)    Sinus tachycardia    SOB (shortness of breath)     Past Surgical History:  Procedure Laterality Date   CESAREAN SECTION N/A 07/17/2019   Procedure: CESAREAN SECTION;  Surgeon: Everett Graff, MD;  Location: [facility];  Service: Obstetrics;  Laterality: N/A;   FRACTURE SURGERY  2005   ankle   FRACTURE SURGERY  2013   wrist (deteriorating bone)   LAPAROSCOPIC GASTRIC SLEEVE RESECTION N/A 05/08/2017   Procedure: LAPAROSCOPIC GASTRIC SLEEVE RESECTION;  Surgeon: Clovis Riley, MD;  Location: [facility];   Service: General;  Laterality: N/A;   LAPAROSCOPIC GASTRIC SLEEVE RESECTION  05/08/2017   UPPER GI ENDOSCOPY  05/08/2017   Procedure: UPPER GI ENDOSCOPY;  Surgeon: Clovis Riley, MD;  Location: [facility];  Service: General;;   urethra stretch     WRIST SURGERY  2015    Social History   Socioeconomic History   Marital status: Married    Spouse name: Cecilie Kicks   Number of children: 0   Years of education: 12+   Highest education level: Not on file  Occupational History   Occupation: Solicitor: [employer]  Tobacco Use   Smoking status: Every Day    Packs/day: 1.00    Types: Cigarettes    Last attempt to quit: 10/05/2018    Years since quitting: 3.2   Smokeless tobacco: Never  Vaping Use   Vaping Use: Never used  Substance and Sexual Activity   Alcohol use: Not Currently   Drug use: Not Currently    Types: Marijuana   Sexual activity: Yes    Birth control/protection: I.U.D.    Comment: condoms previously  Other Topics Concern   Not on file  Social History Narrative   Lives with grandparents   Caffeine use: Drinks coffee/tea/soda- 20oz per day (none after starting diamox)   Social Determinants of Health   Financial Resource Strain: Not on file  Food Insecurity: Not on file  Transportation Needs: Not on file  Physical Activity: Not on file  Stress: Not on file  Social Connections: Not on file  Intimate Partner Violence: Not on file    Outpatient Encounter Medications as of 01/13/2022  Medication Sig   ACCU-CHEK GUIDE test strip USE UP TO FOUR TIMES DAILY AS DIRECTED E10.9, E11.9   Accu-Chek Softclix Lancets lancets USE UP TO FOUR TIMES DAILY AS DIRECTED E10.9, E11.9   albuterol (PROVENTIL) (2.5 MG/3ML) 0.083% nebulizer solution Take 3 mLs (2.5 mg total) by nebulization every 4 (four) hours as needed for wheezing or shortness of breath.   albuterol (VENTOLIN HFA) 108 (90 Base) MCG/ACT inhaler Inhale 2 puffs into the lungs every 4 (four) hours as needed for wheezing  or shortness of breath (coughing fits).   Benralizumab (FASENRA PEN) 30 MG/ML SOAJ Inject 1 mL (30 mg total) into the skin every 8 (eight) weeks.   blood glucose meter kit and supplies Dispense based on patient and  insurance preference. Use up to four times daily as directed. (FOR ICD-10 E10.9, E11.9).   cariprazine (VRAYLAR) 1.5 MG capsule Take 1 capsule (1.5 mg total) by mouth daily.   FASENRA PEN 30 MG/ML SOAJ INJECT 1 PEN UNDER THE SKIN EVERY 8 WEEKS.   FLUoxetine (PROZAC) 20 MG capsule Take 1 capsule (20 mg total) by mouth daily.   FLUoxetine (PROZAC) 40 MG capsule TAKE 1 CAPSULE (40 MG TOTAL) BY MOUTH DAILY. TAKE WITH 20 MG (TOTAL OF 60 MG DAILY)   fluticasone (FLONASE) 50 MCG/ACT nasal spray Place 1 spray into both nostrils 2 (two) times daily as needed (nasal congestion).   levocetirizine (XYZAL) 5 MG tablet Take 1 tablet (5 mg total) by mouth every evening.   levonorgestrel (MIRENA, 52 MG,) 20 MCG/24HR IUD Mirena 20 mcg/24 hours (6 yrs) 52 mg intrauterine device  Take 1 device by intrauterine route.   lisinopril-hydrochlorothiazide (ZESTORETIC) 20-25 MG tablet Take 1 tablet by mouth daily.   NYSTATIN powder Apply topically 2 (two) times daily.   pravastatin (PRAVACHOL) 40 MG tablet Take 1 tablet (40 mg total) by mouth daily.   [DISCONTINUED] tirzepatide (MOUNJARO) 10 MG/0.5ML Pen Inject 10 mg into the skin once a week.   famotidine (PEPCID) 20 MG tablet Take 1 tablet (20 mg total) by mouth 2 (two) times daily.   NIFEdipine (ADALAT CC) 60 MG 24 hr tablet Take 60 mg by mouth every morning.   tirzepatide (MOUNJARO) 10 MG/0.5ML Pen Inject 10 mg into the skin once a week.   [DISCONTINUED] buPROPion (WELLBUTRIN XL) 150 MG 24 hr tablet TAKE 1 TABLET BY MOUTH EVERY DAY   [DISCONTINUED] cetirizine (ZYRTEC) 10 MG tablet Take 10 mg by mouth daily.   No facility-administered encounter medications on file as of 01/13/2022.    Allergies  Allergen Reactions   Cats Claw (Uncaria Tomentosa) Itching,  Shortness Of Breath and Swelling   Dust Mite Extract Itching and Shortness Of Breath   Grass Pollen(K-O-R-T-Swt Vern) Itching and Shortness Of Breath   Mixed Feathers Itching, Shortness Of Breath and Swelling    Review of Systems  Constitutional:  Positive for activity change, appetite change and fatigue. Negative for chills, diaphoresis, fever and unexpected weight change.  Eyes:  Negative for photophobia and visual disturbance.  Respiratory:  Negative for cough.   Cardiovascular:  Negative for chest pain, palpitations and leg swelling.  Gastrointestinal:  Negative for abdominal pain, diarrhea, nausea and vomiting.  Genitourinary:  Negative for decreased urine volume and difficulty urinating.  Neurological:  Negative for dizziness, tremors, seizures, syncope, facial asymmetry, speech difficulty, weakness, light-headedness, numbness and headaches.  Psychiatric/Behavioral:  Positive for agitation, decreased concentration and sleep disturbance. Negative for behavioral problems, confusion, dysphoric mood, hallucinations and self-injury. The patient is nervous/anxious. The patient is not hyperactive.   All other systems reviewed and are negative.       Objective:  BP 119/80   Pulse 72   Temp 98.8 F (37.1 C)   Ht '5\' 8"'  (1.727 m)   Wt 268 lb (121.6 kg)   SpO2 99%   BMI 40.75 kg/m    Wt Readings from Last 3 Encounters:  01/13/22 268 lb (121.6 kg)  12/16/21 270 lb (122.5 kg)  09/22/21 293 lb 9.6 oz (133.2 kg)    Physical Exam Vitals and nursing note reviewed.  Constitutional:      General: She is not in acute distress.    Appearance: Normal appearance. She is obese. She is not ill-appearing, toxic-appearing or diaphoretic.  HENT:  Head: Normocephalic and atraumatic.     Mouth/Throat:     Mouth: Mucous membranes are moist.  Eyes:     Pupils: Pupils are equal, round, and reactive to light.  Cardiovascular:     Rate and Rhythm: Normal rate and regular rhythm.     Heart  sounds: Normal heart sounds.  Pulmonary:     Effort: Pulmonary effort is normal.     Breath sounds: Normal breath sounds.  Musculoskeletal:     Right lower leg: No edema.     Left lower leg: No edema.  Skin:    General: Skin is warm and dry.     Capillary Refill: Capillary refill takes less than 2 seconds.  Neurological:     General: No focal deficit present.     Mental Status: She is alert and oriented to person, place, and time.  Psychiatric:        Mood and Affect: Mood normal.        Behavior: Behavior normal.        Thought Content: Thought content normal.        Judgment: Judgment normal.     Results for orders placed or performed in visit on 09/20/21  CBC with Differential/Platelet  Result Value Ref Range   WBC 6.9 3.4 - 10.8 x10E3/uL   RBC 4.95 3.77 - 5.28 x10E6/uL   Hemoglobin 13.0 11.1 - 15.9 g/dL   Hematocrit 40.4 34.0 - 46.6 %   MCV 82 79 - 97 fL   MCH 26.3 (L) 26.6 - 33.0 pg   MCHC 32.2 31.5 - 35.7 g/dL   RDW 13.7 11.7 - 15.4 %   Platelets 262 150 - 450 x10E3/uL   Neutrophils 55 Not Estab. %   Lymphs 40 Not Estab. %   Monocytes 5 Not Estab. %   Eos 0 Not Estab. %   Basos 0 Not Estab. %   Neutrophils Absolute 3.8 1.4 - 7.0 x10E3/uL   Lymphocytes Absolute 2.7 0.7 - 3.1 x10E3/uL   Monocytes Absolute 0.4 0.1 - 0.9 x10E3/uL   EOS (ABSOLUTE) 0.0 0.0 - 0.4 x10E3/uL   Basophils Absolute 0.0 0.0 - 0.2 x10E3/uL   Immature Granulocytes 0 Not Estab. %   Immature Grans (Abs) 0.0 0.0 - 0.1 x10E3/uL  CMP14+EGFR  Result Value Ref Range   Glucose 64 (L) 70 - 99 mg/dL   BUN 10 6 - 20 mg/dL   Creatinine, Ser 0.68 0.57 - 1.00 mg/dL   eGFR 121 >59 mL/min/1.73   BUN/Creatinine Ratio 15 9 - 23   Sodium 142 134 - 144 mmol/L   Potassium 4.2 3.5 - 5.2 mmol/L   Chloride 104 96 - 106 mmol/L   CO2 26 20 - 29 mmol/L   Calcium 9.6 8.7 - 10.2 mg/dL   Total Protein 6.7 6.0 - 8.5 g/dL   Albumin 4.3 3.9 - 5.0 g/dL   Globulin, Total 2.4 1.5 - 4.5 g/dL   Albumin/Globulin Ratio 1.8  1.2 - 2.2   Bilirubin Total 0.6 0.0 - 1.2 mg/dL   Alkaline Phosphatase 74 44 - 121 IU/L   AST 10 0 - 40 IU/L   ALT 9 0 - 32 IU/L  Lipid panel  Result Value Ref Range   Cholesterol, Total 197 100 - 199 mg/dL   Triglycerides 170 (H) 0 - 149 mg/dL   HDL 35 (L) >39 mg/dL   VLDL Cholesterol Cal 31 5 - 40 mg/dL   LDL Chol Calc (NIH) 131 (H) 0 - 99 mg/dL  Chol/HDL Ratio 5.6 (H) 0.0 - 4.4 ratio  Thyroid Panel With TSH  Result Value Ref Range   TSH 1.180 0.450 - 4.500 uIU/mL   T4, Total 9.5 4.5 - 12.0 ug/dL   T3 Uptake Ratio 28 24 - 39 %   Free Thyroxine Index 2.7 1.2 - 4.9  Cytology - PAP(English)  Result Value Ref Range   High risk HPV Negative    Neisseria Gonorrhea Negative    Chlamydia Negative    Trichomonas Negative    HSV1 Negative    HSV2 Negative    Adequacy      Satisfactory for evaluation; transformation zone component ABSENT.   Diagnosis      - Negative for intraepithelial lesion or malignancy (NILM)   Comment Normal Reference Range HPV - Negative    Comment Normal Reference Range Trichomonas - Negative    Comment Normal Reference [place] Chlamydia - Negative    Comment      Normal Reference Range Neisseria Gonorrhea - Negative   Comment Normal Reference Range HSV - Negative        Pertinent labs & imaging results that were available during my care of the patient were reviewed by me and considered in my medical decision making.  Assessment & Plan:  L[place]ia was seen today for medical management of chronic issues.  Diagnoses and all orders for this visit:  Controlled type 2 diabetes mellitus without complication, without long-term current use of insulin (HCC) A1C 5.3. doing well on Mounjaro, will continue. Diet and exercise encouraged.  -     Bayer DCA Hb A1c Waived -     tirzepatide (MOUNJARO) 10 MG/0.5ML Pen; Inject 10 mg into the skin once a week.  Hypertension associated with type 2 diabetes mellitus (HCC) BP well controlled. Changes were not made in  regimen today. Goal BP is 130/80. Pt aware to report any persistent high or low readings. DASH diet and exercise encouraged. Exercise at least 150 minutes per week and increase as tolerated. Goal BMI > 25. Stress management encouraged. Avoid nicotine and tobacco product use. Avoid excessive alcohol and NSAID's. Avoid more than 2000 mg of sodium daily. Medications as prescribed. Follow up as scheduled.   Morbid obesity ([place]) Doing well on Mounjaro. Diet and exercise encouraged.   GAD (generalized anxiety disorder) Depression, recurrent (HCC) Still symptomatic. Will try increasing Vraylar to 3 mg. If this is beneficial, pt aware to call office so new dosing can be sent to pharmacy. Report new, worsening, or persistent symptoms.   Daytime sleepiness Has been referred for sleep study but has not received an appointment, will check on this today with referrals coordinator.     Continue all other maintenance medications.  Follow up plan: Return in about 3 months (around 04/15/2022) for DM.   Continue healthy lifestyle choices, including diet (rich in fruits, vegetables, and lean proteins, and low in salt and simple carbohydrates) and exercise (at least 30 minutes of moderate physical activity daily).  Educational handout given for DM  The above assessment and management plan was discussed with the patient. The patient verbalized understanding of and has agreed to the management plan. Patient is aware to call the clinic if they develop any new symptoms or if symptoms persist or worsen. Patient is aware when to return to the clinic for a follow-up visit. Patient educated on when it is appropriate to go to the emergency department.   Monia Pouch, FNP-C [facility] [phone]

## 2022-01-18 ENCOUNTER — Encounter (INDEPENDENT_AMBULATORY_CARE_PROVIDER_SITE_OTHER): Payer: Self-pay

## 2022-01-26 ENCOUNTER — Other Ambulatory Visit: Payer: Self-pay | Admitting: Family Medicine

## 2022-01-26 ENCOUNTER — Encounter: Payer: Self-pay | Admitting: Family Medicine

## 2022-01-26 DIAGNOSIS — F339 Major depressive disorder, recurrent, unspecified: Secondary | ICD-10-CM

## 2022-01-26 DIAGNOSIS — F411 Generalized anxiety disorder: Secondary | ICD-10-CM

## 2022-01-26 MED ORDER — CARIPRAZINE HCL 3 MG PO CAPS: 3.0000 mg | ORAL_CAPSULE | Freq: Every day | ORAL | 6 refills | Status: DC

## 2022-01-31 DIAGNOSIS — R0683 Snoring: Secondary | ICD-10-CM | POA: Diagnosis not present

## 2022-01-31 DIAGNOSIS — R0681 Apnea, not elsewhere classified: Secondary | ICD-10-CM | POA: Diagnosis not present

## 2022-01-31 DIAGNOSIS — I10 Essential (primary) hypertension: Secondary | ICD-10-CM | POA: Diagnosis not present

## 2022-01-31 DIAGNOSIS — G932 Benign intracranial hypertension: Secondary | ICD-10-CM | POA: Diagnosis not present

## 2022-01-31 DIAGNOSIS — R000 Tachycardia, unspecified: Secondary | ICD-10-CM | POA: Diagnosis not present

## 2022-02-02 ENCOUNTER — Encounter: Payer: Self-pay | Admitting: Family Medicine

## 2022-02-02 ENCOUNTER — Other Ambulatory Visit: Payer: Self-pay | Admitting: Family Medicine

## 2022-02-02 DIAGNOSIS — L987 Excessive and redundant skin and subcutaneous tissue: Secondary | ICD-10-CM

## 2022-02-21 DIAGNOSIS — F411 Generalized anxiety disorder: Secondary | ICD-10-CM | POA: Diagnosis not present

## 2022-02-21 DIAGNOSIS — F331 Major depressive disorder, recurrent, moderate: Secondary | ICD-10-CM | POA: Diagnosis not present

## 2022-02-21 DIAGNOSIS — F6381 Intermittent explosive disorder: Secondary | ICD-10-CM | POA: Diagnosis not present

## 2022-02-22 ENCOUNTER — Encounter: Payer: Self-pay | Admitting: Family Medicine

## 2022-03-03 ENCOUNTER — Other Ambulatory Visit: Payer: Self-pay | Admitting: Allergy

## 2022-03-12 ENCOUNTER — Encounter: Payer: Self-pay | Admitting: Family Medicine

## 2022-03-16 ENCOUNTER — Telehealth: Payer: Self-pay | Admitting: Family Medicine

## 2022-03-16 ENCOUNTER — Other Ambulatory Visit: Payer: Self-pay | Admitting: Family Medicine

## 2022-03-16 DIAGNOSIS — E119 Type 2 diabetes mellitus without complications: Secondary | ICD-10-CM

## 2022-03-16 MED ORDER — TRULICITY 3 MG/0.5ML ~~LOC~~ SOAJ: 3.0000 mg | SUBCUTANEOUS | 6 refills | Status: DC

## 2022-03-16 NOTE — Telephone Encounter (Signed)
BYETTA 5 MCG PEN 5 MCG/0.02ML SOPN injection        Changed from: tirzepatide (MOUNJARO) 10 MG/0.5ML Pen   Pharmacy comment: Alternative Requested:AETNA REQUIRES PRIOR AUTHORIZATION AND MEDICAID WONT PAY UNLESS AETNA APPROVES TOO; PLEASE CONTACT INSURANCE OR CONSIDER ALTERNATIVE.   All Pharmacy Suggested Alternatives:   exenatide (BYETTA 5 MCG PEN) 5 MCG/0.02ML SOPN injection liraglutide (VICTOZA) 18 MG/3ML SOPN Dulaglutide (TRULICITY) 5.00 BB/0.4UG SOPN Semaglutide,0.25 or 0.5MG /DOS, (OZEMPIC, 0.25 OR 0.5 MG/DOSE,) 2 MG/3ML SOPN   Sent to both PCP & PA pool

## 2022-03-21 ENCOUNTER — Encounter: Payer: Self-pay | Admitting: Family Medicine

## 2022-03-21 DIAGNOSIS — F331 Major depressive disorder, recurrent, moderate: Secondary | ICD-10-CM | POA: Diagnosis not present

## 2022-03-21 DIAGNOSIS — R69 Illness, unspecified: Secondary | ICD-10-CM | POA: Diagnosis not present

## 2022-03-21 DIAGNOSIS — F6381 Intermittent explosive disorder: Secondary | ICD-10-CM | POA: Diagnosis not present

## 2022-03-21 DIAGNOSIS — F411 Generalized anxiety disorder: Secondary | ICD-10-CM | POA: Diagnosis not present

## 2022-03-27 ENCOUNTER — Telehealth: Payer: Self-pay

## 2022-03-27 DIAGNOSIS — H5213 Myopia, bilateral: Secondary | ICD-10-CM | POA: Diagnosis not present

## 2022-03-27 NOTE — Telephone Encounter (Signed)
PA has been submitted   Tamalyn Teffeteller Key: [id number] - Rx #: [id number]  Pending response

## 2022-03-29 NOTE — Telephone Encounter (Signed)
request for coverage of TRULICITY INJ 0/6.0    We've denied the request for the following reason(s):  You do not meet the requirements of your plan. Your plan covers this drug when you meet any of these conditions: - You tried the alternate drug(s) while covered by the current or the previous health benefit plan - The alternate drug(s) caused or is reasonably expected to cause an adverse reaction for you - You tried the alternate drug(s) and it did not work for you Your request has been denied based on the information we have.

## 2022-03-30 NOTE — Telephone Encounter (Signed)
PA FILLED OUT INCORRECTLY SHE HAS TRIED FAILED VICTOZA  I got APPROVED  Please let patient and pharmacy know CC'D PCP just as an [place]

## 2022-03-30 NOTE — Telephone Encounter (Signed)
Left detailed message on [facility] Attempted to contact patient - NA

## 2022-04-11 ENCOUNTER — Encounter: Payer: Self-pay | Admitting: Plastic Surgery

## 2022-04-11 ENCOUNTER — Ambulatory Visit (INDEPENDENT_AMBULATORY_CARE_PROVIDER_SITE_OTHER): Payer: Medicaid Other | Admitting: Plastic Surgery

## 2022-04-11 VITALS — BP 147/88 | HR 77 | Ht 67.0 in | Wt 267.0 lb

## 2022-04-11 DIAGNOSIS — G932 Benign intracranial hypertension: Secondary | ICD-10-CM

## 2022-04-11 DIAGNOSIS — Z6841 Body Mass Index (BMI) 40.0 and over, adult: Secondary | ICD-10-CM | POA: Diagnosis not present

## 2022-04-11 DIAGNOSIS — M793 Panniculitis, unspecified: Secondary | ICD-10-CM

## 2022-04-11 DIAGNOSIS — Z8659 Personal history of other mental and behavioral disorders: Secondary | ICD-10-CM

## 2022-04-11 DIAGNOSIS — M549 Dorsalgia, unspecified: Secondary | ICD-10-CM | POA: Insufficient documentation

## 2022-04-11 DIAGNOSIS — M546 Pain in thoracic spine: Secondary | ICD-10-CM | POA: Diagnosis not present

## 2022-04-11 DIAGNOSIS — Z9884 Bariatric surgery status: Secondary | ICD-10-CM

## 2022-04-11 DIAGNOSIS — R21 Rash and other nonspecific skin eruption: Secondary | ICD-10-CM

## 2022-04-11 DIAGNOSIS — G8929 Other chronic pain: Secondary | ICD-10-CM

## 2022-04-11 NOTE — Addendum Note (Signed)
Addended by: Lindon Romp on: 04/11/2022 01:38 PM   Modules accepted: Orders

## 2022-04-11 NOTE — Progress Notes (Signed)
Patient ID: Cassandra Tucker, female    DOB: [birth year], 31 y.o.   MRN: [id number]   Chief Complaint  Patient presents with   Advice Only   Skin Problem    The patient is a 31 year old female here for evaluation of her abdomen.  She underwent a gastric sleeve by Dr. Windle Guard in 2017.  She is borderline diabetic and her hemoglobin A1c has improved since her surgery.  It was last checked in August and was 5.3.  She is not on any blood thinners.  She has back pain neck pain and rashes.  Her weight loss was from 410 pounds to 267 pounds.  She is 5 feet 7 inches tall.  Her BMI is 41.8 kg/m.  She has depression, hypertension, hyperlipidemia and diabetes.  That has all improved since her weight loss.  Surgically she has had the bypass surgery, wrist surgery and a C-section.  She has 2 falls that are creating the pannus.  The upper fold would not be addressed in the panniculectomy.  She has rashes and skin breakdown in all of the areas.  I am unable to appreciate a hernia but cannot say with confidence she does not have one.    Review of Systems  Constitutional: Negative.   Eyes: Negative.   Respiratory: Negative.  Negative for chest tightness and shortness of breath.   Cardiovascular:  Positive for leg swelling.  Gastrointestinal: Negative.   Endocrine: Negative.   Genitourinary: Negative.   Musculoskeletal:  Positive for back pain and neck pain.  Skin:  Positive for rash.  Hematological: Negative.   Psychiatric/Behavioral: Negative.      Past Medical History:  Diagnosis Date   Ankle fracture, left 2000   Anxiety    Arthritis    left wrist and left ankle   Asthma    Back pain    Back pain    Bronchitis    Depression    Diabetes ([place]) 01/11/2016   type II  history of   Family history of adverse reaction to anesthesia    gmother had problems with N/V    GERD (gastroesophageal reflux disease)    Headache    High cholesterol    History of kidney stones    HLD (hyperlipidemia)     HSV-2 infection    Hypertension 2013   Intracranial hypertension    psuedotumor cebrum   Joint pain    Joint pain    Kienbock's disease    Leg edema    OSA (obstructive sleep apnea)    cpap - does not know settings    Papilledema    Prediabetes    Pregnancy induced hypertension    Pseudotumor cerebri syndrome    RLS (restless legs syndrome)    Sinus tachycardia    SOB (shortness of breath)     Past Surgical History:  Procedure Laterality Date   CESAREAN SECTION N/A 07/17/2019   Procedure: CESAREAN SECTION;  Surgeon: Everett Graff, MD;  Location: [facility];  Service: Obstetrics;  Laterality: N/A;   FRACTURE SURGERY  2005   ankle   FRACTURE SURGERY  2013   wrist (deteriorating bone)   LAPAROSCOPIC GASTRIC SLEEVE RESECTION N/A 05/08/2017   Procedure: LAPAROSCOPIC GASTRIC SLEEVE RESECTION;  Surgeon: Clovis Riley, MD;  Location: [facility];  Service: General;  Laterality: N/A;   LAPAROSCOPIC GASTRIC SLEEVE RESECTION  05/08/2017   UPPER GI ENDOSCOPY  05/08/2017   Procedure: UPPER GI ENDOSCOPY;  Surgeon: Clovis Riley, MD;  Location: [facility];  Service: General;;   urethra stretch     WRIST SURGERY  2015      Current Outpatient Medications:    ACCU-CHEK GUIDE test strip, USE UP TO FOUR TIMES DAILY AS DIRECTED E10.9, E11.9, Disp: 400 strip, Rfl: 3   Accu-Chek Softclix Lancets lancets, USE UP TO FOUR TIMES DAILY AS DIRECTED E10.9, E11.9, Disp: 400 each, Rfl: 3   albuterol (PROVENTIL) (2.5 MG/3ML) 0.083% nebulizer solution, Take 3 mLs (2.5 mg total) by nebulization every 4 (four) hours as needed for wheezing or shortness of breath., Disp: 75 mL, Rfl: 1   Benralizumab (FASENRA PEN) 30 MG/ML SOAJ, Inject 1 mL (30 mg total) into the skin every 8 (eight) weeks., Disp: 1 mL, Rfl: 6   blood glucose meter kit and supplies, Dispense based on patient and insurance preference. Use up to four times daily as directed. (FOR ICD-10 E10.9, E11.9)., Disp: 1 each, Rfl: 0   cariprazine (VRAYLAR) 3  MG capsule, Take 1 capsule (3 mg total) by mouth daily., Disp: 30 capsule, Rfl: 6   Dulaglutide (TRULICITY) 3 SU/1.1SR SOPN, Inject 3 mg as directed once a week., Disp: 0.5 mL, Rfl: 6   famotidine (PEPCID) 20 MG tablet, Take 1 tablet (20 mg total) by mouth 2 (two) times daily., Disp: 180 tablet, Rfl: 3   FASENRA PEN 30 MG/ML SOAJ, INJECT 1 PEN UNDER THE SKIN EVERY 8 WEEKS., Disp: 1 mL, Rfl: 6   FLUoxetine (PROZAC) 20 MG capsule, Take 1 capsule (20 mg total) by mouth daily., Disp: 30 capsule, Rfl: 3   FLUoxetine (PROZAC) 40 MG capsule, TAKE 1 CAPSULE (40 MG TOTAL) BY MOUTH DAILY. TAKE WITH 20 MG (TOTAL OF 60 MG DAILY), Disp: 90 capsule, Rfl: 0   fluticasone (FLONASE) 50 MCG/ACT nasal spray, Place 1 spray into both nostrils 2 (two) times daily as needed (nasal congestion)., Disp: 16 g, Rfl: 5   levocetirizine (XYZAL) 5 MG tablet, Take 1 tablet (5 mg total) by mouth every evening., Disp: 30 tablet, Rfl: 5   levonorgestrel (MIRENA, 52 MG,) 20 MCG/24HR IUD, Mirena 20 mcg/24 hours (6 yrs) 52 mg intrauterine device  Take 1 device by intrauterine route., Disp: , Rfl:    lisinopril-hydrochlorothiazide (ZESTORETIC) 20-25 MG tablet, Take 1 tablet by mouth daily., Disp: 90 tablet, Rfl: 3   NIFEdipine (ADALAT CC) 60 MG 24 hr tablet, Take 60 mg by mouth every morning., Disp: , Rfl:    NYSTATIN powder, Apply topically 2 (two) times daily., Disp: , Rfl:    pravastatin (PRAVACHOL) 40 MG tablet, Take 1 tablet (40 mg total) by mouth daily., Disp: 90 tablet, Rfl: 3   VENTOLIN HFA 108 (90 Base) MCG/ACT inhaler, INHALE 2 PUFFS BY MOUTH EVERY 6 HOURS AS NEEDED FOR WHEEZING OR SHORTNESS OF BREATH, Disp: 54 each, Rfl: 1   Objective:   Vitals:   04/11/22 1039  BP: (!) 147/88  Pulse: 77  SpO2: 99%    Physical Exam Vitals and nursing note reviewed.  Constitutional:      Appearance: Normal appearance.  HENT:     Head: Normocephalic and atraumatic.  Cardiovascular:     Rate and Rhythm: Normal rate.     Pulses:  Normal pulses.  Abdominal:     General: There is no distension.     Palpations: Abdomen is soft.  Musculoskeletal:        General: No swelling or deformity.  Skin:    General: Skin is warm.     Capillary Refill: Capillary  refill takes less than 2 seconds.     Coloration: Skin is not jaundiced.     Findings: Erythema, lesion and rash present. No bruising.  Neurological:     Mental Status: She is alert and oriented to person, place, and time.  Psychiatric:        Mood and Affect: Mood normal.        Thought Content: Thought content normal.        Judgment: Judgment normal.     Assessment & Plan:  Pseudotumor cerebri syndrome  Chronic bilateral thoracic back pain  Panniculitis  Morbid obesity (HCC)  History of depression  The patient is a candidate for a panniculectomy.  We did discuss the difference between a panniculectomy and an abdominoplasty.  The patient is going through a change in her insurance.  Physical therapy is recommended.  We will do it virtual visit in November once she has switched her insurance.  We will then be able to continue discussion on submission for surgery and submission for physical therapy.  Patient is in agreement with this plan.  Pictures were obtained of the patient and placed in the chart with the patient's or guardian's permission.   [place], DO

## 2022-04-13 ENCOUNTER — Ambulatory Visit: Payer: Medicaid Other | Admitting: Family Medicine

## 2022-04-13 ENCOUNTER — Encounter: Payer: Self-pay | Admitting: Family Medicine

## 2022-04-24 ENCOUNTER — Encounter: Payer: Self-pay | Admitting: Plastic Surgery

## 2022-04-25 NOTE — Addendum Note (Signed)
Addended by: Peggye Form on: 04/25/2022 02:48 PM   Modules accepted: Orders

## 2022-05-02 ENCOUNTER — Ambulatory Visit (INDEPENDENT_AMBULATORY_CARE_PROVIDER_SITE_OTHER): Payer: Medicaid Other | Admitting: Plastic Surgery

## 2022-05-02 ENCOUNTER — Encounter: Payer: Self-pay | Admitting: Plastic Surgery

## 2022-05-02 ENCOUNTER — Ambulatory Visit: Payer: Medicaid Other

## 2022-05-02 DIAGNOSIS — E119 Type 2 diabetes mellitus without complications: Secondary | ICD-10-CM

## 2022-05-02 DIAGNOSIS — M793 Panniculitis, unspecified: Secondary | ICD-10-CM | POA: Diagnosis not present

## 2022-05-02 DIAGNOSIS — Z9884 Bariatric surgery status: Secondary | ICD-10-CM | POA: Diagnosis not present

## 2022-05-02 DIAGNOSIS — G8929 Other chronic pain: Secondary | ICD-10-CM

## 2022-05-02 NOTE — Progress Notes (Signed)
   Subjective:    Patient ID: Cassandra Tucker, female    DOB: [birth year], 31 y.o.   MRN: [id number]  The patient is a 31 year old female joining me by phone for further discussion about her abdomen.  She has signed up for physical therapy and should be starting that in the next 1 to 2 weeks.  She had a gastric sleeve by Dr. Doylene Canard in 2017 and is done really well in decreasing her weight.  She is 5 feet 7 inches tall and weighs 267 pounds her past medical history includes depression, hypertension, hyperlipidemia and diabetes.  All of those things have improved since she has decreased her weight.  She is still interested in a panniculectomy.      Review of Systems  Constitutional: Negative.   HENT: Negative.    Eyes: Negative.   Respiratory: Negative.    Cardiovascular: Negative.   Gastrointestinal: Negative.   Endocrine: Negative.   Genitourinary: Negative.   Musculoskeletal: Negative.   Neurological: Negative.   Hematological: Negative.        Objective:   Physical Exam     Assessment & Plan:  I connected with  Cassandra Tucker on 05/02/22 by phone and verified that I am speaking with the correct person using two identifiers.  The patient was at home and I was at the office.  We spent 5 minutes in discussion.    I discussed the limitations of evaluation and management by telemedicine. The patient expressed understanding and agreed to proceed.  Patient will finish her physical therapy and then come back and see [place].  I like to record a new weight and then we will likely be able to submit for coverage.

## 2022-05-09 ENCOUNTER — Ambulatory Visit: Payer: Medicaid Other | Attending: Plastic Surgery

## 2022-05-09 ENCOUNTER — Ambulatory Visit: Payer: Medicaid Other

## 2022-05-09 ENCOUNTER — Other Ambulatory Visit: Payer: Self-pay

## 2022-05-09 DIAGNOSIS — M546 Pain in thoracic spine: Secondary | ICD-10-CM | POA: Diagnosis not present

## 2022-05-09 DIAGNOSIS — R293 Abnormal posture: Secondary | ICD-10-CM | POA: Insufficient documentation

## 2022-05-09 DIAGNOSIS — G932 Benign intracranial hypertension: Secondary | ICD-10-CM | POA: Diagnosis not present

## 2022-05-09 DIAGNOSIS — M793 Panniculitis, unspecified: Secondary | ICD-10-CM | POA: Insufficient documentation

## 2022-05-09 DIAGNOSIS — G8929 Other chronic pain: Secondary | ICD-10-CM | POA: Diagnosis not present

## 2022-05-09 DIAGNOSIS — M545 Low back pain, unspecified: Secondary | ICD-10-CM | POA: Diagnosis not present

## 2022-05-09 DIAGNOSIS — Z8659 Personal history of other mental and behavioral disorders: Secondary | ICD-10-CM | POA: Diagnosis not present

## 2022-05-09 NOTE — Therapy (Signed)
OUTPATIENT PHYSICAL THERAPY THORACOLUMBAR EVALUATION   Patient Name: Cassandra Tucker MRN: [id number] DOB:1991-04-08, 30 y.o., female Today's Date: 05/09/2022  END OF SESSION:  PT End of Session - 05/09/22 0852     Visit Number 1    Number of Visits 6    Date for PT Re-Evaluation 06/09/22    PT Start Time 0901    PT Stop Time 0942    PT Time Calculation (min) 41 min    Activity Tolerance Patient tolerated treatment well    Behavior During Therapy [facility] for tasks assessed/performed             Past Medical History:  Diagnosis Date   Ankle fracture, left 2000   Anxiety    Arthritis    left wrist and left ankle   Asthma    Back pain    Back pain    Bronchitis    Depression    Diabetes (HCC) 01/11/2016   type II  history of   Family history of adverse reaction to anesthesia    gmother had problems with N/V    GERD (gastroesophageal reflux disease)    Headache    High cholesterol    History of kidney stones    HLD (hyperlipidemia)    HSV-2 infection    Hypertension 2013   Intracranial hypertension    psuedotumor cebrum   Joint pain    Joint pain    Kienbock's disease    Leg edema    OSA (obstructive sleep apnea)    cpap - does not know settings    Papilledema    Prediabetes    Pregnancy induced hypertension    Pseudotumor cerebri syndrome    RLS (restless legs syndrome)    Sinus tachycardia    SOB (shortness of breath)    Past Surgical History:  Procedure Laterality Date   CESAREAN SECTION N/A 07/17/2019   Procedure: CESAREAN SECTION;  Surgeon: Osborn Coho, MD;  Location: [facility];  Service: Obstetrics;  Laterality: N/A;   FRACTURE SURGERY  2005   ankle   FRACTURE SURGERY  2013   wrist (deteriorating bone)   LAPAROSCOPIC GASTRIC SLEEVE RESECTION N/A 05/08/2017   Procedure: LAPAROSCOPIC GASTRIC SLEEVE RESECTION;  Surgeon: Berna Bue, MD;  Location: [facility];  Service: General;  Laterality: N/A;   LAPAROSCOPIC GASTRIC SLEEVE RESECTION   05/08/2017   UPPER GI ENDOSCOPY  05/08/2017   Procedure: UPPER GI ENDOSCOPY;  Surgeon: Berna Bue, MD;  Location: [facility] ORS;  Service: General;;   urethra stretch     WRIST SURGERY  2015   Patient Active Problem List   Diagnosis Date Noted   Back pain 04/11/2022   Panniculitis 04/11/2022   GAD (generalized anxiety disorder) 04/21/2021   Depression, recurrent (HCC) 04/21/2021   History of cesarean section 07/24/2020   History of gestational hypertension 07/24/2020   Severe persistent asthma without complication 02/19/2020   Seasonal and perennial allergic rhinoconjunctivitis 01/01/2020   BMI 45.0-49.9, adult (HCC) 06/29/2019   Anemia 05/21/2019   History of depression 01/28/2019   History of bariatric surgery 01/13/2019   Genital herpes simplex 01/13/2019   Vitamin D deficiency 02/26/2017   Class 3 obesity with serious comorbidity and body mass index (BMI) of 60.0 to 69.9 in adult 02/26/2017   Controlled type 2 diabetes mellitus without complication, without long-term current use of insulin (HCC) 04/21/2016   Gastroesophageal reflux disease without esophagitis 04/21/2016   Pseudotumor cerebri syndrome 11/29/2015   Super obesity 11/29/2015  OSA on CPAP 11/29/2015   RLS (restless legs syndrome) 11/29/2015   Papilledema 06/16/2015   Kienbock disease, 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   Hyperlipidemia associated with type 2 diabetes mellitus (HCC) 01/21/2014   Sinus tachycardia 12/04/2011   Hypertension associated with type 2 diabetes mellitus (HCC) 12/04/2011   Morbid obesity (HCC) 11/07/2011    PCP: Sonny Masters, FNP  REFERRING PROVIDER: Peggye Form, DO   REFERRING DIAG: Chronic bilateral thoracic back pain; Panniculitis; Pseudotumor cerebri syndrome; Morbid obesity; History of depression   Rationale for Evaluation and Treatment: Rehabilitation  THERAPY DIAG:  Chronic bilateral low back pain without sciatica  Abnormal posture  ONSET DATE: about 1 year ago  SUBJECTIVE:                                                                                                                                                                                            SUBJECTIVE STATEMENT: Patient reports that she began having low back pain for about the last year since she lost over 100 pounds. She feels that her pain has been relatively stable, but she has good and bad days. She has noticed that she after sitting for long periods she has noticed pins and needles with rare numbness. She notices it primarily on the right side, but it can radiate down both legs. She has experienced pain like this previously, but her pain reduced as she lost weight. She is planning to get surgery to remove her excess skin, but she was told that she needed to try at least 6 visits of therapy first.   PERTINENT HISTORY:  Allergies, hypertension, type 2 diabetes, obesity, depression, anxiety, and arthritis  PAIN:  Are you having pain? Yes: NPRS scale: 8-8.5/10 Pain location: low back pain Pain description: primarily sore, but has experienced numbness, tingling, sharp, and shooting pain when aggravated Aggravating factors: standing (about 20-30 minutes), sitting, sit to stand transfers Relieving factors: none found   PRECAUTIONS: None  WEIGHT BEARING RESTRICTIONS: No  FALLS:  Has patient fallen in last 6 months? No  LIVING ENVIRONMENT: Lives with: lives with their family Lives in: House/apartment Stairs: Yes: External: 4 steps; can reach both Has following equipment at home: None  OCCUPATION: works for [employer], but works from home; prolonged sitting  PLOF: Independent  PATIENT GOALS: be able to stand longer to do her daily activities such as cooking, cleaning, and other household activities  NEXT MD VISIT: to be scheduled after therapy   OBJECTIVE:   PATIENT SURVEYS:  Modified Oswestry 34/100   SCREENING FOR RED FLAGS: Bowel or bladder incontinence: No Spinal tumors: No Cauda  equina syndrome: No  Compression fracture: No Abdominal aneurysm: No  COGNITION: Overall cognitive status: Within functional limits for tasks assessed     SENSATION: Patient reports no numbness or tingling currently  POSTURE: rounded shoulders, forward head, decreased lumbar lordosis, and increased thoracic kyphosis  PALPATION: TTP: bilateral lumbar paraspinals (reproduce her familiar pain)   LUMBAR JOINT MOBILITY:  Mild hypomobility with L1-2 and L4-5 reproducing her familiar pain  LUMBAR ROM:   AROM eval  Flexion 64; familiar pain when returning to neutral   Extension 16; familiar pain returning to neutral   Right lateral flexion 24  Left lateral flexion 14; familiar pain in right low back and hip  Right rotation 25% limited; reduced pain  Left rotation 25% limited   (Blank rows = not tested)  LOWER EXTREMITY ROM: WFL for activities assessed  LOWER EXTREMITY MMT:    MMT Right eval Left eval  Hip flexion 4/5 4+/5  Hip extension    Hip abduction    Hip adduction    Hip internal rotation    Hip external rotation    Knee flexion 5/5 4+/5  Knee extension 5/5 5/5  Ankle dorsiflexion 4+/5 4+/5  Ankle plantarflexion    Ankle inversion    Ankle eversion     (Blank rows = not tested)  LUMBAR SPECIAL TESTS:  Unable to assess due positional intolerance to supine Slump: negative bilaterally  FUNCTIONAL TESTS:  5 times sit to stand: 23.75 seconds w/o UE support; felt "tight" in low back  GAIT: Assistive device utilized: None Level of assistance: Complete Independence Comments: Decreased gait speed and absent hip extension bilaterally  TODAY'S TREATMENT:                                                                                                                              DATE:     PATIENT EDUCATION:  Education details: anatomy, benefits of exercise, POC, and prognosis Person educated: Patient Education method: Explanation Education comprehension:  verbalized understanding  HOME EXERCISE PROGRAM:   ASSESSMENT:  CLINICAL IMPRESSION: Patient is a 31 y.o. female who was seen today for physical therapy evaluation and treatment for chronic low back pain.  She presented with moderate pain severity and irritability with lumbar extension, left sidebending, lumbar joint mobility assessments, and palpation to her lumbar paraspinals reproducing her familiar pain.  She exhibited reduced lumbar active range of motion and lower extremity power.  Recommend that she continue with skilled physical therapy to address her impairments to maximize her functional mobility.  OBJECTIVE IMPAIRMENTS: decreased activity tolerance, decreased mobility, difficulty walking, decreased ROM, decreased strength, hypomobility, impaired tone, postural dysfunction, and pain.   ACTIVITY LIMITATIONS: lifting, bending, standing, transfers, dressing, and locomotion level  PARTICIPATION LIMITATIONS: meal prep, cleaning, laundry, shopping, and community activity  PERSONAL FACTORS: Time since onset of injury/illness/exacerbation and 3+ comorbidities: Allergies, hypertension, type 2 diabetes, obesity, depression, anxiety, and arthritis  are also affecting patient's functional outcome.   REHAB POTENTIAL: Fair  CLINICAL DECISION MAKING: Evolving/moderate complexity  EVALUATION COMPLEXITY: Moderate   GOALS: Goals reviewed with patient? Yes  LONG TERM GOALS: Target date: 05/30/22  Patient will be independent with her HEP. Baseline:  Goal status: INITIAL  2.  Patient will be able to complete her daily activities without her familiar pain exceeding 6/10. Baseline:  Goal status: INITIAL  3.  Patient will be able to stand for at least 45 minutes without being limited by her familiar low back pain for improved function with her daily activities. Baseline:  Goal status: INITIAL  4.  Patient will improve her ODI score to at least 24/100 or less.  Baseline:  Goal status:  INITIAL  PLAN:  PT FREQUENCY: 2x/week  PT DURATION: 3 weeks  PLANNED INTERVENTIONS: Therapeutic exercises, Therapeutic activity, Neuromuscular re-education, Gait training, Patient/Family education, Self Care, Joint mobilization, Spinal mobilization, Cryotherapy, Moist heat, Manual therapy, and Re-evaluation.  PLAN FOR NEXT SESSION: NuStep, lumbar strengthening, lumbar stabilization, and manual therapy   Granville Lewis, PT 05/09/2022, 10:34 AM

## 2022-05-15 ENCOUNTER — Ambulatory Visit: Payer: Medicaid Other | Attending: Plastic Surgery

## 2022-05-15 DIAGNOSIS — R293 Abnormal posture: Secondary | ICD-10-CM | POA: Insufficient documentation

## 2022-05-15 DIAGNOSIS — M545 Low back pain, unspecified: Secondary | ICD-10-CM | POA: Diagnosis not present

## 2022-05-15 DIAGNOSIS — G8929 Other chronic pain: Secondary | ICD-10-CM | POA: Insufficient documentation

## 2022-05-15 NOTE — Therapy (Signed)
OUTPATIENT PHYSICAL THERAPY THORACOLUMBAR TREATMENT   Patient Name: Cassandra Tucker MRN: [id number] DOB:30-Jun-1990, 30 y.o., female Today's Date: 05/15/2022  END OF SESSION:  PT End of Session - 05/15/22 0823     Visit Number 2    Number of Visits 6    Date for PT Re-Evaluation 06/09/22    PT Start Time [phone]   Patient arrived late to her appointment.   PT Stop Time 0900    PT Time Calculation (min) 41 min    Activity Tolerance Patient tolerated treatment well    Behavior During Therapy WFL for tasks assessed/performed              Past Medical History:  Diagnosis Date   Ankle fracture, left 2000   Anxiety    Arthritis    left wrist and left ankle   Asthma    Back pain    Back pain    Bronchitis    Depression    Diabetes (HCC) 01/11/2016   type II  history of   Family history of adverse reaction to anesthesia    gmother had problems with N/V    GERD (gastroesophageal reflux disease)    Headache    High cholesterol    History of kidney stones    HLD (hyperlipidemia)    HSV-2 infection    Hypertension 2013   Intracranial hypertension    psuedotumor cebrum   Joint pain    Joint pain    Kienbock's disease    Leg edema    OSA (obstructive sleep apnea)    cpap - does not know settings    Papilledema    Prediabetes    Pregnancy induced hypertension    Pseudotumor cerebri syndrome    RLS (restless legs syndrome)    Sinus tachycardia    SOB (shortness of breath)    Past Surgical History:  Procedure Laterality Date   CESAREAN SECTION N/A 07/17/2019   Procedure: CESAREAN SECTION;  Surgeon: Osborn Coho, MD;  Location: [facility];  Service: Obstetrics;  Laterality: N/A;   FRACTURE SURGERY  2005   ankle   FRACTURE SURGERY  2013   wrist (deteriorating bone)   LAPAROSCOPIC GASTRIC SLEEVE RESECTION N/A 05/08/2017   Procedure: LAPAROSCOPIC GASTRIC SLEEVE RESECTION;  Surgeon: Berna Bue, MD;  Location: [facility];  Service: General;  Laterality: N/A;    LAPAROSCOPIC GASTRIC SLEEVE RESECTION  05/08/2017   UPPER GI ENDOSCOPY  05/08/2017   Procedure: UPPER GI ENDOSCOPY;  Surgeon: Berna Bue, MD;  Location: [facility];  Service: General;;   urethra stretch     WRIST SURGERY  2015   Patient Active Problem List   Diagnosis Date Noted   Back pain 04/11/2022   Panniculitis 04/11/2022   GAD (generalized anxiety disorder) 04/21/2021   Depression, recurrent (HCC) 04/21/2021   History of cesarean section 07/24/2020   History of gestational hypertension 07/24/2020   Severe persistent asthma without complication 02/19/2020   Seasonal and perennial allergic rhinoconjunctivitis 01/01/2020   BMI 45.0-49.9, adult (HCC) 06/29/2019   Anemia 05/21/2019   History of depression 01/28/2019   History of bariatric surgery 01/13/2019   Genital herpes simplex 01/13/2019   Vitamin D deficiency 02/26/2017   Class 3 obesity with serious comorbidity and body mass index (BMI) of 60.0 to 69.9 in adult 02/26/2017   Controlled type 2 diabetes mellitus without complication, without long-term current use of insulin (HCC) 04/21/2016   Gastroesophageal reflux disease without esophagitis 04/21/2016   Pseudotumor cerebri syndrome  11/29/2015   Super obesity 11/29/2015   OSA on CPAP 11/29/2015   RLS (restless legs syndrome) 11/29/2015   Papilledema 06/16/2015   Kienbock disease, 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   Hyperlipidemia associated with type 2 diabetes mellitus (HCC) 01/21/2014   Sinus tachycardia 12/04/2011   Hypertension associated with type 2 diabetes mellitus (HCC) 12/04/2011   Morbid obesity (HCC) 11/07/2011    PCP: Sonny Mastersakes, Linda M, FNP  REFERRING PROVIDER: Peggye Formillingham, Claire S, DO   REFERRING DIAG: Chronic bilateral thoracic back pain; Panniculitis; Pseudotumor cerebri syndrome; Morbid obesity; History of depression   Rationale for Evaluation and Treatment: Rehabilitation  THERAPY DIAG:  Chronic bilateral low back pain without sciatica  Abnormal posture  ONSET  DATE: about 1 year ago  SUBJECTIVE:                                                                                                                                                                                           SUBJECTIVE STATEMENT: Patient reports that she is not hurting today.   PERTINENT HISTORY:  Allergies, hypertension, type 2 diabetes, obesity, depression, anxiety, and arthritis  PAIN:  Are you having pain? Yes: NPRS scale: 0/10 Pain location: low back pain Pain description: primarily sore, but has experienced numbness, tingling, sharp, and shooting pain when aggravated Aggravating factors: standing (about 20-30 minutes), sitting, sit to stand transfers Relieving factors: none found   PRECAUTIONS: None  WEIGHT BEARING RESTRICTIONS: No  FALLS:  Has patient fallen in last 6 months? No  LIVING ENVIRONMENT: Lives with: lives with their family Lives in: House/apartment Stairs: Yes: External: 4 steps; can reach both Has following equipment at home: None  OCCUPATION: works for [employer], but works from home; prolonged sitting  PLOF: Independent  PATIENT GOALS: be able to stand longer to do her daily activities such as cooking, cleaning, and other household activities  NEXT MD VISIT: to be scheduled after therapy   OBJECTIVE: all objective measures were assessed at her initial evaluation on 05/09/22 unless otherwise noted  PATIENT SURVEYS:  Modified Oswestry 34/100   SCREENING FOR RED FLAGS: Bowel or bladder incontinence: No Spinal tumors: No Cauda equina syndrome: No Compression fracture: No Abdominal aneurysm: No  COGNITION: Overall cognitive status: Within functional limits for tasks assessed     SENSATION: Patient reports no numbness or tingling currently  POSTURE: rounded shoulders, forward head, decreased lumbar lordosis, and increased thoracic kyphosis  PALPATION: TTP: bilateral lumbar paraspinals (reproduce her familiar pain)   LUMBAR JOINT  MOBILITY:  Mild hypomobility with L1-2 and L4-5 reproducing her familiar pain  LUMBAR ROM:   AROM eval  Flexion 64; familiar pain when returning to  neutral   Extension 16; familiar pain returning to neutral   Right lateral flexion 24  Left lateral flexion 14; familiar pain in right low back and hip  Right rotation 25% limited; reduced pain  Left rotation 25% limited   (Blank rows = not tested)  LOWER EXTREMITY ROM: WFL for activities assessed  LOWER EXTREMITY MMT:    MMT Right eval Left eval  Hip flexion 4/5 4+/5  Hip extension    Hip abduction    Hip adduction    Hip internal rotation    Hip external rotation    Knee flexion 5/5 4+/5  Knee extension 5/5 5/5  Ankle dorsiflexion 4+/5 4+/5  Ankle plantarflexion    Ankle inversion    Ankle eversion     (Blank rows = not tested)  LUMBAR SPECIAL TESTS:  Unable to assess due positional intolerance to supine Slump: negative bilaterally  FUNCTIONAL TESTS:  5 times sit to stand: 23.75 seconds w/o UE support; felt "tight" in low back  GAIT: Assistive device utilized: None Level of assistance: Complete Independence Comments: Decreased gait speed and absent hip extension bilaterally  TODAY'S TREATMENT:                                                                                                                              DATE:                                     12/4 EXERCISE LOG  Exercise Repetitions and Resistance Comments  Nustep  L3 x 17 minutes   Ball roll out  2 minutes   Isometric ball press 2 minutes w/ 5 second hold   Standing hip extension 3 minutes Alternating LE   Slouch overcorrect 3 minutes   Rocker board  5 minutes    Blank cell = exercise not performed today   PATIENT EDUCATION:  Education details: anatomy, benefits of exercise, POC, and prognosis Person educated: Patient Education method: Explanation Education comprehension: verbalized understanding  HOME EXERCISE  PROGRAM:   ASSESSMENT:  CLINICAL IMPRESSION: Patient was introduced to multiple new interventions for improved lumbar and lower extremity strength needed for improved function with her daily activities. She required minimal cueing with today's new interventions for proper exercise performance. She reported a mild "pull" with today's interventions, but no pain or discomfort. She reported feeling a little tight across her back upon the conclusion of treatment. She continues to require skilled physical therapy to address her remaining impairments to maximize her functional mobility.   OBJECTIVE IMPAIRMENTS: decreased activity tolerance, decreased mobility, difficulty walking, decreased ROM, decreased strength, hypomobility, impaired tone, postural dysfunction, and pain.   ACTIVITY LIMITATIONS: lifting, bending, standing, transfers, dressing, and locomotion level  PARTICIPATION LIMITATIONS: meal prep, cleaning, laundry, shopping, and community activity  PERSONAL FACTORS: Time since onset of injury/illness/exacerbation and 3+ comorbidities: Allergies, hypertension, type 2 diabetes, obesity, depression,  anxiety, and arthritis  are also affecting patient's functional outcome.   REHAB POTENTIAL: Fair    CLINICAL DECISION MAKING: Evolving/moderate complexity  EVALUATION COMPLEXITY: Moderate   GOALS: Goals reviewed with patient? Yes  LONG TERM GOALS: Target date: 05/30/22  Patient will be independent with her HEP. Baseline:  Goal status: INITIAL  2.  Patient will be able to complete her daily activities without her familiar pain exceeding 6/10. Baseline:  Goal status: INITIAL  3.  Patient will be able to stand for at least 45 minutes without being limited by her familiar low back pain for improved function with her daily activities. Baseline:  Goal status: INITIAL  4.  Patient will improve her ODI score to at least 24/100 or less.  Baseline:  Goal status: INITIAL  PLAN:  PT  FREQUENCY: 2x/week  PT DURATION: 3 weeks  PLANNED INTERVENTIONS: Therapeutic exercises, Therapeutic activity, Neuromuscular re-education, Gait training, Patient/Family education, Self Care, Joint mobilization, Spinal mobilization, Cryotherapy, Moist heat, Manual therapy, and Re-evaluation.  PLAN FOR NEXT SESSION: NuStep, lumbar strengthening, lumbar stabilization, and manual therapy   Granville Lewis, PT 05/15/2022, 12:46 PM

## 2022-05-17 NOTE — Progress Notes (Unsigned)
Follow Up Note  RE: Cassandra Tucker MRN: [id number] DOB: [birth year] Date of Office Visit: 05/18/2022  Referring provider: Baruch Gouty, FNP Primary care provider: Baruch Gouty, FNP  Chief Complaint: No chief complaint on file.  History of Present Illness: I had the pleasure of seeing Cassandra Tucker for a follow up visit at the [facility] on 05/17/2022. She is a 31 y.o. female, who is being followed for asthma on Fasenra, allergic rhinoconjunctivitis and rash. Her previous allergy office visit was on 07/19/2021 with Dr. Maudie Mercury. Today is a regular follow up visit.  Severe persistent asthma without complication Past history - Symptoms of chest tightness, shortness of breath, coughing, wheezing, nocturnal awakenings for 1 year. History of reflux but no longer on PPI. Ex-smoker. 2021 spirometry showed: normal pattern and 13% improvement in FEV1 post bronchodilator treatment. Clinically feeling better. Interim history - Well-controlled with Fasenra injections. Noticed increased symptoms when late by 2 weeks.  Today's spirometry was normal - improved from previous one.  Continue Fasenra injections at home every 8 weeks.  Daily controller medication(s): Restart Singulair (montelukast) 44m daily at night. During upper respiratory infections/asthma flares:  Start Symbicort 1677m 2 puffs twice a day with spacer and rinse mouth afterwards for 1-2 weeks until your breathing symptoms return to baseline.  Pretreat with albuterol 2 puffs or albuterol nebulizer.  If you need to use your albuterol nebulizer machine back to back within 15-30 minutes with no relief then please go to the ER/urgent care for further evaluation.  May use albuterol rescue inhaler 2 puffs every 4 to 6 hours as needed for shortness of breath, chest tightness, coughing, and wheezing. May use albuterol rescue inhaler 2 puffs 5 to 15 minutes prior to strenuous physical activities. Monitor frequency of use.  Get  spirometry at next visit.   Seasonal and perennial allergic rhinoconjunctivitis Past history - Perennial rhino conjunctivitis symptoms for 20+ years with worsening in the spring. No previous ENT evaluation. Tried Claritin, Flonase and Zyrtec with some benefit. 2021 skin testing showed: Positive to grass, dust mites, cats, feathers. Negative to common foods.  Interim history - asymptomatic with no daily meds.  Continue environmental control measures as below. Start Singulair (montelukast) 1037maily at night.  May use over the counter antihistamines such as Zyrtec (cetirizine), Claritin (loratadine), Allegra (fexofenadine), or Xyzal (levocetirizine) daily. May take it twice a day if needed.  May use Flonase (fluticasone) nasal spray 1 spray per nostril twice a day as needed for nasal congestion.    Rash and other nonspecific skin eruption Rash on leg x 1 week. No trigger noted. Resolved. Monitor symptoms and take pictures.   Return in about 4 months (around 11/16/2021).  Assessment and Plan: Cassandra Tucker is a [age]o. female with: No problem-specific Assessment & Plan notes found for this encounter.  No follow-ups on file.  No orders of the defined types were placed in this encounter.  Lab Orders  No laboratory test(s) ordered today    Diagnostics: Spirometry:  Tracings reviewed. Her effort: {Blank single:19197::"Good reproducible efforts.","It was hard to get consistent efforts and there is a question as to whether this reflects a maximal maneuver.","Poor effort, data can not be interpreted."} FVC: ***L FEV1: ***L, ***% predicted FEV1/FVC ratio: ***% Interpretation: {Blank single:19197::"Spirometry consistent with mild obstructive disease","Spirometry consistent with moderate obstructive disease","Spirometry consistent with severe obstructive disease","Spirometry consistent with possible restrictive disease","Spirometry consistent with mixed obstructive and restrictive disease","Spirometry  uninterpretable due to technique","Spirometry consistent with normal  pattern","No overt abnormalities noted given today's efforts"}.  Please see scanned spirometry results for details.  Skin Testing: {Blank single:19197::"Select foods","Environmental allergy panel","Environmental allergy panel and select foods","Food allergy panel","None","Deferred due to recent antihistamines use"}. *** Results discussed with patient/family.   Medication List:  Current Outpatient Medications  Medication Sig Dispense Refill   ACCU-CHEK GUIDE test strip USE UP TO FOUR TIMES DAILY AS DIRECTED E10.9, E11.9 400 strip 3   Accu-Chek Softclix Lancets lancets USE UP TO FOUR TIMES DAILY AS DIRECTED E10.9, E11.9 400 each 3   albuterol (PROVENTIL) (2.5 MG/3ML) 0.083% nebulizer solution Take 3 mLs (2.5 mg total) by nebulization every 4 (four) hours as needed for wheezing or shortness of breath. 75 mL 1   Benralizumab (FASENRA PEN) 30 MG/ML SOAJ Inject 1 mL (30 mg total) into the skin every 8 (eight) weeks. 1 mL 6   blood glucose meter kit and supplies Dispense based on patient and insurance preference. Use up to four times daily as directed. (FOR ICD-10 E10.9, E11.9). 1 each 0   cariprazine (VRAYLAR) 3 MG capsule Take 1 capsule (3 mg total) by mouth daily. (Patient taking differently: Take 4.5 mg by mouth daily.) 30 capsule 6   Dulaglutide (TRULICITY) 3 CB/6.3AG SOPN Inject 3 mg as directed once a week. 0.5 mL 6   FASENRA PEN 30 MG/ML SOAJ INJECT 1 PEN UNDER THE SKIN EVERY 8 WEEKS. 1 mL 6   FLUoxetine (PROZAC) 20 MG capsule Take 1 capsule (20 mg total) by mouth daily. 30 capsule 3   FLUoxetine (PROZAC) 40 MG capsule TAKE 1 CAPSULE (40 MG TOTAL) BY MOUTH DAILY. TAKE WITH 20 MG (TOTAL OF 60 MG DAILY) 90 capsule 0   fluticasone (FLONASE) 50 MCG/ACT nasal spray Place 1 spray into both nostrils 2 (two) times daily as needed (nasal congestion). 16 g 5   levocetirizine (XYZAL) 5 MG tablet Take 1 tablet (5 mg total) by mouth every  evening. 30 tablet 5   levonorgestrel (MIRENA, 52 MG,) 20 MCG/24HR IUD Mirena 20 mcg/24 hours (6 yrs) 52 mg intrauterine device  Take 1 device by intrauterine route.     lisinopril-hydrochlorothiazide (ZESTORETIC) 20-25 MG tablet Take 1 tablet by mouth daily. 90 tablet 3   NIFEdipine (ADALAT CC) 60 MG 24 hr tablet Take 60 mg by mouth every morning. (Patient not taking: Reported on 04/11/2022)     NYSTATIN powder Apply topically 2 (two) times daily.     pravastatin (PRAVACHOL) 40 MG tablet Take 1 tablet (40 mg total) by mouth daily. 90 tablet 3   VENTOLIN HFA 108 (90 Base) MCG/ACT inhaler INHALE 2 PUFFS BY MOUTH EVERY 6 HOURS AS NEEDED FOR WHEEZING OR SHORTNESS OF BREATH 54 each 1   No current facility-administered medications for this visit.   Allergies: Allergies  Allergen Reactions   Cats Claw (Uncaria Tomentosa) Itching, Shortness Of Breath and Swelling   Dust Mite Extract Itching and Shortness Of Breath   Grass Pollen(K-O-R-T-Swt Vern) Itching and Shortness Of Breath   Mixed Feathers Itching, Shortness Of Breath and Swelling   I reviewed her past medical history, social history, family history, and environmental history and no significant changes have been reported from her previous visit.  Review of Systems  Constitutional:  Negative for appetite change, chills, fever and unexpected weight change.  HENT:  Negative for congestion, postnasal drip, rhinorrhea and sneezing.   Eyes:  Negative for itching.  Respiratory:  Negative for cough, chest tightness, shortness of breath and wheezing.   Cardiovascular:  Negative  for chest pain.  Gastrointestinal:  Negative for abdominal pain.  Genitourinary:  Negative for difficulty urinating.  Skin:  Negative for rash.  Allergic/Immunologic: Positive for environmental allergies. Negative for food allergies.  Neurological:  Negative for headaches.    Objective: There were no vitals taken for this visit. There is no height or weight on file to  calculate BMI. Physical Exam Vitals and nursing note reviewed.  Constitutional:      Appearance: Normal appearance. She is well-developed. She is obese.  HENT:     Head: Normocephalic and atraumatic.     Right Ear: Tympanic membrane and external ear normal.     Left Ear: Tympanic membrane and external ear normal.     Nose: Nose normal.     Mouth/Throat:     Mouth: Mucous membranes are moist.     Pharynx: Oropharynx is clear.  Eyes:     Conjunctiva/sclera: Conjunctivae normal.  Cardiovascular:     Rate and Rhythm: Normal rate and regular rhythm.     Heart sounds: Normal heart sounds. No murmur heard.    No friction rub. No gallop.  Pulmonary:     Effort: Pulmonary effort is normal.     Breath sounds: No wheezing, rhonchi or rales.  Musculoskeletal:     Cervical back: Neck supple.  Skin:    General: Skin is warm.     Findings: No rash.  Neurological:     Mental Status: She is alert and oriented to person, place, and time.  Psychiatric:        Behavior: Behavior normal.    Previous notes and tests were reviewed. The plan was reviewed with the patient/family, and all questions/concerned were addressed.  It was my pleasure to see Briony today and participate in her care. Please feel free to contact me with any questions or concerns.  Sincerely,  Rexene Alberts, DO Allergy & Immunology  [facility] office: [place] office: [phone]

## 2022-05-18 ENCOUNTER — Ambulatory Visit: Payer: Medicaid Other | Admitting: Physical Therapy

## 2022-05-18 ENCOUNTER — Encounter: Payer: Self-pay | Admitting: Physical Therapy

## 2022-05-18 ENCOUNTER — Encounter: Payer: Self-pay | Admitting: Allergy

## 2022-05-18 ENCOUNTER — Ambulatory Visit: Payer: Medicaid Other | Admitting: Allergy

## 2022-05-18 VITALS — BP 114/70 | HR 75 | Temp 98.1°F | Resp 20 | Ht 67.0 in | Wt 267.0 lb

## 2022-05-18 DIAGNOSIS — J4550 Severe persistent asthma, uncomplicated: Secondary | ICD-10-CM

## 2022-05-18 DIAGNOSIS — J302 Other seasonal allergic rhinitis: Secondary | ICD-10-CM

## 2022-05-18 DIAGNOSIS — R21 Rash and other nonspecific skin eruption: Secondary | ICD-10-CM

## 2022-05-18 DIAGNOSIS — M545 Low back pain, unspecified: Secondary | ICD-10-CM | POA: Diagnosis not present

## 2022-05-18 DIAGNOSIS — Z720 Tobacco use: Secondary | ICD-10-CM

## 2022-05-18 DIAGNOSIS — R293 Abnormal posture: Secondary | ICD-10-CM

## 2022-05-18 DIAGNOSIS — G8929 Other chronic pain: Secondary | ICD-10-CM

## 2022-05-18 DIAGNOSIS — H1010 Acute atopic conjunctivitis, unspecified eye: Secondary | ICD-10-CM

## 2022-05-18 DIAGNOSIS — H1013 Acute atopic conjunctivitis, bilateral: Secondary | ICD-10-CM

## 2022-05-18 MED ORDER — ALBUTEROL SULFATE HFA 108 (90 BASE) MCG/ACT IN AERS: 2.0000 | INHALATION_SPRAY | RESPIRATORY_TRACT | 1 refills | Status: DC | PRN

## 2022-05-18 MED ORDER — LEVOCETIRIZINE DIHYDROCHLORIDE 5 MG PO TABS: 5.0000 mg | ORAL_TABLET | Freq: Every evening | ORAL | 3 refills | Status: DC

## 2022-05-18 MED ORDER — MONTELUKAST SODIUM 10 MG PO TABS: 10.0000 mg | ORAL_TABLET | Freq: Every day | ORAL | 3 refills | Status: DC

## 2022-05-18 NOTE — Assessment & Plan Note (Signed)
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 No previous ENT evaluation. Tried Claritin, Flonase and Zyrtec with some benefit. 2021 skin testing showed: Positive to grass, dust mites, cats, feathers. Negative to common foods.  Interim history - well controlled with Singulair and Xyzal. Continue environmental control measures as below. Continue Singulair (montelukast) 10mg  daily at night.  May use over the counter antihistamines such as Zyrtec (cetirizine), Claritin (loratadine), Allegra (fexofenadine), or Xyzal (levocetirizine) daily. May take it twice a day if needed.  May use Flonase (fluticasone) nasal spray 1 spray per nostril twice a day as needed for nasal congestion.

## 2022-05-18 NOTE — Assessment & Plan Note (Signed)
Past history - Symptoms of chest tightness, shortness of breath, coughing, wheezing, nocturnal awakenings for 1 year. History of reflux but no longer on PPI. Ex-smoker. 2021 spirometry showed: normal pattern and 13% improvement in FEV1 post bronchodilator treatment. Clinically feeling better. Interim history - Well-controlled with Fasenra injections with no issues. No prednisone, albuterol, Symbicort use.  Today's spirometry was unremarkable - but not able to compare predicted values due to equipment technology issues. Continue Fasenra injections at home every 8 weeks.  Daily controller medication(s): Continue Singulair (montelukast) 10mg  daily at night. During upper respiratory infections/asthma flares:  Start Symbicort 2 puffs twice a day with spacer and rinse mouth afterwards for 1-2 weeks until your breathing symptoms return to baseline.  Pretreat with albuterol 2 puffs or albuterol nebulizer.  If you need to use your albuterol nebulizer machine back to back within 15-30 minutes with no relief then please go to the ER/urgent care for further evaluation.  May use albuterol rescue inhaler 2 puffs every 4 to 6 hours as needed for shortness of breath, chest tightness, coughing, and wheezing. May use albuterol rescue inhaler 2 puffs 5 to 15 minutes prior to strenuous physical activities. Monitor frequency of use.  Recommend getting the flu vaccine at your PCP's office every fall. Discussed smoking cessation. Get spirometry at next visit.

## 2022-05-18 NOTE — Therapy (Signed)
OUTPATIENT PHYSICAL THERAPY THORACOLUMBAR TREATMENT   Patient Name: Cassandra Tucker MRN: [id number] DOB:1991/01/29, 31 y.o., female Today's Date: 05/18/2022  END OF SESSION:  PT End of Session - 05/18/22 0948     Visit Number 3    Number of Visits 6    Date for PT Re-Evaluation 06/09/22    PT Start Time 0947    PT Stop Time 1036    PT Time Calculation (min) 49 min    Activity Tolerance Patient tolerated treatment well    Behavior During Therapy [facility] for tasks assessed/performed            Past Medical History:  Diagnosis Date   Ankle fracture, left 2000   Anxiety    Arthritis    left wrist and left ankle   Asthma    Back pain    Back pain    Bronchitis    Depression    Diabetes (HCC) 01/11/2016   type II  history of   Family history of adverse reaction to anesthesia    gmother had problems with N/V    GERD (gastroesophageal reflux disease)    Headache    High cholesterol    History of kidney stones    HLD (hyperlipidemia)    HSV-2 infection    Hypertension 2013   Intracranial hypertension    psuedotumor cebrum   Joint pain    Joint pain    Kienbock's disease    Leg edema    OSA (obstructive sleep apnea)    cpap - does not know settings    Papilledema    Prediabetes    Pregnancy induced hypertension    Pseudotumor cerebri syndrome    RLS (restless legs syndrome)    Sinus tachycardia    SOB (shortness of breath)    Past Surgical History:  Procedure Laterality Date   CESAREAN SECTION N/A 07/17/2019   Procedure: CESAREAN SECTION;  Surgeon: Osborn Coho, MD;  Location: [facility] LD ORS;  Service: Obstetrics;  Laterality: N/A;   FRACTURE SURGERY  2005   ankle   FRACTURE SURGERY  2013   wrist (deteriorating bone)   LAPAROSCOPIC GASTRIC SLEEVE RESECTION N/A 05/08/2017   Procedure: LAPAROSCOPIC GASTRIC SLEEVE RESECTION;  Surgeon: Berna Bue, MD;  Location: [facility] ORS;  Service: General;  Laterality: N/A;   LAPAROSCOPIC GASTRIC SLEEVE RESECTION  05/08/2017    UPPER GI ENDOSCOPY  05/08/2017   Procedure: UPPER GI ENDOSCOPY;  Surgeon: Berna Bue, MD;  Location: [facility] ORS;  Service: General;;   urethra stretch     WRIST SURGERY  2015   Patient Active Problem List   Diagnosis Date Noted   Back pain 04/11/2022   Panniculitis 04/11/2022   GAD (generalized anxiety disorder) 04/21/2021   Depression, recurrent (HCC) 04/21/2021   History of cesarean section 07/24/2020   History of gestational hypertension 07/24/2020   Severe persistent asthma without complication 02/19/2020   Seasonal and perennial allergic rhinoconjunctivitis 01/01/2020   BMI 45.0-49.9, adult (HCC) 06/29/2019   Anemia 05/21/2019   History of depression 01/28/2019   History of bariatric surgery 01/13/2019   Genital herpes simplex 01/13/2019   Vitamin D deficiency 02/26/2017   Class 3 obesity with serious comorbidity and body mass index (BMI) of 60.0 to 69.9 in adult 02/26/2017   Controlled type 2 diabetes mellitus without complication, without long-term current use of insulin (HCC) 04/21/2016   Gastroesophageal reflux disease without esophagitis 04/21/2016   Pseudotumor cerebri syndrome 11/29/2015   Super obesity 11/29/2015   OSA  on CPAP 11/29/2015   RLS (restless legs syndrome) 11/29/2015   Papilledema 06/16/2015   Kienbock disease, 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   Hyperlipidemia associated with type 2 diabetes mellitus ([place]) 01/21/2014   Sinus tachycardia 12/04/2011   Hypertension associated with type 2 diabetes mellitus ([place]) 12/04/2011   Morbid obesity ([place]) 11/07/2011   PCP: Baruch Gouty, FNP  REFERRING PROVIDER: Wallace Going, DO   REFERRING DIAG: Chronic bilateral thoracic back pain; Panniculitis; Pseudotumor cerebri syndrome; Morbid obesity; History of depression   Rationale for Evaluation and Treatment: Rehabilitation  THERAPY DIAG:  Chronic bilateral low back pain without sciatica  Abnormal posture  ONSET DATE: about 1 year ago  SUBJECTIVE:                                                                                                                                                                                            SUBJECTIVE STATEMENT: Reports that she has minimal LBP as she hasn't had to do much this morning.  PERTINENT HISTORY:  Allergies, hypertension, type 2 diabetes, obesity, depression, anxiety, and arthritis  PAIN:  Are you having pain? Yes: NPRS scale: "minimal"/10 Pain location: low back pain Pain description: primarily sore, but has experienced numbness, tingling, sharp, and shooting pain when aggravated Aggravating factors: standing (about 20-30 minutes), sitting, sit to stand transfers Relieving factors: none found   PRECAUTIONS: None  PATIENT GOALS: be able to stand longer to do her daily activities such as cooking, cleaning, and other household activities  NEXT MD VISIT: to be scheduled after therapy   OBJECTIVE: all objective measures were assessed at her initial evaluation on 05/09/22 unless otherwise noted  PATIENT SURVEYS:  Modified Oswestry 34/100   LUMBAR ROM:   AROM eval  Flexion 64; familiar pain when returning to neutral   Extension 16; familiar pain returning to neutral   Right lateral flexion 24  Left lateral flexion 14; familiar pain in right low back and hip  Right rotation 25% limited; reduced pain  Left rotation 25% limited   (Blank rows = not tested)  LOWER EXTREMITY ROM: WFL for activities assessed  LOWER EXTREMITY MMT:    MMT Right eval Left eval  Hip flexion 4/5 4+/5  Hip extension    Hip abduction    Hip adduction    Hip internal rotation    Hip external rotation    Knee flexion 5/5 4+/5  Knee extension 5/5 5/5  Ankle dorsiflexion 4+/5 4+/5  Ankle plantarflexion    Ankle inversion    Ankle eversion     (Blank rows = not tested)  TODAY'S TREATMENT:  DATE:                                     12/7 EXERCISE LOG  Exercise Repetitions and Resistance Comments  Nustep  L4 x 18 minutes   Shoulder extension  Blue XTS x20 reps   Isometric core press 10 reps 5 sec holds   Standing hip extension 20 reps Alternating LE   Bridge 30 reps   marching 20 reps    Blank cell = exercise not performed today   PATIENT EDUCATION:  Education details: Statistician Person educated: Patient Education method: Explanation, handout Education comprehension: verbalized understanding  HOME EXERCISE PROGRAM:  ASSESSMENT:  CLINICAL IMPRESSION: Patient presented in clinic with minimal LBP which is exacerbated by prolonged sitting which she does for her job. Patient states that she catches herself applying extension pressure to low back at times. Patient progressed through therex for lumbar extensor training as well as postural training. No complaints of pain during therex session. Patient provided new posture and ADLs handout for movement pattern changes. Patient verbalized understanding for education.  OBJECTIVE IMPAIRMENTS: decreased activity tolerance, decreased mobility, difficulty walking, decreased ROM, decreased strength, hypomobility, impaired tone, postural dysfunction, and pain.   ACTIVITY LIMITATIONS: lifting, bending, standing, transfers, dressing, and locomotion level  PARTICIPATION LIMITATIONS: meal prep, cleaning, laundry, shopping, and community activity  PERSONAL FACTORS: Time since onset of injury/illness/exacerbation and 3+ comorbidities: Allergies, hypertension, type 2 diabetes, obesity, depression, anxiety, and arthritis  are also affecting patient's functional outcome.   REHAB POTENTIAL: Fair    CLINICAL DECISION MAKING: Evolving/moderate complexity  EVALUATION COMPLEXITY: Moderate  GOALS: Goals reviewed with patient? Yes  LONG TERM GOALS: Target date: 05/30/22  Patient will be independent with her HEP. Baseline:  Goal  status: On-going  2.  Patient will be able to complete her daily activities without her familiar pain exceeding 6/10. Baseline:  Goal status: On-going  3.  Patient will be able to stand for at least 45 minutes without being limited by her familiar low back pain for improved function with her daily activities. Baseline:  Goal status: On-going  4.  Patient will improve her ODI score to at least 24/100 or less.  Baseline:  Goal status: On-going  PLAN:  PT FREQUENCY: 2x/week  PT DURATION: 3 weeks  PLANNED INTERVENTIONS: Therapeutic exercises, Therapeutic activity, Neuromuscular re-education, Gait training, Patient/Family education, Self Care, Joint mobilization, Spinal mobilization, Cryotherapy, Moist heat, Manual therapy, and Re-evaluation.  PLAN FOR NEXT SESSION: NuStep, lumbar strengthening, lumbar stabilization, and manual therapy  Standley Brooking, PTA 05/18/2022, 12:07 PM

## 2022-05-18 NOTE — Patient Instructions (Addendum)
Environmental allergies Past skin testing showed: Positive to grass, dust mites, cats, feathers. Continue environmental control measures as below. Continue Singulair (montelukast) 10mg  daily at night.  May use over the counter antihistamines such as Zyrtec (cetirizine), Claritin (loratadine), Allegra (fexofenadine), or Xyzal (levocetirizine) daily. May take it twice a day if needed.  May use Flonase (fluticasone) nasal spray 1 spray per nostril twice a day as needed for nasal congestion.   Asthma: Continue Fasenra injections at home every 8 weeks.  Daily controller medication(s): Continue Singulair (montelukast) 10mg  daily at night. During upper respiratory infections/asthma flares:  Start Symbicort 2 puffs twice a day with spacer and rinse mouth afterwards for 1-2 weeks until your breathing symptoms return to baseline.  Pretreat with albuterol 2 puffs or albuterol nebulizer.  If you need to use your albuterol nebulizer machine back to back within 15-30 minutes with no relief then please go to the ER/urgent care for further evaluation.  May use albuterol rescue inhaler 2 puffs every 4 to 6 hours as needed for shortness of breath, chest tightness, coughing, and wheezing. May use albuterol rescue inhaler 2 puffs 5 to 15 minutes prior to strenuous physical activities. Monitor frequency of use.  Asthma control goals:  Full participation in all desired activities (may need albuterol before activity) Albuterol use two times or less a week on average (not counting use with activity) Cough interfering with sleep two times or less a month Oral steroids no more than once a year No hospitalizations   Follow up in 6 months or sooner if needed. Recommend getting the flu vaccine at your PCP's office every fall.  Reducing Pollen Exposure Pollen seasons: trees (spring), grass (summer) and ragweed/weeds (fall). Keep windows closed in your home and car to lower pollen exposure.  Install air  conditioning in the bedroom and throughout the house if possible.  Avoid going out in dry windy days - especially early morning. Pollen counts are highest between 5 - 10 AM and on dry, hot and windy days.  Save outside activities for late afternoon or after a heavy rain, when pollen levels are lower.  Avoid mowing of grass if you have grass pollen allergy. Be aware that pollen can also be transported indoors on people and pets.  Dry your clothes in an automatic dryer rather than hanging them outside where they might collect pollen.  Rinse hair and eyes before bedtime. Control of House Dust Mite Allergen Dust mite allergens are a common trigger of allergy and asthma symptoms. While they can be found throughout the house, these microscopic creatures thrive in warm, humid environments such as bedding, upholstered furniture and carpeting. Because so much time is spent in the bedroom, it is essential to reduce mite levels there.  Encase pillows, mattresses, and box springs in special allergen-proof fabric covers or airtight, zippered plastic covers.  Bedding should be washed weekly in hot water (130 F) and dried in a hot dryer. Allergen-proof covers are available for comforters and pillows that can't be regularly washed.  Wash the allergy-proof covers every few months. Minimize clutter in the bedroom. Keep pets out of the bedroom.  Keep humidity less than 50% by using a dehumidifier or air conditioning. You can buy a humidity measuring device called a hygrometer to monitor this.  If possible, replace carpets with hardwood, linoleum, or washable area rugs. If that's not possible, vacuum frequently with a vacuum that has a HEPA filter. Remove all upholstered furniture and non-washable window drapes from the bedroom. Remove  all non-washable stuffed toys from the bedroom.  Wash stuffed toys weekly. Pet Allergen Avoidance: Contrary to popular opinion, there are no "hypoallergenic" breeds of dogs or cats.  That is because people are not allergic to an animal's hair, but to an allergen found in the animal's saliva, dander (dead skin flakes) or urine. Pet allergy symptoms typically occur within minutes. For some people, symptoms can build up and become most severe 8 to 12 hours after contact with the animal. People with severe allergies can experience reactions in public places if dander has been transported on the pet owners' clothing. Keeping an animal outdoors is only a partial solution, since homes with pets in the yard still have higher concentrations of animal allergens. Before getting a pet, ask your allergist to determine if you are allergic to animals. If your pet is already considered part of your family, try to minimize contact and keep the pet out of the bedroom and other rooms where you spend a great deal of time. As with dust mites, vacuum carpets often or replace carpet with a hardwood floor, tile or linoleum. High-efficiency particulate air (HEPA) cleaners can reduce allergen levels over time. While dander and saliva are the source of cat and dog allergens, urine is the source of allergens from rabbits, hamsters, mice and [place] pigs; so ask a non-allergic family member to clean the animal's cage. If you have a pet allergy, talk to your allergist about the potential for allergy immunotherapy (allergy shots). This strategy can often provide long-term relief.

## 2022-05-18 NOTE — Patient Instructions (Signed)

## 2022-05-22 ENCOUNTER — Encounter: Payer: Self-pay | Admitting: Physical Therapy

## 2022-05-22 ENCOUNTER — Ambulatory Visit: Payer: Medicaid Other | Admitting: Physical Therapy

## 2022-05-22 DIAGNOSIS — M545 Low back pain, unspecified: Secondary | ICD-10-CM

## 2022-05-22 DIAGNOSIS — G8929 Other chronic pain: Secondary | ICD-10-CM | POA: Diagnosis not present

## 2022-05-22 DIAGNOSIS — R293 Abnormal posture: Secondary | ICD-10-CM | POA: Diagnosis not present

## 2022-05-22 NOTE — Therapy (Signed)
OUTPATIENT PHYSICAL THERAPY THORACOLUMBAR TREATMENT   Patient Name: Cassandra Tucker MRN: [id number] DOB:[birth year], 31 y.o., female Today's Date: 05/22/2022  END OF SESSION:  PT End of Session - 05/22/22 0834     Visit Number 4    Number of Visits 6    Date for PT Re-Evaluation 06/09/22    PT Start Time 0816    PT Stop Time 0858    PT Time Calculation (min) 42 min    Activity Tolerance Patient tolerated treatment well    Behavior During Therapy [facility] for tasks assessed/performed            Past Medical History:  Diagnosis Date   Ankle fracture, left 2000   Anxiety    Arthritis    left wrist and left ankle   Asthma    Back pain    Back pain    Bronchitis    Depression    Diabetes ([place]) 01/11/2016   type II  history of   Family history of adverse reaction to anesthesia    gmother had problems with N/V    GERD (gastroesophageal reflux disease)    Headache    High cholesterol    History of kidney stones    HLD (hyperlipidemia)    HSV-2 infection    Hypertension 2013   Intracranial hypertension    psuedotumor cebrum   Joint pain    Joint pain    Kienbock's disease    Leg edema    OSA (obstructive sleep apnea)    cpap - does not know settings    Papilledema    Prediabetes    Pregnancy induced hypertension    Pseudotumor cerebri syndrome    RLS (restless legs syndrome)    Sinus tachycardia    SOB (shortness of breath)    Past Surgical History:  Procedure Laterality Date   CESAREAN SECTION N/A 07/17/2019   Procedure: CESAREAN SECTION;  Surgeon: Everett Graff, MD;  Location: [facility];  Service: Obstetrics;  Laterality: N/A;   FRACTURE SURGERY  2005   ankle   FRACTURE SURGERY  2013   wrist (deteriorating bone)   LAPAROSCOPIC GASTRIC SLEEVE RESECTION N/A 05/08/2017   Procedure: LAPAROSCOPIC GASTRIC SLEEVE RESECTION;  Surgeon: Clovis Riley, MD;  Location: [facility] ORS;  Service: General;  Laterality: N/A;   LAPAROSCOPIC GASTRIC SLEEVE RESECTION  05/08/2017    UPPER GI ENDOSCOPY  05/08/2017   Procedure: UPPER GI ENDOSCOPY;  Surgeon: Clovis Riley, MD;  Location: [facility] ORS;  Service: General;;   urethra stretch     WRIST SURGERY  2015   Patient Active Problem List   Diagnosis Date Noted   Back pain 04/11/2022   Panniculitis 04/11/2022   GAD (generalized anxiety disorder) 04/21/2021   Depression, recurrent ([place]) 04/21/2021   History of cesarean section 07/24/2020   History of gestational hypertension 07/24/2020   Severe persistent asthma without complication 0000000   Seasonal and perennial allergic rhinoconjunctivitis 01/01/2020   BMI 45.0-49.9, adult ([place]) 06/29/2019   Anemia 05/21/2019   History of depression 01/28/2019   History of bariatric surgery 01/13/2019   Genital herpes simplex 01/13/2019   Vitamin D deficiency 02/26/2017   Class 3 obesity with serious comorbidity and body mass index (BMI) of 60.0 to 69.9 in adult 02/26/2017   Controlled type 2 diabetes mellitus without complication, without long-term current use of insulin ([place]) 04/21/2016   Gastroesophageal reflux disease without esophagitis 04/21/2016   Pseudotumor cerebri syndrome 11/29/2015   Super obesity 11/29/2015   OSA  on CPAP 11/29/2015   RLS (restless legs syndrome) 11/29/2015   Papilledema 06/16/2015   Kienbock disease, 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   Hyperlipidemia associated with type 2 diabetes mellitus ([place]) 01/21/2014   Sinus tachycardia 12/04/2011   Hypertension associated with type 2 diabetes mellitus ([place]) 12/04/2011   Morbid obesity ([place]) 11/07/2011   PCP: Baruch Gouty, FNP  REFERRING PROVIDER: Wallace Going, DO   REFERRING DIAG: Chronic bilateral thoracic back pain; Panniculitis; Pseudotumor cerebri syndrome; Morbid obesity; History of depression   Rationale for Evaluation and Treatment: Rehabilitation  THERAPY DIAG:  Chronic bilateral low back pain without sciatica  Abnormal posture  ONSET DATE: about 1 year ago  SUBJECTIVE:                                                                                                                                                                                            SUBJECTIVE STATEMENT: More soreness today after doing a lot of standing over the weekend. Has light tingling this morning.  PERTINENT HISTORY:  Allergies, hypertension, type 2 diabetes, obesity, depression, anxiety, and arthritis  PAIN:  Are you having pain? Yes: NPRS scale: 5-6/10 Pain location: low back pain Pain description: primarily sore, but has experienced numbness, tingling, sharp, and shooting pain when aggravated Aggravating factors: standing (about 20-30 minutes), sitting, sit to stand transfers Relieving factors: none found   PRECAUTIONS: None  PATIENT GOALS: be able to stand longer to do her daily activities such as cooking, cleaning, and other household activities  NEXT MD VISIT: to be scheduled after therapy   OBJECTIVE: all objective measures were assessed at her initial evaluation on 05/09/22 unless otherwise noted  PATIENT SURVEYS:  Modified Oswestry 34/100   LUMBAR ROM:   AROM eval  Flexion 64; familiar pain when returning to neutral   Extension 16; familiar pain returning to neutral   Right lateral flexion 24  Left lateral flexion 14; familiar pain in right low back and hip  Right rotation 25% limited; reduced pain  Left rotation 25% limited   (Blank rows = not tested)  LOWER EXTREMITY ROM: WFL for activities assessed  LOWER EXTREMITY MMT:    MMT Right eval Left eval  Hip flexion 4/5 4+/5  Hip extension    Hip abduction    Hip adduction    Hip internal rotation    Hip external rotation    Knee flexion 5/5 4+/5  Knee extension 5/5 5/5  Ankle dorsiflexion 4+/5 4+/5  Ankle plantarflexion    Ankle inversion    Ankle eversion     (Blank rows = not tested)  TODAY'S TREATMENT:  DATE:                                     12/11 EXERCISE LOG  Exercise Repetitions and Resistance Comments  Nustep  L4 x 16 minutes   Shoulder extension  Blue XTS x20 reps   Isometric core press 20 reps 5 sec holds   Figure 4 stretch BLE 3x30 sec each   SKTC BLE 3x30 sec each   Bridge 20 reps   marching 20 reps   POE X2 min   LTR X5 reps 10 sec    Blank cell = exercise not performed today   PATIENT EDUCATION:  Education details: Scientific laboratory technician Person educated: Patient Education method: Explanation, handout Education comprehension: verbalized understanding  HOME EXERCISE PROGRAM:  ASSESSMENT:  CLINICAL IMPRESSION: Patient presented in clinic with greater low back soreness after prolonged standing over the weekend. Patient guided through therex with emphasis on stretching with no complaints of any increased LBP. Patient also educated regarding POE purpose and rationale behind the positioning if LE symptoms are present. No complaints following end of session.  OBJECTIVE IMPAIRMENTS: decreased activity tolerance, decreased mobility, difficulty walking, decreased ROM, decreased strength, hypomobility, impaired tone, postural dysfunction, and pain.   ACTIVITY LIMITATIONS: lifting, bending, standing, transfers, dressing, and locomotion level  PARTICIPATION LIMITATIONS: meal prep, cleaning, laundry, shopping, and community activity  PERSONAL FACTORS: Time since onset of injury/illness/exacerbation and 3+ comorbidities: Allergies, hypertension, type 2 diabetes, obesity, depression, anxiety, and arthritis  are also affecting patient's functional outcome.   REHAB POTENTIAL: Fair    CLINICAL DECISION MAKING: Evolving/moderate complexity  EVALUATION COMPLEXITY: Moderate  GOALS: Goals reviewed with patient? Yes  LONG TERM GOALS: Target date: 05/30/22  Patient will be independent with her HEP. Baseline:  Goal status: On-going  2.  Patient will be able  to complete her daily activities without her familiar pain exceeding 6/10. Baseline:  Goal status: On-going  3.  Patient will be able to stand for at least 45 minutes without being limited by her familiar low back pain for improved function with her daily activities. Baseline:  Goal status: On-going  4.  Patient will improve her ODI score to at least 24/100 or less.  Baseline:  Goal status: On-going  PLAN:  PT FREQUENCY: 2x/week  PT DURATION: 3 weeks  PLANNED INTERVENTIONS: Therapeutic exercises, Therapeutic activity, Neuromuscular re-education, Gait training, Patient/Family education, Self Care, Joint mobilization, Spinal mobilization, Cryotherapy, Moist heat, Manual therapy, and Re-evaluation.  PLAN FOR NEXT SESSION: NuStep, lumbar strengthening, lumbar stabilization, and manual therapy  Marvell Fuller, PTA 05/22/2022, 9:09 AM

## 2022-05-26 ENCOUNTER — Ambulatory Visit: Payer: Medicaid Other | Admitting: Physical Therapy

## 2022-05-26 ENCOUNTER — Encounter: Payer: Self-pay | Admitting: Physical Therapy

## 2022-05-26 DIAGNOSIS — M545 Low back pain, unspecified: Secondary | ICD-10-CM

## 2022-05-26 DIAGNOSIS — R293 Abnormal posture: Secondary | ICD-10-CM | POA: Diagnosis not present

## 2022-05-26 DIAGNOSIS — G8929 Other chronic pain: Secondary | ICD-10-CM | POA: Diagnosis not present

## 2022-05-26 NOTE — Therapy (Signed)
OUTPATIENT PHYSICAL THERAPY THORACOLUMBAR TREATMENT   Patient Name: Cassandra Tucker MRN: [id number] DOB:1991/01/04, 31 y.o., female Today's Date: 05/26/2022  END OF SESSION:  PT End of Session - 05/26/22 1033     Visit Number 5    Number of Visits 6    Date for PT Re-Evaluation 06/09/22    PT Start Time 1032    PT Stop Time 1115    PT Time Calculation (min) 43 min    Activity Tolerance Patient tolerated treatment well    Behavior During Therapy [facility] for tasks assessed/performed            Past Medical History:  Diagnosis Date   Ankle fracture, left 2000   Anxiety    Arthritis    left wrist and left ankle   Asthma    Back pain    Back pain    Bronchitis    Depression    Diabetes ([place]) 01/11/2016   type II  history of   Family history of adverse reaction to anesthesia    gmother had problems with N/V    GERD (gastroesophageal reflux disease)    Headache    High cholesterol    History of kidney stones    HLD (hyperlipidemia)    HSV-2 infection    Hypertension 2013   Intracranial hypertension    psuedotumor cebrum   Joint pain    Joint pain    Kienbock's disease    Leg edema    OSA (obstructive sleep apnea)    cpap - does not know settings    Papilledema    Prediabetes    Pregnancy induced hypertension    Pseudotumor cerebri syndrome    RLS (restless legs syndrome)    Sinus tachycardia    SOB (shortness of breath)    Past Surgical History:  Procedure Laterality Date   CESAREAN SECTION N/A 07/17/2019   Procedure: CESAREAN SECTION;  Surgeon: Everett Graff, MD;  Location: [facility] LD [facility]S;  Service: Obstetrics;  Laterality: N/A;   FRACTURE SURGERY  2005   ankle   FRACTURE SURGERY  2013   wrist (deteriorating bone)   LAPAROSCOPIC GASTRIC SLEEVE RESECTION N/A 05/08/2017   Procedure: LAPAROSCOPIC GASTRIC SLEEVE RESECTION;  Surgeon: Clovis Riley, MD;  Location: [facility];  Service: General;  Laterality: N/A;   LAPAROSCOPIC GASTRIC SLEEVE RESECTION  05/08/2017    UPPER GI ENDOSCOPY  05/08/2017   Procedure: UPPER GI ENDOSCOPY;  Surgeon: Clovis Riley, MD;  Location: [facility];  Service: General;;   urethra stretch     WRIST SURGERY  2015   Patient Active Problem List   Diagnosis Date Noted   Back pain 04/11/2022   Panniculitis 04/11/2022   GAD (generalized anxiety disorder) 04/21/2021   Depression, recurrent ([place]) 04/21/2021   History of cesarean section 07/24/2020   History of gestational hypertension 07/24/2020   Severe persistent asthma without complication 0000000   Seasonal and perennial allergic rhinoconjunctivitis 01/01/2020   BMI 45.0-49.9, adult ([place]) 06/29/2019   Anemia 05/21/2019   History of depression 01/28/2019   History of bariatric surgery 01/13/2019   Genital herpes simplex 01/13/2019   Vitamin D deficiency 02/26/2017   Class 3 obesity with serious comorbidity and body mass index (BMI) of 60.0 to 69.9 in adult 02/26/2017   Controlled type 2 diabetes mellitus without complication, without long-term current use of insulin ([place]) 04/21/2016   Gastroesophageal reflux disease without esophagitis 04/21/2016   Pseudotumor cerebri syndrome 11/29/2015   Super obesity 11/29/2015   OSA  on CPAP 11/29/2015   RLS (restless legs syndrome) 11/29/2015   Papilledema 06/16/2015   Kienbock disease, 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   Hyperlipidemia associated with type 2 diabetes mellitus ([place]) 01/21/2014   Sinus tachycardia 12/04/2011   Hypertension associated with type 2 diabetes mellitus ([place]) 12/04/2011   Morbid obesity ([place]) 11/07/2011   PCP: Baruch Gouty, FNP  REFERRING PROVIDER: Wallace Going, DO   REFERRING DIAG: Chronic bilateral thoracic back pain; Panniculitis; Pseudotumor cerebri syndrome; Morbid obesity; History of depression   Rationale for Evaluation and Treatment: Rehabilitation  THERAPY DIAG:  Chronic bilateral low back pain without sciatica  Abnormal posture  ONSET DATE: about 1 year ago  SUBJECTIVE:                                                                                                                                                                                            SUBJECTIVE STATEMENT: Reports having pain farther up lumbar spine. Had increased LBP for three days.   PERTINENT HISTORY:  Allergies, hypertension, type 2 diabetes, obesity, depression, anxiety, and arthritis  PAIN:  Are you having pain? Yes: NPRS scale: 5-6/10 Pain location: low back pain Pain description: primarily sore, but has experienced numbness, tingling, sharp, and shooting pain when aggravated Aggravating factors: standing (about 20-30 minutes), sitting, sit to stand transfers Relieving factors: none found   PRECAUTIONS: None  PATIENT GOALS: be able to stand longer to do her daily activities such as cooking, cleaning, and other household activities  NEXT MD VISIT: to be scheduled after therapy   OBJECTIVE: all objective measures were assessed at her initial evaluation on 05/09/22 unless otherwise noted  PATIENT SURVEYS:  Modified Oswestry 34/100   LUMBAR ROM:   AROM eval  Flexion 64; familiar pain when returning to neutral   Extension 16; familiar pain returning to neutral   Right lateral flexion 24  Left lateral flexion 14; familiar pain in right low back and hip  Right rotation 25% limited; reduced pain  Left rotation 25% limited   (Blank rows = not tested)  LOWER EXTREMITY ROM: WFL for activities assessed  LOWER EXTREMITY MMT:    MMT Right eval Left eval  Hip flexion 4/5 4+/5  Hip extension    Hip abduction    Hip adduction    Hip internal rotation    Hip external rotation    Knee flexion 5/5 4+/5  Knee extension 5/5 5/5  Ankle dorsiflexion 4+/5 4+/5  Ankle plantarflexion    Ankle inversion    Ankle eversion     (Blank rows = not tested)  TODAY'S TREATMENT:  DATE:                                     12/15 EXERCISE LOG  Exercise Repetitions and Resistance Comments  Nustep  L4 x 15 minutes   Shoulder extension  Blue XTS x 30 reps   Chop wood Blue XTS x 20 reps bilaterally   Bridge 20 reps   marching 20 reps    Blank cell = exercise not performed today   PATIENT EDUCATION:  Education details: Scientific laboratory technician Person educated: Patient Education method: Explanation, handout Education comprehension: verbalized understanding  HOME EXERCISE PROGRAM:  ASSESSMENT:  CLINICAL IMPRESSION: Patient presented in clinic with reports of increased pain since last PT and pain moving more superiorly. Patient able to complete all therex with no complaints of increased pain. More core focused therex completed today.  OBJECTIVE IMPAIRMENTS: decreased activity tolerance, decreased mobility, difficulty walking, decreased ROM, decreased strength, hypomobility, impaired tone, postural dysfunction, and pain.   ACTIVITY LIMITATIONS: lifting, bending, standing, transfers, dressing, and locomotion level  PARTICIPATION LIMITATIONS: meal prep, cleaning, laundry, shopping, and community activity  PERSONAL FACTORS: Time since onset of injury/illness/exacerbation and 3+ comorbidities: Allergies, hypertension, type 2 diabetes, obesity, depression, anxiety, and arthritis  are also affecting patient's functional outcome.   REHAB POTENTIAL: Fair    CLINICAL DECISION MAKING: Evolving/moderate complexity  EVALUATION COMPLEXITY: Moderate  GOALS: Goals reviewed with patient? Yes  LONG TERM GOALS: Target date: 05/30/22  Patient will be independent with her HEP. Baseline:  Goal status: On-going  2.  Patient will be able to complete her daily activities without her familiar pain exceeding 6/10. Baseline:  Goal status: On-going  3.  Patient will be able to stand for at least 45 minutes without being limited by her familiar low back pain for improved function  with her daily activities. Baseline:  Goal status: On-going  4.  Patient will improve her ODI score to at least 24/100 or less.  Baseline:  Goal status: On-going  PLAN:  PT FREQUENCY: 2x/week  PT DURATION: 3 weeks  PLANNED INTERVENTIONS: Therapeutic exercises, Therapeutic activity, Neuromuscular re-education, Gait training, Patient/Family education, Self Care, Joint mobilization, Spinal mobilization, Cryotherapy, Moist heat, Manual therapy, and Re-evaluation.  PLAN FOR NEXT SESSION: NuStep, lumbar strengthening, lumbar stabilization, and manual therapy  Marvell Fuller, PTA 05/26/2022, 12:14 PM

## 2022-05-29 ENCOUNTER — Ambulatory Visit: Payer: Medicaid Other | Admitting: Physical Therapy

## 2022-05-29 ENCOUNTER — Encounter: Payer: Self-pay | Admitting: Physical Therapy

## 2022-05-29 DIAGNOSIS — R293 Abnormal posture: Secondary | ICD-10-CM

## 2022-05-29 DIAGNOSIS — M545 Low back pain, unspecified: Secondary | ICD-10-CM

## 2022-05-29 DIAGNOSIS — G8929 Other chronic pain: Secondary | ICD-10-CM | POA: Diagnosis not present

## 2022-05-29 NOTE — Therapy (Signed)
OUTPATIENT PHYSICAL THERAPY THORACOLUMBAR TREATMENT   Patient Name: Cassandra Tucker MRN: [id number] DOB:04/11/1991, 31 y.o., female Today's Date: 05/29/2022  END OF SESSION:  PT End of Session - 05/29/22 0818     Visit Number 6    Number of Visits 6    Date for PT Re-Evaluation 06/09/22    PT Start Time 0816    PT Stop Time 4166    PT Time Calculation (min) 41 min    Activity Tolerance Patient tolerated treatment well    Behavior During Therapy [facility] for tasks assessed/performed            Past Medical History:  Diagnosis Date   Ankle fracture, left 2000   Anxiety    Arthritis    left wrist and left ankle   Asthma    Back pain    Back pain    Bronchitis    Depression    Diabetes ([place]) 01/11/2016   type II  history of   Family history of adverse reaction to anesthesia    gmother had problems with N/V    GERD (gastroesophageal reflux disease)    Headache    High cholesterol    History of kidney stones    HLD (hyperlipidemia)    HSV-2 infection    Hypertension 2013   Intracranial hypertension    psuedotumor cebrum   Joint pain    Joint pain    Kienbock's disease    Leg edema    OSA (obstructive sleep apnea)    cpap - does not know settings    Papilledema    Prediabetes    Pregnancy induced hypertension    Pseudotumor cerebri syndrome    RLS (restless legs syndrome)    Sinus tachycardia    SOB (shortness of breath)    Past Surgical History:  Procedure Laterality Date   CESAREAN SECTION N/A 07/17/2019   Procedure: CESAREAN SECTION;  Surgeon: Everett Graff, MD;  Location: [facility] LD [facility]S;  Service: Obstetrics;  Laterality: N/A;   FRACTURE SURGERY  2005   ankle   FRACTURE SURGERY  2013   wrist (deteriorating bone)   LAPAROSCOPIC GASTRIC SLEEVE RESECTION N/A 05/08/2017   Procedure: LAPAROSCOPIC GASTRIC SLEEVE RESECTION;  Surgeon: Clovis Riley, MD;  Location: [facility] ORS;  Service: General;  Laterality: N/A;   LAPAROSCOPIC GASTRIC SLEEVE RESECTION  05/08/2017    UPPER GI ENDOSCOPY  05/08/2017   Procedure: UPPER GI ENDOSCOPY;  Surgeon: Clovis Riley, MD;  Location: [facility] ORS;  Service: General;;   urethra stretch     WRIST SURGERY  2015   Patient Active Problem List   Diagnosis Date Noted   Back pain 04/11/2022   Panniculitis 04/11/2022   GAD (generalized anxiety disorder) 04/21/2021   Depression, recurrent ([place]) 04/21/2021   History of cesarean section 07/24/2020   History of gestational hypertension 07/24/2020   Severe persistent asthma without complication 12/10/1599   Seasonal and perennial allergic rhinoconjunctivitis 01/01/2020   BMI 45.0-49.9, adult ([place]) 06/29/2019   Anemia 05/21/2019   History of depression 01/28/2019   History of bariatric surgery 01/13/2019   Genital herpes simplex 01/13/2019   Vitamin D deficiency 02/26/2017   Class 3 obesity with serious comorbidity and body mass index (BMI) of 60.0 to 69.9 in adult 02/26/2017   Controlled type 2 diabetes mellitus without complication, without long-term current use of insulin ([place]) 04/21/2016   Gastroesophageal reflux disease without esophagitis 04/21/2016   Pseudotumor cerebri syndrome 11/29/2015   Super obesity 11/29/2015   OSA  on CPAP 11/29/2015   RLS (restless legs syndrome) 11/29/2015   Papilledema 06/16/2015   Kienbock disease, 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   Hyperlipidemia associated with type 2 diabetes mellitus ([place]) 01/21/2014   Sinus tachycardia 12/04/2011   Hypertension associated with type 2 diabetes mellitus ([place]) 12/04/2011   Morbid obesity ([place]) 11/07/2011   PCP: Baruch Gouty, FNP  REFERRING PROVIDER: Wallace Going, DO   REFERRING DIAG: Chronic bilateral thoracic back pain; Panniculitis; Pseudotumor cerebri syndrome; Morbid obesity; History of depression   Rationale for Evaluation and Treatment: Rehabilitation  THERAPY DIAG:  Chronic bilateral low back pain without sciatica  Abnormal posture  ONSET DATE: about 1 year ago  SUBJECTIVE:                                                                                                                                                                                            SUBJECTIVE STATEMENT: Mild pain reported.   PERTINENT HISTORY:  Allergies, hypertension, type 2 diabetes, obesity, depression, anxiety, and arthritis  PAIN:  Are you having pain? Yes: NPRS scale: 3/10 Pain location: low back pain Pain description: primarily sore, but has experienced numbness, tingling, sharp, and shooting pain when aggravated Aggravating factors: standing (about 20-30 minutes), sitting, sit to stand transfers Relieving factors: none found   PRECAUTIONS: None  PATIENT GOALS: be able to stand longer to do her daily activities such as cooking, cleaning, and other household activities  NEXT MD VISIT: to be scheduled after therapy   OBJECTIVE: all objective measures were assessed at her initial evaluation on 05/09/22 unless otherwise noted  PATIENT SURVEYS:  Modified Oswestry 34/100   LUMBAR ROM:   AROM eval  Flexion 64; familiar pain when returning to neutral   Extension 16; familiar pain returning to neutral   Right lateral flexion 24  Left lateral flexion 14; familiar pain in right low back and hip  Right rotation 25% limited; reduced pain  Left rotation 25% limited   (Blank rows = not tested)  LOWER EXTREMITY ROM: WFL for activities assessed  LOWER EXTREMITY MMT:    MMT Right eval Left eval  Hip flexion 4/5 4+/5  Hip extension    Hip abduction    Hip adduction    Hip internal rotation    Hip external rotation    Knee flexion 5/5 4+/5  Knee extension 5/5 5/5  Ankle dorsiflexion 4+/5 4+/5  Ankle plantarflexion    Ankle inversion    Ankle eversion     (Blank rows = not tested)  TODAY'S TREATMENT:  DATE:                                     12/18 EXERCISE  LOG  Exercise Repetitions and Resistance Comments  Nustep  L4 x 18 minutes   Shoulder extension  Blue XTS x 30 reps   Chop wood Blue XTS x 20 reps bilaterally   Bridge 20 reps   marching 20 reps    Blank cell = exercise not performed today   PATIENT EDUCATION:  Education details: Statistician Person educated: Patient Education method: Explanation, handout Education comprehension: verbalized understanding  HOME EXERCISE PROGRAM:  ASSESSMENT:  CLINICAL IMPRESSION: Patient presented in clinic with reports of increased pain since last PT and pain moving more superiorly. Patient able to complete all therex with no complaints of increased pain. More core focused therex completed today.  OBJECTIVE IMPAIRMENTS: decreased activity tolerance, decreased mobility, difficulty walking, decreased ROM, decreased strength, hypomobility, impaired tone, postural dysfunction, and pain.   ACTIVITY LIMITATIONS: lifting, bending, standing, transfers, dressing, and locomotion level  PARTICIPATION LIMITATIONS: meal prep, cleaning, laundry, shopping, and community activity  PERSONAL FACTORS: Time since onset of injury/illness/exacerbation and 3+ comorbidities: Allergies, hypertension, type 2 diabetes, obesity, depression, anxiety, and arthritis  are also affecting patient's functional outcome.   REHAB POTENTIAL: Fair    CLINICAL DECISION MAKING: Evolving/moderate complexity  EVALUATION COMPLEXITY: Moderate  GOALS: Goals reviewed with patient? Yes  LONG TERM GOALS: Target date: 05/30/22  Patient will be independent with her HEP. Baseline:  Goal status: MET  2.  Patient will be able to complete her daily activities without her familiar pain exceeding 6/10. Baseline:  Goal status: MET  3.  Patient will be able to stand for at least 45 minutes without being limited by her familiar low back pain for improved function with her daily activities. Baseline:  Goal status: NOT MET  4.   Patient will improve her ODI score to at least 24/100 or less.  Baseline:  Goal status: NOT MET  PLAN:  PT FREQUENCY: 2x/week  PT DURATION: 3 weeks  PLANNED INTERVENTIONS: Therapeutic exercises, Therapeutic activity, Neuromuscular re-education, Gait training, Patient/Family education, Self Care, Joint mobilization, Spinal mobilization, Cryotherapy, Moist heat, Manual therapy, and Re-evaluation.  PLAN FOR NEXT SESSION: NuStep, lumbar strengthening, lumbar stabilization, and manual therapy  Standley Brooking, PTA 05/29/2022, 9:30 AM

## 2022-05-29 NOTE — Therapy (Addendum)
OUTPATIENT PHYSICAL THERAPY THORACOLUMBAR TREATMENT   Patient Name: Cassandra Tucker MRN: [id number] DOB:12-11-1990, 31 y.o., female Today's Date: 05/29/2022  END OF SESSION:  PT End of Session - 05/29/22 0818     Visit Number 6    Number of Visits 6    Date for PT Re-Evaluation 06/09/22    PT Start Time 0816    PT Stop Time 1700    PT Time Calculation (min) 41 min    Activity Tolerance Patient tolerated treatment well    Behavior During Therapy [facility] for tasks assessed/performed            Past Medical History:  Diagnosis Date   Ankle fracture, left 2000   Anxiety    Arthritis    left wrist and left ankle   Asthma    Back pain    Back pain    Bronchitis    Depression    Diabetes ([place]) 01/11/2016   type II  history of   Family history of adverse reaction to anesthesia    gmother had problems with N/V    GERD (gastroesophageal reflux disease)    Headache    High cholesterol    History of kidney stones    HLD (hyperlipidemia)    HSV-2 infection    Hypertension 2013   Intracranial hypertension    psuedotumor cebrum   Joint pain    Joint pain    Kienbock's disease    Leg edema    OSA (obstructive sleep apnea)    cpap - does not know settings    Papilledema    Prediabetes    Pregnancy induced hypertension    Pseudotumor cerebri syndrome    RLS (restless legs syndrome)    Sinus tachycardia    SOB (shortness of breath)    Past Surgical History:  Procedure Laterality Date   CESAREAN SECTION N/A 07/17/2019   Procedure: CESAREAN SECTION;  Surgeon: Everett Graff, MD;  Location: [facility] LD [facility]S;  Service: Obstetrics;  Laterality: N/A;   FRACTURE SURGERY  2005   ankle   FRACTURE SURGERY  2013   wrist (deteriorating bone)   LAPAROSCOPIC GASTRIC SLEEVE RESECTION N/A 05/08/2017   Procedure: LAPAROSCOPIC GASTRIC SLEEVE RESECTION;  Surgeon: Clovis Riley, MD;  Location: [facility];  Service: General;  Laterality: N/A;   LAPAROSCOPIC GASTRIC SLEEVE RESECTION  05/08/2017    UPPER GI ENDOSCOPY  05/08/2017   Procedure: UPPER GI ENDOSCOPY;  Surgeon: Clovis Riley, MD;  Location: [facility];  Service: General;;   urethra stretch     WRIST SURGERY  2015   Patient Active Problem List   Diagnosis Date Noted   Back pain 04/11/2022   Panniculitis 04/11/2022   GAD (generalized anxiety disorder) 04/21/2021   Depression, recurrent ([place]) 04/21/2021   History of cesarean section 07/24/2020   History of gestational hypertension 07/24/2020   Severe persistent asthma without complication 17/49/4496   Seasonal and perennial allergic rhinoconjunctivitis 01/01/2020   BMI 45.0-49.9, adult ([place]) 06/29/2019   Anemia 05/21/2019   History of depression 01/28/2019   History of bariatric surgery 01/13/2019   Genital herpes simplex 01/13/2019   Vitamin D deficiency 02/26/2017   Class 3 obesity with serious comorbidity and body mass index (BMI) of 60.0 to 69.9 in adult 02/26/2017   Controlled type 2 diabetes mellitus without complication, without long-term current use of insulin ([place]) 04/21/2016   Gastroesophageal reflux disease without esophagitis 04/21/2016   Pseudotumor cerebri syndrome 11/29/2015   Super obesity 11/29/2015   OSA  on CPAP 11/29/2015   RLS (restless legs syndrome) 11/29/2015   Papilledema 06/16/2015   Kienbock disease, 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   Hyperlipidemia associated with type 2 diabetes mellitus ([place]) 01/21/2014   Sinus tachycardia 12/04/2011   Hypertension associated with type 2 diabetes mellitus ([place]) 12/04/2011   Morbid obesity ([place]) 11/07/2011   PCP: Baruch Gouty, FNP  REFERRING PROVIDER: Wallace Going, DO   REFERRING DIAG: Chronic bilateral thoracic back pain; Panniculitis; Pseudotumor cerebri syndrome; Morbid obesity; History of depression   Rationale for Evaluation and Treatment: Rehabilitation  THERAPY DIAG:  Chronic bilateral low back pain without sciatica  Abnormal posture  ONSET DATE: about 1 year ago  SUBJECTIVE:                                                                                                                                                                                            SUBJECTIVE STATEMENT: Mild pain reported.   PERTINENT HISTORY:  Allergies, hypertension, type 2 diabetes, obesity, depression, anxiety, and arthritis  PAIN:  Are you having pain? Yes: NPRS scale: 3/10 Pain location: low back pain Pain description: primarily sore, but has experienced numbness, tingling, sharp, and shooting pain when aggravated Aggravating factors: standing (about 20-30 minutes), sitting, sit to stand transfers Relieving factors: none found   PRECAUTIONS: None  PATIENT GOALS: be able to stand longer to do her daily activities such as cooking, cleaning, and other household activities  NEXT MD VISIT: to be scheduled after therapy   OBJECTIVE: all objective measures were assessed at her initial evaluation on 05/09/22 unless otherwise noted  PATIENT SURVEYS:  Modified Oswestry 34/100   LUMBAR ROM:   AROM eval  Flexion 64; familiar pain when returning to neutral   Extension 16; familiar pain returning to neutral   Right lateral flexion 24  Left lateral flexion 14; familiar pain in right low back and hip  Right rotation 25% limited; reduced pain  Left rotation 25% limited   (Blank rows = not tested)  LOWER EXTREMITY ROM: WFL for activities assessed  LOWER EXTREMITY MMT:    MMT Right eval Left eval  Hip flexion 4/5 4+/5  Hip extension    Hip abduction    Hip adduction    Hip internal rotation    Hip external rotation    Knee flexion 5/5 4+/5  Knee extension 5/5 5/5  Ankle dorsiflexion 4+/5 4+/5  Ankle plantarflexion    Ankle inversion    Ankle eversion     (Blank rows = not tested)  TODAY'S TREATMENT:  DATE:                                     12/18 EXERCISE  LOG  Exercise Repetitions and Resistance Comments  Nustep  L4 x 18 minutes   Shoulder extension  Blue XTS x 30 reps   Chop wood Blue XTS x 20 reps bilaterally   Arm raise at sink X15 reps 5 sec holds   Leg raise at sink X15 reps 5 sec holds   Opp arm/leg raise at sink X10 reps 5 sec holds    Blank cell = exercise not performed today   PATIENT EDUCATION:  Education details: Statistician Person educated: Patient Education method: Explanation, handout Education comprehension: verbalized understanding  HOME EXERCISE PROGRAM:  ASSESSMENT:  CLINICAL IMPRESSION: Patient presented in clinic with mild LBP today. Patient able to tolerate all therex with no complaints of pain. Patient progressed to standing/modified plank exercises for stability in which patient denied any LBP. Patient achieved 34/100 for Oswestry test. Patient still has some limitation with activity and ADLs due to LBP but able to complete. No pain upon end of session.  PHYSICAL THERAPY DISCHARGE SUMMARY  Visits from Start of Care: 6  Current functional level related to goals / functional outcomes: Patient was able to partially meet her goals for therapy.    Remaining deficits: Prolonged standing and perceived function    Education / Equipment: HEP    Patient agrees to discharge. Patient goals were partially met. Patient is being discharged due to  utilizing all of her authorized visits.   OBJECTIVE IMPAIRMENTS: decreased activity tolerance, decreased mobility, difficulty walking, decreased ROM, decreased strength, hypomobility, impaired tone, postural dysfunction, and pain.   ACTIVITY LIMITATIONS: lifting, bending, standing, transfers, dressing, and locomotion level  PARTICIPATION LIMITATIONS: meal prep, cleaning, laundry, shopping, and community activity  PERSONAL FACTORS: Time since onset of injury/illness/exacerbation and 3+ comorbidities: Allergies, hypertension, type 2 diabetes, obesity, depression,  anxiety, and arthritis  are also affecting patient's functional outcome.   REHAB POTENTIAL: Fair    CLINICAL DECISION MAKING: Evolving/moderate complexity  EVALUATION COMPLEXITY: Moderate  GOALS: Goals reviewed with patient? Yes  LONG TERM GOALS: Target date: 05/30/22  Patient will be independent with her HEP. Baseline:  Goal status: MET  2.  Patient will be able to complete her daily activities without her familiar pain exceeding 6/10. Baseline:  Goal status: MET  3.  Patient will be able to stand for at least 45 minutes without being limited by her familiar low back pain for improved function with her daily activities. Baseline:  Goal status: NOT MET  4.  Patient will improve her ODI score to at least 24/100 or less.  Baseline:  Goal status: NOT MET  PLAN:  PT FREQUENCY: 2x/week  PT DURATION: 3 weeks  PLANNED INTERVENTIONS: Therapeutic exercises, Therapeutic activity, Neuromuscular re-education, Gait training, Patient/Family education, Self Care, Joint mobilization, Spinal mobilization, Cryotherapy, Moist heat, Manual therapy, and Re-evaluation.  PLAN FOR NEXT SESSION: DC  Standley Brooking, PTA 05/29/2022, 9:10 AM

## 2022-05-31 ENCOUNTER — Encounter: Payer: Self-pay | Admitting: Plastic Surgery

## 2022-06-09 ENCOUNTER — Encounter: Payer: Self-pay | Admitting: Family Medicine

## 2022-06-13 ENCOUNTER — Other Ambulatory Visit: Payer: Self-pay | Admitting: Family Medicine

## 2022-06-13 DIAGNOSIS — E119 Type 2 diabetes mellitus without complications: Secondary | ICD-10-CM

## 2022-06-15 ENCOUNTER — Encounter: Payer: Self-pay | Admitting: Family Medicine

## 2022-06-16 ENCOUNTER — Other Ambulatory Visit: Payer: Self-pay | Admitting: Family Medicine

## 2022-06-16 ENCOUNTER — Ambulatory Visit: Payer: Medicaid Other | Admitting: Plastic Surgery

## 2022-06-16 VITALS — Ht 67.0 in | Wt 251.5 lb

## 2022-06-16 DIAGNOSIS — R21 Rash and other nonspecific skin eruption: Secondary | ICD-10-CM

## 2022-06-16 DIAGNOSIS — M793 Panniculitis, unspecified: Secondary | ICD-10-CM | POA: Diagnosis not present

## 2022-06-16 DIAGNOSIS — Z9884 Bariatric surgery status: Secondary | ICD-10-CM | POA: Diagnosis not present

## 2022-06-16 DIAGNOSIS — E119 Type 2 diabetes mellitus without complications: Secondary | ICD-10-CM | POA: Diagnosis not present

## 2022-06-16 DIAGNOSIS — M546 Pain in thoracic spine: Secondary | ICD-10-CM

## 2022-06-16 DIAGNOSIS — G8929 Other chronic pain: Secondary | ICD-10-CM

## 2022-06-16 NOTE — Progress Notes (Signed)
   Subjective:    Patient ID: Cassandra Tucker, female    DOB: [birth year], 32 y.o.   MRN: [id number]  The patient is a 32 year old female here for follow-up after being evaluated for a panniculectomy.  She finished her physical therapy 2 weeks ago.  She still has the back pain and rashes.  She had a gastric sleeve by Dr. Windle Guard in 2017 and done really well with her weight loss.  Since her last visit she lost an additional 10 pounds so she is now 5 feet 7 inches tall and 250 pounds.  She feels like she is at her happy weight and is likely not going to lose any more.      Review of Systems  Constitutional:  Positive for activity change. Negative for appetite change.  HENT: Negative.    Eyes: Negative.   Respiratory: Negative.  Negative for chest tightness and shortness of breath.   Cardiovascular: Negative.  Negative for leg swelling.  Gastrointestinal: Negative.   Endocrine: Negative.   Genitourinary: Negative.   Musculoskeletal:  Positive for back pain and neck pain.  Skin:  Positive for rash.       Objective:   Physical Exam Vitals and nursing note reviewed.  Constitutional:      Appearance: Normal appearance.  HENT:     Head: Normocephalic and atraumatic.  Cardiovascular:     Rate and Rhythm: Normal rate.     Pulses: Normal pulses.  Pulmonary:     Effort: Pulmonary effort is normal.  Abdominal:     General: There is no distension.     Palpations: Abdomen is soft. There is no mass.     Tenderness: There is no abdominal tenderness.  Musculoskeletal:        General: No swelling or deformity.  Skin:    General: Skin is warm.     Capillary Refill: Capillary refill takes less than 2 seconds.  Neurological:     Mental Status: She is alert and oriented to person, place, and time.  Psychiatric:        Mood and Affect: Mood normal.        Behavior: Behavior normal.        Thought Content: Thought content normal.        Judgment: Judgment normal.        Assessment &  Plan:     ICD-10-CM   1. Chronic bilateral thoracic back pain  M54.6    G89.29     2. Panniculitis  M79.3     3. Controlled type 2 diabetes mellitus without complication, without long-term current use of insulin ([facility])  E11.9     4. History of bariatric surgery  Z98.84        Patient is a good candidate for panniculectomy.  With the rashes and the skin breakdown I think she would benefit from excising this skin.  I let her know that any weight change will affect the and results.  She seems to be aware of that.  Pictures were obtained of the patient and placed in the chart with the patient's or guardian's permission.

## 2022-06-19 ENCOUNTER — Encounter: Payer: Self-pay | Admitting: Plastic Surgery

## 2022-06-20 ENCOUNTER — Encounter: Payer: Self-pay | Admitting: *Deleted

## 2022-07-03 ENCOUNTER — Other Ambulatory Visit: Payer: Self-pay | Admitting: Allergy

## 2022-07-11 ENCOUNTER — Ambulatory Visit: Payer: Medicaid Other | Admitting: Family Medicine

## 2022-07-11 ENCOUNTER — Encounter: Payer: Self-pay | Admitting: Family Medicine

## 2022-07-11 VITALS — BP 126/77 | HR 100 | Temp 98.1°F | Ht 67.0 in | Wt 262.6 lb

## 2022-07-11 DIAGNOSIS — R350 Frequency of micturition: Secondary | ICD-10-CM

## 2022-07-11 DIAGNOSIS — N3000 Acute cystitis without hematuria: Secondary | ICD-10-CM | POA: Diagnosis not present

## 2022-07-11 LAB — MICROSCOPIC EXAMINATION
RBC, Urine: NONE SEEN /hpf (ref 0–2)
Renal Epithel, UA: NONE SEEN /hpf
WBC, UA: NONE SEEN /hpf (ref 0–5)

## 2022-07-11 LAB — URINALYSIS, ROUTINE W REFLEX MICROSCOPIC
Bilirubin, UA: NEGATIVE
Glucose, UA: NEGATIVE
Ketones, UA: NEGATIVE
Leukocytes,UA: NEGATIVE
Nitrite, UA: POSITIVE — AB
Protein,UA: NEGATIVE
RBC, UA: NEGATIVE
Specific Gravity, UA: 1.025 (ref 1.005–1.030)
Urobilinogen, Ur: 0.2 mg/dL (ref 0.2–1.0)
pH, UA: 6 (ref 5.0–7.5)

## 2022-07-11 MED ORDER — SULFAMETHOXAZOLE-TRIMETHOPRIM 800-160 MG PO TABS: 1.0000 | ORAL_TABLET | Freq: Two times a day (BID) | ORAL | 0 refills | Status: AC

## 2022-07-11 NOTE — Progress Notes (Signed)
Subjective:  Patient ID: Cassandra Tucker, female    DOB: 12/21/1990, 32 y.o.   MRN: [id number]  Patient Care Team: Baruch Gouty, FNP as PCP - General (Family Medicine)   Chief Complaint:  Urinary Frequency (Patient states it has been going on for a few months/)   HPI: Cassandra Tucker is a 32 y.o. female presenting on 07/11/2022 for Urinary Frequency (Patient states it has been going on for a few months/)   Urinary Frequency  This is a recurrent problem. The current episode started more than 1 month ago. The problem occurs every urination. The problem has been waxing and waning. The quality of the pain is described as aching, burning and shooting. The pain is moderate. There has been no fever. She is Sexually active. There is No history of pyelonephritis. Associated symptoms include frequency and urgency. Pertinent negatives include no chills, discharge, flank pain, hematuria, hesitancy, nausea, possible pregnancy, sweats or vomiting. She has tried increased fluids for the symptoms. The treatment provided no relief.     Relevant past medical, surgical, family, and social history reviewed and updated as indicated.  Allergies and medications reviewed and updated. Data reviewed: Chart in Epic.   Past Medical History:  Diagnosis Date   Ankle fracture, left 2000   Anxiety    Arthritis    left wrist and left ankle   Asthma    Back pain    Back pain    Bronchitis    Depression    Diabetes ([place]) 01/11/2016   type II  history of   Family history of adverse reaction to anesthesia    gmother had problems with N/V    GERD (gastroesophageal reflux disease)    Headache    High cholesterol    History of kidney stones    HLD (hyperlipidemia)    HSV-2 infection    Hypertension 2013   Intracranial hypertension    psuedotumor cebrum   Joint pain    Joint pain    Kienbock's disease    Leg edema    OSA (obstructive sleep apnea)    cpap - does not know settings    Papilledema     Prediabetes    Pregnancy induced hypertension    Pseudotumor cerebri syndrome    RLS (restless legs syndrome)    Sinus tachycardia    SOB (shortness of breath)     Past Surgical History:  Procedure Laterality Date   CESAREAN SECTION N/A 07/17/2019   Procedure: CESAREAN SECTION;  Surgeon: Everett Graff, MD;  Location: [facility];  Service: Obstetrics;  Laterality: N/A;   FRACTURE SURGERY  2005   ankle   FRACTURE SURGERY  2013   wrist (deteriorating bone)   LAPAROSCOPIC GASTRIC SLEEVE RESECTION N/A 05/08/2017   Procedure: LAPAROSCOPIC GASTRIC SLEEVE RESECTION;  Surgeon: Clovis Riley, MD;  Location: [facility];  Service: General;  Laterality: N/A;   LAPAROSCOPIC GASTRIC SLEEVE RESECTION  05/08/2017   UPPER GI ENDOSCOPY  05/08/2017   Procedure: UPPER GI ENDOSCOPY;  Surgeon: Clovis Riley, MD;  Location: [facility];  Service: General;;   urethra stretch     WRIST SURGERY  2015    Social History   Socioeconomic History   Marital status: Married    Spouse name: Cecilie Kicks   Number of children: 0   Years of education: 12+   Highest education level: Not on file  Occupational History   Occupation: Solicitor: [employer]  Tobacco Use  Smoking status: Every Day    Packs/day: 1.00    Types: Cigarettes    Last attempt to quit: 10/05/2018    Years since quitting: 3.7   Smokeless tobacco: Never  Vaping Use   Vaping Use: Never used  Substance and Sexual Activity   Alcohol use: Not Currently   Drug use: Not Currently    Types: Marijuana   Sexual activity: Yes    Birth control/protection: I.U.D.    Comment: condoms previously  Other Topics Concern   Not on file  Social History Narrative   Lives with grandparents   Caffeine use: Drinks coffee/tea/soda- 20oz per day (none after starting diamox)   Social Determinants of Health   Financial Resource Strain: Not on file  Food Insecurity: Not on file  Transportation Needs: Not on file  Physical Activity: Not on file  Stress: Not  on file  Social Connections: Not on file  Intimate Partner Violence: Not on file    Outpatient Encounter Medications as of 07/11/2022  Medication Sig   acetaZOLAMIDE (DIAMOX) 250 MG tablet Take 500 mg by mouth 2 (two) times daily.   albuterol (PROVENTIL) (2.5 MG/3ML) 0.083% nebulizer solution Take 3 mLs (2.5 mg total) by nebulization every 4 (four) hours as needed for wheezing or shortness of breath.   albuterol (VENTOLIN HFA) 108 (90 Base) MCG/ACT inhaler Inhale 2 puffs into the lungs every 4 (four) hours as needed for wheezing or shortness of breath (coughing fits).   Benralizumab (FASENRA PEN) 30 MG/ML SOAJ INJECT 1 PEN UNDER THE SKIN EVERY 8 WEEKS.   Dulaglutide (TRULICITY) 3 MG/0.5ML SOPN Inject 3 mg as directed once a week.   famotidine (PEPCID) 20 MG tablet Take by mouth.   FASENRA PEN 30 MG/ML SOAJ INJECT 1 PEN UNDER THE SKIN EVERY 8 WEEKS.   FLUoxetine (PROZAC) 40 MG capsule TAKE 1 CAPSULE (40 MG TOTAL) BY MOUTH DAILY. TAKE WITH 20 MG (TOTAL OF 60 MG DAILY)   fluticasone (FLONASE) 50 MCG/ACT nasal spray Place 1 spray into both nostrils 2 (two) times daily as needed (nasal congestion).   gabapentin (NEURONTIN) 100 MG capsule Take 100 mg by mouth 3 (three) times daily.   levocetirizine (XYZAL) 5 MG tablet Take 1 tablet (5 mg total) by mouth every evening.   levonorgestrel (MIRENA, 52 MG,) 20 MCG/24HR IUD Mirena 20 mcg/24 hours (6 yrs) 52 mg intrauterine device  Take 1 device by intrauterine route.   lisinopril-hydrochlorothiazide (ZESTORETIC) 20-25 MG tablet Take 1 tablet by mouth daily.   montelukast (SINGULAIR) 10 MG tablet Take 1 tablet (10 mg total) by mouth at bedtime.   NYSTATIN powder Apply topically 2 (two) times daily.   pravastatin (PRAVACHOL) 40 MG tablet Take 1 tablet (40 mg total) by mouth daily.   sulfamethoxazole-trimethoprim (BACTRIM DS) 800-160 MG tablet Take 1 tablet by mouth 2 (two) times daily for 7 days.   VRAYLAR 4.5 MG CAPS Take 1 capsule by mouth daily.    NIFEdipine (ADALAT CC) 60 MG 24 hr tablet Take 60 mg by mouth every morning.   [DISCONTINUED] cetirizine (ZYRTEC) 10 MG tablet Take 10 mg by mouth daily.   No facility-administered encounter medications on file as of 07/11/2022.    Allergies  Allergen Reactions   Cats Claw (Uncaria Tomentosa) Itching, Shortness Of Breath and Swelling   Dust Mite Extract Itching and Shortness Of Breath   Grass Pollen(K-O-R-T-Swt Vern) Itching and Shortness Of Breath   Mixed Feathers Itching, Shortness Of Breath and Swelling   Uncaria Tomentosa (Cats  Claw) Itching and Swelling   Lactose Other (See Comments)   Pollen Extract Other (See Comments)    Review of Systems  Constitutional:  Negative for activity change, appetite change, chills, diaphoresis, fatigue, fever and unexpected weight change.  Eyes:  Negative for photophobia and visual disturbance.  Gastrointestinal:  Negative for abdominal pain, nausea and vomiting.  Genitourinary:  Positive for dysuria, frequency and urgency. Negative for decreased urine volume, difficulty urinating, dyspareunia, enuresis, flank pain, genital sores, hematuria, hesitancy, menstrual problem, pelvic pain, vaginal bleeding, vaginal discharge and vaginal pain.  Musculoskeletal:  Negative for back pain.  Neurological:  Negative for dizziness, weakness, light-headedness and headaches.  Psychiatric/Behavioral:  Negative for confusion.   All other systems reviewed and are negative.       Objective:  BP 126/77   Pulse 100   Temp 98.1 F (36.7 C) (Temporal)   Ht 5\' 7"  (1.702 m)   Wt 262 lb 9.6 oz (119.1 kg)   SpO2 99%   BMI 41.13 kg/m    Wt Readings from Last 3 Encounters:  07/11/22 262 lb 9.6 oz (119.1 kg)  06/16/22 251 lb 8 oz (114.1 kg)  05/18/22 267 lb (121.1 kg)    Physical Exam Vitals and nursing note reviewed.  Constitutional:      General: She is not in acute distress.    Appearance: Normal appearance. She is well-developed and well-groomed. She is  obese. She is not ill-appearing, toxic-appearing or diaphoretic.  HENT:     Head: Normocephalic and atraumatic.     Jaw: There is normal jaw occlusion.     Right Ear: Hearing normal.     Left Ear: Hearing normal.     Nose: Nose normal.     Mouth/Throat:     Lips: Pink.     Mouth: Mucous membranes are moist.     Pharynx: Oropharynx is clear. Uvula midline.  Eyes:     General: Lids are normal.     Extraocular Movements: Extraocular movements intact.     Conjunctiva/sclera: Conjunctivae normal.     Pupils: Pupils are equal, round, and reactive to light.  Neck:     Thyroid: No thyroid mass, thyromegaly or thyroid tenderness.     Vascular: No carotid bruit or JVD.     Trachea: Trachea and phonation normal.  Cardiovascular:     Rate and Rhythm: Normal rate and regular rhythm.     Chest Wall: PMI is not displaced.     Pulses: Normal pulses.     Heart sounds: Normal heart sounds. No murmur heard.    No friction rub. No gallop.  Pulmonary:     Effort: Pulmonary effort is normal. No respiratory distress.     Breath sounds: Normal breath sounds. No wheezing.  Abdominal:     General: Bowel sounds are normal. There is no distension or abdominal bruit.     Palpations: Abdomen is soft. There is no hepatomegaly or splenomegaly.     Tenderness: There is no abdominal tenderness. There is no right CVA tenderness or left CVA tenderness.     Hernia: No hernia is present.  Musculoskeletal:        General: Normal range of motion.     Cervical back: Normal range of motion and neck supple.     Right lower leg: No edema.     Left lower leg: No edema.  Lymphadenopathy:     Cervical: No cervical adenopathy.  Skin:    General: Skin is warm and dry.  Capillary Refill: Capillary refill takes less than 2 seconds.     Coloration: Skin is not cyanotic, jaundiced or pale.     Findings: No rash.  Neurological:     General: No focal deficit present.     Mental Status: She is alert and oriented to  person, place, and time.     Sensory: Sensation is intact.     Motor: Motor function is intact.     Coordination: Coordination is intact.     Gait: Gait is intact.     Deep Tendon Reflexes: Reflexes are normal and symmetric.  Psychiatric:        Attention and Perception: Attention and perception normal.        Mood and Affect: Mood and affect normal.        Speech: Speech normal.        Behavior: Behavior normal. Behavior is cooperative.        Thought Content: Thought content normal.        Cognition and Memory: Cognition and memory normal.        Judgment: Judgment normal.     Results for orders placed or performed in visit on 01/13/22  Bayer DCA Hb A1c Waived  Result Value Ref Range   HB A1C (BAYER DCA - WAIVED) 5.3 4.8 - 5.6 %       Pertinent labs & imaging results that were available during my care of the patient were reviewed by me and considered in my medical decision making.  Assessment & Plan:  Sherea was seen today for urinary frequency.  Diagnoses and all orders for this visit:  Frequent urination Acute cystitis without hematuria Urinalysis positive for nitrites and many bacteria. Will treat with below. Cultures pending. Further treatment pending results. Report new, worsening, or persistent symptoms.  -     Urinalysis, Routine w reflex microscopic -     sulfamethoxazole-trimethoprim (BACTRIM DS) 800-160 MG tablet; Take 1 tablet by mouth 2 (two) times daily for 7 days. -     Ct Ng M genitalium NAA, Urine -     Urine Culture     Continue all other maintenance medications.  Follow up plan: Return if symptoms worsen or fail to improve.   Continue healthy lifestyle choices, including diet (rich in fruits, vegetables, and lean proteins, and low in salt and simple carbohydrates) and exercise (at least 30 minutes of moderate physical activity daily).   The above assessment and management plan was discussed with the patient. The patient verbalized understanding  of and has agreed to the management plan. Patient is aware to call the clinic if they develop any new symptoms or if symptoms persist or worsen. Patient is aware when to return to the clinic for a follow-up visit. Patient educated on when it is appropriate to go to the emergency department.   Monia Pouch, FNP-C [facility] [phone]

## 2022-07-13 LAB — CT NG M GENITALIUM NAA, URINE
Chlamydia trachomatis, NAA: NEGATIVE
Mycoplasma genitalium NAA: NEGATIVE
Neisseria gonorrhoeae, NAA: NEGATIVE

## 2022-07-14 ENCOUNTER — Other Ambulatory Visit: Payer: Self-pay

## 2022-07-14 DIAGNOSIS — Z87440 Personal history of urinary (tract) infections: Secondary | ICD-10-CM

## 2022-07-14 LAB — URINE CULTURE

## 2022-07-18 ENCOUNTER — Encounter: Payer: Self-pay | Admitting: Plastic Surgery

## 2022-07-20 ENCOUNTER — Encounter: Payer: Self-pay | Admitting: Family Medicine

## 2022-07-20 ENCOUNTER — Other Ambulatory Visit: Payer: Self-pay | Admitting: Family Medicine

## 2022-07-20 DIAGNOSIS — E1159 Type 2 diabetes mellitus with other circulatory complications: Secondary | ICD-10-CM

## 2022-07-20 DIAGNOSIS — E119 Type 2 diabetes mellitus without complications: Secondary | ICD-10-CM

## 2022-07-21 ENCOUNTER — Other Ambulatory Visit: Payer: Self-pay | Admitting: Family Medicine

## 2022-07-21 DIAGNOSIS — E119 Type 2 diabetes mellitus without complications: Secondary | ICD-10-CM

## 2022-07-21 MED ORDER — LISINOPRIL-HYDROCHLOROTHIAZIDE 20-25 MG PO TABS: 1.0000 | ORAL_TABLET | Freq: Every day | ORAL | 0 refills | Status: DC

## 2022-07-21 NOTE — Telephone Encounter (Signed)
RE: Cassandra Tucker 10 mg/0.5 ml RF, I denied yesterday before I saw the MyChart message It is not on current med list, was [facility] 03/16/22, see TC on that date Please advise.

## 2022-07-21 NOTE — Addendum Note (Signed)
Addended by: Antonietta Barcelona D on: 07/21/2022 12:13 PM   Modules accepted: Orders

## 2022-07-24 ENCOUNTER — Telehealth: Payer: Self-pay | Admitting: Plastic Surgery

## 2022-07-24 NOTE — Telephone Encounter (Signed)
Auth started and clinicals uploaded into [employer] portal. Per Raquel James with [employer], pending and in review.

## 2022-07-31 ENCOUNTER — Encounter: Payer: Self-pay | Admitting: Plastic Surgery

## 2022-07-31 ENCOUNTER — Encounter: Payer: Self-pay | Admitting: Family Medicine

## 2022-08-08 ENCOUNTER — Encounter: Payer: Self-pay | Admitting: Family Medicine

## 2022-08-08 ENCOUNTER — Ambulatory Visit: Payer: Medicaid Other | Admitting: Family Medicine

## 2022-08-08 VITALS — BP 139/89 | HR 82 | Temp 96.4°F | Ht 67.0 in | Wt 262.6 lb

## 2022-08-08 DIAGNOSIS — E1169 Type 2 diabetes mellitus with other specified complication: Secondary | ICD-10-CM

## 2022-08-08 DIAGNOSIS — E1159 Type 2 diabetes mellitus with other circulatory complications: Secondary | ICD-10-CM | POA: Diagnosis not present

## 2022-08-08 DIAGNOSIS — E785 Hyperlipidemia, unspecified: Secondary | ICD-10-CM | POA: Diagnosis not present

## 2022-08-08 DIAGNOSIS — E119 Type 2 diabetes mellitus without complications: Secondary | ICD-10-CM | POA: Diagnosis not present

## 2022-08-08 DIAGNOSIS — F1210 Cannabis abuse, uncomplicated: Secondary | ICD-10-CM | POA: Insufficient documentation

## 2022-08-08 DIAGNOSIS — I152 Hypertension secondary to endocrine disorders: Secondary | ICD-10-CM

## 2022-08-08 DIAGNOSIS — Z01818 Encounter for other preprocedural examination: Secondary | ICD-10-CM

## 2022-08-08 DIAGNOSIS — E559 Vitamin D deficiency, unspecified: Secondary | ICD-10-CM

## 2022-08-08 DIAGNOSIS — Z903 Acquired absence of stomach [part of]: Secondary | ICD-10-CM | POA: Insufficient documentation

## 2022-08-08 LAB — URINALYSIS, ROUTINE W REFLEX MICROSCOPIC
Bilirubin, UA: NEGATIVE
Glucose, UA: NEGATIVE
Ketones, UA: NEGATIVE
Nitrite, UA: NEGATIVE
Protein,UA: NEGATIVE
Specific Gravity, UA: >1.03 — ABNORMAL HIGH (ref 1.005–1.030)
Urobilinogen, Ur: 0.2 mg/dL (ref 0.2–1.0)
pH, UA: 7 (ref 5.0–7.5)

## 2022-08-08 LAB — MICROSCOPIC EXAMINATION
RBC, Urine: >30 /hpf — AB (ref 0–2)
Renal Epithel, UA: NONE SEEN /hpf

## 2022-08-08 LAB — BAYER DCA HB A1C WAIVED: HB A1C (BAYER DCA - WAIVED): 5.2 % (ref 4.8–5.6)

## 2022-08-08 MED ORDER — TIRZEPATIDE 10 MG/0.5ML ~~LOC~~ SOAJ: 10.0000 mg | SUBCUTANEOUS | 3 refills | Status: DC

## 2022-08-08 NOTE — Progress Notes (Signed)
Subjective:  Patient ID: Cassandra Tucker, female    DOB: 1990/07/09, 32 y.o.   MRN: [id number]  Patient Care Team: Baruch Gouty, FNP as PCP - General (Family Medicine)   Chief Complaint:  Medical Clearance and Medical Management of Chronic Issues   HPI: Cassandra Tucker is a 32 y.o. female presenting on 08/08/2022 for Medical Clearance and Medical Management of Chronic Issues   1. Preoperative examination Pt is scheduled to have a panniculectomy with Dr. Marla Roe. She is also going to undergo a cosmetic upper abdomen portion at the same time. She is here today for preoperative clearance.   2. Controlled type 2 diabetes mellitus without complication, without long-term current use of insulin ([place]) Has been taking Mounjaro 10 mg weekly and doing well. A1C has been greatly controlled since 2018. She has been working on her diet and exercise. No polyuria, polyphagia, or polydipsia.   3. Hyperlipidemia associated with type 2 diabetes mellitus ([place]) On statin therapy and tolerating well. Denies myalgias from the medications. Has been doing well with diet and exercise.   4. Hypertension associated with type 2 diabetes mellitus ([place]) Compliant with medications and denies associated side effects. No headaches, chest pain, leg swelling, weakness, confusion, visual changes, or syncope. Does try to do well with diet and exercise.   5. Vitamin D deficiency Currently not on repletion therapy. Denies arthralgias trouble walking, or recent fractures.      Relevant past medical, surgical, family, and social history reviewed and updated as indicated.  Allergies and medications reviewed and updated. Data reviewed: Chart in Epic.   Past Medical History:  Diagnosis Date   Ankle fracture, left 2000   Anxiety    Arthritis    left wrist and left ankle   Asthma    Back pain    Back pain    Bronchitis    Depression    Diabetes ([place]) 01/11/2016   type II  history of   Family history of  adverse reaction to anesthesia    gmother had problems with N/V    GERD (gastroesophageal reflux disease)    Headache    High cholesterol    History of kidney stones    HLD (hyperlipidemia)    HSV-2 infection    Hypertension 2013   Intracranial hypertension    psuedotumor cebrum   Joint pain    Joint pain    Kienbock's disease    Leg edema    OSA (obstructive sleep apnea)    cpap - does not know settings    Papilledema    Prediabetes    Pregnancy induced hypertension    Pseudotumor cerebri syndrome    RLS (restless legs syndrome)    Sinus tachycardia    SOB (shortness of breath)     Past Surgical History:  Procedure Laterality Date   CESAREAN SECTION N/A 07/17/2019   Procedure: CESAREAN SECTION;  Surgeon: Everett Graff, MD;  Location: [facility];  Service: Obstetrics;  Laterality: N/A;   FRACTURE SURGERY  2005   ankle   FRACTURE SURGERY  2013   wrist (deteriorating bone)   LAPAROSCOPIC GASTRIC SLEEVE RESECTION N/A 05/08/2017   Procedure: LAPAROSCOPIC GASTRIC SLEEVE RESECTION;  Surgeon: Clovis Riley, MD;  Location: [facility];  Service: General;  Laterality: N/A;   LAPAROSCOPIC GASTRIC SLEEVE RESECTION  05/08/2017   UPPER GI ENDOSCOPY  05/08/2017   Procedure: UPPER GI ENDOSCOPY;  Surgeon: Clovis Riley, MD;  Location: [facility];  Service: General;;  urethra stretch     WRIST SURGERY  2015    Social History   Socioeconomic History   Marital status: Married    Spouse name: Cecilie Kicks   Number of children: 0   Years of education: 12+   Highest education level: Not on file  Occupational History   Occupation: Solicitor: [employer]  Tobacco Use   Smoking status: Every Day    Packs/day: 1.00    Types: Cigarettes    Last attempt to quit: 10/05/2018    Years since quitting: 3.8   Smokeless tobacco: Never  Vaping Use   Vaping Use: Never used  Substance and Sexual Activity   Alcohol use: Not Currently   Drug use: Not Currently    Types: Marijuana   Sexual  activity: Yes    Birth control/protection: I.U.D.    Comment: condoms previously  Other Topics Concern   Not on file  Social History Narrative   Lives with grandparents   Caffeine use: Drinks coffee/tea/soda- 20oz per day (none after starting diamox)   Social Determinants of Health   Financial Resource Strain: Not on file  Food Insecurity: Not on file  Transportation Needs: Not on file  Physical Activity: Not on file  Stress: Not on file  Social Connections: Not on file  Intimate Partner Violence: Not on file    Outpatient Encounter Medications as of 08/08/2022  Medication Sig   acetaZOLAMIDE (DIAMOX) 250 MG tablet Take 500 mg by mouth 2 (two) times daily.   albuterol (PROVENTIL) (2.5 MG/3ML) 0.083% nebulizer solution Take 3 mLs (2.5 mg total) by nebulization every 4 (four) hours as needed for wheezing or shortness of breath.   albuterol (VENTOLIN HFA) 108 (90 Base) MCG/ACT inhaler Inhale 2 puffs into the lungs every 4 (four) hours as needed for wheezing or shortness of breath (coughing fits).   azelastine (ASTELIN) 0.1 % nasal spray Place 1 spray into both nostrils daily as needed for rhinitis. Use in each nostril as directed   Benralizumab (FASENRA PEN) 30 MG/ML SOAJ INJECT 1 PEN UNDER THE SKIN EVERY 8 WEEKS.   famotidine (PEPCID) 20 MG tablet Take by mouth.   FASENRA PEN 30 MG/ML SOAJ INJECT 1 PEN UNDER THE SKIN EVERY 8 WEEKS.   FLUoxetine (PROZAC) 40 MG capsule TAKE 1 CAPSULE (40 MG TOTAL) BY MOUTH DAILY. TAKE WITH 20 MG (TOTAL OF 60 MG DAILY)   fluticasone (FLONASE) 50 MCG/ACT nasal spray Place 1 spray into both nostrils 2 (two) times daily as needed (nasal congestion).   levocetirizine (XYZAL) 5 MG tablet Take 1 tablet (5 mg total) by mouth every evening.   levonorgestrel (MIRENA, 52 MG,) 20 MCG/24HR IUD Mirena 20 mcg/24 hours (6 yrs) 52 mg intrauterine device  Take 1 device by intrauterine route.   lisinopril-hydrochlorothiazide (ZESTORETIC) 20-25 MG tablet Take 1 tablet by  mouth daily.   NYSTATIN powder Apply topically 2 (two) times daily.   pravastatin (PRAVACHOL) 40 MG tablet Take 1 tablet (40 mg total) by mouth daily.   tirzepatide (MOUNJARO) 10 MG/0.5ML Pen Inject 10 mg into the skin once a week.   VRAYLAR 4.5 MG CAPS Take 1 capsule by mouth daily.   [DISCONTINUED] gabapentin (NEURONTIN) 100 MG capsule Take 100 mg by mouth 3 (three) times daily.   [DISCONTINUED] montelukast (SINGULAIR) 10 MG tablet Take 1 tablet (10 mg total) by mouth at bedtime.   NIFEdipine (ADALAT CC) 60 MG 24 hr tablet Take 60 mg by mouth every morning.   [DISCONTINUED] cetirizine (  ZYRTEC) 10 MG tablet Take 10 mg by mouth daily.   [DISCONTINUED] Dulaglutide (TRULICITY) 3 0000000 SOPN Inject 3 mg as directed once a week. (Patient not taking: Reported on 08/08/2022)   No facility-administered encounter medications on file as of 08/08/2022.    Allergies  Allergen Reactions   Cats Claw (Uncaria Tomentosa) Itching, Shortness Of Breath and Swelling   Dust Mite Extract Itching and Shortness Of Breath   Grass Pollen(K-O-R-T-Swt Vern) Itching and Shortness Of Breath   Mixed Feathers Itching, Shortness Of Breath and Swelling   Uncaria Tomentosa (Cats Claw) Itching and Swelling   Gramineae Pollens Other (See Comments)   Lactose Other (See Comments)   Pollen Extract Other (See Comments)    Review of Systems  Constitutional:  Negative for activity change, appetite change, chills, diaphoresis, fatigue, fever and unexpected weight change.  HENT: Negative.    Eyes: Negative.  Negative for photophobia and visual disturbance.  Respiratory:  Negative for cough, chest tightness and shortness of breath.   Cardiovascular:  Negative for chest pain, palpitations and leg swelling.  Gastrointestinal:  Negative for abdominal pain, blood in stool, constipation, diarrhea, nausea and vomiting.  Endocrine: Negative.  Negative for polydipsia, polyphagia and polyuria.  Genitourinary:  Negative for decreased  urine volume, difficulty urinating, dysuria, frequency and urgency.  Musculoskeletal:  Negative for arthralgias and myalgias.  Skin: Negative.   Allergic/Immunologic: Negative.   Neurological:  Negative for dizziness, tremors, seizures, syncope, facial asymmetry, speech difficulty, weakness, light-headedness, numbness and headaches.  Hematological: Negative.   Psychiatric/Behavioral:  Negative for confusion, hallucinations, sleep disturbance and suicidal ideas.   All other systems reviewed and are negative.       Objective:  BP 139/89   Pulse 82   Temp (!) 96.4 F (35.8 C) (Temporal)   Ht '5\' 7"'$  (1.702 m)   Wt 262 lb 9.6 oz (119.1 kg)   SpO2 99%   BMI 41.13 kg/m    Wt Readings from Last 3 Encounters:  08/08/22 262 lb 9.6 oz (119.1 kg)  07/11/22 262 lb 9.6 oz (119.1 kg)  06/16/22 251 lb 8 oz (114.1 kg)    Physical Exam Vitals and nursing note reviewed.  Constitutional:      General: She is not in acute distress.    Appearance: Normal appearance. She is well-developed and well-groomed. She is morbidly obese. She is not ill-appearing, toxic-appearing or diaphoretic.  HENT:     Head: Normocephalic and atraumatic.     Jaw: There is normal jaw occlusion.     Right Ear: Hearing normal.     Left Ear: Hearing normal.     Nose: Nose normal.     Mouth/Throat:     Lips: Pink.     Mouth: Mucous membranes are moist.     Pharynx: Oropharynx is clear. Uvula midline.  Eyes:     General: Lids are normal.     Extraocular Movements: Extraocular movements intact.     Conjunctiva/sclera: Conjunctivae normal.     Pupils: Pupils are equal, round, and reactive to light.  Neck:     Thyroid: No thyroid mass, thyromegaly or thyroid tenderness.     Vascular: No carotid bruit or JVD.     Trachea: Trachea and phonation normal.  Cardiovascular:     Rate and Rhythm: Normal rate and regular rhythm.     Chest Wall: PMI is not displaced.     Pulses: Normal pulses.          Dorsalis pedis pulses  are 2+  on the right side and 2+ on the left side.       Posterior tibial pulses are 2+ on the right side and 2+ on the left side.     Heart sounds: Normal heart sounds. No murmur heard.    No friction rub. No gallop.  Pulmonary:     Effort: Pulmonary effort is normal. No respiratory distress.     Breath sounds: Normal breath sounds. No wheezing.  Abdominal:     General: Abdomen is protuberant. Bowel sounds are normal. There is no distension or abdominal bruit.     Palpations: Abdomen is soft. There is no hepatomegaly or splenomegaly.     Tenderness: There is no abdominal tenderness. There is no right CVA tenderness or left CVA tenderness.     Hernia: No hernia is present.     Comments: Large panniculus  Musculoskeletal:        General: Normal range of motion.     Cervical back: Normal range of motion and neck supple.     Right lower leg: No edema.     Left lower leg: No edema.  Feet:     Right foot:     Protective Sensation: 10 sites tested.  10 sites sensed.     Skin integrity: Skin integrity normal.     Left foot:     Protective Sensation: 10 sites tested.  10 sites sensed.     Skin integrity: Skin integrity normal.  Lymphadenopathy:     Cervical: No cervical adenopathy.  Skin:    General: Skin is warm and dry.     Capillary Refill: Capillary refill takes less than 2 seconds.     Coloration: Skin is not cyanotic, jaundiced or pale.     Findings: No rash.  Neurological:     General: No focal deficit present.     Mental Status: She is alert and oriented to person, place, and time.     Sensory: Sensation is intact.     Motor: Motor function is intact.     Coordination: Coordination is intact.     Gait: Gait is intact.     Deep Tendon Reflexes: Reflexes are normal and symmetric.  Psychiatric:        Attention and Perception: Attention and perception normal.        Mood and Affect: Mood and affect normal.        Speech: Speech normal.        Behavior: Behavior normal.  Behavior is cooperative.        Thought Content: Thought content normal.        Cognition and Memory: Cognition and memory normal.        Judgment: Judgment normal.     Results for orders placed or performed in visit on 07/11/22  Urine Culture   Specimen: Urine   UR  Result Value Ref Range   Urine Culture, Routine Final report (A)    Organism ID, Bacteria Escherichia coli (A)    Antimicrobial Susceptibility Comment   Microscopic Examination   Urine  Result Value Ref Range   WBC, UA None seen 0 - 5 /hpf   RBC, Urine None seen 0 - 2 /hpf   Epithelial Cells (non renal) 0-10 0 - 10 /hpf   Renal Epithel, UA None seen None seen /hpf   Bacteria, UA Many (A) None seen/Few  Urinalysis, Routine w reflex microscopic  Result Value Ref Range   Specific Gravity, UA 1.025 1.005 - 1.030  pH, UA 6.0 5.0 - 7.5   Color, UA Yellow Yellow   Appearance Ur Clear Clear   Leukocytes,UA Negative Negative   Protein,UA Negative Negative/Trace   Glucose, UA Negative Negative   Ketones, UA Negative Negative   RBC, UA Negative Negative   Bilirubin, UA Negative Negative   Urobilinogen, Ur 0.2 0.2 - 1.0 mg/dL   Nitrite, UA Positive (A) Negative   Microscopic Examination See below:   Ct Ng M genitalium NAA, Urine  Result Value Ref Range   Mycoplasma genitalium NAA Negative Negative   Chlamydia trachomatis, NAA Negative Negative   Neisseria gonorrhoeae, NAA Negative Negative       Pertinent labs & imaging results that were available during my care of the patient were reviewed by me and considered in my medical decision making.  Assessment & Plan:  Jaslyne was seen today for medical clearance.  Diagnoses and all orders for this visit:  Preoperative examination Labs completed today, further risk stratification once labs result.  -     CBC with Differential/Platelet -     CMP14+EGFR -     Thyroid Panel With TSH -     Bayer DCA Hb A1c Waived -     Urinalysis, Routine w reflex  microscopic  Controlled type 2 diabetes mellitus without complication, without long-term current use of insulin ([place]) Has been very well controlled with current regimen. Will continue. Labs pending. Diet and exercise encouraged.  -     CBC with Differential/Platelet -     CMP14+EGFR -     Lipid panel -     Thyroid Panel With TSH -     Bayer DCA Hb A1c Waived -     Microalbumin / creatinine urine ratio -     tirzepatide (MOUNJARO) 10 MG/0.5ML Pen; Inject 10 mg into the skin once a week.  Hyperlipidemia associated with type 2 diabetes mellitus ([place]) Diet encouraged - increase intake of fresh fruits and vegetables, increase intake of lean proteins. Bake, broil, or grill foods. Avoid fried, greasy, and fatty foods. Avoid fast foods. Increase intake of fiber-rich whole grains. Exercise encouraged - at least 150 minutes per week and advance as tolerated.  Goal BMI < 25. Continue medications as prescribed. Follow up in 3-6 months as discussed.  -     CMP14+EGFR -     Lipid panel  Hypertension associated with type 2 diabetes mellitus ([facility]) BP well controlled. Changes were not made in regimen today. Goal BP is 130/80. Pt aware to report any persistent high or low readings. DASH diet and exercise encouraged. Exercise at least 150 minutes per week and increase as tolerated. Goal BMI > 25. Stress management encouraged. Avoid nicotine and tobacco product use. Avoid excessive alcohol and NSAID's. Avoid more than 2000 mg of sodium daily. Medications as prescribed. Follow up as scheduled.  -     CBC with Differential/Platelet -     CMP14+EGFR -     Lipid panel -     Thyroid Panel With TSH -     Microalbumin / creatinine urine ratio  Vitamin D deficiency Labs pending. Continue repletion therapy. If indicated, will change repletion dosage. Eat foods rich in Vit D including milk, orange juice, yogurt with vitamin D added, salmon or mackerel, canned tuna fish, cereals with vitamin D added, and cod liver oil.  Get out in the sun but make sure to wear at least SPF 30 sunscreen.  -     VITAMIN D 25 Hydroxy (Vit-D Deficiency,  Fractures)     Continue all other maintenance medications.  Follow up plan: Return if symptoms worsen or fail to improve.   Continue healthy lifestyle choices, including diet (rich in fruits, vegetables, and lean proteins, and low in salt and simple carbohydrates) and exercise (at least 30 minutes of moderate physical activity daily).  Educational handout given for DM  The above assessment and management plan was discussed with the patient. The patient verbalized understanding of and has agreed to the management plan. Patient is aware to call the clinic if they develop any new symptoms or if symptoms persist or worsen. Patient is aware when to return to the clinic for a follow-up visit. Patient educated on when it is appropriate to go to the emergency department.   Monia Pouch, FNP-C [facility] [phone]

## 2022-08-08 NOTE — Patient Instructions (Signed)

## 2022-08-08 NOTE — Addendum Note (Signed)
Addended by: Baruch Gouty on: 08/08/2022 04:37 PM   Modules accepted: Orders

## 2022-08-09 LAB — CBC WITH DIFFERENTIAL/PLATELET
Basophils Absolute: 0 10*3/uL (ref 0.0–0.2)
Basos: 0 %
EOS (ABSOLUTE): 0 10*3/uL (ref 0.0–0.4)
Eos: 0 %
Hematocrit: 42 % (ref 34.0–46.6)
Hemoglobin: 13.8 g/dL (ref 11.1–15.9)
Immature Grans (Abs): 0 10*3/uL (ref 0.0–0.1)
Immature Granulocytes: 0 %
Lymphocytes Absolute: 2.9 10*3/uL (ref 0.7–3.1)
Lymphs: 32 %
MCH: 27.8 pg (ref 26.6–33.0)
MCHC: 32.9 g/dL (ref 31.5–35.7)
MCV: 85 fL (ref 79–97)
Monocytes Absolute: 0.5 10*3/uL (ref 0.1–0.9)
Monocytes: 5 %
Neutrophils Absolute: 5.6 10*3/uL (ref 1.4–7.0)
Neutrophils: 63 %
Platelets: 264 10*3/uL (ref 150–450)
RBC: 4.96 x10E6/uL (ref 3.77–5.28)
RDW: 12.7 % (ref 11.7–15.4)
WBC: 9 10*3/uL (ref 3.4–10.8)

## 2022-08-09 LAB — LIPID PANEL
Chol/HDL Ratio: 5.5 ratio — ABNORMAL HIGH (ref 0.0–4.4)
Cholesterol, Total: 205 mg/dL — ABNORMAL HIGH (ref 100–199)
HDL: 37 mg/dL — ABNORMAL LOW (ref >39)
LDL Chol Calc (NIH): 127 mg/dL — ABNORMAL HIGH (ref 0–99)
Triglycerides: 233 mg/dL — ABNORMAL HIGH (ref 0–149)
VLDL Cholesterol Cal: 41 mg/dL — ABNORMAL HIGH (ref 5–40)

## 2022-08-09 LAB — CMP14+EGFR
ALT: 8 IU/L (ref 0–32)
AST: 7 IU/L (ref 0–40)
Albumin/Globulin Ratio: 1.6 (ref 1.2–2.2)
Albumin: 4.2 g/dL (ref 4.0–5.0)
Alkaline Phosphatase: 66 IU/L (ref 44–121)
BUN/Creatinine Ratio: 21 (ref 9–23)
BUN: 13 mg/dL (ref 6–20)
Bilirubin Total: 0.5 mg/dL (ref 0.0–1.2)
CO2: 24 mmol/L (ref 20–29)
Calcium: 9.4 mg/dL (ref 8.7–10.2)
Chloride: 103 mmol/L (ref 96–106)
Creatinine, Ser: 0.63 mg/dL (ref 0.57–1.00)
Globulin, Total: 2.6 g/dL (ref 1.5–4.5)
Glucose: 79 mg/dL (ref 70–99)
Potassium: 4.6 mmol/L (ref 3.5–5.2)
Sodium: 139 mmol/L (ref 134–144)
Total Protein: 6.8 g/dL (ref 6.0–8.5)
eGFR: 122 mL/min/{1.73_m2} (ref >59)

## 2022-08-09 LAB — THYROID PANEL WITH TSH
Free Thyroxine Index: 2.4 (ref 1.2–4.9)
T3 Uptake Ratio: 28 % (ref 24–39)
T4, Total: 8.5 ug/dL (ref 4.5–12.0)
TSH: 1.48 u[IU]/mL (ref 0.450–4.500)

## 2022-08-09 LAB — VITAMIN D 25 HYDROXY (VIT D DEFICIENCY, FRACTURES): Vit D, 25-Hydroxy: 28.1 ng/mL — ABNORMAL LOW (ref 30.0–100.0)

## 2022-08-09 LAB — SPECIMEN STATUS REPORT

## 2022-08-10 LAB — MICROALBUMIN / CREATININE URINE RATIO
Creatinine, Urine: 114.7 mg/dL
Microalb/Creat Ratio: 36 mg/g creat — ABNORMAL HIGH (ref 0–29)
Microalbumin, Urine: 40.9 ug/mL

## 2022-08-11 ENCOUNTER — Ambulatory Visit (INDEPENDENT_AMBULATORY_CARE_PROVIDER_SITE_OTHER): Payer: Medicaid Other | Admitting: Surgical

## 2022-08-11 VITALS — BP 163/98 | HR 91 | Ht 67.0 in | Wt 262.0 lb

## 2022-08-11 DIAGNOSIS — M793 Panniculitis, unspecified: Secondary | ICD-10-CM

## 2022-08-11 DIAGNOSIS — E119 Type 2 diabetes mellitus without complications: Secondary | ICD-10-CM

## 2022-08-11 DIAGNOSIS — M546 Pain in thoracic spine: Secondary | ICD-10-CM

## 2022-08-11 DIAGNOSIS — G8929 Other chronic pain: Secondary | ICD-10-CM

## 2022-08-11 DIAGNOSIS — Z720 Tobacco use: Secondary | ICD-10-CM

## 2022-08-11 DIAGNOSIS — Z9884 Bariatric surgery status: Secondary | ICD-10-CM

## 2022-08-11 NOTE — Progress Notes (Unsigned)
Patient ID: Cassandra Tucker, female    DOB: 1990-10-16, 32 y.o.   MRN: [id number]  No chief complaint on file.   No diagnosis found.   History of Present Illness: Cassandra Tucker is a 32 y.o.  female  with a history of ***.  She presents for preoperative evaluation for upcoming procedure, ***, scheduled for *** with Dr. [id number].  The patient {HAS HAS [id number] had problems with anesthesia. ***  Summary of Previous Visit: ***  Job: ***  PMH Significant for: Gastric sleeve, LE swelling, HTN, HLD. Hx of asthma, well controlled at this time. Reports no recent exacerbations.  Patient is a smoker and is currently smoking about a 1/2 PPD. She reports she was planning to quit before surgery.  She does have a Mirena IUD in place.  LOVENOX? Smoking, BMI, Mirena, Swelling   Past Medical History: Allergies: Allergies  Allergen Reactions   Cats Claw (Uncaria Tomentosa) Itching, Shortness Of Breath and Swelling   Dust Mite Extract Itching and Shortness Of Breath   Grass Pollen(K-O-R-T-Swt Vern) Itching and Shortness Of Breath   Mixed Feathers Itching, Shortness Of Breath and Swelling   Uncaria Tomentosa (Cats Claw) Itching and Swelling   Gramineae Pollens Other (See Comments)   Lactose Other (See Comments)   Pollen Extract Other (See Comments)    Current Medications:  Current Outpatient Medications:    acetaZOLAMIDE (DIAMOX) 250 MG tablet, Take 500 mg by mouth 2 (two) times daily., Disp: , Rfl:    albuterol (PROVENTIL) (2.5 MG/3ML) 0.083% nebulizer solution, Take 3 mLs (2.5 mg total) by nebulization every 4 (four) hours as needed for wheezing or shortness of breath., Disp: 75 mL, Rfl: 1   albuterol (VENTOLIN HFA) 108 (90 Base) MCG/ACT inhaler, Inhale 2 puffs into the lungs every 4 (four) hours as needed for wheezing or shortness of breath (coughing fits)., Disp: 18 g, Rfl: 1   azelastine (ASTELIN) 0.1 % nasal spray, Place 1 spray into both  nostrils daily as needed for rhinitis. Use in each nostril as directed, Disp: , Rfl:    Benralizumab (FASENRA PEN) 30 MG/ML SOAJ, INJECT 1 PEN UNDER THE SKIN EVERY 8 WEEKS., Disp: 1 mL, Rfl: 6   famotidine (PEPCID) 20 MG tablet, Take by mouth., Disp: , Rfl:    FASENRA PEN 30 MG/ML SOAJ, INJECT 1 PEN UNDER THE SKIN EVERY 8 WEEKS., Disp: 1 mL, Rfl: 6   FLUoxetine (PROZAC) 40 MG capsule, TAKE 1 CAPSULE (40 MG TOTAL) BY MOUTH DAILY. TAKE WITH 20 MG (TOTAL OF 60 MG DAILY), Disp: 90 capsule, Rfl: 0   fluticasone (FLONASE) 50 MCG/ACT nasal spray, Place 1 spray into both nostrils 2 (two) times daily as needed (nasal congestion)., Disp: 16 g, Rfl: 5   levocetirizine (XYZAL) 5 MG tablet, Take 1 tablet (5 mg total) by mouth every evening., Disp: 90 tablet, Rfl: 3   levonorgestrel (MIRENA, 52 MG,) 20 MCG/24HR IUD, Mirena 20 mcg/24 hours (6 yrs) 52 mg intrauterine device  Take 1 device by intrauterine route., Disp: , Rfl:    lisinopril-hydrochlorothiazide (ZESTORETIC) 20-25 MG tablet, Take 1 tablet by mouth daily., Disp: 90 tablet, Rfl: 0   NYSTATIN powder, Apply topically 2 (two) times daily., Disp: , Rfl:    pravastatin (PRAVACHOL) 40 MG tablet, Take 1 tablet (40 mg total) by mouth daily., Disp: 90 tablet, Rfl: 3   tirzepatide (MOUNJARO) 10 MG/0.5ML Pen, Inject 10 mg into the skin once a week., Disp: 6 mL, Rfl: 3   VRAYLAR  4.5 MG CAPS, Take 1 capsule by mouth daily., Disp: , Rfl:    NIFEdipine (ADALAT CC) 60 MG 24 hr tablet, Take 60 mg by mouth every morning. (Patient not taking: Reported on 08/11/2022), Disp: , Rfl:   Past Medical Problems: Past Medical History:  Diagnosis Date   Ankle fracture, left 2000   Anxiety    Arthritis    left wrist and left ankle   Asthma    Back pain    Back pain    Bronchitis    Depression    Diabetes ([place]) 01/11/2016   type II  history of   Family history of adverse reaction to anesthesia    gmother had problems with N/V    GERD (gastroesophageal reflux disease)     Headache    High cholesterol    History of kidney stones    HLD (hyperlipidemia)    HSV-2 infection    Hypertension 2013   Intracranial hypertension    psuedotumor cebrum   Joint pain    Joint pain    Kienbock's disease    Leg edema    OSA (obstructive sleep apnea)    cpap - does not know settings    Papilledema    Prediabetes    Pregnancy induced hypertension    Pseudotumor cerebri syndrome    RLS (restless legs syndrome)    Sinus tachycardia    SOB (shortness of breath)     Past Surgical History: Past Surgical History:  Procedure Laterality Date   CESAREAN SECTION N/A 07/17/2019   Procedure: CESAREAN SECTION;  Surgeon: Everett Graff, MD;  Location: [facility];  Service: Obstetrics;  Laterality: N/A;   FRACTURE SURGERY  2005   ankle   FRACTURE SURGERY  2013   wrist (deteriorating bone)   LAPAROSCOPIC GASTRIC SLEEVE RESECTION N/A 05/08/2017   Procedure: LAPAROSCOPIC GASTRIC SLEEVE RESECTION;  Surgeon: Clovis Riley, MD;  Location: [facility];  Service: General;  Laterality: N/A;   LAPAROSCOPIC GASTRIC SLEEVE RESECTION  05/08/2017   UPPER GI ENDOSCOPY  05/08/2017   Procedure: UPPER GI ENDOSCOPY;  Surgeon: Clovis Riley, MD;  Location: [facility];  Service: General;;   urethra stretch     WRIST SURGERY  2015    Social History: Social History   Socioeconomic History   Marital status: Married    Spouse name: Cassandra Tucker   Number of children: 0   Years of education: 12+   Highest education level: Not on file  Occupational History   Occupation: Solicitor: [employer]  Tobacco Use   Smoking status: Every Day    Packs/day: 1.00    Types: Cigarettes    Last attempt to quit: 10/05/2018    Years since quitting: 3.8   Smokeless tobacco: Never  Vaping Use   Vaping Use: Never used  Substance and Sexual Activity   Alcohol use: Not Currently   Drug use: Not Currently    Types: Marijuana   Sexual activity: Yes    Birth control/protection: I.U.D.    Comment: condoms  previously  Other Topics Concern   Not on file  Social History Narrative   Lives with grandparents   Caffeine use: Drinks coffee/tea/soda- 20oz per day (none after starting diamox)   Social Determinants of Health   Financial Resource Strain: Not on file  Food Insecurity: Not on file  Transportation Needs: Not on file  Physical Activity: Not on file  Stress: Not on file  Social Connections: Not on file  Intimate  Partner Violence: Not on file    Family History: Family History  Problem Relation Age of Onset   Heart attack Mother    Hypertension Mother    Obesity Mother    Depression Mother    Anxiety disorder Mother    Bipolar disorder Mother    Allergic rhinitis Mother    Sleep apnea Mother    Cancer Father    Allergic rhinitis Father    Depression Father    Alcoholism Father    Allergic rhinitis Brother    Allergic rhinitis Maternal Grandmother    Migraines Neg Hx    Asthma Neg Hx    Eczema Neg Hx    Urticaria Neg Hx     Review of Systems: ROS  Physical Exam: Vital Signs There were no vitals taken for this visit.  Physical Exam *** Constitutional:      General: Not in acute distress.    Appearance: Normal appearance. Not ill-appearing.  HENT:     Head: Normocephalic and atraumatic.  Eyes:     Pupils: Pupils are equal, round Neck:     Musculoskeletal: Normal range of motion.  Cardiovascular:     Rate and Rhythm: Normal rate    Pulses: Normal pulses.  Pulmonary:     Effort: Pulmonary effort is normal. No respiratory distress.  Abdominal:     General: Abdomen is flat. There is no distension.  Musculoskeletal: Normal range of motion.  Skin:    General: Skin is warm and dry.     Findings: No erythema or rash.  Neurological:     General: No focal deficit present.     Mental Status: Alert and oriented to person, place, and time. Mental status is at baseline.     Motor: No weakness.  Psychiatric:        Mood and Affect: Mood normal.        Behavior:  Behavior normal.    Assessment/Plan: The patient is scheduled for *** with Dr. [id number].  Risks, benefits, and alternatives of procedure discussed, questions answered and consent obtained.    Smoking Status: ***; Counseling Given? *** Last Mammogram: ***; Results: ***  Caprini Score: ***; Risk Factors include: ***, BMI *** 25, and length of planned surgery. Recommendation for mechanical *** prophylaxis. Encourage early ambulation.   Pictures obtained: '@consult'$ ***  Post-op Rx sent to pharmacy: {Blank:[id number]::"Oxycodone, Zofran, Keflex","Oxycodone, Zofran"}  Patient was provided with the *** General Surgical Risk consent document and Pain Medication Agreement prior to their appointment.  They had adequate time to read through the risk consent documents and Pain Medication Agreement. We also discussed them in person together during this preop appointment. All of their questions were answered to their satisfaction.  Recommended calling if they have any further questions.  Risk consent form and Pain Medication Agreement to be scanned into patient's chart.  ***   Electronically signed by: Carola Rhine Rye Dorado, PA-C 08/11/2022 9:45 AM

## 2022-08-14 ENCOUNTER — Encounter: Payer: Self-pay | Admitting: Plastic Surgery

## 2022-08-18 ENCOUNTER — Encounter: Payer: Self-pay | Admitting: Family Medicine

## 2022-08-21 ENCOUNTER — Other Ambulatory Visit (HOSPITAL_COMMUNITY): Payer: Self-pay

## 2022-09-05 ENCOUNTER — Other Ambulatory Visit: Payer: Self-pay | Admitting: Allergy

## 2022-09-12 ENCOUNTER — Encounter: Payer: Medicaid Other | Admitting: Plastic Surgery

## 2022-09-26 ENCOUNTER — Encounter: Payer: Medicaid Other | Admitting: Physician Assistant

## 2022-10-10 ENCOUNTER — Encounter: Payer: Medicaid Other | Admitting: Physician Assistant

## 2022-10-18 ENCOUNTER — Encounter: Payer: Self-pay | Admitting: Family Medicine

## 2022-10-18 ENCOUNTER — Ambulatory Visit (INDEPENDENT_AMBULATORY_CARE_PROVIDER_SITE_OTHER): Payer: Medicaid Other | Admitting: Family Medicine

## 2022-10-18 VITALS — BP 136/88 | HR 84 | Temp 98.2°F | Ht 67.0 in | Wt 262.4 lb

## 2022-10-18 DIAGNOSIS — E559 Vitamin D deficiency, unspecified: Secondary | ICD-10-CM | POA: Diagnosis not present

## 2022-10-18 DIAGNOSIS — R7309 Other abnormal glucose: Secondary | ICD-10-CM | POA: Diagnosis not present

## 2022-10-18 DIAGNOSIS — I152 Hypertension secondary to endocrine disorders: Secondary | ICD-10-CM

## 2022-10-18 DIAGNOSIS — E1159 Type 2 diabetes mellitus with other circulatory complications: Secondary | ICD-10-CM

## 2022-10-18 DIAGNOSIS — Z0001 Encounter for general adult medical examination with abnormal findings: Secondary | ICD-10-CM

## 2022-10-18 DIAGNOSIS — F411 Generalized anxiety disorder: Secondary | ICD-10-CM | POA: Diagnosis not present

## 2022-10-18 DIAGNOSIS — E119 Type 2 diabetes mellitus without complications: Secondary | ICD-10-CM

## 2022-10-18 DIAGNOSIS — F339 Major depressive disorder, recurrent, unspecified: Secondary | ICD-10-CM

## 2022-10-18 DIAGNOSIS — Z7984 Long term (current) use of oral hypoglycemic drugs: Secondary | ICD-10-CM

## 2022-10-18 DIAGNOSIS — E785 Hyperlipidemia, unspecified: Secondary | ICD-10-CM

## 2022-10-18 DIAGNOSIS — E1169 Type 2 diabetes mellitus with other specified complication: Secondary | ICD-10-CM

## 2022-10-18 DIAGNOSIS — Z0000 Encounter for general adult medical examination without abnormal findings: Secondary | ICD-10-CM | POA: Diagnosis not present

## 2022-10-18 LAB — BAYER DCA HB A1C WAIVED: HB A1C (BAYER DCA - WAIVED): 5.3 % (ref 4.8–5.6)

## 2022-10-18 MED ORDER — FLUOXETINE HCL 40 MG PO CAPS: 40.0000 mg | ORAL_CAPSULE | Freq: Every day | ORAL | 1 refills | Status: DC

## 2022-10-18 MED ORDER — VRAYLAR 4.5 MG PO CAPS: 1.0000 | ORAL_CAPSULE | Freq: Every day | ORAL | 1 refills | Status: DC

## 2022-10-18 MED ORDER — LISINOPRIL-HYDROCHLOROTHIAZIDE 20-25 MG PO TABS: 1.0000 | ORAL_TABLET | Freq: Every day | ORAL | 1 refills | Status: DC

## 2022-10-18 NOTE — Progress Notes (Signed)
Complete physical exam  Patient: Cassandra Tucker   DOB: [birth year]   32 y.o. Female  MRN: [id number]  Subjective:    Chief Complaint  Patient presents with   Annual Exam    Cassandra Tucker is a 32 y.o. female who presents today for a complete physical exam. She reports consuming a general diet. The patient does not participate in regular exercise at present. She generally feels well. She reports sleeping well. She does not have additional problems to discuss today.  Annual Physical Exam Health maintenance discussed in detail and updated today.   2. Depression, recurrent (HCC) 3. GAD (generalized anxiety disorder) Feels things are worse right now but reports it is situational. Has been on Vraylar and tolerates well. Would like to continue this.     10/18/2022   10:39 AM 08/08/2022    2:48 PM 01/13/2022    2:13 PM 12/16/2021    9:19 AM 09/22/2021    9:09 AM  Depression screen PHQ 2/9  Decreased Interest 2 1 2 3 2   Down, Depressed, Hopeless 2 2 2 2 2   PHQ - 2 Score 4 3 4 5 4   Altered sleeping 2 1 3 3 2   Tired, decreased energy 3 2 3 3 2   Change in appetite 3 3 3 3 2   Feeling bad or failure about yourself  0 0 0 0 0  Trouble concentrating 3 2 3 1 2   Moving slowly or fidgety/restless 2 1 3 1 1   Suicidal thoughts 0 0 0 0 0  PHQ-9 Score 17 12 19 16 13   Difficult doing work/chores Very difficult Somewhat difficult Extremely dIfficult Extremely dIfficult Somewhat difficult      10/18/2022   10:39 AM 08/08/2022    2:48 PM 01/13/2022    2:14 PM 12/16/2021    9:19 AM  GAD 7 : Generalized Anxiety Score  Nervous, Anxious, on Edge 3 3 3 3   Control/stop worrying 3 2 2 2   Worry too much - different things 3 2 2 3   Trouble relaxing 3 2 2 3   Restless 3 2 2 3   Easily annoyed or irritable 3 3 3 3   Afraid - awful might happen 0 0 0 0  Total GAD 7 Score 18 14 14 17   Anxiety Difficulty Very difficult  Extremely difficult Extremely difficult   4. Hypertension associated with type 2 diabetes  mellitus (HCC) Complaint with meds - Yes Current Medications - Diamox, Zestoretic Checking BP at home - no Exercising Regularly - No Watching Salt intake - Yes Pertinent ROS:  Headache - No Fatigue - Yes Visual Disturbances - No Chest pain - No Dyspnea - No Palpitations - No LE edema - No They report good compliance with medications and can restate their regimen by memory. No medication side effects.  BP Readings from Last 3 Encounters:  10/18/22 136/88  08/11/22 (!) 163/98  08/08/22 139/89     5. Vitamin D deficiency Pt is taking oral repletion therapy. Denies bone pain and tenderness, muscle weakness, fracture, and difficulty walking. Lab Results  Component Value Date   VD25OH 28.1 (L) 08/08/2022   VD25OH 25.4 (L) 04/21/2021   VD25OH 27.6 (L) 02/18/2020   Lab Results  Component Value Date   CALCIUM 9.4 08/08/2022      6. Controlled type 2 diabetes mellitus without complication, without long-term current use of insulin (HCC) Compliant with medications without associated side effects. No polyuria, polyphagia, or polydipsia.    Most recent fall risk assessment:  10/18/2022   10:39 AM  Fall Risk   Falls in the past year? 0     Most recent depression screenings:    10/18/2022   10:39 AM 08/08/2022    2:48 PM  PHQ 2/9 Scores  PHQ - 2 Score 4 3  PHQ- 9 Score 17 12    Vision:Not within last year  and Dental: No current dental problems and No regular dental care   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   Back pain 04/11/2022   Panniculitis 04/11/2022   GAD (generalized anxiety disorder) 04/21/2021   Depression, recurrent (HCC) 04/21/2021   History of cesarean section 07/24/2020   History of gestational hypertension 07/24/2020   Severe persistent asthma without complication 02/19/2020   Seasonal and perennial allergic rhinoconjunctivitis 01/01/2020   BMI 45.0-49.9, adult  (HCC) 06/29/2019   Anemia 05/21/2019   History of depression 01/28/2019   History of bariatric surgery 01/13/2019   Genital herpes simplex 01/13/2019   Vitamin D deficiency 02/26/2017   Class 3 obesity with serious comorbidity and body mass index (BMI) of 60.0 to 69.9 in adult 02/26/2017   Controlled type 2 diabetes mellitus without complication, without long-term current use of insulin (HCC) 04/21/2016   Gastroesophageal reflux disease without esophagitis 04/21/2016   Pseudotumor cerebri syndrome 11/29/2015   Super obesity 11/29/2015   OSA on CPAP 11/29/2015   RLS (restless legs syndrome) 11/29/2015   Papilledema 06/16/2015   Kienbock disease, 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   Hyperlipidemia associated with type 2 diabetes mellitus (HCC) 01/21/2014   Sinus tachycardia 12/04/2011   Hypertension associated with type 2 diabetes mellitus (HCC) 12/04/2011   Morbid obesity (HCC) 11/07/2011   Past Medical History:  Diagnosis Date   Ankle fracture, left 2000   Anxiety    Arthritis    left wrist and left ankle   Asthma    Back pain    Back pain    Bronchitis    Depression    Diabetes (HCC) 01/11/2016   type II  history of   Family history of adverse reaction to anesthesia    gmother had problems with N/V    GERD (gastroesophageal reflux disease)    Headache    High cholesterol    History of kidney stones    HLD (hyperlipidemia)    HSV-2 infection    Hypertension 2013   Intracranial hypertension    psuedotumor cebrum   Joint pain    Joint pain    Kienbock's disease    Leg edema    OSA (obstructive sleep apnea)    cpap - does not know settings    Papilledema    Prediabetes    Pregnancy induced hypertension    Pseudotumor cerebri syndrome    RLS (restless legs syndrome)    Sinus tachycardia    SOB (shortness of breath)    Past Surgical History:  Procedure Laterality Date   CESAREAN SECTION N/A 07/17/2019   Procedure: CESAREAN SECTION;  Surgeon: Osborn Coho, MD;  Location: [facility];  Service: Obstetrics;  Laterality: N/A;   FRACTURE SURGERY  2005   ankle   FRACTURE SURGERY  2013   wrist (deteriorating bone)   LAPAROSCOPIC GASTRIC SLEEVE RESECTION N/A 05/08/2017   Procedure: LAPAROSCOPIC GASTRIC SLEEVE RESECTION;  Surgeon: Berna Bue, MD;  Location: [facility] ORS;  Service: General;  Laterality: N/A;   LAPAROSCOPIC GASTRIC SLEEVE RESECTION  05/08/2017   UPPER GI  ENDOSCOPY  05/08/2017   Procedure: UPPER GI ENDOSCOPY;  Surgeon: Berna Bue, MD;  Location: [facility] ORS;  Service: General;;   urethra stretch     WRIST SURGERY  2015   Social History   Tobacco Use   Smoking status: Every Day    Packs/day: 1    Types: Cigarettes    Last attempt to quit: 10/05/2018    Years since quitting: 4.0   Smokeless tobacco: Never  Vaping Use   Vaping Use: Never used  Substance Use Topics   Alcohol use: Not Currently   Drug use: Not Currently    Types: Marijuana   Social History   Socioeconomic History   Marital status: Married    Spouse name: Neil Crouch   Number of children: 0   Years of education: 12+   Highest education level: Not on file  Occupational History   Occupation: Engineer, drilling: [employer]  Tobacco Use   Smoking status: Every Day    Packs/day: 1    Types: Cigarettes    Last attempt to quit: 10/05/2018    Years since quitting: 4.0   Smokeless tobacco: Never  Vaping Use   Vaping Use: Never used  Substance and Sexual Activity   Alcohol use: Not Currently   Drug use: Not Currently    Types: Marijuana   Sexual activity: Yes    Birth control/protection: I.U.D.    Comment: condoms previously  Other Topics Concern   Not on file  Social History Narrative   Lives with grandparents   Caffeine use: Drinks coffee/tea/soda- 20oz per day (none after starting diamox)   Social Determinants of Corporate investment banker Strain: Not on file  Food Insecurity: Not on file  Transportation Needs: Not on file  Physical Activity: Not on file  Stress: Not on  file  Social Connections: Not on file  Intimate Partner Violence: Not on file   Family Status  Relation Name Status   Mother  Deceased at age 38   Father  Alive   Brother  Alive   MGM  Deceased   PGM  Deceased   PGF  Deceased   Neg Hx  (Not Specified)   Family History  Problem Relation Age of Onset   Heart attack Mother    Hypertension Mother    Obesity Mother    Depression Mother    Anxiety disorder Mother    Bipolar disorder Mother    Allergic rhinitis Mother    Sleep apnea Mother    Cancer Father    Allergic rhinitis Father    Depression Father    Alcoholism Father    Allergic rhinitis Brother    Allergic rhinitis Maternal Grandmother    Diabetes Paternal Grandmother    Hypertension Paternal Grandmother    Migraines Neg Hx    Asthma Neg Hx    Eczema Neg Hx    Urticaria Neg Hx    Allergies  Allergen Reactions   Cats Claw (Uncaria Tomentosa) Itching, Shortness Of Breath and Swelling   Dust Mite Extract Itching and Shortness Of Breath   Grass Pollen(K-O-R-T-Swt Vern) Itching and Shortness Of Breath   Mixed Feathers Itching, Shortness Of Breath and Swelling   Uncaria Tomentosa (Cats Claw) Itching and Swelling   Gramineae Pollens Other (See Comments)   Lactose Other (See Comments)   Pollen Extract Other (See Comments)      Patient Care Team: Sonny Masters, FNP as PCP - General (Family Medicine)   Outpatient Medications  Prior to Visit  Medication Sig   acetaZOLAMIDE (DIAMOX) 250 MG tablet Take 500 mg by mouth 2 (two) times daily.   albuterol (PROVENTIL) (2.5 MG/3ML) 0.083% nebulizer solution Take 3 mLs (2.5 mg total) by nebulization every 4 (four) hours as needed for wheezing or shortness of breath.   albuterol (VENTOLIN HFA) 108 (90 Base) MCG/ACT inhaler INHALE 2 PUFFS BY MOUTH EVERY 6 HOURS AS NEEDED FOR WHEEZING OR SHORTNESS OF BREATH   azelastine (ASTELIN) 0.1 % nasal spray Place 1 spray into both nostrils daily as needed for rhinitis. Use in each nostril  as directed   Benralizumab (FASENRA PEN) 30 MG/ML SOAJ INJECT 1 PEN UNDER THE SKIN EVERY 8 WEEKS.   famotidine (PEPCID) 20 MG tablet Take by mouth.   FASENRA PEN 30 MG/ML SOAJ INJECT 1 PEN UNDER THE SKIN EVERY 8 WEEKS.   fluticasone (FLONASE) 50 MCG/ACT nasal spray Place 1 spray into both nostrils 2 (two) times daily as needed (nasal congestion).   levocetirizine (XYZAL) 5 MG tablet Take 1 tablet (5 mg total) by mouth every evening.   levonorgestrel (MIRENA, 52 MG,) 20 MCG/24HR IUD Mirena 20 mcg/24 hours (6 yrs) 52 mg intrauterine device  Take 1 device by intrauterine route.   pravastatin (PRAVACHOL) 40 MG tablet Take 1 tablet (40 mg total) by mouth daily.   tirzepatide (MOUNJARO) 10 MG/0.5ML Pen Inject 10 mg into the skin once a week.   [DISCONTINUED] FLUoxetine (PROZAC) 40 MG capsule TAKE 1 CAPSULE (40 MG TOTAL) BY MOUTH DAILY. TAKE WITH 20 MG (TOTAL OF 60 MG DAILY)   [DISCONTINUED] lisinopril-hydrochlorothiazide (ZESTORETIC) 20-25 MG tablet Take 1 tablet by mouth daily.   [DISCONTINUED] NYSTATIN powder Apply topically 2 (two) times daily.   [DISCONTINUED] VRAYLAR 4.5 MG CAPS Take 1 capsule by mouth daily.   NIFEdipine (ADALAT CC) 60 MG 24 hr tablet Take 60 mg by mouth every morning. (Patient not taking: Reported on 10/18/2022)   No facility-administered medications prior to visit.    Review of Systems  Constitutional:  Positive for malaise/fatigue. Negative for chills, diaphoresis, fever and weight loss.  HENT: Negative.    Eyes:  Negative for blurred vision, double vision, photophobia, pain, discharge and redness.  Respiratory:  Negative for cough, hemoptysis, sputum production, shortness of breath and wheezing.   Cardiovascular:  Negative for chest pain, palpitations, orthopnea, claudication, leg swelling and PND.  Gastrointestinal: Negative.   Genitourinary:  Negative for dysuria, flank pain, frequency, hematuria and urgency.  Musculoskeletal: Negative.   Skin: Negative.    Neurological: Negative.   Endo/Heme/Allergies: Negative.   Psychiatric/Behavioral:  Positive for depression. Negative for hallucinations, memory loss, substance abuse and suicidal ideas. The patient is nervous/anxious and has insomnia.   All other systems reviewed and are negative.         Objective:     BP 136/88   Pulse 84   Temp 98.2 F (36.8 C) (Temporal)   Ht 5\' 7"  (1.702 m)   Wt 262 lb 6.4 oz (119 kg)   SpO2 99%   BMI 41.10 kg/m  BP Readings from Last 3 Encounters:  10/18/22 136/88  08/11/22 (!) 163/98  08/08/22 139/89   Wt Readings from Last 3 Encounters:  10/18/22 262 lb 6.4 oz (119 kg)  08/11/22 262 lb (118.8 kg)  08/08/22 262 lb 9.6 oz (119.1 kg)   SpO2 Readings from Last 3 Encounters:  10/18/22 99%  08/11/22 98%  08/08/22 99%      Physical Exam Vitals and nursing note reviewed.  Constitutional:      General: She is not in acute distress.    Appearance: Normal appearance. She is well-developed and well-groomed. She is morbidly obese. She is not ill-appearing, toxic-appearing or diaphoretic.  HENT:     Head: Normocephalic and atraumatic.     Jaw: There is normal jaw occlusion.     Right Ear: Hearing normal.     Left Ear: Hearing normal.     Nose: Nose normal.     Mouth/Throat:     Lips: Pink.     Mouth: Mucous membranes are moist.     Pharynx: Oropharynx is clear. Uvula midline.  Eyes:     General: Lids are normal.     Extraocular Movements: Extraocular movements intact.     Conjunctiva/sclera: Conjunctivae normal.     Pupils: Pupils are equal, round, and reactive to light.  Neck:     Thyroid: No thyroid mass, thyromegaly or thyroid tenderness.     Vascular: No carotid bruit or JVD.     Trachea: Trachea and phonation normal.  Cardiovascular:     Rate and Rhythm: Normal rate and regular rhythm.     Chest Wall: PMI is not displaced.     Pulses: Normal pulses.     Heart sounds: Normal heart sounds. No murmur heard.    No friction rub. No  gallop.  Pulmonary:     Effort: Pulmonary effort is normal. No respiratory distress.     Breath sounds: Normal breath sounds. No wheezing.  Abdominal:     General: Bowel sounds are normal. There is no distension or abdominal bruit.     Palpations: Abdomen is soft. There is no hepatomegaly or splenomegaly.     Tenderness: There is no abdominal tenderness. There is no right CVA tenderness or left CVA tenderness.     Hernia: No hernia is present.  Musculoskeletal:        General: Normal range of motion.     Cervical back: Normal range of motion and neck supple.     Right lower leg: No edema.     Left lower leg: No edema.  Lymphadenopathy:     Cervical: No cervical adenopathy.  Skin:    General: Skin is warm and dry.     Capillary Refill: Capillary refill takes less than 2 seconds.     Coloration: Skin is not cyanotic, jaundiced or pale.     Findings: No rash.  Neurological:     General: No focal deficit present.     Mental Status: She is alert and oriented to person, place, and time.     Sensory: Sensation is intact.     Motor: Motor function is intact.     Coordination: Coordination is intact.     Gait: Gait is intact.     Deep Tendon Reflexes: Reflexes are normal and symmetric.  Psychiatric:        Attention and Perception: Attention and perception normal.        Mood and Affect: Mood and affect normal.        Speech: Speech normal.        Behavior: Behavior normal. Behavior is cooperative.        Thought Content: Thought content normal.        Cognition and Memory: Cognition and memory normal.        Judgment: Judgment normal.      Last CBC Lab Results  Component Value Date   WBC 9.0 08/08/2022   HGB 13.8 08/08/2022  HCT 42.0 08/08/2022   MCV 85 08/08/2022   MCH 27.8 08/08/2022   RDW 12.7 08/08/2022   PLT 264 08/08/2022   Last metabolic panel Lab Results  Component Value Date   GLUCOSE 79 08/08/2022   NA 139 08/08/2022   K 4.6 08/08/2022   CL 103 08/08/2022    CO2 24 08/08/2022   BUN 13 08/08/2022   CREATININE 0.63 08/08/2022   EGFR 122 08/08/2022   CALCIUM 9.4 08/08/2022   PROT 6.8 08/08/2022   ALBUMIN 4.2 08/08/2022   LABGLOB 2.6 08/08/2022   AGRATIO 1.6 08/08/2022   BILITOT 0.5 08/08/2022   ALKPHOS 66 08/08/2022   AST 7 08/08/2022   ALT 8 08/08/2022   ANIONGAP 10 07/17/2019   Last lipids Lab Results  Component Value Date   CHOL 205 (H) 08/08/2022   HDL 37 (L) 08/08/2022   LDLCALC 127 (H) 08/08/2022   TRIG 233 (H) 08/08/2022   CHOLHDL 5.5 (H) 08/08/2022   Last hemoglobin A1c Lab Results  Component Value Date   HGBA1C 5.2 08/08/2022   Last thyroid functions Lab Results  Component Value Date   TSH 1.480 08/08/2022   T3TOTAL 141 02/18/2020   T4TOTAL 8.5 08/08/2022   Last vitamin D Lab Results  Component Value Date   VD25OH 28.1 (L) 08/08/2022   Last vitamin B12 and Folate Lab Results  Component Value Date   VITAMINB12 368 02/14/2017   FOLATE 5.0 02/14/2017        Assessment & Plan:    Routine Health Maintenance and Physical Exam  Immunization History  Administered Date(s) Administered   DTaP 12/08/1991, 02/09/1992, 06/01/1992, 10/01/1992, 11/15/1995   HIB (PRP-OMP) 12/08/1991, 02/09/1992, 06/01/1992, 12/31/1992   HPV 9-valent 05/17/2021, 08/02/2021, 12/16/2021   Hepatitis B 11/07/1991, 12/08/1991, 01/05/1997   IPV 12/08/1991, 02/09/1992, 08/13/1992, 11/14/1995   Influenza,inj,Quad PF,6+ Mos 04/02/2017, 03/20/2019, 05/28/2021, 08/02/2021   Influenza-Unspecified 05/29/2011, 04/02/2017, 03/20/2019   MMR 12/31/1992, 11/14/1995   PFIZER(Purple Top)SARS-COV-2 Vaccination 09/12/2019, 12/10/2019, 05/12/2020, 06/01/2020, 06/12/2020, 05/28/2021   Tdap 05/20/2019    Health Maintenance  Topic Date Due   OPHTHALMOLOGY EXAM  08/26/2016   COVID-19 Vaccine (7 - 2023-24 season) 11/03/2022 (Originally 02/10/2022)   INFLUENZA VACCINE  01/11/2023   HEMOGLOBIN A1C  02/06/2023   Diabetic kidney evaluation - eGFR  measurement  08/09/2023   Diabetic kidney evaluation - Urine ACR  08/09/2023   FOOT EXAM  08/09/2023   PAP SMEAR-Modifier  09/20/2024   DTaP/Tdap/Td (7 - Td or Tdap) 05/19/2029   HPV VACCINES  Completed   Hepatitis C Screening  Completed   HIV Screening  Completed    Discussed health benefits of physical activity, and encouraged her to engage in regular exercise appropriate for her age and condition.  Problem List Items Addressed This Visit       Cardiovascular and Mediastinum   Hypertension associated with type 2 diabetes mellitus (HCC)   Relevant Medications   lisinopril-hydrochlorothiazide (ZESTORETIC) 20-25 MG tablet     Endocrine   Controlled type 2 diabetes mellitus without complication, without long-term current use of insulin (HCC)   Relevant Medications   lisinopril-hydrochlorothiazide (ZESTORETIC) 20-25 MG tablet     Other   Vitamin D deficiency   Relevant Orders   VITAMIN D 25 Hydroxy (Vit-D Deficiency, Fractures)   GAD (generalized anxiety disorder)   Relevant Medications   FLUoxetine (PROZAC) 40 MG capsule   VRAYLAR 4.5 MG CAPS   Depression, recurrent (HCC)   Relevant Medications   FLUoxetine (PROZAC) 40 MG capsule  VRAYLAR 4.5 MG CAPS   Other Visit Diagnoses     Annual physical exam    -  Primary   Relevant Orders   Bayer DCA Hb A1c Waived   CMP14+EGFR   CBC with Differential/Platelet   Lipid panel   Thyroid Panel With TSH   VITAMIN D 25 Hydroxy (Vit-D Deficiency, Fractures)      Return in about 3 months (around 01/18/2023), or if symptoms worsen or fail to improve, for DM.     Kari Baars, FNP-C [facility] [address] [place], [place] [address] [phone]

## 2022-10-19 ENCOUNTER — Encounter: Payer: Self-pay | Admitting: Family Medicine

## 2022-10-19 LAB — CMP14+EGFR
ALT: 11 IU/L (ref 0–32)
AST: 9 IU/L (ref 0–40)
Albumin/Globulin Ratio: 2 (ref 1.2–2.2)
Albumin: 4.5 g/dL (ref 3.9–4.9)
Alkaline Phosphatase: 68 IU/L (ref 44–121)
BUN/Creatinine Ratio: 15 (ref 9–23)
BUN: 11 mg/dL (ref 6–20)
Bilirubin Total: 1 mg/dL (ref 0.0–1.2)
CO2: 20 mmol/L (ref 20–29)
Calcium: 9.6 mg/dL (ref 8.7–10.2)
Chloride: 102 mmol/L (ref 96–106)
Creatinine, Ser: 0.74 mg/dL (ref 0.57–1.00)
Globulin, Total: 2.3 g/dL (ref 1.5–4.5)
Glucose: 85 mg/dL (ref 70–99)
Potassium: 4.2 mmol/L (ref 3.5–5.2)
Sodium: 138 mmol/L (ref 134–144)
Total Protein: 6.8 g/dL (ref 6.0–8.5)
eGFR: 111 mL/min/{1.73_m2} (ref >59)

## 2022-10-19 LAB — CBC WITH DIFFERENTIAL/PLATELET
Basophils Absolute: 0.1 10*3/uL (ref 0.0–0.2)
Basos: 1 %
EOS (ABSOLUTE): 0.6 10*3/uL — ABNORMAL HIGH (ref 0.0–0.4)
Eos: 8 %
Hematocrit: 45.3 % (ref 34.0–46.6)
Hemoglobin: 14.9 g/dL (ref 11.1–15.9)
Immature Grans (Abs): 0 10*3/uL (ref 0.0–0.1)
Immature Granulocytes: 0 %
Lymphocytes Absolute: 2.8 10*3/uL (ref 0.7–3.1)
Lymphs: 38 %
MCH: 27.8 pg (ref 26.6–33.0)
MCHC: 32.9 g/dL (ref 31.5–35.7)
MCV: 85 fL (ref 79–97)
Monocytes Absolute: 0.4 10*3/uL (ref 0.1–0.9)
Monocytes: 6 %
Neutrophils Absolute: 3.5 10*3/uL (ref 1.4–7.0)
Neutrophils: 47 %
Platelets: 248 10*3/uL (ref 150–450)
RBC: 5.36 x10E6/uL — ABNORMAL HIGH (ref 3.77–5.28)
RDW: 12.4 % (ref 11.7–15.4)
WBC: 7.4 10*3/uL (ref 3.4–10.8)

## 2022-10-19 LAB — LIPID PANEL
Chol/HDL Ratio: 5 ratio — ABNORMAL HIGH (ref 0.0–4.4)
Cholesterol, Total: 198 mg/dL (ref 100–199)
HDL: 40 mg/dL (ref >39)
LDL Chol Calc (NIH): 140 mg/dL — ABNORMAL HIGH (ref 0–99)
Triglycerides: 98 mg/dL (ref 0–149)
VLDL Cholesterol Cal: 18 mg/dL (ref 5–40)

## 2022-10-19 LAB — THYROID PANEL WITH TSH
Free Thyroxine Index: 2.6 (ref 1.2–4.9)
T3 Uptake Ratio: 29 % (ref 24–39)
T4, Total: 8.9 ug/dL (ref 4.5–12.0)
TSH: 1.37 u[IU]/mL (ref 0.450–4.500)

## 2022-10-19 LAB — VITAMIN D 25 HYDROXY (VIT D DEFICIENCY, FRACTURES): Vit D, 25-Hydroxy: 34.4 ng/mL (ref 30.0–100.0)

## 2022-10-20 MED ORDER — ATORVASTATIN CALCIUM 40 MG PO TABS: 40.0000 mg | ORAL_TABLET | Freq: Every day | ORAL | 3 refills | Status: AC

## 2022-10-20 NOTE — Addendum Note (Signed)
Addended by: Sonny Masters on: 10/20/2022 10:20 AM   Modules accepted: Orders

## 2022-10-24 ENCOUNTER — Other Ambulatory Visit: Payer: Self-pay | Admitting: *Deleted

## 2022-10-24 ENCOUNTER — Encounter: Payer: Self-pay | Admitting: Allergy

## 2022-10-24 MED ORDER — FASENRA PEN 30 MG/ML ~~LOC~~ SOAJ: SUBCUTANEOUS | 6 refills | Status: DC

## 2022-10-24 NOTE — Telephone Encounter (Signed)
Spoke to patient and Rx sent. °

## 2022-10-25 DIAGNOSIS — R0603 Acute respiratory distress: Secondary | ICD-10-CM | POA: Diagnosis not present

## 2022-10-25 DIAGNOSIS — J069 Acute upper respiratory infection, unspecified: Secondary | ICD-10-CM | POA: Diagnosis not present

## 2022-10-25 DIAGNOSIS — I10 Essential (primary) hypertension: Secondary | ICD-10-CM | POA: Diagnosis not present

## 2022-10-25 DIAGNOSIS — R509 Fever, unspecified: Secondary | ICD-10-CM | POA: Diagnosis not present

## 2022-10-25 DIAGNOSIS — Z1152 Encounter for screening for COVID-19: Secondary | ICD-10-CM | POA: Diagnosis not present

## 2022-10-25 DIAGNOSIS — R059 Cough, unspecified: Secondary | ICD-10-CM | POA: Diagnosis not present

## 2022-10-25 DIAGNOSIS — J45901 Unspecified asthma with (acute) exacerbation: Secondary | ICD-10-CM | POA: Diagnosis not present

## 2022-10-25 DIAGNOSIS — R0602 Shortness of breath: Secondary | ICD-10-CM | POA: Diagnosis not present

## 2022-10-25 MED ORDER — ALBUTEROL SULFATE (2.5 MG/3ML) 0.083% IN NEBU: 2.5000 mg | INHALATION_SOLUTION | RESPIRATORY_TRACT | 1 refills | Status: DC | PRN

## 2022-10-25 MED ORDER — FASENRA PEN 30 MG/ML ~~LOC~~ SOAJ: SUBCUTANEOUS | 6 refills | Status: DC

## 2022-10-25 NOTE — Addendum Note (Signed)
Addended by: Ellamae Sia on: 10/25/2022 02:21 PM   Modules accepted: Orders

## 2022-10-25 NOTE — Telephone Encounter (Signed)
Please call patient and ask where she wants [place] to send the script for albuterol neb.  The nebulizer machine - she can pick up at our office.   Is she having asthma flare?

## 2022-10-25 NOTE — Telephone Encounter (Signed)
Albuterol neb solution sent in

## 2022-10-25 NOTE — Addendum Note (Signed)
Addended by: Devoria Glassing on: 10/25/2022 01:57 PM   Modules accepted: Orders

## 2022-10-25 NOTE — Telephone Encounter (Signed)
Is it ok to give pt albuterol and nebulizer?

## 2022-10-27 ENCOUNTER — Encounter: Payer: Self-pay | Admitting: Allergy

## 2022-11-01 NOTE — Telephone Encounter (Signed)
Per [facility] patients medication sent to [facility]. Tried to call patient to advise but she did not have reception and was going to call back yesterday but did not

## 2022-11-29 NOTE — Progress Notes (Deleted)
Follow Up Note  RE: Cassandra Tucker MRN: [id number] DOB: 01-Oct-1990 Date of Office Visit: 11/30/2022  Referring provider: Sonny Masters, FNP Primary care provider: Sonny Masters, FNP  Chief Complaint: No chief complaint on file.  History of Present Illness: I had the pleasure of seeing Cassandra Tucker for a follow up visit at the [facility] on 11/29/2022. She is a 32 y.o. female, who is being followed for asthma on Fasenra, allergic rhinoconjunctivitis. Her previous allergy office visit was on 05/18/2022 with Dr. Selena Tucker. Today is a regular follow up visit.  Severe persistent asthma without complication Past history - Symptoms of chest tightness, shortness of breath, coughing, wheezing, nocturnal awakenings for 1 year. History of reflux but no longer on PPI. Ex-smoker. 2021 spirometry showed: normal pattern and 13% improvement in FEV1 post bronchodilator treatment. Clinically feeling better. Interim history - Well-controlled with Fasenra injections with no issues. No prednisone, albuterol, Symbicort use.  Today's spirometry was unremarkable - but not able to compare predicted values due to equipment technology issues. Continue Fasenra injections at home every 8 weeks.  Daily controller medication(s): Continue Singulair (montelukast) 10mg  daily at night. During upper respiratory infections/asthma flares:  Start Symbicort 2 puffs twice a day with spacer and rinse mouth afterwards for 1-2 weeks until your breathing symptoms return to baseline.  Pretreat with albuterol 2 puffs or albuterol nebulizer.  If you need to use your albuterol nebulizer machine back to back within 15-30 minutes with no relief then please go to the ER/urgent care for further evaluation.  May use albuterol rescue inhaler 2 puffs every 4 to 6 hours as needed for shortness of breath, chest tightness, coughing, and wheezing. May use albuterol rescue inhaler 2 puffs 5 to 15 minutes prior to strenuous  physical activities. Monitor frequency of use.  Recommend getting the flu vaccine at your PCP's office every fall. Discussed smoking cessation. Get spirometry at next visit.   Seasonal and perennial allergic rhinoconjunctivitis Past history - Perennial rhino conjunctivitis symptoms for 20+ years with worsening in the spring. No previous ENT evaluation. Tried Claritin, Flonase and Zyrtec with some benefit. 2021 skin testing showed: Positive to grass, dust mites, cats, feathers. Negative to common foods.  Interim history - well controlled with Singulair and Xyzal. Continue environmental control measures as below. Continue Singulair (montelukast) 10mg  daily at night.  May use over the counter antihistamines such as Zyrtec (cetirizine), Claritin (loratadine), Allegra (fexofenadine), or Xyzal (levocetirizine) daily. May take it twice a day if needed.  May use Flonase (fluticasone) nasal spray 1 spray per nostril twice a day as needed for nasal congestion.    Return in about 6 months (around 11/17/2022).  Assessment and Plan: Cassandra Tucker is a 32 y.o. female with: No problem-specific Assessment & Plan notes found for this encounter.  No follow-ups on file.  No orders of the defined types were placed in this encounter.  Lab Orders  No laboratory test(s) ordered today    Diagnostics: Spirometry:  Tracings reviewed. Her effort: {Blank single:19197::"Good reproducible efforts.","It was hard to get consistent efforts and there is a question as to whether this reflects a maximal maneuver.","Poor effort, data can not be interpreted."} FVC: ***L FEV1: ***L, ***% predicted FEV1/FVC ratio: ***% Interpretation: {Blank single:19197::"Spirometry consistent with mild obstructive disease","Spirometry consistent with moderate obstructive disease","Spirometry consistent with severe obstructive disease","Spirometry consistent with possible restrictive disease","Spirometry consistent with mixed obstructive and  restrictive disease","Spirometry uninterpretable due to technique","Spirometry consistent with normal pattern","No overt abnormalities noted  given today's efforts"}.  Please see scanned spirometry results for details.  Skin Testing: {Blank single:19197::"Select foods","Environmental allergy panel","Environmental allergy panel and select foods","Food allergy panel","None","Deferred due to recent antihistamines use"}. *** Results discussed with patient/family.   Medication List:  Current Outpatient Medications  Medication Sig Dispense Refill   acetaZOLAMIDE (DIAMOX) 250 MG tablet Take 500 mg by mouth 2 (two) times daily.     albuterol (PROVENTIL) (2.5 MG/3ML) 0.083% nebulizer solution Take 3 mLs (2.5 mg total) by nebulization every 4 (four) hours as needed for wheezing or shortness of breath (coughing fits). 75 mL 1   albuterol (VENTOLIN HFA) 108 (90 Base) MCG/ACT inhaler INHALE 2 PUFFS BY MOUTH EVERY 6 HOURS AS NEEDED FOR WHEEZING OR SHORTNESS OF BREATH 18 g 1   atorvastatin (LIPITOR) 40 MG tablet Take 1 tablet (40 mg total) by mouth daily. 90 tablet 3   azelastine (ASTELIN) 0.1 % nasal spray Place 1 spray into both nostrils daily as needed for rhinitis. Use in each nostril as directed     Benralizumab (FASENRA PEN) 30 MG/ML SOAJ INJECT 1 PEN UNDER THE SKIN EVERY 8 WEEKS. 1 mL 6   famotidine (PEPCID) 20 MG tablet Take by mouth.     FLUoxetine (PROZAC) 40 MG capsule Take 1 capsule (40 mg total) by mouth daily. Take with 20 mg (Total of 60 mg daily) 90 capsule 1   fluticasone (FLONASE) 50 MCG/ACT nasal spray Place 1 spray into both nostrils 2 (two) times daily as needed (nasal congestion). 16 g 5   levocetirizine (XYZAL) 5 MG tablet Take 1 tablet (5 mg total) by mouth every evening. 90 tablet 3   levonorgestrel (MIRENA, 52 MG,) 20 MCG/24HR IUD Mirena 20 mcg/24 hours (6 yrs) 52 mg intrauterine device  Take 1 device by intrauterine route.     lisinopril-hydrochlorothiazide (ZESTORETIC) 20-25 MG  tablet Take 1 tablet by mouth daily. 90 tablet 1   NIFEdipine (ADALAT CC) 60 MG 24 hr tablet Take 60 mg by mouth every morning. (Patient not taking: Reported on 10/18/2022)     tirzepatide [facility]) 10 MG/0.5ML Pen Inject 10 mg into the skin once a week. 6 mL 3   VRAYLAR 4.5 MG CAPS Take 1 capsule (4.5 mg total) by mouth daily. 90 capsule 1   No current facility-administered medications for this visit.   Allergies: Allergies  Allergen Reactions   Cats Claw (Uncaria Tomentosa) Itching, Shortness Of Breath and Swelling   Dust Mite Extract Itching and Shortness Of Breath   Grass Pollen(K-O-R-T-Swt Vern) Itching and Shortness Of Breath   Mixed Feathers Itching, Shortness Of Breath and Swelling   Uncaria Tomentosa (Cats Claw) Itching and Swelling   Gramineae Pollens Other (See Comments)   Lactose Other (See Comments)   Pollen Extract Other (See Comments)   I reviewed her past medical history, social history, family history, and environmental history and no significant changes have been reported from her previous visit.  Review of Systems  Constitutional:  Negative for appetite change, chills, fever and unexpected weight change.  HENT:  Negative for congestion, postnasal drip, rhinorrhea and sneezing.   Eyes:  Negative for itching.  Respiratory:  Negative for cough, chest tightness, shortness of breath and wheezing.   Cardiovascular:  Negative for chest pain.  Gastrointestinal:  Negative for abdominal pain.  Genitourinary:  Negative for difficulty urinating.  Skin:  Negative for rash.  Allergic/Immunologic: Positive for environmental allergies. Negative for food allergies.  Neurological:  Negative for headaches.    Objective: There were no  vitals taken for this visit. There is no height or weight on file to calculate BMI. Physical Exam Vitals and nursing note reviewed.  Constitutional:      Appearance: Normal appearance. She is well-developed. She is obese.  HENT:     Head:  Normocephalic and atraumatic.     Right Ear: Tympanic membrane and external ear normal.     Left Ear: Tympanic membrane and external ear normal.     Nose: Nose normal.     Mouth/Throat:     Mouth: Mucous membranes are moist.     Pharynx: Oropharynx is clear.  Eyes:     Conjunctiva/sclera: Conjunctivae normal.  Cardiovascular:     Rate and Rhythm: Normal rate and regular rhythm.     Heart sounds: Normal heart sounds. No murmur heard.    No friction rub. No gallop.  Pulmonary:     Effort: Pulmonary effort is normal.     Breath sounds: No wheezing, rhonchi or rales.  Musculoskeletal:     Cervical back: Neck supple.  Skin:    General: Skin is warm.     Findings: No rash.  Neurological:     Mental Status: She is alert and oriented to person, place, and time.  Psychiatric:        Behavior: Behavior normal.    Previous notes and tests were reviewed. The plan was reviewed with the patient/family, and all questions/concerned were addressed.  It was my pleasure to see Cassandra Tucker today and participate in her care. Please feel free to contact me with any questions or concerns.  Sincerely,  Wyline Mood, DO Allergy & Immunology  [facility] office: [phone] [facility] office: [phone]

## 2022-11-30 ENCOUNTER — Ambulatory Visit: Payer: Medicaid Other | Admitting: Allergy

## 2022-12-11 ENCOUNTER — Encounter (HOSPITAL_COMMUNITY): Payer: Self-pay | Admitting: *Deleted

## 2022-12-11 ENCOUNTER — Telehealth: Payer: Self-pay

## 2022-12-11 NOTE — Telephone Encounter (Signed)
Pharmacy Patient Advocate Encounter  Received notification from [facility] that Prior Authorization for Vraylar 1.5mg  has been APPROVED from 12/11/2022 to 12/11/2023.Marland Kitchen  PA #/Case ID/Reference #: [id number]  KEY: [id number]

## 2022-12-13 ENCOUNTER — Encounter: Payer: Self-pay | Admitting: Family Medicine

## 2022-12-13 DIAGNOSIS — Z0279 Encounter for issue of other medical certificate: Secondary | ICD-10-CM

## 2022-12-20 ENCOUNTER — Telehealth: Payer: Self-pay | Admitting: Family Medicine

## 2022-12-20 NOTE — Telephone Encounter (Signed)
Patient sent FMLA paperwork through her MyChart to be filled out on 12/13/22.  Front office did not receive this and RX/HH office printed them out and completed them without payment.  Putting note in to document this.  We will call patient to collect.

## 2023-01-16 ENCOUNTER — Ambulatory Visit (INDEPENDENT_AMBULATORY_CARE_PROVIDER_SITE_OTHER): Payer: Medicaid Other | Admitting: Allergy

## 2023-01-16 ENCOUNTER — Encounter: Payer: Self-pay | Admitting: Allergy

## 2023-01-16 ENCOUNTER — Other Ambulatory Visit: Payer: Self-pay

## 2023-01-16 VITALS — BP 110/80 | HR 73 | Temp 97.9°F | Resp 20 | Ht 66.0 in | Wt 262.5 lb

## 2023-01-16 DIAGNOSIS — H1013 Acute atopic conjunctivitis, bilateral: Secondary | ICD-10-CM

## 2023-01-16 DIAGNOSIS — J3089 Other allergic rhinitis: Secondary | ICD-10-CM

## 2023-01-16 DIAGNOSIS — J4550 Severe persistent asthma, uncomplicated: Secondary | ICD-10-CM

## 2023-01-16 DIAGNOSIS — J301 Allergic rhinitis due to pollen: Secondary | ICD-10-CM | POA: Diagnosis not present

## 2023-01-16 DIAGNOSIS — J302 Other seasonal allergic rhinitis: Secondary | ICD-10-CM | POA: Diagnosis not present

## 2023-01-16 DIAGNOSIS — H1010 Acute atopic conjunctivitis, unspecified eye: Secondary | ICD-10-CM

## 2023-01-16 DIAGNOSIS — J3081 Allergic rhinitis due to animal (cat) (dog) hair and dander: Secondary | ICD-10-CM | POA: Diagnosis not present

## 2023-01-16 MED ORDER — ALBUTEROL SULFATE (2.5 MG/3ML) 0.083% IN NEBU: 2.5000 mg | INHALATION_SOLUTION | RESPIRATORY_TRACT | 1 refills | Status: DC | PRN

## 2023-01-16 MED ORDER — CROMOLYN SODIUM 4 % OP SOLN: 1.0000 [drp] | Freq: Four times a day (QID) | OPHTHALMIC | 3 refills | Status: AC | PRN

## 2023-01-16 MED ORDER — LEVOCETIRIZINE DIHYDROCHLORIDE 5 MG PO TABS: 5.0000 mg | ORAL_TABLET | Freq: Every evening | ORAL | 3 refills | Status: AC

## 2023-01-16 MED ORDER — MONTELUKAST SODIUM 10 MG PO TABS
10.0000 mg | ORAL_TABLET | Freq: Every day | ORAL | 3 refills | Status: DC
Start: 2023-01-16 — End: 2023-05-31

## 2023-01-16 MED ORDER — FLUTICASONE PROPIONATE 50 MCG/ACT NA SUSP: 1.0000 | Freq: Two times a day (BID) | NASAL | 5 refills | Status: AC | PRN

## 2023-01-16 MED ORDER — ALBUTEROL SULFATE HFA 108 (90 BASE) MCG/ACT IN AERS: 2.0000 | INHALATION_SPRAY | RESPIRATORY_TRACT | 1 refills | Status: DC | PRN

## 2023-01-16 NOTE — Patient Instructions (Addendum)
Environmental allergies Past skin testing showed: Positive to grass, dust mites, cats, feathers. Continue environmental control measures. Continue Singulair (montelukast) 10mg  daily at night.  May use over the counter antihistamines such as Zyrtec (cetirizine), Claritin (loratadine), Allegra (fexofenadine), or Xyzal (levocetirizine) daily. May take it twice a day if needed.  May use Flonase (fluticasone) nasal spray 1 spray per nostril twice a day as needed for nasal congestion.   Asthma: Normal breathing test today. Continue Fasenra injections at home every 8 weeks.  If you run into problems with not having the medications delivered on time - call our office for a sample.  Daily controller medication(s): Continue Singulair (montelukast) 10mg  daily at night. During respiratory infections/asthma flares:  Start Symbicort 2 puffs twice a day with spacer and rinse mouth afterwards for 1-2 weeks until your breathing symptoms return to baseline.  Pretreat with albuterol 2 puffs or albuterol nebulizer.  If you need to use your albuterol nebulizer machine back to back within 15-30 minutes with no relief then please go to the ER/urgent care for further evaluation.  May use albuterol rescue inhaler 2 puffs or nebulizer every 4 to 6 hours as needed for shortness of breath, chest tightness, coughing, and wheezing. May use albuterol rescue inhaler 2 puffs 5 to 15 minutes prior to strenuous physical activities. Monitor frequency of use - if you need to use it more than twice per week on a consistent basis let us know.  Asthma control goals:  Full participation in all desired activities (may need albuterol before activity) Albuterol use two times or less a week on average (not counting use with activity) Cough interfering with sleep two times or less a month Oral steroids no more than once a year No hospitalizations   Follow up in 6 months or sooner if needed. Recommend getting the flu vaccine at  your PCP's office every fall.

## 2023-01-16 NOTE — Progress Notes (Signed)
Follow Up Note  RE: Cassandra Tucker MRN: [id number] DOB: [birth year] Date of Office Visit: 01/16/2023  Referring provider: Sonny Masters, FNP Primary care provider: Sonny Masters, FNP  Chief Complaint: Follow-up (Asthma has gotten better.)  History of Present Illness: I had the pleasure of seeing Cassandra Tucker for a follow up visit at the [facility] on 01/16/2023. She is a 32 y.o. female, who is being followed for asthma on Fasenra and allergic rhino conjunctivitis. Her previous allergy office visit was on 05/18/2022 with Dr. Selena Batten. Today is a regular follow up visit.  Severe persistent asthma Patient was about 1 week late in May with her Harrington Challenger and noted asthma symptoms such as coughing, wheezing and shortness of breath. She went to the ER and received nebulizer tx x 4. She was prescribed prednisone but didn't take it as her Harrington Challenger came in 2 days afterwards and as soon as she took it her asthma improved.  Now doing much better with Harrington Challenger.  Takes Singulair daily. Used albuterol a few night ago after cleaning her house. Typically uses albuterol 2 times per month.  Didn't use Symbicort for above incident.   Seasonal and perennial allergic rhinoconjunctivitis Controlled with Xyzal and Singulair.  Only uses eye drops and Flonase prn.   Assessment and Plan: Cassandra Tucker is a 32 y.o. female with: Severe persistent asthma without complication  Past history - Symptoms of chest tightness, shortness of breath, coughing, wheezing, nocturnal awakenings for 1 year. History of reflux but no longer on PPI. Ex-smoker. 2021 spirometry showed: normal pattern and 13% improvement in FEV1 post bronchodilator treatment. Clinically feeling better.  Interim history - symptoms flared when late for Fasenra injections. Today's spirometry was normal.  Continue Fasenra injections at home every 8 weeks.  Daily controller medication(s): Continue Singulair (montelukast) 10mg  daily at  night. During respiratory infections/asthma flares:  Start Symbicort 2 puffs twice a day with spacer and rinse mouth afterwards for 1-2 weeks until your breathing symptoms return to baseline.  Pretreat with albuterol 2 puffs or albuterol nebulizer.  If you need to use your albuterol nebulizer machine back to back within 15-30 minutes with no relief then please go to the ER/urgent care for further evaluation.  May use albuterol rescue inhaler 2 puffs or nebulizer every 4 to 6 hours as needed for shortness of breath, chest tightness, coughing, and wheezing. May use albuterol rescue inhaler 2 puffs 5 to 15 minutes prior to strenuous physical activities. Monitor frequency of use - if you need to use it more than twice per week on a consistent basis let us know.  Recommend annual flu vaccine.   Seasonal and perennial allergic rhinoconjunctivitis   Seasonal allergic rhinitis due to pollen   Allergic rhinitis due to dust mite   Allergic rhinitis due to animal dander  Past history - 2021 skin testing showed: Positive to grass, dust mites, cats, feathers. Negative to common foods.  Interim history - well controlled with Singulair and Xyzal. Continue environmental control measures. Continue Singulair (montelukast) 10mg  daily at night.  May use over the counter antihistamines such as Zyrtec (cetirizine), Claritin (loratadine), Allegra (fexofenadine), or Xyzal (levocetirizine) daily. May take it twice a day if needed.  May use Flonase (fluticasone) nasal spray 1 spray per nostril twice a day as needed for nasal congestion.     Return in about 6 months (around 07/19/2023).  Meds ordered this encounter  Medications   montelukast (SINGULAIR) 10 MG tablet  Sig: Take 1 tablet (10 mg total) by mouth at bedtime.    Dispense:  90 tablet    Refill:  3   albuterol (VENTOLIN HFA) 108 (90 Base) MCG/ACT inhaler    Sig: Inhale 2 puffs into the lungs every 4 (four) hours as needed for wheezing or shortness  of breath (coughing fits).    Dispense:  18 g    Refill:  1   levocetirizine (XYZAL) 5 MG tablet    Sig: Take 1 tablet (5 mg total) by mouth every evening.    Dispense:  90 tablet    Refill:  3   albuterol (PROVENTIL) (2.5 MG/3ML) 0.083% nebulizer solution    Sig: Take 3 mLs (2.5 mg total) by nebulization every 4 (four) hours as needed for wheezing or shortness of breath (coughing fits).    Dispense:  75 mL    Refill:  1   fluticasone (FLONASE) 50 MCG/ACT nasal spray    Sig: Place 1 spray into both nostrils 2 (two) times daily as needed (nasal congestion).    Dispense:  16 g    Refill:  5   cromolyn (OPTICROM) 4 % ophthalmic solution    Sig: Place 1 drop into both eyes 4 (four) times daily as needed (itchy/watery eyes).    Dispense:  10 mL    Refill:  3   Lab Orders  No laboratory test(s) ordered today    Diagnostics: Spirometry:  Tracings reviewed. Her effort: Good reproducible efforts. FVC: 3.99L FEV1: 3.14L, 93% predicted FEV1/FVC ratio: 79% Interpretation: Spirometry consistent with normal pattern.  Please see scanned spirometry results for details.  Medication List:  Current Outpatient Medications  Medication Sig Dispense Refill   acetaZOLAMIDE (DIAMOX) 250 MG tablet Take 500 mg by mouth 2 (two) times daily.     albuterol (PROVENTIL) (2.5 MG/3ML) 0.083% nebulizer solution Take 3 mLs (2.5 mg total) by nebulization every 4 (four) hours as needed for wheezing or shortness of breath (coughing fits). 75 mL 1   albuterol (VENTOLIN HFA) 108 (90 Base) MCG/ACT inhaler Inhale 2 puffs into the lungs every 4 (four) hours as needed for wheezing or shortness of breath (coughing fits). 18 g 1   atorvastatin (LIPITOR) 40 MG tablet Take 1 tablet (40 mg total) by mouth daily. 90 tablet 3   azelastine (ASTELIN) 0.1 % nasal spray Place 1 spray into both nostrils daily as needed for rhinitis. Use in each nostril as directed     Benralizumab (FASENRA PEN) 30 MG/ML SOAJ INJECT 1 PEN UNDER THE  SKIN EVERY 8 WEEKS. 1 mL 6   cromolyn (OPTICROM) 4 % ophthalmic solution Place 1 drop into both eyes 4 (four) times daily as needed (itchy/watery eyes). 10 mL 3   famotidine (PEPCID) 20 MG tablet Take by mouth.     fluticasone (FLONASE) 50 MCG/ACT nasal spray Place 1 spray into both nostrils 2 (two) times daily as needed (nasal congestion). 16 g 5   levocetirizine (XYZAL) 5 MG tablet Take 1 tablet (5 mg total) by mouth every evening. 90 tablet 3   levonorgestrel (MIRENA, 52 MG,) 20 MCG/24HR IUD Mirena 20 mcg/24 hours (6 yrs) 52 mg intrauterine device  Take 1 device by intrauterine route.     lisinopril-hydrochlorothiazide (ZESTORETIC) 20-25 MG tablet Take 1 tablet by mouth daily. 90 tablet 1   montelukast (SINGULAIR) 10 MG tablet Take 1 tablet (10 mg total) by mouth at bedtime. 90 tablet 3   tirzepatide (MOUNJARO) 10 MG/0.5ML Pen Inject 10 mg into the  skin once a week. 6 mL 3   VRAYLAR 4.5 MG CAPS Take 1 capsule (4.5 mg total) by mouth daily. 90 capsule 1   NIFEdipine (ADALAT CC) 60 MG 24 hr tablet Take 60 mg by mouth every morning. (Patient not taking: Reported on 10/18/2022)     No current facility-administered medications for this visit.   Allergies: Allergies  Allergen Reactions   Cats Claw (Uncaria Tomentosa) Itching, Shortness Of Breath and Swelling   Dust Mite Extract Itching and Shortness Of Breath   Grass Pollen(K-O-R-T-Swt Vern) Itching and Shortness Of Breath   Mixed Feathers Itching, Shortness Of Breath and Swelling   Uncaria Tomentosa (Cats Claw) Itching and Swelling   Gramineae Pollens Other (See Comments)   Lactose Other (See Comments)   Pollen Extract Other (See Comments)   I reviewed her past medical history, social history, family history, and environmental history and no significant changes have been reported from her previous visit.  Review of Systems  Constitutional:  Negative for appetite change, chills, fever and unexpected weight change.  HENT:  Negative for  congestion, postnasal drip, rhinorrhea and sneezing.   Eyes:  Negative for itching.  Respiratory:  Negative for cough, chest tightness, shortness of breath and wheezing.   Cardiovascular:  Negative for chest pain.  Gastrointestinal:  Negative for abdominal pain.  Genitourinary:  Negative for difficulty urinating.  Skin:  Negative for rash.  Allergic/Immunologic: Positive for environmental allergies. Negative for food allergies.  Neurological:  Negative for headaches.    Objective: BP 110/80   Pulse 73   Temp 97.9 F (36.6 C)   Resp 20   Ht 5\' 6"  (1.676 m)   Wt 262 lb 8 oz (119.1 kg)   SpO2 96%   BMI 42.37 kg/m  Body mass index is 42.37 kg/m. Physical Exam Vitals and nursing note reviewed.  Constitutional:      Appearance: Normal appearance. She is well-developed. She is obese.  HENT:     Head: Normocephalic and atraumatic.     Right Ear: Tympanic membrane and external ear normal.     Left Ear: Tympanic membrane and external ear normal.     Nose: Nose normal.     Mouth/Throat:     Mouth: Mucous membranes are moist.     Pharynx: Oropharynx is clear.  Eyes:     Conjunctiva/sclera: Conjunctivae normal.  Cardiovascular:     Rate and Rhythm: Normal rate and regular rhythm.     Heart sounds: Normal heart sounds. No murmur heard.    No friction rub. No gallop.  Pulmonary:     Effort: Pulmonary effort is normal.     Breath sounds: No wheezing, rhonchi or rales.  Musculoskeletal:     Cervical back: Neck supple.  Skin:    General: Skin is warm.     Findings: No rash.  Neurological:     Mental Status: She is alert and oriented to person, place, and time.  Psychiatric:        Behavior: Behavior normal.    Previous notes and tests were reviewed. The plan was reviewed with the patient/family, and all questions/concerned were addressed.  It was my pleasure to see Henny today and participate in her care. Please feel free to contact me with any questions or  concerns.  Sincerely,  Wyline Mood, DO Allergy & Immunology  [facility] office: [phone] [facility] office: [phone]

## 2023-01-23 ENCOUNTER — Ambulatory Visit: Payer: Medicaid Other | Admitting: Family Medicine

## 2023-01-23 DIAGNOSIS — E1159 Type 2 diabetes mellitus with other circulatory complications: Secondary | ICD-10-CM

## 2023-01-23 DIAGNOSIS — E119 Type 2 diabetes mellitus without complications: Secondary | ICD-10-CM

## 2023-01-23 DIAGNOSIS — E1169 Type 2 diabetes mellitus with other specified complication: Secondary | ICD-10-CM

## 2023-02-01 ENCOUNTER — Ambulatory Visit: Payer: Medicaid Other | Admitting: Family Medicine

## 2023-02-01 ENCOUNTER — Other Ambulatory Visit (HOSPITAL_COMMUNITY): Admission: RE | Admit: 2023-02-01 | Payer: Medicaid Other | Source: Ambulatory Visit

## 2023-02-01 VITALS — BP 134/85 | HR 84 | Temp 97.7°F | Ht 66.0 in | Wt 264.8 lb

## 2023-02-01 DIAGNOSIS — I152 Hypertension secondary to endocrine disorders: Secondary | ICD-10-CM

## 2023-02-01 DIAGNOSIS — Z7985 Long-term (current) use of injectable non-insulin antidiabetic drugs: Secondary | ICD-10-CM | POA: Diagnosis not present

## 2023-02-01 DIAGNOSIS — Z113 Encounter for screening for infections with a predominantly sexual mode of transmission: Secondary | ICD-10-CM | POA: Diagnosis not present

## 2023-02-01 DIAGNOSIS — E1159 Type 2 diabetes mellitus with other circulatory complications: Secondary | ICD-10-CM | POA: Diagnosis not present

## 2023-02-01 DIAGNOSIS — E119 Type 2 diabetes mellitus without complications: Secondary | ICD-10-CM

## 2023-02-01 DIAGNOSIS — E785 Hyperlipidemia, unspecified: Secondary | ICD-10-CM | POA: Diagnosis not present

## 2023-02-01 DIAGNOSIS — K219 Gastro-esophageal reflux disease without esophagitis: Secondary | ICD-10-CM

## 2023-02-01 DIAGNOSIS — E1169 Type 2 diabetes mellitus with other specified complication: Secondary | ICD-10-CM

## 2023-02-01 LAB — BAYER DCA HB A1C WAIVED: HB A1C (BAYER DCA - WAIVED): 5.2 % (ref 4.8–5.6)

## 2023-02-01 MED ORDER — FENOFIBRATE 145 MG PO TABS
145.0000 mg | ORAL_TABLET | Freq: Every day | ORAL | 5 refills | Status: DC
Start: 2023-02-01 — End: 2023-08-28

## 2023-02-01 MED ORDER — OMEPRAZOLE 20 MG PO CPDR
20.0000 mg | DELAYED_RELEASE_CAPSULE | Freq: Every day | ORAL | 3 refills | Status: DC
Start: 2023-02-01 — End: 2023-08-28

## 2023-02-01 MED ORDER — TIRZEPATIDE 12.5 MG/0.5ML ~~LOC~~ SOPN
12.5000 mg | PEN_INJECTOR | SUBCUTANEOUS | 0 refills | Status: AC
Start: 2023-02-01 — End: 2023-02-23

## 2023-02-01 MED ORDER — TIRZEPATIDE 15 MG/0.5ML ~~LOC~~ SOAJ: 15.0000 mg | SUBCUTANEOUS | 3 refills | Status: DC

## 2023-02-01 NOTE — Progress Notes (Signed)
Subjective:  Patient ID: Cassandra GARGER, female    DOB: September 23, 1990, 32 y.o.   MRN: [id number]  Patient Care Team: Sonny Masters, FNP as PCP - General (Family Medicine)   Chief Complaint:  Diabetes (3 month follow up )   HPI: Cassandra Tucker is a 32 y.o. female presenting on 02/01/2023 for Diabetes (3 month follow up )   1. Controlled type 2 diabetes mellitus without complication, without long-term current use of insulin (HCC) Pt has been doing great on Mounjaro and with her diet. She has been trying to be more active lately and states she is feeling better. No polyuria, polyphagia, or polydipsia.   2. Hypertension associated with type 2 diabetes mellitus (HCC) She is compliant with her medications. Denies confusion, weakness, chest pain, palpitations, shortness of breath, or leg swelling.   3. Hyperlipidemia associated with type 2 diabetes mellitus (HCC) Taking medications as prescribed and trying to watch diet. She has not been able to get triglycerides controlled.  4. Screening examination for STI States she has recently broke up with her significant other and would like STI testing. She denies any symptoms.   5. Gastroesophageal reflux disease without esophagitis Reports an increase in symptoms over the last 2 weeks. Worse after certain foods. Has been taking pepcid but still having symptoms at times.      Relevant past medical, surgical, family, and social history reviewed and updated as indicated.  Allergies and medications reviewed and updated. Data reviewed: Chart in Epic.   Past Medical History:  Diagnosis Date   Ankle fracture, left 2000   Anxiety    Arthritis    left wrist and left ankle   Asthma    Back pain    Back pain    Bronchitis    Depression    Diabetes (HCC) 01/11/2016   type II  history of   Family history of adverse reaction to anesthesia    gmother had problems with N/V    GERD (gastroesophageal reflux disease)    Headache    High  cholesterol    History of kidney stones    HLD (hyperlipidemia)    HSV-2 infection    Hypertension 2013   Intracranial hypertension    psuedotumor cebrum   Joint pain    Joint pain    Kienbock's disease    Leg edema    OSA (obstructive sleep apnea)    cpap - does not know settings    Papilledema    Prediabetes    Pregnancy induced hypertension    Pseudotumor cerebri syndrome    RLS (restless legs syndrome)    Sinus tachycardia    SOB (shortness of breath)     Past Surgical History:  Procedure Laterality Date   CESAREAN SECTION N/A 07/17/2019   Procedure: CESAREAN SECTION;  Surgeon: Osborn Coho, MD;  Location: [facility];  Service: Obstetrics;  Laterality: N/A;   FRACTURE SURGERY  2005   ankle   FRACTURE SURGERY  2013   wrist (deteriorating bone)   LAPAROSCOPIC GASTRIC SLEEVE RESECTION N/A 05/08/2017   Procedure: LAPAROSCOPIC GASTRIC SLEEVE RESECTION;  Surgeon: Berna Bue, MD;  Location: [facility];  Service: General;  Laterality: N/A;   LAPAROSCOPIC GASTRIC SLEEVE RESECTION  05/08/2017   UPPER GI ENDOSCOPY  05/08/2017   Procedure: UPPER GI ENDOSCOPY;  Surgeon: Berna Bue, MD;  Location: [facility];  Service: General;;   urethra stretch     WRIST SURGERY  2015  Social History   Socioeconomic History   Marital status: Married    Spouse name: Neil Crouch   Number of children: 0   Years of education: 12+   Highest education level: Some college, no degree  Occupational History   Occupation: Engineer, drilling: [employer]  Tobacco Use   Smoking status: Every Day    Current packs/day: 0.00    Types: Cigarettes    Last attempt to quit: 10/05/2018    Years since quitting: 4.3   Smokeless tobacco: Never  Vaping Use   Vaping status: Never Used  Substance and Sexual Activity   Alcohol use: Not Currently   Drug use: Not Currently    Types: Marijuana   Sexual activity: Yes    Birth control/protection: I.U.D.    Comment: condoms previously  Other Topics Concern    Not on file  Social History Narrative   Lives with grandparents   Caffeine use: Drinks coffee/tea/soda- 20oz per day (none after starting diamox)   Social Determinants of Health   Financial Resource Strain: Medium Risk (02/01/2023)   Overall Financial Resource Strain (CARDIA)    Difficulty of Paying Living Expenses: Somewhat hard  Food Insecurity: No Food Insecurity (02/01/2023)   Hunger Vital Sign    Worried About Running Out of Food in the Last Year: Never true    Ran Out of Food in the Last Year: Never true  Transportation Needs: No Transportation Needs (02/01/2023)   PRAPARE - Administrator, Civil Service (Medical): No    Lack of Transportation (Non-Medical): No  Physical Activity: Unknown (02/01/2023)   Exercise Vital Sign    Days of Exercise per Week: 0 days    Minutes of Exercise per Session: Not on file  Stress: Stress Concern Present (02/01/2023)   [employer] of Occupational Health - Occupational Stress Questionnaire    Feeling of Stress : Very much  Social Connections: Unknown (02/01/2023)   Social Connection and Isolation Panel [NHANES]    Frequency of Communication with Friends and Family: More than three times a week    Frequency of Social Gatherings with Friends and Family: Once a week    Attends Religious Services: Patient declined    Database administrator or Organizations: No    Attends Engineer, structural: Not on file    Marital Status: Separated  Intimate Partner Violence: Not At Risk (01/31/2022)   Received from [facility]   HITS    Over the last 12 months how often did your partner physically hurt you?: 1    Over the last 12 months how often did your partner insult you or talk down to you?: 1    Over the last 12 months how often did your partner threaten you with physical harm?: 1    Over the last 12 months how often did your partner scream or curse at you?: 1    Outpatient Encounter Medications as of 02/01/2023   Medication Sig   acetaZOLAMIDE (DIAMOX) 250 MG tablet Take 500 mg by mouth 2 (two) times daily.   albuterol (PROVENTIL) (2.5 MG/3ML) 0.083% nebulizer solution Take 3 mLs (2.5 mg total) by nebulization every 4 (four) hours as needed for wheezing or shortness of breath (coughing fits).   albuterol (VENTOLIN HFA) 108 (90 Base) MCG/ACT inhaler Inhale 2 puffs into the lungs every 4 (four) hours as needed for wheezing or shortness of breath (coughing fits).   atorvastatin (LIPITOR) 40 MG tablet Take 1 tablet (40  mg total) by mouth daily.   azelastine (ASTELIN) 0.1 % nasal spray Place 1 spray into both nostrils daily as needed for rhinitis. Use in each nostril as directed   Benralizumab (FASENRA PEN) 30 MG/ML SOAJ INJECT 1 PEN UNDER THE SKIN EVERY 8 WEEKS.   cromolyn (OPTICROM) 4 % ophthalmic solution Place 1 drop into both eyes 4 (four) times daily as needed (itchy/watery eyes).   famotidine (PEPCID) 20 MG tablet Take by mouth.   fenofibrate (TRICOR) 145 MG tablet Take 1 tablet (145 mg total) by mouth daily.   fluticasone (FLONASE) 50 MCG/ACT nasal spray Place 1 spray into both nostrils 2 (two) times daily as needed (nasal congestion).   levocetirizine (XYZAL) 5 MG tablet Take 1 tablet (5 mg total) by mouth every evening.   levonorgestrel (MIRENA, 52 MG,) 20 MCG/24HR IUD Mirena 20 mcg/24 hours (6 yrs) 52 mg intrauterine device  Take 1 device by intrauterine route.   lisinopril-hydrochlorothiazide (ZESTORETIC) 20-25 MG tablet Take 1 tablet by mouth daily.   montelukast (SINGULAIR) 10 MG tablet Take 1 tablet (10 mg total) by mouth at bedtime.   omeprazole (PRILOSEC) 20 MG capsule Take 1 capsule (20 mg total) by mouth daily.   tirzepatide (MOUNJARO) 12.5 MG/0.5ML Pen Inject 12.5 mg into the skin once a week for 4 doses.   tirzepatide (MOUNJARO) 15 MG/0.5ML Pen Inject 15 mg into the skin once a week.   VRAYLAR 4.5 MG CAPS Take 1 capsule (4.5 mg total) by mouth daily.   [DISCONTINUED] tirzepatide  (MOUNJARO) 10 MG/0.5ML Pen Inject 10 mg into the skin once a week.   NIFEdipine (ADALAT CC) 60 MG 24 hr tablet Take 60 mg by mouth every morning. (Patient not taking: Reported on 02/01/2023)   [DISCONTINUED] cetirizine (ZYRTEC) 10 MG tablet Take 10 mg by mouth daily.   No facility-administered encounter medications on file as of 02/01/2023.    Allergies  Allergen Reactions   Cats Claw (Uncaria Tomentosa) Itching, Shortness Of Breath and Swelling   Dust Mite Extract Itching and Shortness Of Breath   Grass Pollen(K-O-R-T-Swt Vern) Itching and Shortness Of Breath   Mixed Feathers Itching, Shortness Of Breath and Swelling   Uncaria Tomentosa (Cats Claw) Itching and Swelling   Gramineae Pollens Other (See Comments)   Lactose Other (See Comments)   Pollen Extract Other (See Comments)    Review of Systems  Constitutional:  Negative for activity change, appetite change, chills, diaphoresis, fatigue, fever and unexpected weight change.  HENT: Negative.    Eyes: Negative.  Negative for photophobia and visual disturbance.  Respiratory:  Negative for cough, chest tightness and shortness of breath.   Cardiovascular:  Negative for chest pain, palpitations and leg swelling.  Gastrointestinal:  Negative for abdominal pain, blood in stool, constipation, diarrhea, nausea and vomiting.       GERD  Endocrine: Negative.  Negative for cold intolerance, heat intolerance, polydipsia, polyphagia and polyuria.  Genitourinary:  Negative for decreased urine volume, difficulty urinating, dysuria, frequency, genital sores, pelvic pain, urgency, vaginal bleeding, vaginal discharge and vaginal pain.  Musculoskeletal:  Negative for arthralgias and myalgias.  Skin: Negative.   Allergic/Immunologic: Negative.   Neurological:  Negative for dizziness, tremors, seizures, syncope, facial asymmetry, speech difficulty, weakness, light-headedness, numbness and headaches.  Hematological: Negative.   Psychiatric/Behavioral:   Negative for confusion, hallucinations, sleep disturbance and suicidal ideas.   All other systems reviewed and are negative.       Objective:  BP 134/85   Pulse 84   Temp 97.7  F (36.5 C) (Temporal)   Ht 5\' 6"  (1.676 m)   Wt 264 lb 12.8 oz (120.1 kg)   SpO2 96%   BMI 42.74 kg/m    Wt Readings from Last 3 Encounters:  02/01/23 264 lb 12.8 oz (120.1 kg)  01/16/23 262 lb 8 oz (119.1 kg)  10/18/22 262 lb 6.4 oz (119 kg)    Physical Exam Vitals and nursing note reviewed.  Constitutional:      General: She is not in acute distress.    Appearance: Normal appearance. She is well-developed and well-groomed. She is obese. She is not ill-appearing, toxic-appearing or diaphoretic.  HENT:     Head: Normocephalic and atraumatic.     Jaw: There is normal jaw occlusion.     Right Ear: Hearing normal.     Left Ear: Hearing normal.     Nose: Nose normal.     Mouth/Throat:     Lips: Pink.     Mouth: Mucous membranes are moist.     Pharynx: Oropharynx is clear. Uvula midline.  Eyes:     General: Lids are normal.     Extraocular Movements: Extraocular movements intact.     Conjunctiva/sclera: Conjunctivae normal.     Pupils: Pupils are equal, round, and reactive to light.  Neck:     Thyroid: No thyroid mass, thyromegaly or thyroid tenderness.     Vascular: No carotid bruit or JVD.     Trachea: Trachea and phonation normal.  Cardiovascular:     Rate and Rhythm: Normal rate and regular rhythm.     Chest Wall: PMI is not displaced.     Pulses: Normal pulses.     Heart sounds: Normal heart sounds. No murmur heard.    No friction rub. No gallop.  Pulmonary:     Effort: Pulmonary effort is normal. No respiratory distress.     Breath sounds: Normal breath sounds. No wheezing.  Abdominal:     General: Bowel sounds are normal. There is no distension or abdominal bruit.     Palpations: Abdomen is soft. There is no hepatomegaly or splenomegaly.     Tenderness: There is no abdominal  tenderness. There is no right CVA tenderness or left CVA tenderness.     Hernia: No hernia is present.  Musculoskeletal:        General: Normal range of motion.     Cervical back: Normal range of motion and neck supple.     Right lower leg: No edema.     Left lower leg: No edema.  Lymphadenopathy:     Cervical: No cervical adenopathy.  Skin:    General: Skin is warm and dry.     Capillary Refill: Capillary refill takes less than 2 seconds.     Coloration: Skin is not cyanotic, jaundiced or pale.     Findings: No rash.  Neurological:     General: No focal deficit present.     Mental Status: She is alert and oriented to person, place, and time.     Sensory: Sensation is intact.     Motor: Motor function is intact.     Coordination: Coordination is intact.     Gait: Gait is intact.     Deep Tendon Reflexes: Reflexes are normal and symmetric.  Psychiatric:        Attention and Perception: Attention and perception normal.        Mood and Affect: Mood and affect normal.        Speech: Speech normal.  Behavior: Behavior normal. Behavior is cooperative.        Thought Content: Thought content normal.        Cognition and Memory: Cognition and memory normal.        Judgment: Judgment normal.     Results for orders placed or performed in visit on 10/18/22  Bayer DCA Hb A1c Waived  Result Value Ref Range   HB A1C (BAYER DCA - WAIVED) 5.3 4.8 - 5.6 %  CMP14+EGFR  Result Value Ref Range   Glucose 85 70 - 99 mg/dL   BUN 11 6 - 20 mg/dL   Creatinine, Ser 1.61 0.57 - 1.00 mg/dL   eGFR 096 >04 VW/UJW/1.19   BUN/Creatinine Ratio 15 9 - 23   Sodium 138 134 - 144 mmol/L   Potassium 4.2 3.5 - 5.2 mmol/L   Chloride 102 96 - 106 mmol/L   CO2 20 20 - 29 mmol/L   Calcium 9.6 8.7 - 10.2 mg/dL   Total Protein 6.8 6.0 - 8.5 g/dL   Albumin 4.5 3.9 - 4.9 g/dL   Globulin, Total 2.3 1.5 - 4.5 g/dL   Albumin/Globulin Ratio 2.0 1.2 - 2.2   Bilirubin Total 1.0 0.0 - 1.2 mg/dL   Alkaline  Phosphatase 68 44 - 121 IU/L   AST 9 0 - 40 IU/L   ALT 11 0 - 32 IU/L  CBC with Differential/Platelet  Result Value Ref Range   WBC 7.4 3.4 - 10.8 x10E3/uL   RBC 5.36 (H) 3.77 - 5.28 x10E6/uL   Hemoglobin 14.9 11.1 - 15.9 g/dL   Hematocrit 14.7 82.9 - 46.6 %   MCV 85 79 - 97 fL   MCH 27.8 26.6 - 33.0 pg   MCHC 32.9 31.5 - 35.7 g/dL   RDW 56.2 13.0 - 86.5 %   Platelets 248 150 - 450 x10E3/uL   Neutrophils 47 Not Estab. %   Lymphs 38 Not Estab. %   Monocytes 6 Not Estab. %   Eos 8 Not Estab. %   Basos 1 Not Estab. %   Neutrophils Absolute 3.5 1.4 - 7.0 x10E3/uL   Lymphocytes Absolute 2.8 0.7 - 3.1 x10E3/uL   Monocytes Absolute 0.4 0.1 - 0.9 x10E3/uL   EOS (ABSOLUTE) 0.6 (H) 0.0 - 0.4 x10E3/uL   Basophils Absolute 0.1 0.0 - 0.2 x10E3/uL   Immature Granulocytes 0 Not Estab. %   Immature Grans (Abs) 0.0 0.0 - 0.1 x10E3/uL  Lipid panel  Result Value Ref Range   Cholesterol, Total 198 100 - 199 mg/dL   Triglycerides 98 0 - 149 mg/dL   HDL 40 >78 mg/dL   VLDL Cholesterol Cal 18 5 - 40 mg/dL   LDL Chol Calc (NIH) 469 (H) 0 - 99 mg/dL   Chol/HDL Ratio 5.0 (H) 0.0 - 4.4 ratio  Thyroid Panel With TSH  Result Value Ref Range   TSH 1.370 0.450 - 4.500 uIU/mL   T4, Total 8.9 4.5 - 12.0 ug/dL   T3 Uptake Ratio 29 24 - 39 %   Free Thyroxine Index 2.6 1.2 - 4.9  VITAMIN D 25 Hydroxy (Vit-D Deficiency, Fractures)  Result Value Ref Range   Vit D, 25-Hydroxy 34.4 30.0 - 100.0 ng/mL       Pertinent labs & imaging results that were available during my care of the patient were reviewed by me and considered in my medical decision making.  Assessment & Plan:  Cassandra Tucker was seen today for diabetes.  Diagnoses and all orders for this visit:  Controlled  type 2 diabetes mellitus without complication, without long-term current use of insulin (HCC) Well controlled, will continue to escalate Mounjaor until at optimal dosing. Diet and exercise encouraged.  -     Bayer DCA Hb A1c Waived -      tirzepatide (MOUNJARO) 12.5 MG/0.5ML Pen; Inject 12.5 mg into the skin once a week for 4 doses. -     tirzepatide (MOUNJARO) 15 MG/0.5ML Pen; Inject 15 mg into the skin once a week.  Hypertension associated with type 2 diabetes mellitus (HCC) BP well controlled. Changes were not made in regimen today. Goal BP is 130/80. Pt aware to report any persistent high or low readings. DASH diet and exercise encouraged. Exercise at least 150 minutes per week and increase as tolerated. Goal BMI > 25. Stress management encouraged. Avoid nicotine and tobacco product use. Avoid excessive alcohol and NSAID's. Avoid more than 2000 mg of sodium daily. Medications as prescribed. Follow up as scheduled.  -     Lipid panel -     CBC with Differential/Platelet  Hyperlipidemia associated with type 2 diabetes mellitus (HCC) Diet encouraged - increase intake of fresh fruits and vegetables, increase intake of lean proteins. Bake, broil, or grill foods. Avoid fried, greasy, and fatty foods. Avoid fast foods. Increase intake of fiber-rich whole grains. Exercise encouraged - at least 150 minutes per week and advance as tolerated.  Goal BMI < 25. Continue medications as prescribed. Follow up in 3-6 months as discussed. Add fenofibrate as discussed.  -     fenofibrate (TRICOR) 145 MG tablet; Take 1 tablet (145 mg total) by mouth daily.  Screening examination for STI Testing pending, will treat if warranted.  -     HepB+HepC+HIV Panel -     RPR -     Urine cytology ancillary only  Gastroesophageal reflux disease without esophagitis No red flags present. Diet discussed. Avoid fried, spicy, fatty, greasy, and acidic foods. Avoid caffeine, nicotine, and alcohol. Do not eat 2-3 hours before bedtime and stay upright for at least 1-2 hours after eating. Eat small frequent meals. Avoid NSAID's like motrin and aleve. Medications as prescribed. Report any new or worsening symptoms. Follow up as discussed or sooner if needed.   -      omeprazole (PRILOSEC) 20 MG capsule; Take 1 capsule (20 mg total) by mouth daily.     Continue all other maintenance medications.  Follow up plan: Return in about 3 months (around 05/04/2023), or if symptoms worsen or fail to improve, for chroinc folow up.   Continue healthy lifestyle choices, including diet (rich in fruits, vegetables, and lean proteins, and low in salt and simple carbohydrates) and exercise (at least 30 minutes of moderate physical activity daily).  Educational handout given for DM  The above assessment and management plan was discussed with the patient. The patient verbalized understanding of and has agreed to the management plan. Patient is aware to call the clinic if they develop any new symptoms or if symptoms persist or worsen. Patient is aware when to return to the clinic for a follow-up visit. Patient educated on when it is appropriate to go to the emergency department.   Kari Baars, FNP-C [facility] [phone]

## 2023-02-01 NOTE — Patient Instructions (Addendum)

## 2023-02-02 LAB — RPR: RPR Ser Ql: NONREACTIVE

## 2023-02-02 LAB — CBC WITH DIFFERENTIAL/PLATELET
Basophils Absolute: 0 10*3/uL (ref 0.0–0.2)
Basos: 0 %
EOS (ABSOLUTE): 0 10*3/uL (ref 0.0–0.4)
Eos: 0 %
Hematocrit: 43.9 % (ref 34.0–46.6)
Hemoglobin: 14.2 g/dL (ref 11.1–15.9)
Immature Grans (Abs): 0 10*3/uL (ref 0.0–0.1)
Immature Granulocytes: 0 %
Lymphocytes Absolute: 2.8 10*3/uL (ref 0.7–3.1)
Lymphs: 33 %
MCH: 27.5 pg (ref 26.6–33.0)
MCHC: 32.3 g/dL (ref 31.5–35.7)
MCV: 85 fL (ref 79–97)
Monocytes Absolute: 0.5 10*3/uL (ref 0.1–0.9)
Monocytes: 5 %
Neutrophils Absolute: 5.2 10*3/uL (ref 1.4–7.0)
Neutrophils: 62 %
Platelets: 248 10*3/uL (ref 150–450)
RBC: 5.16 x10E6/uL (ref 3.77–5.28)
RDW: 12.4 % (ref 11.7–15.4)
WBC: 8.5 10*3/uL (ref 3.4–10.8)

## 2023-02-02 LAB — HEPB+HEPC+HIV PANEL
HIV Screen 4th Generation wRfx: NONREACTIVE
Hep B C IgM: NEGATIVE
Hep B Core Total Ab: NEGATIVE
Hep B E Ab: NONREACTIVE
Hep B E Ag: NEGATIVE
Hep B Surface Ab, Qual: REACTIVE
Hep C Virus Ab: NONREACTIVE
Hepatitis B Surface Ag: NEGATIVE

## 2023-02-02 LAB — LIPID PANEL
Chol/HDL Ratio: 4.7 ratio — ABNORMAL HIGH (ref 0.0–4.4)
Cholesterol, Total: 188 mg/dL (ref 100–199)
HDL: 40 mg/dL (ref >39)
LDL Chol Calc (NIH): 114 mg/dL — ABNORMAL HIGH (ref 0–99)
Triglycerides: 193 mg/dL — ABNORMAL HIGH (ref 0–149)
VLDL Cholesterol Cal: 34 mg/dL (ref 5–40)

## 2023-02-05 LAB — URINE CYTOLOGY ANCILLARY ONLY
Chlamydia: NEGATIVE
Comment: NEGATIVE
Comment: NEGATIVE
Comment: NORMAL
Neisseria Gonorrhea: NEGATIVE
Trichomonas: NEGATIVE

## 2023-03-01 ENCOUNTER — Telehealth: Payer: Self-pay | Admitting: Family Medicine

## 2023-03-06 DIAGNOSIS — Z7985 Long-term (current) use of injectable non-insulin antidiabetic drugs: Secondary | ICD-10-CM | POA: Diagnosis not present

## 2023-03-06 DIAGNOSIS — E119 Type 2 diabetes mellitus without complications: Secondary | ICD-10-CM | POA: Diagnosis not present

## 2023-03-06 DIAGNOSIS — G43909 Migraine, unspecified, not intractable, without status migrainosus: Secondary | ICD-10-CM | POA: Diagnosis not present

## 2023-03-06 DIAGNOSIS — Z720 Tobacco use: Secondary | ICD-10-CM | POA: Diagnosis not present

## 2023-03-06 DIAGNOSIS — I10 Essential (primary) hypertension: Secondary | ICD-10-CM | POA: Diagnosis not present

## 2023-03-28 ENCOUNTER — Encounter: Payer: Self-pay | Admitting: Family Medicine

## 2023-03-28 ENCOUNTER — Telehealth: Payer: Medicaid Other | Admitting: Family Medicine

## 2023-03-28 DIAGNOSIS — J069 Acute upper respiratory infection, unspecified: Secondary | ICD-10-CM | POA: Diagnosis not present

## 2023-03-28 DIAGNOSIS — R110 Nausea: Secondary | ICD-10-CM

## 2023-03-28 MED ORDER — GUAIFENESIN ER 600 MG PO TB12
600.0000 mg | ORAL_TABLET | Freq: Two times a day (BID) | ORAL | 0 refills | Status: AC
Start: 2023-03-28 — End: 2023-04-07

## 2023-03-28 MED ORDER — HYDROCOD POLI-CHLORPHE POLI ER 10-8 MG/5ML PO SUER
5.0000 mL | Freq: Every evening | ORAL | 0 refills | Status: DC | PRN
Start: 2023-03-28 — End: 2023-03-28

## 2023-03-28 MED ORDER — ONDANSETRON HCL 4 MG PO TABS: 4.0000 mg | ORAL_TABLET | Freq: Three times a day (TID) | ORAL | 0 refills | Status: DC | PRN

## 2023-03-28 MED ORDER — HYDROCOD POLI-CHLORPHE POLI ER 10-8 MG/5ML PO SUER
5.0000 mL | Freq: Every evening | ORAL | 0 refills | Status: DC | PRN
Start: 2023-03-28 — End: 2023-04-25

## 2023-03-28 MED ORDER — PREDNISONE 20 MG PO TABS
40.0000 mg | ORAL_TABLET | Freq: Every day | ORAL | 0 refills | Status: AC
Start: 2023-03-28 — End: 2023-04-02

## 2023-03-28 NOTE — Progress Notes (Signed)
Virtual Visit via Video   I connected with patient on 03/28/23 at 0810 by a video enabled telemedicine application and verified that I am speaking with the correct person using two identifiers.  Location patient: Home Location provider: [facility] Persons participating in the virtual visit: Patient and Provider  I discussed the limitations of evaluation and management by telemedicine and the availability of in person appointments. The patient expressed understanding and agreed to proceed.  Subjective:   HPI:  Pt presents today for  Chief Complaint  Patient presents with   Cough   Cough This is a new problem. The current episode started 1 to 4 weeks ago. The problem has been gradually worsening. The problem occurs constantly. The cough is Non-productive. Associated symptoms include chills, nasal congestion, postnasal drip, rhinorrhea and wheezing. Pertinent negatives include no chest pain, ear congestion, ear pain, fever, headaches, heartburn, hemoptysis, myalgias, rash, sore throat, shortness of breath, sweats or weight loss. She has tried a beta-agonist inhaler and OTC cough suppressant for the symptoms. The treatment provided mild relief. Her past medical history is significant for asthma.     Review of Systems  Constitutional:  Positive for chills. Negative for diaphoresis, fever, malaise/fatigue and weight loss.  HENT:  Positive for congestion, postnasal drip and rhinorrhea. Negative for ear discharge, ear pain, hearing loss, nosebleeds, sinus pain, sore throat and tinnitus.   Respiratory:  Positive for cough and wheezing. Negative for hemoptysis, sputum production, shortness of breath and stridor.   Cardiovascular:  Negative for chest pain, palpitations, orthopnea, claudication, leg swelling and PND.  Gastrointestinal:  Negative for heartburn.  Musculoskeletal:  Negative for myalgias.  Skin:  Negative for rash.  Neurological:  Negative for  tingling, tremors, sensory change, speech change, focal weakness, seizures, loss of consciousness, weakness and headaches.  All other systems reviewed and are negative.    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   Back pain 04/11/2022   Panniculitis 04/11/2022   GAD (generalized anxiety disorder) 04/21/2021   Depression, recurrent (HCC) 04/21/2021   History of cesarean section 07/24/2020   History of gestational hypertension 07/24/2020   Severe persistent asthma without complication 02/19/2020   Seasonal and perennial allergic rhinoconjunctivitis 01/01/2020   BMI 45.0-49.9, adult (HCC) 06/29/2019   Anemia 05/21/2019   History of depression 01/28/2019   History of bariatric surgery 01/13/2019   Genital herpes simplex 01/13/2019   Vitamin D deficiency 02/26/2017   Class 3 obesity with serious comorbidity and body mass index (BMI) of 60.0 to 69.9 in adult 02/26/2017   Controlled type 2 diabetes mellitus without complication, without long-term current use of insulin (HCC) 04/21/2016   Gastroesophageal reflux disease without esophagitis 04/21/2016   Pseudotumor cerebri syndrome 11/29/2015   Super obesity 11/29/2015   OSA on CPAP 11/29/2015   RLS (restless legs syndrome) 11/29/2015   Papilledema 06/16/2015   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   Hyperlipidemia associated with type 2 diabetes mellitus (HCC) 01/21/2014   Sinus tachycardia 12/04/2011   Hypertension associated with type 2 diabetes mellitus (HCC) 12/04/2011   Morbid obesity (HCC) 11/07/2011    Social History   Tobacco Use   Smoking status: Every Day    Current packs/day: 0.00    Types: Cigarettes    Last attempt to quit: 10/05/2018    Years since quitting: 4.4   Smokeless tobacco: Never  Substance Use Topics   Alcohol use: Not  Currently    Current Outpatient Medications:    guaiFENesin (MUCINEX) 600 MG 12 hr tablet, Take 1  tablet (600 mg total) by mouth 2 (two) times daily for 10 days., Disp: 20 tablet, Rfl: 0   predniSONE (DELTASONE) 20 MG tablet, Take 2 tablets (40 mg total) by mouth daily with breakfast for 5 days., Disp: 10 tablet, Rfl: 0   acetaZOLAMIDE (DIAMOX) 250 MG tablet, Take 500 mg by mouth 2 (two) times daily., Disp: , Rfl:    albuterol (PROVENTIL) (2.5 MG/3ML) 0.083% nebulizer solution, Take 3 mLs (2.5 mg total) by nebulization every 4 (four) hours as needed for wheezing or shortness of breath (coughing fits)., Disp: 75 mL, Rfl: 1   albuterol (VENTOLIN HFA) 108 (90 Base) MCG/ACT inhaler, Inhale 2 puffs into the lungs every 4 (four) hours as needed for wheezing or shortness of breath (coughing fits)., Disp: 18 g, Rfl: 1   atorvastatin (LIPITOR) 40 MG tablet, Take 1 tablet (40 mg total) by mouth daily., Disp: 90 tablet, Rfl: 3   azelastine (ASTELIN) 0.1 % nasal spray, Place 1 spray into both nostrils daily as needed for rhinitis. Use in each nostril as directed, Disp: , Rfl:    Benralizumab (FASENRA PEN) 30 MG/ML SOAJ, INJECT 1 PEN UNDER THE SKIN EVERY 8 WEEKS., Disp: 1 mL, Rfl: 6   chlorpheniramine-HYDROcodone (TUSSIONEX) 10-8 MG/5ML, Take 5 mLs by mouth at bedtime as needed for cough., Disp: 70 mL, Rfl: 0   cromolyn (OPTICROM) 4 % ophthalmic solution, Place 1 drop into both eyes 4 (four) times daily as needed (itchy/watery eyes)., Disp: 10 mL, Rfl: 3   famotidine (PEPCID) 20 MG tablet, Take by mouth., Disp: , Rfl:    fenofibrate (TRICOR) 145 MG tablet, Take 1 tablet (145 mg total) by mouth daily., Disp: 30 tablet, Rfl: 5   fluticasone (FLONASE) 50 MCG/ACT nasal spray, Place 1 spray into both nostrils 2 (two) times daily as needed (nasal congestion)., Disp: 16 g, Rfl: 5   levocetirizine (XYZAL) 5 MG tablet, Take 1 tablet (5 mg total) by mouth every evening., Disp: 90 tablet, Rfl: 3   levonorgestrel (MIRENA, 52 MG,) 20 MCG/24HR IUD, Mirena 20 mcg/24 hours (6 yrs) 52 mg intrauterine device  Take 1 device by  intrauterine route., Disp: , Rfl:    lisinopril-hydrochlorothiazide (ZESTORETIC) 20-25 MG tablet, Take 1 tablet by mouth daily., Disp: 90 tablet, Rfl: 1   montelukast (SINGULAIR) 10 MG tablet, Take 1 tablet (10 mg total) by mouth at bedtime., Disp: 90 tablet, Rfl: 3   NIFEdipine (ADALAT CC) 60 MG 24 hr tablet, Take 60 mg by mouth every morning. (Patient not taking: Reported on 02/01/2023), Disp: , Rfl:    omeprazole (PRILOSEC) 20 MG capsule, Take 1 capsule (20 mg total) by mouth daily., Disp: 30 capsule, Rfl: 3   tirzepatide (MOUNJARO) 15 MG/0.5ML Pen, Inject 15 mg into the skin once a week., Disp: 6 mL, Rfl: 3   VRAYLAR 4.5 MG CAPS, Take 1 capsule (4.5 mg total) by mouth daily., Disp: 90 capsule, Rfl: 1  Allergies  Allergen Reactions   Cats Claw (Uncaria Tomentosa) Itching, Shortness Of Breath and Swelling   Dust Mite Extract Itching and Shortness Of Breath   Grass Pollen(K-O-R-T-Swt Vern) Itching and Shortness Of Breath   Mixed Feathers Itching, Shortness Of Breath and Swelling   Uncaria Tomentosa (Cats Claw) Itching and Swelling   Gramineae Pollens Other (See Comments)   Lactose Other (See Comments)   Pollen Extract Other (See Comments)    Objective:  There were no vitals taken for this visit.  Patient is well-developed, well-nourished in no acute distress.  Resting comfortably at home.  Head is normocephalic, atraumatic.  No labored breathing.  Speech is clear and coherent with logical content.  Patient is alert and oriented at baseline.  Congested cough noted.   Assessment and Plan:   Cassandra Tucker was seen today for cough.  Diagnoses and all orders for this visit:  URI with cough and congestion No indications of acute bacterial infection. Will treat with below. Pt to continue SABA at home. Aware to continue symptomatic management at home. If symptoms persist or worsen, pt to notify provider.  -     predniSONE (DELTASONE) 20 MG tablet; Take 2 tablets (40 mg total) by mouth  daily with breakfast for 5 days. -     guaiFENesin (MUCINEX) 600 MG 12 hr tablet; Take 1 tablet (600 mg total) by mouth 2 (two) times daily for 10 days. -     chlorpheniramine-HYDROcodone (TUSSIONEX) 10-8 MG/5ML; Take 5 mLs by mouth at bedtime as needed for cough.      Return if symptoms worsen or fail to improve.  Kari Baars, FNP-C [facility] [address] [place], [place] [address] [phone]  03/28/2023  Time spent with the patient: 15 minutes, of which >50% was spent in obtaining information about symptoms, reviewing previous labs, evaluations, and treatments, counseling about condition (please see the discussed topics above), and developing a plan to further investigate it; had a number of questions which I addressed.

## 2023-03-28 NOTE — Addendum Note (Signed)
Addended by: Sonny Masters on: 03/28/2023 12:29 PM   Modules accepted: Orders

## 2023-04-24 ENCOUNTER — Other Ambulatory Visit: Payer: Self-pay

## 2023-04-24 ENCOUNTER — Encounter: Payer: Self-pay | Admitting: Allergy

## 2023-04-24 MED ORDER — ALBUTEROL SULFATE (2.5 MG/3ML) 0.083% IN NEBU: 2.5000 mg | INHALATION_SOLUTION | RESPIRATORY_TRACT | 1 refills | Status: DC | PRN

## 2023-04-25 ENCOUNTER — Telehealth: Payer: Medicaid Other | Admitting: Family Medicine

## 2023-04-25 ENCOUNTER — Ambulatory Visit: Payer: Self-pay | Admitting: Family Medicine

## 2023-04-25 ENCOUNTER — Encounter: Payer: Self-pay | Admitting: Family Medicine

## 2023-04-25 DIAGNOSIS — J209 Acute bronchitis, unspecified: Secondary | ICD-10-CM | POA: Diagnosis not present

## 2023-04-25 MED ORDER — PROMETHAZINE-DM 6.25-15 MG/5ML PO SYRP
5.0000 mL | ORAL_SOLUTION | Freq: Four times a day (QID) | ORAL | 0 refills | Status: DC | PRN
Start: 2023-04-25 — End: 2023-05-22

## 2023-04-25 MED ORDER — BENZONATATE 100 MG PO CAPS
100.0000 mg | ORAL_CAPSULE | Freq: Three times a day (TID) | ORAL | 0 refills | Status: DC | PRN
Start: 2023-04-25 — End: 2023-05-31

## 2023-04-25 MED ORDER — AMOXICILLIN-POT CLAVULANATE 875-125 MG PO TABS: 1.0000 | ORAL_TABLET | Freq: Two times a day (BID) | ORAL | 0 refills | Status: AC

## 2023-04-25 MED ORDER — GUAIFENESIN ER 600 MG PO TB12
600.0000 mg | ORAL_TABLET | Freq: Two times a day (BID) | ORAL | 0 refills | Status: AC
Start: 2023-04-25 — End: 2023-05-02

## 2023-04-25 MED ORDER — ALBUTEROL SULFATE (2.5 MG/3ML) 0.083% IN NEBU: 2.5000 mg | INHALATION_SOLUTION | RESPIRATORY_TRACT | 0 refills | Status: DC | PRN

## 2023-04-25 NOTE — Patient Instructions (Addendum)
Cassandra Tucker, thank you for joining Freddy Finner, NP for today's virtual visit.  While this provider is not your primary care provider (PCP), if your PCP is located in our provider database this encounter information will be shared with them immediately following your visit.   A [facility] MyChart account gives you access to today's visit and all your visits, tests, and labs performed at [facility] " click here if you don't have a [facility] MyChart account or go to [email or web address]  Consent: (Patient) Cassandra Tucker provided verbal consent for this virtual visit at the beginning of the encounter.  Current Medications:  Current Outpatient Medications:    amoxicillin-clavulanate (AUGMENTIN) 875-125 MG tablet, Take 1 tablet by mouth 2 (two) times daily for 7 days., Disp: 14 tablet, Rfl: 0   benzonatate (TESSALON) 100 MG capsule, Take 1 capsule (100 mg total) by mouth 3 (three) times daily as needed for cough., Disp: 30 capsule, Rfl: 0   guaiFENesin (MUCINEX) 600 MG 12 hr tablet, Take 1 tablet (600 mg total) by mouth 2 (two) times daily for 7 days., Disp: 14 tablet, Rfl: 0   promethazine-dextromethorphan (PROMETHAZINE-DM) 6.25-15 MG/5ML syrup, Take 5 mLs by mouth 4 (four) times daily as needed for cough., Disp: 118 mL, Rfl: 0   acetaZOLAMIDE (DIAMOX) 250 MG tablet, Take 500 mg by mouth 2 (two) times daily., Disp: , Rfl:    albuterol (PROVENTIL) (2.5 MG/3ML) 0.083% nebulizer solution, Take 3 mLs (2.5 mg total) by nebulization every 4 (four) hours as needed for wheezing or shortness of breath (coughing fits)., Disp: 75 mL, Rfl: 0   atorvastatin (LIPITOR) 40 MG tablet, Take 1 tablet (40 mg total) by mouth daily., Disp: 90 tablet, Rfl: 3   azelastine (ASTELIN) 0.1 % nasal spray, Place 1 spray into both nostrils daily as needed for rhinitis. Use in each nostril as directed, Disp: , Rfl:    Benralizumab (FASENRA PEN) 30 MG/ML SOAJ, INJECT 1 PEN UNDER THE SKIN EVERY 8  WEEKS., Disp: 1 mL, Rfl: 6   cromolyn (OPTICROM) 4 % ophthalmic solution, Place 1 drop into both eyes 4 (four) times daily as needed (itchy/watery eyes)., Disp: 10 mL, Rfl: 3   famotidine (PEPCID) 20 MG tablet, Take by mouth., Disp: , Rfl:    fenofibrate (TRICOR) 145 MG tablet, Take 1 tablet (145 mg total) by mouth daily., Disp: 30 tablet, Rfl: 5   fluticasone (FLONASE) 50 MCG/ACT nasal spray, Place 1 spray into both nostrils 2 (two) times daily as needed (nasal congestion)., Disp: 16 g, Rfl: 5   levocetirizine (XYZAL) 5 MG tablet, Take 1 tablet (5 mg total) by mouth every evening., Disp: 90 tablet, Rfl: 3   levonorgestrel (MIRENA, 52 MG,) 20 MCG/24HR IUD, Mirena 20 mcg/24 hours (6 yrs) 52 mg intrauterine device  Take 1 device by intrauterine route., Disp: , Rfl:    lisinopril-hydrochlorothiazide (ZESTORETIC) 20-25 MG tablet, Take 1 tablet by mouth daily., Disp: 90 tablet, Rfl: 1   montelukast (SINGULAIR) 10 MG tablet, Take 1 tablet (10 mg total) by mouth at bedtime., Disp: 90 tablet, Rfl: 3   NIFEdipine (ADALAT CC) 60 MG 24 hr tablet, Take 60 mg by mouth every morning. (Patient not taking: Reported on 02/01/2023), Disp: , Rfl:    omeprazole (PRILOSEC) 20 MG capsule, Take 1 capsule (20 mg total) by mouth daily., Disp: 30 capsule, Rfl: 3   ondansetron (ZOFRAN) 4 MG tablet, Take 1 tablet (4 mg total) by mouth every 8 (eight) hours as needed for  nausea or vomiting., Disp: 20 tablet, Rfl: 0   tirzepatide (MOUNJARO) 15 MG/0.5ML Pen, Inject 15 mg into the skin once a week., Disp: 6 mL, Rfl: 3   VRAYLAR 4.5 MG CAPS, Take 1 capsule (4.5 mg total) by mouth daily., Disp: 90 capsule, Rfl: 1   Medications ordered in this encounter:  Meds ordered this encounter  Medications   guaiFENesin (MUCINEX) 600 MG 12 hr tablet    Sig: Take 1 tablet (600 mg total) by mouth 2 (two) times daily for 7 days.    Dispense:  14 tablet    Refill:  0    Order Specific Question:   Supervising Provider    Answer:   Merrilee Jansky [id number]   promethazine-dextromethorphan (PROMETHAZINE-DM) 6.25-15 MG/5ML syrup    Sig: Take 5 mLs by mouth 4 (four) times daily as needed for cough.    Dispense:  118 mL    Refill:  0    Order Specific Question:   Supervising Provider    Answer:   Merrilee Jansky [[id number]]   benzonatate (TESSALON) 100 MG capsule    Sig: Take 1 capsule (100 mg total) by mouth 3 (three) times daily as needed for cough.    Dispense:  30 capsule    Refill:  0    Order Specific Question:   Supervising Provider    Answer:   Merrilee Jansky [id number]   amoxicillin-clavulanate (AUGMENTIN) 875-125 MG tablet    Sig: Take 1 tablet by mouth 2 (two) times daily for 7 days.    Dispense:  14 tablet    Refill:  0    Order Specific Question:   Supervising Provider    Answer:   Merrilee Jansky [[id number]]   albuterol (PROVENTIL) (2.5 MG/3ML) 0.083% nebulizer solution    Sig: Take 3 mLs (2.5 mg total) by nebulization every 4 (four) hours as needed for wheezing or shortness of breath (coughing fits).    Dispense:  75 mL    Refill:  0    Order Specific Question:   Supervising Provider    Answer:   Merrilee Jansky [[id number]]     *If you need refills on other medications prior to your next appointment, please contact your pharmacy*  Follow-Up: Call back or seek an in-person evaluation if the symptoms worsen or if the condition fails to improve as anticipated.  [facility] [phone]  Other Instructions  -Make follow up- might need to adjust asthma medications for fall and winter times.  - Take meds as prescribed - Rest voice - Use a cool mist humidifier especially during the winter months when heat dries out the air. - Use saline nose sprays frequently to help soothe nasal passages if they are drying out. - Stay hydrated by drinking plenty of fluids - Keep thermostat turn down low to prevent drying out which can cause a dry cough.  - For fever or aches or pains- take tylenol or  ibuprofen as directed on bottle             * for fevers greater than 101 orally you may alternate ibuprofen and tylenol every 3 hours.  If you do not improve you will need a follow up visit in person.                  If you have been instructed to have an in-person evaluation today at a local Urgent Care facility, please use the link below. It will  take you to a list of all of our available [facility], including address, phone number and hours of operation. Please do not delay care.  [facility]  If you or a family member do not have a primary care provider, use the link below to schedule a visit and establish care. When you choose a [facility] primary care physician or advanced practice provider, you gain a long-term partner in health. Find a Primary Care Provider  Learn more about [facility] in-office and virtual care options:  - Get Care Now

## 2023-04-25 NOTE — Telephone Encounter (Signed)
Copied from CRM [id number]. Topic: Appointments - Appointment Scheduling >> Apr 25, 2023  8:30 AM [place] Nickel wrote: Patient/patient representative is calling to schedule an appointment. Refer to attachments for appointment information.   Chief Complaint: asthma flare especially at night Symptoms: difficulty breathing at night, sinus issues, productive cough, nasal drainage Frequency: ongoing Pertinent Negatives: Patient denies chest pain Disposition: [] ED /[] Urgent Care (no appt availability in office) / [] Appointment(In office/virtual)/ [x]  [facility]/ [] Home Care/ [] Refused Recommended Disposition /[] [facility]/ []  Follow-up with PCP Additional Notes: pt c/o asthma flare up especially at night unrelieved by albuterol 2 puffs as directed. Productive cough: white sputum: Pt requested telehealth appt after no available appts with PCP: telehealth appt set for today at 9:30 am Reason for Disposition  [1] Continuous (nonstop) coughing AND [2] keeps from working or sleeping AND [3] not improved after 2 or 3 inhaler or nebulizer treatments given 20 minutes apart  Answer Assessment - Initial Assessment Questions 1. RESPIRATORY STATUS: "Describe your breathing?" (e.g., wheezing, shortness of breath, unable to speak, severe coughing)  Diffuculty  breathing at night  2. ONSET: "When did this asthma attack begin?"      Week 3. TRIGGER: "What do you think triggered this attack?" (e.g., URI, exposure to pollen or other allergen, tobacco smoke)  by sinus issues and cold issues     4. PEAK EXPIRATORY FLOW RATE (PEFR): "Do you use a peak flow meter?" If Yes, ask: "What's the current peak flow? What's your personal best peak flow?"      no 5. SEVERITY: "How bad is this attack?"    - MILD: No SOB at rest, mild SOB with walking, speaks normally in sentences, can lie down, no retractions, pulse < 100. (GREEN Zone: PEFR 80-100%)   - MODERATE: SOB at rest, SOB with minimal exertion and prefers  to sit, cannot lie down flat, speaks in phrases, mild retractions, audible wheezing, pulse 100-120. (YELLOW Zone: PEFR 50-79%)    - SEVERE: Struggling for each breath, speaks in single words, struggling to breathe, sitting hunched forward, retractions, usually loud wheezing, sometimes minimal wheezing because of decreased air movement, pulse > 120. (RED Zone: PEFR < 50%).      Wheezing and SOB at night 6. ASTHMA MEDICINES:  "What treatments have you tried?"    - INHALED QUICK RELIEF (RESCUE): "What is your inhaled quick-relief medicine?" (e.g., albuterol, salbutamol) "Do you use an inhaler or a nebulizer?" "How frequently have you been using this medicine?"   - CONTROLLER (LONG-TERM-CONTROL): "Do you take an inhaled steroid? (e.g., Asmanex, Flovent, Pulmicort, Qvar)     Albuterol inhaler used at night and only temporary relief 7. INHALED QUICK-RELIEF TREATMENTS FOR THIS ATTACK: "What treatments have you given yourself so far?" and "How many and how often?" If using an inhaler, ask, "How many puffs?" Note: Routine treatments are 2 puffs every 4 hours as needed. Rescue treatments are 4 puffs repeated every 20 minutes, up to three times as needed.       8. OTHER SYMPTOMS: "Do you have any other symptoms? (e.g., chest pain, coughing up yellow sputum, fever, runny nose)     White sputum 9. O2 SATURATION MONITOR:  "Do you use an oxygen saturation monitor (pulse oximeter) at home?" If Yes, "What is your reading (oxygen level) today?" "What is your usual oxygen saturation reading?" (e.g., 95%)     unknown 10. PREGNANCY: "Is there any chance you are pregnant?" "When was your last menstrual period?"  no  Protocols used: Asthma Attack-A-AH

## 2023-04-25 NOTE — Progress Notes (Signed)
Virtual Visit Consent   Cassandra Tucker, you are scheduled for a virtual visit with a [facility] provider today. Just as with appointments in the office, your consent must be obtained to participate. Your consent will be active for this visit and any virtual visit you may have with one of our providers in the next 365 days. If you have a MyChart account, a copy of this consent can be sent to you electronically.  As this is a virtual visit, video technology does not allow for your provider to perform a traditional examination. This may limit your provider's ability to fully assess your condition. If your provider identifies any concerns that need to be evaluated in person or the need to arrange testing (such as labs, EKG, etc.), we will make arrangements to do so. Although advances in technology are sophisticated, we cannot ensure that it will always work on either your end or our end. If the connection with a video visit is poor, the visit may have to be switched to a telephone visit. With either a video or telephone visit, we are not always able to ensure that we have a secure connection.  By engaging in this virtual visit, you consent to the provision of healthcare and authorize for your insurance to be billed (if applicable) for the services provided during this visit. Depending on your insurance coverage, you may receive a charge related to this service.  I need to obtain your verbal consent now. Are you willing to proceed with your visit today? Cassandra Tucker has provided verbal consent on 04/25/2023 for a virtual visit (video or telephone). Freddy Finner, NP  Date: 04/25/2023 9:31 AM  Virtual Visit via Video Note   I, Freddy Finner, connected with  Cassandra Tucker  ([id number], 12/10/1990) on 04/25/23 at  9:30 AM EST by a video-enabled telemedicine application and verified that I am speaking with the correct person using two identifiers.  Location: Patient: Virtual Visit Location  Patient: Home Provider: Virtual Visit Location Provider: Home Office   I discussed the limitations of evaluation and management by telemedicine and the availability of in person appointments. The patient expressed understanding and agreed to proceed.    History of Present Illness: Cassandra Tucker is a 32 y.o. who identifies as a female who was assigned female at birth, and is being seen today for   Onset was Sunday this week after she was treated a month ago for a URI- pred, cough meds, and mucinex (reports she got better then got worse again, after her daughter got sick) Associated symptoms are cough- with occasional mucus, congestion, sore throat at times mostly on Monday, chills,  Modifying factors are nebs, and inhaler Denies chest pain, fevers  Had URI in mid Oct- was given pred at that time.  Problems:  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   Back pain 04/11/2022   Panniculitis 04/11/2022   GAD (generalized anxiety disorder) 04/21/2021   Depression, recurrent (HCC) 04/21/2021   History of cesarean section 07/24/2020   History of gestational hypertension 07/24/2020   Severe persistent asthma without complication 02/19/2020   Seasonal and perennial allergic rhinoconjunctivitis 01/01/2020   BMI 45.0-49.9, adult (HCC) 06/29/2019   Anemia 05/21/2019   History of depression 01/28/2019   History of bariatric surgery 01/13/2019   Genital herpes simplex 01/13/2019   Vitamin D deficiency 02/26/2017   Class 3 obesity with  serious comorbidity and body mass index (BMI) of 60.0 to 69.9 in adult 02/26/2017   Controlled type 2 diabetes mellitus without complication, without long-term current use of insulin (HCC) 04/21/2016   Gastroesophageal reflux disease without esophagitis 04/21/2016   Pseudotumor cerebri syndrome 11/29/2015   Super obesity 11/29/2015   OSA on CPAP 11/29/2015   RLS (restless legs  syndrome) 11/29/2015   Papilledema 06/16/2015   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   Hyperlipidemia associated with type 2 diabetes mellitus (HCC) 01/21/2014   Sinus tachycardia 12/04/2011   Hypertension associated with type 2 diabetes mellitus (HCC) 12/04/2011   Morbid obesity (HCC) 11/07/2011    Allergies:  Allergies  Allergen Reactions   Cats Claw (Uncaria Tomentosa) Itching, Shortness Of Breath and Swelling   Dust Mite Extract Itching and Shortness Of Breath   Grass Pollen(K-O-R-T-Swt Vern) Itching and Shortness Of Breath   Mixed Feathers Itching, Shortness Of Breath and Swelling   Uncaria Tomentosa (Cats Claw) Itching and Swelling   Gramineae Pollens Other (See Comments)   Lactose Other (See Comments)   Pollen Extract Other (See Comments)   Medications:  Current Outpatient Medications:    acetaZOLAMIDE (DIAMOX) 250 MG tablet, Take 500 mg by mouth 2 (two) times daily., Disp: , Rfl:    albuterol (PROVENTIL) (2.5 MG/3ML) 0.083% nebulizer solution, Take 3 mLs (2.5 mg total) by nebulization every 4 (four) hours as needed for wheezing or shortness of breath (coughing fits)., Disp: 75 mL, Rfl: 1   albuterol (VENTOLIN HFA) 108 (90 Base) MCG/ACT inhaler, Inhale 2 puffs into the lungs every 4 (four) hours as needed for wheezing or shortness of breath (coughing fits)., Disp: 18 g, Rfl: 1   atorvastatin (LIPITOR) 40 MG tablet, Take 1 tablet (40 mg total) by mouth daily., Disp: 90 tablet, Rfl: 3   azelastine (ASTELIN) 0.1 % nasal spray, Place 1 spray into both nostrils daily as needed for rhinitis. Use in each nostril as directed, Disp: , Rfl:    Benralizumab (FASENRA PEN) 30 MG/ML SOAJ, INJECT 1 PEN UNDER THE SKIN EVERY 8 WEEKS., Disp: 1 mL, Rfl: 6   chlorpheniramine-HYDROcodone (TUSSIONEX) 10-8 MG/5ML, Take 5 mLs by mouth at bedtime as needed for cough., Disp: 70 mL, Rfl: 0   cromolyn (OPTICROM) 4 % ophthalmic solution, Place 1 drop into both eyes 4 (four) times daily as needed  (itchy/watery eyes)., Disp: 10 mL, Rfl: 3   famotidine (PEPCID) 20 MG tablet, Take by mouth., Disp: , Rfl:    fenofibrate (TRICOR) 145 MG tablet, Take 1 tablet (145 mg total) by mouth daily., Disp: 30 tablet, Rfl: 5   fluticasone (FLONASE) 50 MCG/ACT nasal spray, Place 1 spray into both nostrils 2 (two) times daily as needed (nasal congestion)., Disp: 16 g, Rfl: 5   levocetirizine (XYZAL) 5 MG tablet, Take 1 tablet (5 mg total) by mouth every evening., Disp: 90 tablet, Rfl: 3   levonorgestrel (MIRENA, 52 MG,) 20 MCG/24HR IUD, Mirena 20 mcg/24 hours (6 yrs) 52 mg intrauterine device  Take 1 device by intrauterine route., Disp: , Rfl:    lisinopril-hydrochlorothiazide (ZESTORETIC) 20-25 MG tablet, Take 1 tablet by mouth daily., Disp: 90 tablet, Rfl: 1   montelukast (SINGULAIR) 10 MG tablet, Take 1 tablet (10 mg total) by mouth at bedtime., Disp: 90 tablet, Rfl: 3   NIFEdipine (ADALAT CC) 60 MG 24 hr tablet, Take 60 mg by mouth every morning. (Patient not taking: Reported on 02/01/2023), Disp: , Rfl:    omeprazole (PRILOSEC) 20 MG capsule, Take 1 capsule (  20 mg total) by mouth daily., Disp: 30 capsule, Rfl: 3   ondansetron (ZOFRAN) 4 MG tablet, Take 1 tablet (4 mg total) by mouth every 8 (eight) hours as needed for nausea or vomiting., Disp: 20 tablet, Rfl: 0   tirzepatide (MOUNJARO) 15 MG/0.5ML Pen, Inject 15 mg into the skin once a week., Disp: 6 mL, Rfl: 3   VRAYLAR 4.5 MG CAPS, Take 1 capsule (4.5 mg total) by mouth daily., Disp: 90 capsule, Rfl: 1  Observations/Objective: Patient is well-developed, well-nourished in no acute distress.  Resting comfortably  at home.  Head is normocephalic, atraumatic.  No labored breathing.  Speech is clear and coherent with logical content.  Patient is alert and oriented at baseline.    Assessment and Plan:  1. Acute bronchitis, unspecified organism  - guaiFENesin (MUCINEX) 600 MG 12 hr tablet; Take 1 tablet (600 mg total) by mouth 2 (two) times daily for  7 days.  Dispense: 14 tablet; Refill: 0 - promethazine-dextromethorphan (PROMETHAZINE-DM) 6.25-15 MG/5ML syrup; Take 5 mLs by mouth 4 (four) times daily as needed for cough.  Dispense: 118 mL; Refill: 0 - benzonatate (TESSALON) 100 MG capsule; Take 1 capsule (100 mg total) by mouth 3 (three) times daily as needed for cough.  Dispense: 30 capsule; Refill: 0 - amoxicillin-clavulanate (AUGMENTIN) 875-125 MG tablet; Take 1 tablet by mouth 2 (two) times daily for 7 days.  Dispense: 14 tablet; Refill: 0 - albuterol (PROVENTIL) (2.5 MG/3ML) 0.083% nebulizer solution; Take 3 mLs (2.5 mg total) by nebulization every 4 (four) hours as needed for wheezing or shortness of breath (coughing fits).  Dispense: 75 mL; Refill: 0  -Make follow up- might need to adjust asthma medications for fall and winter times.  - Take meds as prescribed - Rest voice - Use a cool mist humidifier especially during the winter months when heat dries out the air. - Use saline nose sprays frequently to help soothe nasal passages if they are drying out. - Stay hydrated by drinking plenty of fluids - Keep thermostat turn down low to prevent drying out which can cause a dry cough. d - For fever or aches or pains- take tylenol or ibuprofen as directed on bottle             * for fevers greater than 101 orally you may alternate ibuprofen and tylenol every 3 hours.  If you do not improve you will need a follow up visit in person.                Reviewed side effects, risks and benefits of medication.    Patient acknowledged agreement and understanding of the plan.   Past Medical, Surgical, Social History, Allergies, and Medications have been Reviewed.     Follow Up Instructions: I discussed the assessment and treatment plan with the patient. The patient was provided an opportunity to ask questions and all were answered. The patient agreed with the plan and demonstrated an understanding of the instructions.  A copy of instructions  were sent to the patient via MyChart unless otherwise noted below.     The patient was advised to call back or seek an in-person evaluation if the symptoms worsen or if the condition fails to improve as anticipated.    Freddy Finner, NP

## 2023-05-02 ENCOUNTER — Telehealth: Payer: Self-pay | Admitting: Family Medicine

## 2023-05-04 ENCOUNTER — Ambulatory Visit: Payer: Medicaid Other | Admitting: Family Medicine

## 2023-05-07 ENCOUNTER — Telehealth: Payer: Self-pay | Admitting: *Deleted

## 2023-05-07 ENCOUNTER — Encounter: Payer: Self-pay | Admitting: Family Medicine

## 2023-05-07 NOTE — Telephone Encounter (Signed)
Fax received from [facility] for incontinence supplies Needing documentation for need, if supplies are still needed need to make an appt or wait thill 05/22/23 visit

## 2023-05-21 ENCOUNTER — Telehealth: Payer: Self-pay | Admitting: Family Medicine

## 2023-05-22 ENCOUNTER — Other Ambulatory Visit (HOSPITAL_COMMUNITY)
Admission: RE | Admit: 2023-05-22 | Discharge: 2023-05-22 | Disposition: A | Discharge status: Home / Self Care | Payer: Medicaid Other | Source: Ambulatory Visit | Attending: Family Medicine | Admitting: Family Medicine

## 2023-05-22 ENCOUNTER — Ambulatory Visit: Payer: Medicaid Other | Admitting: Family Medicine

## 2023-05-22 ENCOUNTER — Ambulatory Visit (INDEPENDENT_AMBULATORY_CARE_PROVIDER_SITE_OTHER): Payer: Medicaid Other | Admitting: Family Medicine

## 2023-05-22 ENCOUNTER — Encounter: Payer: Self-pay | Admitting: Family Medicine

## 2023-05-22 VITALS — BP 133/85 | HR 81 | Temp 97.3°F | Ht 66.0 in | Wt 240.2 lb

## 2023-05-22 DIAGNOSIS — Z23 Encounter for immunization: Secondary | ICD-10-CM

## 2023-05-22 DIAGNOSIS — E119 Type 2 diabetes mellitus without complications: Secondary | ICD-10-CM

## 2023-05-22 DIAGNOSIS — N3000 Acute cystitis without hematuria: Secondary | ICD-10-CM

## 2023-05-22 DIAGNOSIS — E1159 Type 2 diabetes mellitus with other circulatory complications: Secondary | ICD-10-CM | POA: Diagnosis not present

## 2023-05-22 DIAGNOSIS — J4550 Severe persistent asthma, uncomplicated: Secondary | ICD-10-CM

## 2023-05-22 DIAGNOSIS — I152 Hypertension secondary to endocrine disorders: Secondary | ICD-10-CM | POA: Diagnosis not present

## 2023-05-22 DIAGNOSIS — N3941 Urge incontinence: Secondary | ICD-10-CM | POA: Diagnosis not present

## 2023-05-22 DIAGNOSIS — E785 Hyperlipidemia, unspecified: Secondary | ICD-10-CM

## 2023-05-22 DIAGNOSIS — E1169 Type 2 diabetes mellitus with other specified complication: Secondary | ICD-10-CM

## 2023-05-22 DIAGNOSIS — Z903 Acquired absence of stomach [part of]: Secondary | ICD-10-CM

## 2023-05-22 LAB — URINALYSIS, ROUTINE W REFLEX MICROSCOPIC
Bilirubin, UA: NEGATIVE
Glucose, UA: NEGATIVE
Nitrite, UA: NEGATIVE
RBC, UA: NEGATIVE
Specific Gravity, UA: >1.03 — ABNORMAL HIGH (ref 1.005–1.030)
Urobilinogen, Ur: 0.2 mg/dL (ref 0.2–1.0)
pH, UA: 5.5 (ref 5.0–7.5)

## 2023-05-22 LAB — MICROSCOPIC EXAMINATION

## 2023-05-22 LAB — BAYER DCA HB A1C WAIVED: HB A1C (BAYER DCA - WAIVED): 4.7 % — ABNORMAL LOW (ref 4.8–5.6)

## 2023-05-22 MED ORDER — AIRSUPRA 90-80 MCG/ACT IN AERO: 2.0000 | INHALATION_SPRAY | RESPIRATORY_TRACT | 3 refills | Status: DC | PRN

## 2023-05-22 NOTE — Addendum Note (Signed)
Addended by: Sonny Masters on: 05/22/2023 01:57 PM   Modules accepted: Orders

## 2023-05-22 NOTE — Addendum Note (Signed)
Addended by: Lorelee Cover C on: 05/22/2023 01:29 PM   Modules accepted: Orders

## 2023-05-22 NOTE — Progress Notes (Addendum)
Subjective:  Patient ID: Cassandra Tucker, female    DOB: [birth year], 32 y.o.   MRN: [id number]  Patient Care Team: Sonny Masters, FNP as PCP - General (Family Medicine)   Chief Complaint:  Medical Management of Chronic Issues (3 month follow up ) and Urinary Incontinence   HPI: Cassandra Tucker is a 32 y.o. female presenting on 05/22/2023 for Medical Management of Chronic Issues (3 month follow up ) and Urinary Incontinence   Discussed the use of AI scribe software for clinical note transcription with the patient, who gave verbal consent to proceed.  History of Present Illness   The patient presents with a chief complaint of urinary incontinence, specifically stress and urge incontinence. They report that upon feeling the urge to urinate, they experience leakage, particularly when standing up. This issue is also triggered by coughing or sneezing. The patient has been managing the condition with pads, which have been mostly effective. However, during a recent two-month period of illness, the patient experienced increased incontinence. The patient was diagnosed with walking pneumonia during this time and was treated with a Z-Pak.  The patient also reports experiencing nausea for the first two to two and a half days following their weekly Mounjaro injections. They have been managing this side effect with leftover anti-nausea medication from a previous illness, but they are nearing the end of their supply. States her diabetes is well controlled.   The patient has also been experiencing breathing issues, particularly at night. They describe a cycle of coughing leading to difficulty breathing. They have an upcoming appointment with a allergist/asthma specialist to address this issue. The patient reports that their albuterol inhaler, which previously provided relief, is no longer effective.  The patient also mentions a concern about a perceived plateau in their weight loss progress, despite  being on the maximum dose of Mounjaro. They report limiting their intake of bread due to the medication and drinking about half a gallon of water daily.  She reports her blood pressure is doing well. Denies chest pain, leg swelling, weakness, confusion, or headaches. She is taking her cholesterol medications. No reported myalgias.       Relevant past medical, surgical, family, and social history reviewed and updated as indicated.  Allergies and medications reviewed and updated. Data reviewed: Chart in Epic.   Past Medical History:  Diagnosis Date   Ankle fracture, left 2000   Anxiety    Arthritis    left wrist and left ankle   Asthma    Back pain    Back pain    Bronchitis    Depression    Diabetes (HCC) 01/11/2016   type II  history of   Family history of adverse reaction to anesthesia    gmother had problems with N/V    GERD (gastroesophageal reflux disease)    Headache    High cholesterol    History of kidney stones    HLD (hyperlipidemia)    HSV-2 infection    Hypertension 2013   Intracranial hypertension    psuedotumor cebrum   Joint pain    Joint pain    Kienbock's disease    Leg edema    OSA (obstructive sleep apnea)    cpap - does not know settings    Papilledema    Prediabetes    Pregnancy induced hypertension    Pseudotumor cerebri syndrome    RLS (restless legs syndrome)    Sinus tachycardia    SOB (shortness of  breath)     Past Surgical History:  Procedure Laterality Date   CESAREAN SECTION N/A 07/17/2019   Procedure: CESAREAN SECTION;  Surgeon: Osborn Coho, MD;  Location: [facility];  Service: Obstetrics;  Laterality: N/A;   FRACTURE SURGERY  2005   ankle   FRACTURE SURGERY  2013   wrist (deteriorating bone)   LAPAROSCOPIC GASTRIC SLEEVE RESECTION N/A 05/08/2017   Procedure: LAPAROSCOPIC GASTRIC SLEEVE RESECTION;  Surgeon: Berna Bue, MD;  Location: [facility] OR[facility];  Service: General;  Laterality: N/A;   LAPAROSCOPIC GASTRIC SLEEVE RESECTION   05/08/2017   UPPER GI ENDOSCOPY  05/08/2017   Procedure: UPPER GI ENDOSCOPY;  Surgeon: Berna Bue, MD;  Location: [facility] ORS;  Service: General;;   urethra stretch     WRIST SURGERY  2015    Social History   Socioeconomic History   Marital status: Married    Spouse name: Neil Crouch   Number of children: 0   Years of education: 12+   Highest education level: Some college, no degree  Occupational History   Occupation: Engineer, drilling: [employer]  Tobacco Use   Smoking status: Every Day    Current packs/day: 0.00    Types: Cigarettes    Last attempt to quit: 10/05/2018    Years since quitting: 4.6   Smokeless tobacco: Never  Vaping Use   Vaping status: Never Used  Substance and Sexual Activity   Alcohol use: Not Currently   Drug use: Not Currently    Types: Marijuana   Sexual activity: Yes    Birth control/protection: I.U.D.    Comment: condoms previously  Other Topics Concern   Not on file  Social History Narrative   Lives with grandparents   Caffeine use: Drinks coffee/tea/soda- 20oz per day (none after starting diamox)   Social Determinants of Health   Financial Resource Strain: Medium Risk (02/01/2023)   Overall Financial Resource Strain (CARDIA)    Difficulty of Paying Living Expenses: Somewhat hard  Food Insecurity: No Food Insecurity (02/01/2023)   Hunger Vital Sign    Worried About Running Out of Food in the Last Year: Never true    Ran Out of Food in the Last Year: Never true  Transportation Needs: No Transportation Needs (02/01/2023)   PRAPARE - Administrator, Civil Service (Medical): No    Lack of Transportation (Non-Medical): No  Physical Activity: Unknown (02/01/2023)   Exercise Vital Sign    Days of Exercise per Week: 0 days    Minutes of Exercise per Session: Not on file  Stress: Stress Concern Present (02/01/2023)   [employer] of Occupational Health - Occupational Stress Questionnaire    Feeling of Stress : Very much  Social  Connections: Unknown (02/19/2023)   Received from [facility]   Social Network    Social Network: Not on file  Intimate Partner Violence: Unknown (02/19/2023)   Received from [facility]   HITS    Physically Hurt: Not on file    Insult or Talk Down To: Not on file    Threaten Physical Harm: Not on file    Scream or Curse: Not on file    Outpatient Encounter Medications as of 05/22/2023  Medication Sig   acetaZOLAMIDE (DIAMOX) 250 MG tablet Take 500 mg by mouth 2 (two) times daily.   albuterol (PROVENTIL) (2.5 MG/3ML) 0.083% nebulizer solution Take 3 mLs (2.5 mg total) by nebulization every 4 (four) hours as needed for wheezing or shortness of breath (coughing fits).  Albuterol-Budesonide (AIRSUPRA) 90-80 MCG/ACT AERO Inhale 2 puffs into the lungs every 4 (four) hours as needed.   atorvastatin (LIPITOR) 40 MG tablet Take 1 tablet (40 mg total) by mouth daily.   azelastine (ASTELIN) 0.1 % nasal spray Place 1 spray into both nostrils daily as needed for rhinitis. Use in each nostril as directed   Benralizumab (FASENRA PEN) 30 MG/ML SOAJ INJECT 1 PEN UNDER THE SKIN EVERY 8 WEEKS.   benzonatate (TESSALON) 100 MG capsule Take 1 capsule (100 mg total) by mouth 3 (three) times daily as needed for cough.   cromolyn (OPTICROM) 4 % ophthalmic solution Place 1 drop into both eyes 4 (four) times daily as needed (itchy/watery eyes).   famotidine (PEPCID) 20 MG tablet Take by mouth.   fenofibrate (TRICOR) 145 MG tablet Take 1 tablet (145 mg total) by mouth daily.   fluticasone (FLONASE) 50 MCG/ACT nasal spray Place 1 spray into both nostrils 2 (two) times daily as needed (nasal congestion).   levocetirizine (XYZAL) 5 MG tablet Take 1 tablet (5 mg total) by mouth every evening.   levonorgestrel (MIRENA, 52 MG,) 20 MCG/24HR IUD Mirena 20 mcg/24 hours (6 yrs) 52 mg intrauterine device  Take 1 device by intrauterine route.   lisinopril-hydrochlorothiazide (ZESTORETIC) 20-25 MG tablet Take 1 tablet by  mouth daily.   montelukast (SINGULAIR) 10 MG tablet Take 1 tablet (10 mg total) by mouth at bedtime.   omeprazole (PRILOSEC) 20 MG capsule Take 1 capsule (20 mg total) by mouth daily.   tirzepatide (MOUNJARO) 15 MG/0.5ML Pen Inject 15 mg into the skin once a week.   VRAYLAR 4.5 MG CAPS Take 1 capsule (4.5 mg total) by mouth daily.   NIFEdipine (ADALAT CC) 60 MG 24 hr tablet Take 60 mg by mouth every morning. (Patient not taking: Reported on 05/22/2023)   [DISCONTINUED] cetirizine (ZYRTEC) 10 MG tablet Take 10 mg by mouth daily.   [DISCONTINUED] ondansetron (ZOFRAN) 4 MG tablet Take 1 tablet (4 mg total) by mouth every 8 (eight) hours as needed for nausea or vomiting.   [DISCONTINUED] promethazine-dextromethorphan (PROMETHAZINE-DM) 6.25-15 MG/5ML syrup Take 5 mLs by mouth 4 (four) times daily as needed for cough.   No facility-administered encounter medications on file as of 05/22/2023.    Allergies  Allergen Reactions   Cats Claw (Uncaria Tomentosa) Itching, Shortness Of Breath and Swelling   Dust Mite Extract Itching and Shortness Of Breath   Grass Pollen(K-O-R-T-Swt Vern) Itching and Shortness Of Breath   Mixed Feathers Itching, Shortness Of Breath and Swelling   Uncaria Tomentosa (Cats Claw) Itching and Swelling   Gramineae Pollens Other (See Comments)   Lactose Other (See Comments)   Pollen Extract Other (See Comments)    Pertinent ROS per HPI, otherwise unremarkable      Objective:  BP 133/85   Pulse 81   Temp (!) 97.3 F (36.3 C) (Temporal)   Ht 5\' 6"  (1.676 m)   Wt 240 lb 3.2 oz (109 kg)   SpO2 97%   BMI 38.77 kg/m    Wt Readings from Last 3 Encounters:  05/22/23 240 lb 3.2 oz (109 kg)  02/01/23 264 lb 12.8 oz (120.1 kg)  01/16/23 262 lb 8 oz (119.1 kg)    Physical Exam Vitals and nursing note reviewed.  Constitutional:      General: She is not in acute distress.    Appearance: Normal appearance. She is well-developed and well-groomed. She is morbidly obese.  She is not ill-appearing, toxic-appearing or diaphoretic.  HENT:  Head: Normocephalic and atraumatic.     Jaw: There is normal jaw occlusion.     Right Ear: Hearing normal.     Left Ear: Hearing normal.     Nose: Nose normal.     Mouth/Throat:     Lips: Pink.     Mouth: Mucous membranes are moist.     Pharynx: Oropharynx is clear. Uvula midline.  Eyes:     General: Lids are normal.     Extraocular Movements: Extraocular movements intact.     Conjunctiva/sclera: Conjunctivae normal.     Pupils: Pupils are equal, round, and reactive to light.  Neck:     Thyroid: No thyroid mass, thyromegaly or thyroid tenderness.     Vascular: No carotid bruit or JVD.     Trachea: Trachea and phonation normal.  Cardiovascular:     Rate and Rhythm: Normal rate and regular rhythm.     Chest Wall: PMI is not displaced.     Pulses: Normal pulses.     Heart sounds: Normal heart sounds. No murmur heard.    No friction rub. No gallop.  Pulmonary:     Effort: Pulmonary effort is normal. No respiratory distress.     Breath sounds: Normal breath sounds. No wheezing.  Abdominal:     General: Bowel sounds are normal.     Palpations: Abdomen is soft.  Musculoskeletal:        General: Normal range of motion.     Cervical back: Normal range of motion and neck supple.     Right lower leg: No edema.     Left lower leg: No edema.  Lymphadenopathy:     Cervical: No cervical adenopathy.  Skin:    General: Skin is warm and dry.     Capillary Refill: Capillary refill takes less than 2 seconds.     Coloration: Skin is not cyanotic, jaundiced or pale.     Findings: No rash.  Neurological:     General: No focal deficit present.     Mental Status: She is alert and oriented to person, place, and time.     Sensory: Sensation is intact.     Motor: Motor function is intact.     Coordination: Coordination is intact.     Gait: Gait is intact.     Deep Tendon Reflexes: Reflexes are normal and symmetric.   Psychiatric:        Attention and Perception: Attention and perception normal.        Mood and Affect: Mood and affect normal.        Speech: Speech normal.        Behavior: Behavior normal. Behavior is cooperative.        Thought Content: Thought content normal.        Cognition and Memory: Cognition and memory normal.        Judgment: Judgment normal.      Results for orders placed or performed in visit on 05/22/23  Microscopic Examination   Urine  Result Value Ref Range   WBC, UA 6-10 (A) 0 - 5 /hpf   RBC, Urine 0-2 0 - 2 /hpf   Epithelial Cells (non renal) 0-10 0 - 10 /hpf   Bacteria, UA Many (A) None seen/Few  Bayer DCA Hb A1c Waived  Result Value Ref Range   HB A1C (BAYER DCA - WAIVED) 4.7 (L) 4.8 - 5.6 %  Urinalysis, Routine w reflex microscopic  Result Value Ref Range   Specific Gravity, UA >1.030 (  H) 1.005 - 1.030   pH, UA 5.5 5.0 - 7.5   Color, UA Orange Yellow   Appearance Ur Cloudy (A) Clear   Leukocytes,UA 1+ (A) Negative   Protein,UA Trace (A) Negative/Trace   Glucose, UA Negative Negative   Ketones, UA Trace (A) Negative   RBC, UA Negative Negative   Bilirubin, UA Negative Negative   Urobilinogen, Ur 0.2 0.2 - 1.0 mg/dL   Nitrite, UA Negative Negative   Microscopic Examination See below:        Pertinent labs & imaging results that were available during my care of the patient were reviewed by me and considered in my medical decision making.  Assessment & Plan:  Everlena was seen today for medical management of chronic issues and urinary incontinence.  Diagnoses and all orders for this visit:  Controlled type 2 diabetes mellitus without complication, without long-term current use of insulin (HCC) -     CMP14+EGFR -     CBC with Differential/Platelet -     Thyroid Panel With TSH -     Lipid panel -     Bayer DCA Hb A1c Waived  Hyperlipidemia associated with type 2 diabetes mellitus (HCC) -     CMP14+EGFR -     Lipid panel -     Bayer DCA Hb A1c  Waived  Hypertension associated with type 2 diabetes mellitus (HCC) -     CMP14+EGFR -     CBC with Differential/Platelet -     Thyroid Panel With TSH -     Lipid panel -     Bayer DCA Hb A1c Waived  Urge incontinence of urine -     Urinalysis, Routine w reflex microscopic -     Microscopic Examination -     Urine Culture -     Urine cytology ancillary only  History of sleeve gastrectomy -     VITAMIN D 25 Hydroxy (Vit-D Deficiency, Fractures) -     Vitamin B12  Severe persistent asthma without complication -     Albuterol-Budesonide (AIRSUPRA) 90-80 MCG/ACT AERO; Inhale 2 puffs into the lungs every 4 (four) hours as needed.  Need for influenza vaccination -     Flu vaccine trivalent PF, 6mos and older(Flulaval,Afluria,Fluarix,Fluzone)     Assessment and Plan    Asthma Worsening nocturnal asthma symptoms with frequent coughing and dyspnea. Current regimen includes Singulair, Flonase, and albuterol inhaler, which is ineffective. Missed an injection once, leading to severe symptoms. Discussed Airsupra (albuterol plus budesonide) as an alternative rescue inhaler. - Prescribe Airsupra and verify insurance coverage. - Instruct on Airsupra use: two puffs every 20 minutes up to three doses for acute exacerbation, and two puffs every four hours as needed. - Ensure rinsing mouth after using Airsupra. - Update labs today. - Follow up with pulmonologist on February 11.  Urge and Stress Incontinence Severe nocturnal urge and stress incontinence, increased pad use during recent illness. No prior medications recalled. Discussed potential benefits of medication for incontinence. - Submit form for incontinence supplies to insurance. - Check urine for infection. - Review chart for previous incontinence medications.  Nausea secondary to [facility] Severe nausea for 2-2.5 days post-weekly Mounjaro injection. Uses leftover anti-nausea medication, which is running out. Discussed vitamin B  complex (B6 and vitamin B) as an additional measure. - Recommend vitamin B complex for nausea.  Weight Plateau Weight stable at 240-243 lbs despite maximum dose of Mounjaro (15 mg). Discussed dietary changes, specifically limiting gluten and dairy, to aid weight  loss. - Advise limiting gluten and dairy intake for two weeks to assess impact on weight loss.  General Health Maintenance Leg swelling and anxiety-related chest tightness. No significant changes reported. Discussed monitoring symptoms and continuing current medications and lifestyle modifications. - Monitor leg swelling and chest tightness. - Continue current medications and lifestyle modifications. - Influenza vaccination administered today.   Follow-up - Schedule follow-up appointment in three months. - Discuss Airsupra prescription with Dr. Selena Batten during next pulmonology visit.        Continue all other maintenance medications.  Follow up plan: Return in about 3 months (around 08/20/2023) for chronic follow up, all labs.   Continue healthy lifestyle choices, including diet (rich in fruits, vegetables, and lean proteins, and low in salt and simple carbohydrates) and exercise (at least 30 minutes of moderate physical activity daily).  Educational handout given for DM  The above assessment and management plan was discussed with the patient. The patient verbalized understanding of and has agreed to the management plan. Patient is aware to call the clinic if they develop any new symptoms or if symptoms persist or worsen. Patient is aware when to return to the clinic for a follow-up visit. Patient educated on when it is appropriate to go to the emergency department.   Kari Baars, FNP-C [facility] [phone]

## 2023-05-22 NOTE — Patient Instructions (Signed)

## 2023-05-23 LAB — LIPID PANEL
Chol/HDL Ratio: 5.3 ratio — ABNORMAL HIGH (ref 0.0–4.4)
Cholesterol, Total: 187 mg/dL (ref 100–199)
HDL: 35 mg/dL — ABNORMAL LOW (ref >39)
LDL Chol Calc (NIH): 135 mg/dL — ABNORMAL HIGH (ref 0–99)
Triglycerides: 92 mg/dL (ref 0–149)
VLDL Cholesterol Cal: 17 mg/dL (ref 5–40)

## 2023-05-23 LAB — CBC WITH DIFFERENTIAL/PLATELET
Basophils Absolute: 0 10*3/uL (ref 0.0–0.2)
Basos: 0 %
EOS (ABSOLUTE): 0 10*3/uL (ref 0.0–0.4)
Eos: 0 %
Hematocrit: 44.5 % (ref 34.0–46.6)
Hemoglobin: 14.4 g/dL (ref 11.1–15.9)
Immature Grans (Abs): 0 10*3/uL (ref 0.0–0.1)
Immature Granulocytes: 0 %
Lymphocytes Absolute: 2.1 10*3/uL (ref 0.7–3.1)
Lymphs: 31 %
MCH: 27.9 pg (ref 26.6–33.0)
MCHC: 32.4 g/dL (ref 31.5–35.7)
MCV: 86 fL (ref 79–97)
Monocytes Absolute: 0.4 10*3/uL (ref 0.1–0.9)
Monocytes: 6 %
Neutrophils Absolute: 4.2 10*3/uL (ref 1.4–7.0)
Neutrophils: 63 %
Platelets: 246 10*3/uL (ref 150–450)
RBC: 5.16 x10E6/uL (ref 3.77–5.28)
RDW: 12.7 % (ref 11.7–15.4)
WBC: 6.8 10*3/uL (ref 3.4–10.8)

## 2023-05-23 LAB — CMP14+EGFR
ALT: 8 IU/L (ref 0–32)
AST: 8 IU/L (ref 0–40)
Albumin: 4.1 g/dL (ref 3.9–4.9)
Alkaline Phosphatase: 65 IU/L (ref 44–121)
BUN/Creatinine Ratio: 13 (ref 9–23)
BUN: 11 mg/dL (ref 6–20)
Bilirubin Total: 0.9 mg/dL (ref 0.0–1.2)
CO2: 20 mmol/L (ref 20–29)
Calcium: 9.2 mg/dL (ref 8.7–10.2)
Chloride: 107 mmol/L — ABNORMAL HIGH (ref 96–106)
Creatinine, Ser: 0.84 mg/dL (ref 0.57–1.00)
Globulin, Total: 2.3 g/dL (ref 1.5–4.5)
Glucose: 83 mg/dL (ref 70–99)
Potassium: 4.3 mmol/L (ref 3.5–5.2)
Sodium: 140 mmol/L (ref 134–144)
Total Protein: 6.4 g/dL (ref 6.0–8.5)
eGFR: 95 mL/min/1.73 (ref >59)

## 2023-05-23 LAB — THYROID PANEL WITH TSH
Free Thyroxine Index: 2.3 (ref 1.2–4.9)
T3 Uptake Ratio: 26 % (ref 24–39)
T4, Total: 8.8 ug/dL (ref 4.5–12.0)
TSH: 1.26 u[IU]/mL (ref 0.450–4.500)

## 2023-05-23 LAB — VITAMIN B12: Vitamin B-12: 362 pg/mL (ref 232–1245)

## 2023-05-23 LAB — VITAMIN D 25 HYDROXY (VIT D DEFICIENCY, FRACTURES): Vit D, 25-Hydroxy: 25.1 ng/mL — ABNORMAL LOW (ref 30.0–100.0)

## 2023-05-24 ENCOUNTER — Encounter: Payer: Self-pay | Admitting: Family Medicine

## 2023-05-24 LAB — URINE CYTOLOGY ANCILLARY ONLY
Chlamydia: NEGATIVE
Comment: NEGATIVE
Comment: NEGATIVE
Comment: NORMAL
Neisseria Gonorrhea: NEGATIVE
Trichomonas: NEGATIVE

## 2023-05-24 LAB — URINE CULTURE

## 2023-05-24 MED ORDER — SULFAMETHOXAZOLE-TRIMETHOPRIM 800-160 MG PO TABS: 1.0000 | ORAL_TABLET | Freq: Two times a day (BID) | ORAL | 0 refills | Status: AC

## 2023-05-24 NOTE — Addendum Note (Signed)
Addended by: Sonny Masters on: 05/24/2023 04:44 PM   Modules accepted: Orders

## 2023-05-31 ENCOUNTER — Ambulatory Visit: Payer: Medicaid Other | Admitting: Allergy

## 2023-05-31 ENCOUNTER — Encounter: Payer: Self-pay | Admitting: Allergy

## 2023-05-31 VITALS — BP 120/80 | HR 80 | Temp 97.9°F | Resp 16

## 2023-05-31 DIAGNOSIS — H1013 Acute atopic conjunctivitis, bilateral: Secondary | ICD-10-CM | POA: Diagnosis not present

## 2023-05-31 DIAGNOSIS — J4550 Severe persistent asthma, uncomplicated: Secondary | ICD-10-CM | POA: Diagnosis not present

## 2023-05-31 DIAGNOSIS — J3089 Other allergic rhinitis: Secondary | ICD-10-CM

## 2023-05-31 DIAGNOSIS — J301 Allergic rhinitis due to pollen: Secondary | ICD-10-CM

## 2023-05-31 DIAGNOSIS — J45998 Other asthma: Secondary | ICD-10-CM | POA: Diagnosis not present

## 2023-05-31 DIAGNOSIS — J3081 Allergic rhinitis due to animal (cat) (dog) hair and dander: Secondary | ICD-10-CM | POA: Diagnosis not present

## 2023-05-31 MED ORDER — BREZTRI AEROSPHERE 160-9-4.8 MCG/ACT IN AERO: 2.0000 | INHALATION_SPRAY | Freq: Two times a day (BID) | RESPIRATORY_TRACT | 2 refills | Status: AC

## 2023-05-31 MED ORDER — ALBUTEROL SULFATE (2.5 MG/3ML) 0.083% IN NEBU: 2.5000 mg | INHALATION_SOLUTION | RESPIRATORY_TRACT | 1 refills | Status: AC | PRN

## 2023-05-31 MED ORDER — METHYLPREDNISOLONE 4 MG PO TBPK: ORAL_TABLET | ORAL | 0 refills | Status: DC

## 2023-05-31 NOTE — Progress Notes (Signed)
Follow Up Note  RE: Cassandra Tucker MRN: [id number] DOB: 02-24-1991 Date of Office Visit: 05/31/2023  Referring provider: Sonny Masters, FNP Primary care provider: Sonny Masters, FNP  Chief Complaint: Asthma  History of Present Illness: I had the pleasure of seeing Cassandra Tucker for a follow up visit at the [facility] on 05/31/2023. She is a 32 y.o. female, who is being followed for asthma on Fasenra, allergic rhinoconjunctivitis. Her previous allergy office visit was on 01/16/2023 with Cassandra Tucker. Today is a regular follow up visit.  Discussed the use of AI scribe software for clinical note transcription with the patient, who gave verbal consent to proceed.  The patient, with a history of asthma, presented with a recent episode of pneumonia approximately two and a half months ago, which was treated with a Z-Pak. Post-treatment, the patient reported resolution of pneumonia symptoms. However, she has been experiencing nocturnal coughing spells and dyspnea, particularly after physical activity such as using the bathroom. The patient has been using a nebulizer machine three to four times a week, which provides more relief than the inhaler. She has not recently taken systemic steroids.   The patient has been regularly administering Fasenra injections at home every eight weeks and taking Symbicort 2 puffs once daily. The patient also mentioned a recent bacterial infection, possibly a UTI, which was treated with an antibiotic.  The patient's allergies are currently managed with Xyzal, and she has discontinued the use of montelukast. She continues to use a nasal spray for allergies.     Assessment and Plan: Cassandra Tucker is a 32 y.o. female with: Not well controlled severe persistent asthma Past history - Symptoms of chest tightness, shortness of breath, coughing, wheezing, nocturnal awakenings for 1 year. History of reflux but no longer on PPI. Ex-smoker. 2021 spirometry  showed: normal pattern and 13% improvement in FEV1 post bronchodilator treatment. Clinically feeling better.  Interim history - Recent pneumonia with subsequent increase in nocturnal symptoms and nebulizer use. Currently on Symbicort 160/4.5, Ventolin, and Fasenra injections.  Today's spirometry was normal - slight decline from previous. Continue Fasenra injections at home every 8 weeks.  Nebulizer machine given.  Take medrol pak as instructed.  Daily controller medication(s): Continue Singulair (montelukast) 10mg  daily at night. Start start Breztri 2 puffs twice a day with spacer and rinse mouth afterwards. Samples given.  May use albuterol rescue inhaler 2 puffs or nebulizer every 4 to 6 hours as needed for shortness of breath, chest tightness, coughing, and wheezing.  Monitor frequency of use - if you need to use it more than twice per week on a consistent basis let us know.  Get spirometry at next visit. If no improvement consider switching biologic agent.  Seasonal allergic rhinitis due to pollen Allergic rhinitis due to dust mite Allergic rhinitis due to animal dander Allergic conjunctivitis of both eyes Past history - 2021 skin testing showed: Positive to grass, dust mites, cats, feathers. Negative to common foods.  Interim history - stable.  Continue environmental control measures. May use over the counter antihistamines such as Zyrtec (cetirizine), Claritin (loratadine), Allegra (fexofenadine), or Xyzal (levocetirizine) daily. May take it twice a day if needed.  May use Flonase (fluticasone) nasal spray 1 spray per nostril twice a day as needed for nasal congestion.     Return in about 2 months (around 08/01/2023).  Meds ordered this encounter  Medications   Budeson-Glycopyrrol-Formoterol (BREZTRI AEROSPHERE) 160-9-4.8 MCG/ACT AERO    Sig: Inhale  2 puffs into the lungs in the morning and at bedtime. with spacer and rinse mouth afterwards.    Dispense:  10.7 g    Refill:  2    albuterol (PROVENTIL) (2.5 MG/3ML) 0.083% nebulizer solution    Sig: Take 3 mLs (2.5 mg total) by nebulization every 4 (four) hours as needed for wheezing or shortness of breath (coughing fits).    Dispense:  75 mL    Refill:  1   methylPREDNISolone (MEDROL DOSEPAK) 4 MG TBPK tablet    Sig: Take 6 tablets on day 1, 5 tablets on day 2, 4 tabs on day 3, 3 tabs on day 4, 2 tabs on day 5, 1 tab on day 6.    Dispense:  21 tablet    Refill:  0   Lab Orders  No laboratory test(s) ordered today    Diagnostics: Spirometry:  Tracings reviewed. Her effort: Good reproducible efforts. FVC: 3.78L FEV1: 2.84L, 84% predicted FEV1/FVC ratio: 75% Interpretation: Spirometry consistent with normal pattern.  Please see scanned spirometry results for details.  Medication List:  Current Outpatient Medications  Medication Sig Dispense Refill   acetaZOLAMIDE (DIAMOX) 250 MG tablet Take 500 mg by mouth 2 (two) times daily.     albuterol (PROVENTIL) (2.5 MG/3ML) 0.083% nebulizer solution Take 3 mLs (2.5 mg total) by nebulization every 4 (four) hours as needed for wheezing or shortness of breath (coughing fits). 75 mL 1   atorvastatin (LIPITOR) 40 MG tablet Take 1 tablet (40 mg total) by mouth daily. 90 tablet 3   azelastine (ASTELIN) 0.1 % nasal spray Place 1 spray into both nostrils daily as needed for rhinitis. Use in each nostril as directed     Benralizumab (FASENRA PEN) 30 MG/ML SOAJ INJECT 1 PEN UNDER THE SKIN EVERY 8 WEEKS. 1 mL 6   Budeson-Glycopyrrol-Formoterol (BREZTRI AEROSPHERE) 160-9-4.8 MCG/ACT AERO Inhale 2 puffs into the lungs in the morning and at bedtime. with spacer and rinse mouth afterwards. 10.7 g 2   cromolyn (OPTICROM) 4 % ophthalmic solution Place 1 drop into both eyes 4 (four) times daily as needed (itchy/watery eyes). 10 mL 3   famotidine (PEPCID) 20 MG tablet Take by mouth.     fenofibrate (TRICOR) 145 MG tablet Take 1 tablet (145 mg total) by mouth daily. 30 tablet 5   fluticasone  (FLONASE) 50 MCG/ACT nasal spray Place 1 spray into both nostrils 2 (two) times daily as needed (nasal congestion). 16 g 5   levocetirizine (XYZAL) 5 MG tablet Take 1 tablet (5 mg total) by mouth every evening. 90 tablet 3   levonorgestrel (MIRENA, 52 MG,) 20 MCG/24HR IUD Mirena 20 mcg/24 hours (6 yrs) 52 mg intrauterine device  Take 1 device by intrauterine route.     lisinopril-hydrochlorothiazide (ZESTORETIC) 20-25 MG tablet Take 1 tablet by mouth daily. 90 tablet 1   methylPREDNISolone (MEDROL DOSEPAK) 4 MG TBPK tablet Take 6 tablets on day 1, 5 tablets on day 2, 4 tabs on day 3, 3 tabs on day 4, 2 tabs on day 5, 1 tab on day 6. 21 tablet 0   omeprazole (PRILOSEC) 20 MG capsule Take 1 capsule (20 mg total) by mouth daily. 30 capsule 3   sulfamethoxazole-trimethoprim (BACTRIM DS) 800-160 MG tablet Take 1 tablet by mouth 2 (two) times daily for 7 days. 14 tablet 0   tirzepatide (MOUNJARO) 15 MG/0.5ML Pen Inject 15 mg into the skin once a week. 6 mL 3   VRAYLAR 4.5 MG CAPS Take 1 capsule (  4.5 mg total) by mouth daily. 90 capsule 1   NIFEdipine (ADALAT CC) 60 MG 24 hr tablet Take 60 mg by mouth every morning. (Patient not taking: No sig reported)     No current facility-administered medications for this visit.   Allergies: Allergies  Allergen Reactions   Cats Claw (Uncaria Tomentosa) Itching, Shortness Of Breath and Swelling   Dust Mite Extract Itching and Shortness Of Breath   Grass Pollen(K-O-R-T-Swt Vern) Itching and Shortness Of Breath   Mixed Feathers Itching, Shortness Of Breath and Swelling   Uncaria Tomentosa (Cats Claw) Itching and Swelling   Gramineae Pollens Other (See Comments)   Lactose Other (See Comments)   Pollen Extract Other (See Comments)   I reviewed her past medical history, social history, family history, and environmental history and no significant changes have been reported from her previous visit.  Review of Systems  Constitutional:  Negative for appetite change,  chills, fever and unexpected weight change.  HENT:  Negative for congestion, postnasal drip, rhinorrhea and sneezing.   Eyes:  Negative for itching.  Respiratory:  Positive for shortness of breath. Negative for cough, chest tightness and wheezing.   Cardiovascular:  Negative for chest pain.  Gastrointestinal:  Negative for abdominal pain.  Genitourinary:  Negative for difficulty urinating.  Skin:  Negative for rash.  Allergic/Immunologic: Positive for environmental allergies. Negative for food allergies.  Neurological:  Negative for headaches.    Objective: BP 120/80   Pulse 80   Temp 97.9 F (36.6 C) (Temporal)   Resp 16  There is no height or weight on file to calculate BMI. Physical Exam Vitals and nursing note reviewed.  Constitutional:      Appearance: Normal appearance. She is well-developed. She is obese.  HENT:     Head: Normocephalic and atraumatic.     Right Ear: Tympanic membrane and external ear normal.     Left Ear: Tympanic membrane and external ear normal.     Nose: Nose normal.     Mouth/Throat:     Mouth: Mucous membranes are moist.     Pharynx: Oropharynx is clear.  Eyes:     Conjunctiva/sclera: Conjunctivae normal.  Cardiovascular:     Rate and Rhythm: Normal rate and regular rhythm.     Heart sounds: Normal heart sounds. No murmur heard.    No friction rub. No gallop.  Pulmonary:     Effort: Pulmonary effort is normal.     Breath sounds: No wheezing, rhonchi or rales.  Musculoskeletal:     Cervical back: Neck supple.  Skin:    General: Skin is warm.     Findings: No rash.  Neurological:     Mental Status: She is alert and oriented to person, place, and time.  Psychiatric:        Behavior: Behavior normal.    Previous notes and tests were reviewed. The plan was reviewed with the patient/family, and all questions/concerned were addressed.  It was my pleasure to see Cassandra Tucker today and participate in her care. Please feel free to contact me with  any questions or concerns.  Sincerely,  Wyline Mood, DO Allergy & Immunology  [facility] office: [phone] [facility] office: [phone]

## 2023-05-31 NOTE — Patient Instructions (Addendum)
Asthma: Normal breathing test today. Continue Fasenra injections at home every 8 weeks.  Nebulizer machine given.  Take medrol pak as instructed.  Daily controller medication(s): Continue Singulair (montelukast) 10mg  daily at night. Start start Breztri 2 puffs twice a day with spacer and rinse mouth afterwards. Samples given.  May use albuterol rescue inhaler 2 puffs or nebulizer every 4 to 6 hours as needed for shortness of breath, chest tightness, coughing, and wheezing.  Monitor frequency of use - if you need to use it more than twice per week on a consistent basis let us know.  Asthma control goals:  Full participation in all desired activities (may need albuterol before activity) Albuterol use two times or less a week on average (not counting use with activity) Cough interfering with sleep two times or less a month Oral steroids no more than once a year No hospitalizations   Environmental allergies Past skin testing positive to grass, dust mites, cats, feathers. Continue environmental control measures. May use over the counter antihistamines such as Zyrtec (cetirizine), Claritin (loratadine), Allegra (fexofenadine), or Xyzal (levocetirizine) daily. May take it twice a day if needed.  May use Flonase (fluticasone) nasal spray 1 spray per nostril twice a day as needed for nasal congestion.   Follow up in 2 months or sooner if needed to check for asthma.

## 2023-06-04 ENCOUNTER — Encounter: Payer: Self-pay | Admitting: Family Medicine

## 2023-06-05 DIAGNOSIS — Z1331 Encounter for screening for depression: Secondary | ICD-10-CM | POA: Diagnosis not present

## 2023-06-05 DIAGNOSIS — M5431 Sciatica, right side: Secondary | ICD-10-CM | POA: Diagnosis not present

## 2023-06-05 DIAGNOSIS — N760 Acute vaginitis: Secondary | ICD-10-CM | POA: Diagnosis not present

## 2023-06-08 DIAGNOSIS — R32 Unspecified urinary incontinence: Secondary | ICD-10-CM | POA: Diagnosis not present

## 2023-06-11 ENCOUNTER — Encounter: Payer: Self-pay | Admitting: Family Medicine

## 2023-06-14 ENCOUNTER — Ambulatory Visit: Payer: Medicaid Other | Admitting: Family Medicine

## 2023-06-14 ENCOUNTER — Other Ambulatory Visit: Payer: Self-pay | Admitting: *Deleted

## 2023-06-14 ENCOUNTER — Encounter: Payer: Self-pay | Admitting: Allergy

## 2023-06-14 MED ORDER — ALBUTEROL SULFATE HFA 108 (90 BASE) MCG/ACT IN AERS: 2.0000 | INHALATION_SPRAY | Freq: Four times a day (QID) | RESPIRATORY_TRACT | 1 refills | Status: DC | PRN

## 2023-06-14 MED ORDER — VENTOLIN HFA 108 (90 BASE) MCG/ACT IN AERS: 2.0000 | INHALATION_SPRAY | RESPIRATORY_TRACT | 1 refills | Status: AC | PRN

## 2023-07-23 ENCOUNTER — Encounter: Payer: Self-pay | Admitting: Family Medicine

## 2023-07-24 ENCOUNTER — Ambulatory Visit: Payer: Medicaid Other | Admitting: Allergy

## 2023-07-25 ENCOUNTER — Ambulatory Visit (INDEPENDENT_AMBULATORY_CARE_PROVIDER_SITE_OTHER): Payer: Medicaid Other

## 2023-07-25 ENCOUNTER — Ambulatory Visit: Payer: Medicaid Other | Admitting: Family Medicine

## 2023-07-25 ENCOUNTER — Encounter: Payer: Self-pay | Admitting: Family Medicine

## 2023-07-25 ENCOUNTER — Ambulatory Visit: Payer: Self-pay | Admitting: Family Medicine

## 2023-07-25 VITALS — BP 156/90 | HR 75 | Temp 97.3°F | Ht 66.0 in | Wt 245.0 lb

## 2023-07-25 DIAGNOSIS — M545 Low back pain, unspecified: Secondary | ICD-10-CM

## 2023-07-25 DIAGNOSIS — M48061 Spinal stenosis, lumbar region without neurogenic claudication: Secondary | ICD-10-CM | POA: Diagnosis not present

## 2023-07-25 DIAGNOSIS — M5136 Other intervertebral disc degeneration, lumbar region with discogenic back pain only: Secondary | ICD-10-CM | POA: Diagnosis not present

## 2023-07-25 DIAGNOSIS — F411 Generalized anxiety disorder: Secondary | ICD-10-CM

## 2023-07-25 DIAGNOSIS — Z5982 Transportation insecurity: Secondary | ICD-10-CM

## 2023-07-25 DIAGNOSIS — Z8659 Personal history of other mental and behavioral disorders: Secondary | ICD-10-CM

## 2023-07-25 LAB — URINALYSIS, ROUTINE W REFLEX MICROSCOPIC
Bilirubin, UA: NEGATIVE
Glucose, UA: NEGATIVE
Ketones, UA: NEGATIVE
Nitrite, UA: NEGATIVE
Protein,UA: NEGATIVE
RBC, UA: NEGATIVE
Specific Gravity, UA: 1.025 (ref 1.005–1.030)
Urobilinogen, Ur: 0.2 mg/dL (ref 0.2–1.0)
pH, UA: 6.5 (ref 5.0–7.5)

## 2023-07-25 LAB — MICROSCOPIC EXAMINATION
RBC, Urine: NONE SEEN /[HPF] (ref 0–2)
Renal Epithel, UA: NONE SEEN /[HPF]
Yeast, UA: NONE SEEN

## 2023-07-25 MED ORDER — PREDNISONE 20 MG PO TABS
40.0000 mg | ORAL_TABLET | Freq: Every day | ORAL | 0 refills | Status: AC
Start: 2023-07-25 — End: 2023-07-30

## 2023-07-25 MED ORDER — METHOCARBAMOL 750 MG PO TABS: 750.0000 mg | ORAL_TABLET | Freq: Four times a day (QID) | ORAL | 0 refills | Status: AC

## 2023-07-25 NOTE — Progress Notes (Addendum)
Subjective:  Patient ID: Cassandra Tucker, female    DOB: [birth year], 33 y.o.   MRN: [id number]  Patient Care Team: Sonny Masters, FNP as PCP - General (Family Medicine)   Chief Complaint:  Back Pain (Lower back pain on and off since Dec but states it was worse lastnight)   HPI: Cassandra Tucker is a 33 y.o. female presenting on 07/25/2023 for Back Pain (Lower back pain on and off since Dec but states it was worse lastnight)  Back Pain   1. Low back pain, unspecified back pain laterality, unspecified chronicity, unspecified whether sciatica present States that she has middle back pain that is only on the right side. States that it radiates down her right buttock. States that she was told at [facility] likely sciatic pain. States that she followed up with chiropractor and was told it was not sciatic pain. States that last night she woke up in pain and was almost to tears due to pain. Reports that it pulses throughout the day. Reports daily symptoms that vary in intensity. She is taking goody powders as needed for pain. She was positive for BV 06/05/23. Reports that she completed abx course. Denies concern for STI. Denies N/V, fever. She does have history of kidney stone as a teenager. States that it is does not feel similar to that. Aggravated by sitting up, rolling over, sitting for long periods of time. Denies saddle anesthesia or incontinence.   Relevant past medical, surgical, family, and social history reviewed and updated as indicated.  Allergies and medications reviewed and updated. Data reviewed: Chart in Epic.   Past Medical History:  Diagnosis Date   Ankle fracture, left 2000   Anxiety    Arthritis    left wrist and left ankle   Asthma    Back pain    Back pain    Bronchitis    Depression    Diabetes (HCC) 01/11/2016   type II  history of   Family history of adverse reaction to anesthesia    gmother had problems with N/V    GERD (gastroesophageal reflux disease)     Headache    High cholesterol    History of kidney stones    HLD (hyperlipidemia)    HSV-2 infection    Hypertension 2013   Intracranial hypertension    psuedotumor cebrum   Joint pain    Joint pain    Kienbock's disease    Leg edema    OSA (obstructive sleep apnea)    cpap - does not know settings    Papilledema    Prediabetes    Pregnancy induced hypertension    Pseudotumor cerebri syndrome    RLS (restless legs syndrome)    Sinus tachycardia    SOB (shortness of breath)     Past Surgical History:  Procedure Laterality Date   CESAREAN SECTION N/A 07/17/2019   Procedure: CESAREAN SECTION;  Surgeon: Osborn Coho, MD;  Location: [facility] ORS;  Service: Obstetrics;  Laterality: N/A;   FRACTURE SURGERY  2005   ankle   FRACTURE SURGERY  2013   wrist (deteriorating bone)   LAPAROSCOPIC GASTRIC SLEEVE RESECTION N/A 05/08/2017   Procedure: LAPAROSCOPIC GASTRIC SLEEVE RESECTION;  Surgeon: Berna Bue, MD;  Location: [facility] ORS;  Service: General;  Laterality: N/A;   LAPAROSCOPIC GASTRIC SLEEVE RESECTION  05/08/2017   UPPER GI ENDOSCOPY  05/08/2017   Procedure: UPPER GI ENDOSCOPY;  Surgeon: Berna Bue, MD;  Location: [facility] ORS;  Service: General;;   urethra stretch     WRIST SURGERY  2015    Social History   Socioeconomic History   Marital status: Married    Spouse name: Neil Crouch   Number of children: 0   Years of education: 12+   Highest education level: Some college, no degree  Occupational History   Occupation: Engineer, drilling: [employer]  Tobacco Use   Smoking status: Every Day    Current packs/day: 0.00    Types: Cigarettes    Last attempt to quit: 10/05/2018    Years since quitting: 4.8   Smokeless tobacco: Never  Vaping Use   Vaping status: Never Used  Substance and Sexual Activity   Alcohol use: Not Currently   Drug use: Not Currently    Types: Marijuana   Sexual activity: Yes    Birth control/protection: I.U.D.    Comment: condoms previously  Other  Topics Concern   Not on file  Social History Narrative   Lives with grandparents   Caffeine use: Drinks coffee/tea/soda- 20oz per day (none after starting diamox)   Social Drivers of Health   Financial Resource Strain: Medium Risk (02/01/2023)   Overall Financial Resource Strain (CARDIA)    Difficulty of Paying Living Expenses: Somewhat hard  Food Insecurity: No Food Insecurity (02/01/2023)   Hunger Vital Sign    Worried About Running Out of Food in the Last Year: Never true    Ran Out of Food in the Last Year: Never true  Transportation Needs: No Transportation Needs (02/01/2023)   PRAPARE - Administrator, Civil Service (Medical): No    Lack of Transportation (Non-Medical): No  Physical Activity: Unknown (02/01/2023)   Exercise Vital Sign    Days of Exercise per Week: 0 days    Minutes of Exercise per Session: Not on file  Stress: Stress Concern Present (02/01/2023)   [employer] of Occupational Health - Occupational Stress Questionnaire    Feeling of Stress : Very much  Social Connections: Unknown (02/19/2023)   Received from [facility]   Social Network    Social Network: Not on file  Intimate Partner Violence: Unknown (02/19/2023)   Received from [facility]   HITS    Physically Hurt: Not on file    Insult or Talk Down To: Not on file    Threaten Physical Harm: Not on file    Scream or Curse: Not on file    Outpatient Encounter Medications as of 07/25/2023  Medication Sig   acetaZOLAMIDE (DIAMOX) 250 MG tablet Take 500 mg by mouth 2 (two) times daily.   albuterol (PROVENTIL) (2.5 MG/3ML) 0.083% nebulizer solution Take 3 mLs (2.5 mg total) by nebulization every 4 (four) hours as needed for wheezing or shortness of breath (coughing fits).   atorvastatin (LIPITOR) 40 MG tablet Take 1 tablet (40 mg total) by mouth daily.   azelastine (ASTELIN) 0.1 % nasal spray Place 1 spray into both nostrils daily as needed for rhinitis. Use in each nostril as directed    Benralizumab (FASENRA PEN) 30 MG/ML SOAJ INJECT 1 PEN UNDER THE SKIN EVERY 8 WEEKS.   cromolyn (OPTICROM) 4 % ophthalmic solution Place 1 drop into both eyes 4 (four) times daily as needed (itchy/watery eyes).   famotidine (PEPCID) 20 MG tablet Take by mouth.   fenofibrate (TRICOR) 145 MG tablet Take 1 tablet (145 mg total) by mouth daily.   fluticasone (FLONASE) 50 MCG/ACT nasal spray Place 1 spray into both nostrils 2 (two)  times daily as needed (nasal congestion).   levocetirizine (XYZAL) 5 MG tablet Take 1 tablet (5 mg total) by mouth every evening.   levonorgestrel (MIRENA, 52 MG,) 20 MCG/24HR IUD Mirena 20 mcg/24 hours (6 yrs) 52 mg intrauterine device  Take 1 device by intrauterine route.   lisinopril-hydrochlorothiazide (ZESTORETIC) 20-25 MG tablet Take 1 tablet by mouth daily.   omeprazole (PRILOSEC) 20 MG capsule Take 1 capsule (20 mg total) by mouth daily.   tirzepatide (MOUNJARO) 15 MG/0.5ML Pen Inject 15 mg into the skin once a week.   VENTOLIN HFA 108 (90 Base) MCG/ACT inhaler Inhale 2 puffs into the lungs every 4 (four) hours as needed for wheezing or shortness of breath.   Budeson-Glycopyrrol-Formoterol (BREZTRI AEROSPHERE) 160-9-4.8 MCG/ACT AERO Inhale 2 puffs into the lungs in the morning and at bedtime. with spacer and rinse mouth afterwards. (Patient not taking: Reported on 07/25/2023)   NIFEdipine (ADALAT CC) 60 MG 24 hr tablet Take 60 mg by mouth every morning.   VRAYLAR 4.5 MG CAPS Take 1 capsule (4.5 mg total) by mouth daily. (Patient not taking: Reported on 07/25/2023)   [DISCONTINUED] cetirizine (ZYRTEC) 10 MG tablet Take 10 mg by mouth daily.   [DISCONTINUED] methylPREDNISolone (MEDROL DOSEPAK) 4 MG TBPK tablet Take 6 tablets on day 1, 5 tablets on day 2, 4 tabs on day 3, 3 tabs on day 4, 2 tabs on day 5, 1 tab on day 6.   No facility-administered encounter medications on file as of 07/25/2023.    Allergies  Allergen Reactions   Cats Claw (Uncaria Tomentosa) Itching,  Shortness Of Breath and Swelling   Dust Mite Extract Itching and Shortness Of Breath   Grass Pollen(K-O-R-T-Swt Vern) Itching and Shortness Of Breath   Mixed Feathers Itching, Shortness Of Breath and Swelling   Uncaria Tomentosa (Cats Claw) Itching and Swelling   Gramineae Pollens Other (See Comments)   Lactose Other (See Comments)   Pollen Extract Other (See Comments)    Review of Systems  Musculoskeletal:  Positive for back pain.   As per HPI  Objective:  BP (!) 156/90   Pulse 75   Temp (!) 97.3 F (36.3 C)   Ht 5\' 6"  (1.676 m)   Wt 245 lb (111.1 kg)   SpO2 100%   BMI 39.54 kg/m    Wt Readings from Last 3 Encounters:  07/25/23 245 lb (111.1 kg)  05/22/23 240 lb 3.2 oz (109 kg)  02/01/23 264 lb 12.8 oz (120.1 kg)    Physical Exam Constitutional:      General: She is awake. She is not in acute distress.    Appearance: Normal appearance. She is well-developed and well-groomed. She is obese. She is not ill-appearing, toxic-appearing or diaphoretic.  Cardiovascular:     Rate and Rhythm: Normal rate and regular rhythm.     Pulses: Normal pulses.          Radial pulses are 2+ on the right side and 2+ on the left side.       Posterior tibial pulses are 2+ on the right side and 2+ on the left side.     Heart sounds: Normal heart sounds. No murmur heard.    No gallop.  Pulmonary:     Effort: Pulmonary effort is normal. No respiratory distress.     Breath sounds: Normal breath sounds. No stridor. No wheezing, rhonchi or rales.  Abdominal:     General: Abdomen is flat. Bowel sounds are normal. There is no distension.  Palpations: Abdomen is soft.     Tenderness: There is no abdominal tenderness. There is right CVA tenderness. There is no left CVA tenderness.     Hernia: No hernia is present.  Musculoskeletal:     Cervical back: Full passive range of motion without pain and neck supple.     Lumbar back: Spasms and tenderness present. No swelling, edema, deformity, signs of  trauma, lacerations or bony tenderness. Normal range of motion. Negative right straight leg raise test and negative left straight leg raise test. No scoliosis.     Right lower leg: No edema.     Left lower leg: No edema.     Comments: Hard, muscle tightening along right lower back/paraspinal muscle   Skin:    General: Skin is warm.     Capillary Refill: Capillary refill takes less than 2 seconds.  Neurological:     General: No focal deficit present.     Mental Status: She is alert, oriented to person, place, and time and easily aroused. Mental status is at baseline.     GCS: GCS eye subscore is 4. GCS verbal subscore is 5. GCS motor subscore is 6.     Motor: No weakness.  Psychiatric:        Attention and Perception: Attention and perception normal.        Mood and Affect: Mood and affect normal.        Speech: Speech normal.        Behavior: Behavior normal. Behavior is cooperative.        Thought Content: Thought content normal. Thought content does not include homicidal or suicidal ideation. Thought content does not include homicidal or suicidal plan.        Cognition and Memory: Cognition and memory normal.        Judgment: Judgment normal.     Results for orders placed or performed in visit on 05/22/23  Microscopic Examination   Collection Time: 05/22/23 12:41 PM   Urine  Result Value Ref Range   WBC, UA 6-10 (A) 0 - 5 /hpf   RBC, Urine 0-2 0 - 2 /hpf   Epithelial Cells (non renal) 0-10 0 - 10 /hpf   Bacteria, UA Many (A) None seen/Few  Bayer DCA Hb A1c Waived   Collection Time: 05/22/23 12:41 PM  Result Value Ref Range   HB A1C (BAYER DCA - WAIVED) 4.7 (L) 4.8 - 5.6 %  Urinalysis, Routine w reflex microscopic   Collection Time: 05/22/23 12:41 PM  Result Value Ref Range   Specific Gravity, UA >1.030 (H) 1.005 - 1.030   pH, UA 5.5 5.0 - 7.5   Color, UA Orange Yellow   Appearance Ur Cloudy (A) Clear   Leukocytes,UA 1+ (A) Negative   Protein,UA Trace (A) Negative/Trace    Glucose, UA Negative Negative   Ketones, UA Trace (A) Negative   RBC, UA Negative Negative   Bilirubin, UA Negative Negative   Urobilinogen, Ur 0.2 0.2 - 1.0 mg/dL   Nitrite, UA Negative Negative   Microscopic Examination See below:   CMP14+EGFR   Collection Time: 05/22/23 12:43 PM  Result Value Ref Range   Glucose 83 70 - 99 mg/dL   BUN 11 6 - 20 mg/dL   Creatinine, Ser 4.09 0.57 - 1.00 mg/dL   eGFR 95 >81 XB/JYN/8.29   BUN/Creatinine Ratio 13 9 - 23   Sodium 140 134 - 144 mmol/L   Potassium 4.3 3.5 - 5.2 mmol/L   Chloride 107 (H)  96 - 106 mmol/L   CO2 20 20 - 29 mmol/L   Calcium 9.2 8.7 - 10.2 mg/dL   Total Protein 6.4 6.0 - 8.5 g/dL   Albumin 4.1 3.9 - 4.9 g/dL   Globulin, Total 2.3 1.5 - 4.5 g/dL   Bilirubin Total 0.9 0.0 - 1.2 mg/dL   Alkaline Phosphatase 65 44 - 121 IU/L   AST 8 0 - 40 IU/L   ALT 8 0 - 32 IU/L  CBC with Differential/Platelet   Collection Time: 05/22/23 12:43 PM  Result Value Ref Range   WBC 6.8 3.4 - 10.8 x10E3/uL   RBC 5.16 3.77 - 5.28 x10E6/uL   Hemoglobin 14.4 11.1 - 15.9 g/dL   Hematocrit 16.1 09.6 - 46.6 %   MCV 86 79 - 97 fL   MCH 27.9 26.6 - 33.0 pg   MCHC 32.4 31.5 - 35.7 g/dL   RDW 04.5 40.9 - 81.1 %   Platelets 246 150 - 450 x10E3/uL   Neutrophils 63 Not Estab. %   Lymphs 31 Not Estab. %   Monocytes 6 Not Estab. %   Eos 0 Not Estab. %   Basos 0 Not Estab. %   Neutrophils Absolute 4.2 1.4 - 7.0 x10E3/uL   Lymphocytes Absolute 2.1 0.7 - 3.1 x10E3/uL   Monocytes Absolute 0.4 0.1 - 0.9 x10E3/uL   EOS (ABSOLUTE) 0.0 0.0 - 0.4 x10E3/uL   Basophils Absolute 0.0 0.0 - 0.2 x10E3/uL   Immature Granulocytes 0 Not Estab. %   Immature Grans (Abs) 0.0 0.0 - 0.1 x10E3/uL  Thyroid Panel With TSH   Collection Time: 05/22/23 12:43 PM  Result Value Ref Range   TSH 1.260 0.450 - 4.500 uIU/mL   T4, Total 8.8 4.5 - 12.0 ug/dL   T3 Uptake Ratio 26 24 - 39 %   Free Thyroxine Index 2.3 1.2 - 4.9  Lipid panel   Collection Time: 05/22/23 12:43 PM   Result Value Ref Range   Cholesterol, Total 187 100 - 199 mg/dL   Triglycerides 92 0 - 149 mg/dL   HDL 35 (L) >91 mg/dL   VLDL Cholesterol Cal 17 5 - 40 mg/dL   LDL Chol Calc (NIH) 478 (H) 0 - 99 mg/dL   Chol/HDL Ratio 5.3 (H) 0.0 - 4.4 ratio  VITAMIN D 25 Hydroxy (Vit-D Deficiency, Fractures)   Collection Time: 05/22/23 12:43 PM  Result Value Ref Range   Vit D, 25-Hydroxy 25.1 (L) 30.0 - 100.0 ng/mL  Vitamin B12   Collection Time: 05/22/23 12:43 PM  Result Value Ref Range   Vitamin B-12 362 232 - 1,245 pg/mL  Urine cytology ancillary only   Collection Time: 05/22/23  1:56 PM  Result Value Ref Range   Neisseria Gonorrhea Negative    Chlamydia Negative    Trichomonas Negative    Comment Normal Reference Range Trichomonas - Negative    Comment Normal Reference Ranger Chlamydia - Negative    Comment      Normal Reference Range Neisseria Gonorrhea - Negative  Urine Culture   Collection Time: 05/22/23  2:03 PM   Specimen: Urine   UR  Result Value Ref Range   Urine Culture, Routine Final report (A)    Organism ID, Bacteria Comment (A)    Antimicrobial Susceptibility Comment        05/22/2023   12:29 PM 02/01/2023    2:13 PM 10/18/2022   10:39 AM 08/08/2022    2:48 PM 01/13/2022    2:13 PM  Depression screen PHQ 2/9  Decreased Interest 2 3 2 1 2   Down, Depressed, Hopeless 2 2 2 2 2   PHQ - 2 Score 4 5 4 3 4   Altered sleeping 2 1 2 1 3   Tired, decreased energy 3 2 3 2 3   Change in appetite 2 2 3 3 3   Feeling bad or failure about yourself  2 0 0 0 0  Trouble concentrating 2 2 3 2 3   Moving slowly or fidgety/restless 2 2 2 1 3   Suicidal thoughts 0 0 0 0 0  PHQ-9 Score 17 14 17 12 19   Difficult doing work/chores Somewhat difficult Very difficult Very difficult Somewhat difficult Extremely dIfficult       05/22/2023   12:29 PM 02/01/2023    2:13 PM 10/18/2022   10:39 AM 08/08/2022    2:48 PM  GAD 7 : Generalized Anxiety Score  Nervous, Anxious, on Edge 3 3 3 3    Control/stop worrying 3 3 3 2   Worry too much - different things 3 3 3 2   Trouble relaxing 3 3 3 2   Restless 3 3 3 2   Easily annoyed or irritable 3 3 3 3   Afraid - awful might happen 1 0 0 0  Total GAD 7 Score 19 18 18 14   Anxiety Difficulty Somewhat difficult Very difficult Very difficult       Pertinent labs & imaging results that were available during my care of the patient were reviewed by me and considered in my medical decision making.  Assessment & Plan:  Yarelli was seen today for back pain.  Diagnoses and all orders for this visit:  Low back pain, unspecified back pain laterality, unspecified chronicity, unspecified whether sciatica present Negative for hematuria to suggest renal stone. Will await results of culture. Imaging as below. Will communicate results to patient once available. Will await results to determine next steps.  Will start with conservative management as below. Discussed red flag symptoms with patient. Discussed side effects of medications. -     Urinalysis, Routine w reflex microscopic -     Urine Culture -     predniSONE (DELTASONE) 20 MG tablet; Take 2 tablets (40 mg total) by mouth daily with breakfast for 5 days. -     methocarbamol (ROBAXIN-750) 750 MG tablet; Take 1 tablet (750 mg total) by mouth 4 (four) times daily for 7 days. -     DG Lumbar Spine 2-3 Views  GAD (generalized anxiety disorder) Stable. Denies SI. Patient to follow up with PCP  History of depression As above.   Transportation insecurity Would like to complete imaging today as patient does not have consistent transportation to and from appts.   Continue all other maintenance medications.  Follow up plan: Return if symptoms worsen or fail to improve.   Continue healthy lifestyle choices, including diet (rich in fruits, vegetables, and lean proteins, and low in salt and simple carbohydrates) and exercise (at least 30 minutes of moderate physical activity daily).  Written  and verbal instructions provided   The above assessment and management plan was discussed with the patient. The patient verbalized understanding of and has agreed to the management plan. Patient is aware to call the clinic if they develop any new symptoms or if symptoms persist or worsen. Patient is aware when to return to the clinic for a follow-up visit. Patient educated on when it is appropriate to go to the emergency department.   Neale Burly, DNP-FNP [facility] [address] [place], [place]  [address] [phone] 

## 2023-07-25 NOTE — Telephone Encounter (Signed)
Copied from CRM [id number]. Topic: Clinical - Red Word Triage >> Jul 25, 2023 10:46 AM Elle L wrote: Red Word that prompted transfer to Nurse Triage: The patient has extreme pain in the right side of her back that started last night. She states that it feels like a slipped disk or kidney stones.  Chief Complaint: back pain Symptoms: pain Frequency: comes and goes Pertinent Negatives: Patient denies fever, urinary symptoms, numbness and tingling.  Disposition: [] ED /[] Urgent Care (no appt availability in office) / [x] Appointment(In office/virtual)/ []  [place] [facility]/ [] Home Care/ [] Refused Recommended Disposition /[] [place] [facility]/ []  Follow-up with PCP Additional Notes: apt made for this afternoon.  Care advice given denies questions, instructed to go to er if becomes worse.   Reason for Disposition  [1] MODERATE back pain (e.g., interferes with normal activities) AND [2] present > 3 days  Answer Assessment - Initial Assessment Questions 1. ONSET: "When did the pain begin?"      Last night was crying due to pain.  Has had this before and was seen in the er for this. 2. LOCATION: "Where does it hurt?" (upper, mid or lower back)     Right side of back, mid back and goes to top of right buttock to left side 3. SEVERITY: "How bad is the pain?"  (e.g., Scale 1-10; mild, moderate, or severe)   - MILD (1-3): Doesn't interfere with normal activities.    - MODERATE (4-7): Interferes with normal activities or awakens from sleep.    - SEVERE (8-10): Excruciating pain, unable to do any normal activities.      5-6/10 4. PATTERN: "Is the pain constant?" (e.g., yes, no; constant, intermittent)      Constant at times but does come and go 5. RADIATION: "Does the pain shoot into your legs or somewhere else?"     denies 6. CAUSE:  "What do you think is causing the back pain?"      unknown 7. BACK OVERUSE:  "Any recent lifting of heavy objects, strenuous work or exercise?"     no 8.  MEDICINES: "What have you taken so far for the pain?" (e.g., nothing, acetaminophen, NSAIDS)     advil 9. NEUROLOGIC SYMPTOMS: "Do you have any weakness, numbness, or problems with bowel/bladder control?"     denies 10. OTHER SYMPTOMS: "Do you have any other symptoms?" (e.g., fever, abdomen pain, burning with urination, blood in urine)       Denies.  11. PREGNANCY: "Is there any chance you are pregnant?" "When was your last menstrual period?"       na  Protocols used: Back Pain-A-AH

## 2023-07-25 NOTE — Patient Instructions (Signed)
TENS unit

## 2023-07-25 NOTE — Telephone Encounter (Signed)
Patient has apt today with DOD

## 2023-07-27 LAB — URINE CULTURE

## 2023-07-30 ENCOUNTER — Encounter: Payer: Self-pay | Admitting: Family Medicine

## 2023-07-30 NOTE — Progress Notes (Signed)
 Negative UTI. Recommend follow up if symptoms continue

## 2023-08-02 ENCOUNTER — Ambulatory Visit: Payer: Medicaid Other | Admitting: Allergy

## 2023-08-02 NOTE — Progress Notes (Deleted)
 Follow Up Note  RE: Cassandra Tucker MRN: [id number] DOB: 12-08-1990 Date of Office Visit: 08/02/2023  Referring provider: Sonny Masters, FNP Primary care provider: Sonny Masters, FNP  Chief Complaint: No chief complaint on file.  History of Present Illness: I had the pleasure of seeing Cassandra Tucker for a follow up visit at the [facility] on 08/02/2023. She is a 33 y.o. female, who is being followed for asthma on Fasenra and allergic rhino conjunctivitis. Her previous allergy office visit was on 05/31/2023 with Dr. Selena Batten. Today is a regular follow up visit.  Discussed the use of AI scribe software for clinical note transcription with the patient, who gave verbal consent to proceed.  History of Present Illness            ***  Assessment and Plan: [name] is a 33 y.o. female with: Not well controlled severe persistent asthma Past history - Symptoms of chest tightness, shortness of breath, coughing, wheezing, nocturnal awakenings for 1 year. History of reflux but no longer on PPI. Ex-smoker. 2021 spirometry showed: normal pattern and 13% improvement in FEV1 post bronchodilator treatment. Clinically feeling better.  Interim history - Recent pneumonia with subsequent increase in nocturnal symptoms and nebulizer use. Currently on Symbicort 160/4.5, Ventolin, and Fasenra injections.  Today's spirometry was normal - slight decline from previous. Continue Fasenra injections at home every 8 weeks.  Nebulizer machine given.  Take medrol pak as instructed.  Daily controller medication(s): Continue Singulair (montelukast) 10mg  daily at night. Start start Breztri 2 puffs twice a day with spacer and rinse mouth afterwards. Samples given.  May use albuterol rescue inhaler 2 puffs or nebulizer every 4 to 6 hours as needed for shortness of breath, chest tightness, coughing, and wheezing.  Monitor frequency of use - if you need to use it more than twice per week on a  consistent basis let us know.  Get spirometry at next visit. If no improvement consider switching biologic agent.   Seasonal allergic rhinitis due to pollen Allergic rhinitis due to dust mite Allergic rhinitis due to animal dander Allergic conjunctivitis of both eyes Past history - 2021 skin testing showed: Positive to grass, dust mites, cats, feathers. Negative to common foods.  Interim history - stable.  Continue environmental control measures. May use over the counter antihistamines such as Zyrtec (cetirizine), Claritin (loratadine), Allegra (fexofenadine), or Xyzal (levocetirizine) daily. May take it twice a day if needed.  May use Flonase (fluticasone) nasal spray 1 spray per nostril twice a day as needed for nasal congestion.  Assessment and Plan              No follow-ups on file.  No orders of the defined types were placed in this encounter.  Lab Orders  No laboratory test(s) ordered today    Diagnostics: Spirometry:  Tracings reviewed. Her effort: {Blank single:19197::"Good reproducible efforts.","It was hard to get consistent efforts and there is a question as to whether this reflects a maximal maneuver.","Poor effort, data can not be interpreted."} FVC: ***L FEV1: ***L, ***% predicted FEV1/FVC ratio: ***% Interpretation: {Blank single:19197::"Spirometry consistent with mild obstructive disease","Spirometry consistent with moderate obstructive disease","Spirometry consistent with severe obstructive disease","Spirometry consistent with possible restrictive disease","Spirometry consistent with mixed obstructive and restrictive disease","Spirometry uninterpretable due to technique","Spirometry consistent with normal pattern","No overt abnormalities noted given today's efforts"}.  Please see scanned spirometry results for details.  Results discussed with patient/family.   Medication List:  Current Outpatient Medications  Medication Sig Dispense  Refill   acetaZOLAMIDE  (DIAMOX) 250 MG tablet Take 500 mg by mouth 2 (two) times daily.     albuterol (PROVENTIL) (2.5 MG/3ML) 0.083% nebulizer solution Take 3 mLs (2.5 mg total) by nebulization every 4 (four) hours as needed for wheezing or shortness of breath (coughing fits). 75 mL 1   atorvastatin (LIPITOR) 40 MG tablet Take 1 tablet (40 mg total) by mouth daily. 90 tablet 3   azelastine (ASTELIN) 0.1 % nasal spray Place 1 spray into both nostrils daily as needed for rhinitis. Use in each nostril as directed     Benralizumab (FASENRA PEN) 30 MG/ML SOAJ INJECT 1 PEN UNDER THE SKIN EVERY 8 WEEKS. 1 mL 6   Budeson-Glycopyrrol-Formoterol (BREZTRI AEROSPHERE) 160-9-4.8 MCG/ACT AERO Inhale 2 puffs into the lungs in the morning and at bedtime. with spacer and rinse mouth afterwards. (Patient not taking: Reported on 07/25/2023) 10.7 g 2   cromolyn (OPTICROM) 4 % ophthalmic solution Place 1 drop into both eyes 4 (four) times daily as needed (itchy/watery eyes). 10 mL 3   famotidine (PEPCID) 20 MG tablet Take by mouth.     fenofibrate (TRICOR) 145 MG tablet Take 1 tablet (145 mg total) by mouth daily. 30 tablet 5   fluticasone (FLONASE) 50 MCG/ACT nasal spray Place 1 spray into both nostrils 2 (two) times daily as needed (nasal congestion). 16 g 5   levocetirizine (XYZAL) 5 MG tablet Take 1 tablet (5 mg total) by mouth every evening. 90 tablet 3   levonorgestrel (MIRENA, 52 MG,) 20 MCG/24HR IUD Mirena 20 mcg/24 hours (6 yrs) 52 mg intrauterine device  Take 1 device by intrauterine route.     lisinopril-hydrochlorothiazide (ZESTORETIC) 20-25 MG tablet Take 1 tablet by mouth daily. 90 tablet 1   NIFEdipine (ADALAT CC) 60 MG 24 hr tablet Take 60 mg by mouth every morning.     omeprazole (PRILOSEC) 20 MG capsule Take 1 capsule (20 mg total) by mouth daily. 30 capsule 3   tirzepatide (MOUNJARO) 15 MG/0.5ML Pen Inject 15 mg into the skin once a week. 6 mL 3   VENTOLIN HFA 108 (90 Base) MCG/ACT inhaler Inhale 2 puffs into the lungs  every 4 (four) hours as needed for wheezing or shortness of breath. 18 g 1   VRAYLAR 4.5 MG CAPS Take 1 capsule (4.5 mg total) by mouth daily. (Patient not taking: Reported on 07/25/2023) 90 capsule 1   No current facility-administered medications for this visit.   Allergies: Allergies  Allergen Reactions   Cats Claw (Uncaria Tomentosa) Itching, Shortness Of Breath and Swelling   Dust Mite Extract Itching and Shortness Of Breath   Grass Pollen(K-O-R-T-Swt Vern) Itching and Shortness Of Breath   Mixed Feathers Itching, Shortness Of Breath and Swelling   Uncaria Tomentosa (Cats Claw) Itching and Swelling   Gramineae Pollens Other (See Comments)   Lactose Other (See Comments)   Pollen Extract Other (See Comments)   I reviewed her past medical history, social history, family history, and environmental history and no significant changes have been reported from her previous visit.  Review of Systems  Constitutional:  Negative for appetite change, chills, fever and unexpected weight change.  HENT:  Negative for congestion, postnasal drip, rhinorrhea and sneezing.   Eyes:  Negative for itching.  Respiratory:  Positive for shortness of breath. Negative for cough, chest tightness and wheezing.   Cardiovascular:  Negative for chest pain.  Gastrointestinal:  Negative for abdominal pain.  Genitourinary:  Negative for difficulty urinating.  Skin:  Negative for rash.  Allergic/Immunologic: Positive for environmental allergies. Negative for food allergies.  Neurological:  Negative for headaches.    Objective: There were no vitals taken for this visit. There is no height or weight on file to calculate BMI. Physical Exam Vitals and nursing note reviewed.  Constitutional:      Appearance: Normal appearance. She is well-developed. She is obese.  HENT:     Head: Normocephalic and atraumatic.     Right Ear: Tympanic membrane and external ear normal.     Left Ear: Tympanic membrane and external ear  normal.     Nose: Nose normal.     Mouth/Throat:     Mouth: Mucous membranes are moist.     Pharynx: Oropharynx is clear.  Eyes:     Conjunctiva/sclera: Conjunctivae normal.  Cardiovascular:     Rate and Rhythm: Normal rate and regular rhythm.     Heart sounds: Normal heart sounds. No murmur heard.    No friction rub. No gallop.  Pulmonary:     Effort: Pulmonary effort is normal.     Breath sounds: No wheezing, rhonchi or rales.  Musculoskeletal:     Cervical back: Neck supple.  Skin:    General: Skin is warm.     Findings: No rash.  Neurological:     Mental Status: She is alert and oriented to person, place, and time.  Psychiatric:        Behavior: Behavior normal.    Previous notes and tests were reviewed. The plan was reviewed with the patient/family, and all questions/concerned were addressed.  It was my pleasure to see Julian today and participate in her care. Please feel free to contact me with any questions or concerns.  Sincerely,  Wyline Mood, DO Allergy & Immunology  [facility] office: [phone] [facility] office: [phone]

## 2023-08-09 ENCOUNTER — Encounter: Payer: Self-pay | Admitting: Family Medicine

## 2023-08-09 DIAGNOSIS — M545 Low back pain, unspecified: Secondary | ICD-10-CM

## 2023-08-09 DIAGNOSIS — M51362 Other intervertebral disc degeneration, lumbar region with discogenic back pain and lower extremity pain: Secondary | ICD-10-CM

## 2023-08-09 NOTE — Progress Notes (Signed)
 Degenerative disc disease, can place a referral to PT and ortho if patient would like.

## 2023-08-20 ENCOUNTER — Encounter: Payer: Self-pay | Admitting: Family Medicine

## 2023-08-21 ENCOUNTER — Ambulatory Visit: Payer: Medicaid Other | Admitting: Family Medicine

## 2023-08-28 ENCOUNTER — Ambulatory Visit: Admitting: Family Medicine

## 2023-08-28 ENCOUNTER — Encounter: Payer: Self-pay | Admitting: Family Medicine

## 2023-08-28 VITALS — BP 150/87 | HR 91 | Temp 97.9°F | Ht 66.0 in | Wt 248.4 lb

## 2023-08-28 DIAGNOSIS — B354 Tinea corporis: Secondary | ICD-10-CM | POA: Diagnosis not present

## 2023-08-28 DIAGNOSIS — E1159 Type 2 diabetes mellitus with other circulatory complications: Secondary | ICD-10-CM | POA: Diagnosis not present

## 2023-08-28 DIAGNOSIS — F411 Generalized anxiety disorder: Secondary | ICD-10-CM

## 2023-08-28 DIAGNOSIS — E119 Type 2 diabetes mellitus without complications: Secondary | ICD-10-CM | POA: Diagnosis not present

## 2023-08-28 DIAGNOSIS — E1169 Type 2 diabetes mellitus with other specified complication: Secondary | ICD-10-CM

## 2023-08-28 DIAGNOSIS — K219 Gastro-esophageal reflux disease without esophagitis: Secondary | ICD-10-CM

## 2023-08-28 DIAGNOSIS — Z1339 Encounter for screening examination for other mental health and behavioral disorders: Secondary | ICD-10-CM | POA: Diagnosis not present

## 2023-08-28 DIAGNOSIS — Z7985 Long-term (current) use of injectable non-insulin antidiabetic drugs: Secondary | ICD-10-CM

## 2023-08-28 DIAGNOSIS — I152 Hypertension secondary to endocrine disorders: Secondary | ICD-10-CM | POA: Diagnosis not present

## 2023-08-28 DIAGNOSIS — E785 Hyperlipidemia, unspecified: Secondary | ICD-10-CM | POA: Diagnosis not present

## 2023-08-28 DIAGNOSIS — M51362 Other intervertebral disc degeneration, lumbar region with discogenic back pain and lower extremity pain: Secondary | ICD-10-CM

## 2023-08-28 DIAGNOSIS — Z6841 Body Mass Index (BMI) 40.0 and over, adult: Secondary | ICD-10-CM

## 2023-08-28 DIAGNOSIS — Z124 Encounter for screening for malignant neoplasm of cervix: Secondary | ICD-10-CM | POA: Diagnosis not present

## 2023-08-28 DIAGNOSIS — E559 Vitamin D deficiency, unspecified: Secondary | ICD-10-CM | POA: Diagnosis not present

## 2023-08-28 DIAGNOSIS — F339 Major depressive disorder, recurrent, unspecified: Secondary | ICD-10-CM

## 2023-08-28 DIAGNOSIS — Z01419 Encounter for gynecological examination (general) (routine) without abnormal findings: Secondary | ICD-10-CM | POA: Diagnosis not present

## 2023-08-28 LAB — BAYER DCA HB A1C WAIVED: HB A1C (BAYER DCA - WAIVED): 4.8 % (ref 4.8–5.6)

## 2023-08-28 MED ORDER — PREDNISONE 20 MG PO TABS: 40.0000 mg | ORAL_TABLET | Freq: Every day | ORAL | 0 refills | Status: AC

## 2023-08-28 MED ORDER — LISINOPRIL-HYDROCHLOROTHIAZIDE 20-25 MG PO TABS: 1.0000 | ORAL_TABLET | Freq: Every day | ORAL | 1 refills | Status: AC

## 2023-08-28 MED ORDER — FENOFIBRATE 145 MG PO TABS
145.0000 mg | ORAL_TABLET | Freq: Every day | ORAL | 1 refills | Status: DC
Start: 2023-08-28 — End: 2023-12-20

## 2023-08-28 MED ORDER — OMEPRAZOLE 20 MG PO CPDR: 20.0000 mg | DELAYED_RELEASE_CAPSULE | Freq: Every day | ORAL | 1 refills | Status: AC

## 2023-08-28 NOTE — Progress Notes (Signed)
 Subjective:  Patient ID: Cassandra Tucker, female    DOB: 1990-07-10, 33 y.o.   MRN: [id number]  Patient Care Team: Sonny Masters, FNP as PCP - General (Family Medicine)   Chief Complaint:  Medical Management of Chronic Issues (3 month chronic follow up )   HPI: Cassandra Tucker is a 33 y.o. female presenting on 08/28/2023 for Medical Management of Chronic Issues (3 month chronic follow up )   Discussed the use of AI scribe software for clinical note transcription with the patient, who gave verbal consent to proceed.  History of Present Illness   Cassandra Tucker is a 33 year old female with degenerative disc disease who presents with back pain.  She experiences back pain attributed to degenerative disc disease, which has worsened over time. Initially isolated, the pain now spreads to other areas and is severe enough that ibuprofen does not provide relief. There is significant swelling in her back, but no radiation down her leg.  She is currently on medications for blood pressure and cholesterol without issues. She takes tirzepatide Greggory Keen) 15 mg for type 2 diabetes mellitus, with stable blood sugars and no major side effects. For anxiety and depression, she takes fluoxetine at a total dose of 60 mg, though she is uncertain about the exact medication name. She previously took [employer] but discontinued it due to dissatisfaction with her previous psychiatrist. Her acid reflux is well-controlled with omeprazole, and she denies any cough associated with lisinopril use.  She experiences headaches and has not seen an eye doctor recently, despite no significant changes in vision. No issues with her feet, although she experiences tickling sensations during examination.          Relevant past medical, surgical, family, and social history reviewed and updated as indicated.  Allergies and medications reviewed and updated. Data reviewed: Chart in Epic.   Past Medical History:   Diagnosis Date   Ankle fracture, left 2000   Anxiety    Arthritis    left wrist and left ankle   Asthma    Back pain    Back pain    Bronchitis    Depression    Diabetes (HCC) 01/11/2016   type II  history of   Family history of adverse reaction to anesthesia    gmother had problems with N/V    GERD (gastroesophageal reflux disease)    Headache    High cholesterol    History of kidney stones    HLD (hyperlipidemia)    HSV-2 infection    Hypertension 2013   Intracranial hypertension    psuedotumor cebrum   Joint pain    Joint pain    Kienbock's disease    Leg edema    OSA (obstructive sleep apnea)    cpap - does not know settings    Papilledema    Prediabetes    Pregnancy induced hypertension    Pseudotumor cerebri syndrome    RLS (restless legs syndrome)    Sinus tachycardia    SOB (shortness of breath)     Past Surgical History:  Procedure Laterality Date   CESAREAN SECTION N/A 07/17/2019   Procedure: CESAREAN SECTION;  Surgeon: Osborn Coho, MD;  Location: [facility];  Service: Obstetrics;  Laterality: N/A;   FRACTURE SURGERY  2005   ankle   FRACTURE SURGERY  2013   wrist (deteriorating bone)   LAPAROSCOPIC GASTRIC SLEEVE RESECTION N/A 05/08/2017   Procedure: LAPAROSCOPIC GASTRIC SLEEVE RESECTION;  Surgeon: Fredricka Bonine,  Lady Deutscher, MD;  Location: [facility];  Service: General;  Laterality: N/A;   LAPAROSCOPIC GASTRIC SLEEVE RESECTION  05/08/2017   UPPER GI ENDOSCOPY  05/08/2017   Procedure: UPPER GI ENDOSCOPY;  Surgeon: Berna Bue, MD;  Location: [facility];  Service: General;;   urethra stretch     WRIST SURGERY  2015    Social History   Socioeconomic History   Marital status: Married    Spouse name: Neil Crouch   Number of children: 0   Years of education: 12+   Highest education level: Some college, no degree  Occupational History   Occupation: Engineer, drilling: [employer]  Tobacco Use   Smoking status: Every Day    Current packs/day: 0.00    Types:  Cigarettes    Last attempt to quit: 10/05/2018    Years since quitting: 4.8   Smokeless tobacco: Never  Vaping Use   Vaping status: Never Used  Substance and Sexual Activity   Alcohol use: Not Currently   Drug use: Not Currently    Types: Marijuana   Sexual activity: Yes    Birth control/protection: I.U.D.    Comment: condoms previously  Other Topics Concern   Not on file  Social History Narrative   Lives with grandparents   Caffeine use: Drinks coffee/tea/soda- 20oz per day (none after starting diamox)   Social Drivers of Health   Financial Resource Strain: Medium Risk (02/01/2023)   Overall Financial Resource Strain (CARDIA)    Difficulty of Paying Living Expenses: Somewhat hard  Food Insecurity: No Food Insecurity (02/01/2023)   Hunger Vital Sign    Worried About Running Out of Food in the Last Year: Never true    Ran Out of Food in the Last Year: Never true  Transportation Needs: No Transportation Needs (02/01/2023)   PRAPARE - Administrator, Civil Service (Medical): No    Lack of Transportation (Non-Medical): No  Physical Activity: Unknown (02/01/2023)   Exercise Vital Sign    Days of Exercise per Week: 0 days    Minutes of Exercise per Session: Not on file  Stress: Stress Concern Present (02/01/2023)   [employer] of Occupational Health - Occupational Stress Questionnaire    Feeling of Stress : Very much  Social Connections: Unknown (02/19/2023)   Received from [facility]   Social Network    Social Network: Not on file  Intimate Partner Violence: Unknown (02/19/2023)   Received from [facility]   HITS    Physically Hurt: Not on file    Insult or Talk Down To: Not on file    Threaten Physical Harm: Not on file    Scream or Curse: Not on file    Outpatient Encounter Medications as of 08/28/2023  Medication Sig   acetaZOLAMIDE (DIAMOX) 250 MG tablet Take 500 mg by mouth 2 (two) times daily.   albuterol (PROVENTIL) (2.5 MG/3ML) 0.083% nebulizer  solution Take 3 mLs (2.5 mg total) by nebulization every 4 (four) hours as needed for wheezing or shortness of breath (coughing fits).   atorvastatin (LIPITOR) 40 MG tablet Take 1 tablet (40 mg total) by mouth daily.   azelastine (ASTELIN) 0.1 % nasal spray Place 1 spray into both nostrils daily as needed for rhinitis. Use in each nostril as directed   Benralizumab (FASENRA PEN) 30 MG/ML SOAJ INJECT 1 PEN UNDER THE SKIN EVERY 8 WEEKS.   Budeson-Glycopyrrol-Formoterol (BREZTRI AEROSPHERE) 160-9-4.8 MCG/ACT AERO Inhale 2 puffs into the lungs in the morning and at  bedtime. with spacer and rinse mouth afterwards.   cromolyn (OPTICROM) 4 % ophthalmic solution Place 1 drop into both eyes 4 (four) times daily as needed (itchy/watery eyes).   famotidine (PEPCID) 20 MG tablet Take by mouth.   fluticasone (FLONASE) 50 MCG/ACT nasal spray Place 1 spray into both nostrils 2 (two) times daily as needed (nasal congestion).   levocetirizine (XYZAL) 5 MG tablet Take 1 tablet (5 mg total) by mouth every evening.   levonorgestrel (MIRENA, 52 MG,) 20 MCG/24HR IUD Mirena 20 mcg/24 hours (6 yrs) 52 mg intrauterine device  Take 1 device by intrauterine route.   predniSONE (DELTASONE) 20 MG tablet Take 2 tablets (40 mg total) by mouth daily with breakfast for 5 days.   tirzepatide (MOUNJARO) 15 MG/0.5ML Pen Inject 15 mg into the skin once a week.   VENTOLIN HFA 108 (90 Base) MCG/ACT inhaler Inhale 2 puffs into the lungs every 4 (four) hours as needed for wheezing or shortness of breath.   [DISCONTINUED] fenofibrate (TRICOR) 145 MG tablet Take 1 tablet (145 mg total) by mouth daily.   [DISCONTINUED] lisinopril-hydrochlorothiazide (ZESTORETIC) 20-25 MG tablet Take 1 tablet by mouth daily.   [DISCONTINUED] omeprazole (PRILOSEC) 20 MG capsule Take 1 capsule (20 mg total) by mouth daily.   [DISCONTINUED] VRAYLAR 4.5 MG CAPS Take 1 capsule (4.5 mg total) by mouth daily.   fenofibrate (TRICOR) 145 MG tablet Take 1 tablet (145  mg total) by mouth daily.   lisinopril-hydrochlorothiazide (ZESTORETIC) 20-25 MG tablet Take 1 tablet by mouth daily.   NIFEdipine (ADALAT CC) 60 MG 24 hr tablet Take 60 mg by mouth every morning.   omeprazole (PRILOSEC) 20 MG capsule Take 1 capsule (20 mg total) by mouth daily.   [DISCONTINUED] cetirizine (ZYRTEC) 10 MG tablet Take 10 mg by mouth daily.   No facility-administered encounter medications on file as of 08/28/2023.    Allergies  Allergen Reactions   Cats Claw (Uncaria Tomentosa) Itching, Shortness Of Breath and Swelling   Dust Mite Extract Itching and Shortness Of Breath   Grass Pollen(K-O-R-T-Swt Vern) Itching and Shortness Of Breath   Mixed Feathers Itching, Shortness Of Breath and Swelling   Uncaria Tomentosa (Cats Claw) Itching and Swelling   Gramineae Pollens Other (See Comments)   Lactose Other (See Comments)   Pollen Extract Other (See Comments)    Pertinent ROS per HPI, otherwise unremarkable      Objective:  BP (!) 150/87   Pulse 91   Temp 97.9 F (36.6 C)   Ht 5\' 6"  (1.676 m)   Wt 248 lb 6.4 oz (112.7 kg)   SpO2 100%   BMI 40.09 kg/m    Wt Readings from Last 3 Encounters:  08/28/23 248 lb 6.4 oz (112.7 kg)  07/25/23 245 lb (111.1 kg)  05/22/23 240 lb 3.2 oz (109 kg)    Physical Exam Vitals and nursing note reviewed.  Constitutional:      General: She is not in acute distress.    Appearance: Normal appearance. She is obese. She is not ill-appearing, toxic-appearing or diaphoretic.  HENT:     Head: Normocephalic and atraumatic.     Nose: Nose normal.     Mouth/Throat:     Mouth: Mucous membranes are moist.     Pharynx: Oropharynx is clear.  Eyes:     Conjunctiva/sclera: Conjunctivae normal.     Pupils: Pupils are equal, round, and reactive to light.  Cardiovascular:     Rate and Rhythm: Normal rate and regular rhythm.  Pulses:          Dorsalis pedis pulses are 2+ on the right side and 2+ on the left side.       Posterior tibial  pulses are 2+ on the right side and 2+ on the left side.     Heart sounds: Normal heart sounds.  Pulmonary:     Effort: Pulmonary effort is normal.     Breath sounds: Normal breath sounds.  Musculoskeletal:     Cervical back: Neck supple.     Right lower leg: No edema.     Left lower leg: No edema.  Feet:     Right foot:     Protective Sensation: 10 sites tested.  10 sites sensed.     Skin integrity: Skin integrity normal.     Toenail Condition: Right toenails are normal.     Left foot:     Protective Sensation: 10 sites tested.  10 sites sensed.     Skin integrity: Skin integrity normal.     Toenail Condition: Left toenails are normal.  Skin:    General: Skin is warm and dry.     Capillary Refill: Capillary refill takes less than 2 seconds.  Neurological:     General: No focal deficit present.     Mental Status: She is alert and oriented to person, place, and time.  Psychiatric:        Mood and Affect: Mood normal.        Behavior: Behavior normal.        Thought Content: Thought content normal.        Judgment: Judgment normal.      Results for orders placed or performed in visit on 08/28/23  Bayer DCA Hb A1c Waived   Collection Time: 08/28/23 11:20 AM  Result Value Ref Range   HB A1C (BAYER DCA - WAIVED) 4.8 4.8 - 5.6 %       Pertinent labs & imaging results that were available during my care of the patient were reviewed by me and considered in my medical decision making.  Assessment & Plan:  Sheletha was seen today for medical management of chronic issues.  Diagnoses and all orders for this visit:  Controlled type 2 diabetes mellitus without complication, without long-term current use of insulin (HCC) -     Lipid panel -     CBC with Differential/Platelet -     CMP14+EGFR -     Thyroid Panel With TSH -     Bayer DCA Hb A1c Waived -     Microalbumin / creatinine urine ratio  Hyperlipidemia associated with type 2 diabetes mellitus (HCC) -     Lipid panel -      CMP14+EGFR -     fenofibrate (TRICOR) 145 MG tablet; Take 1 tablet (145 mg total) by mouth daily.  Hypertension associated with type 2 diabetes mellitus (HCC) -     Lipid panel -     CBC with Differential/Platelet -     CMP14+EGFR -     Thyroid Panel With TSH -     lisinopril-hydrochlorothiazide (ZESTORETIC) 20-25 MG tablet; Take 1 tablet by mouth daily.  Gastroesophageal reflux disease without esophagitis -     CBC with Differential/Platelet -     omeprazole (PRILOSEC) 20 MG capsule; Take 1 capsule (20 mg total) by mouth daily.  Morbid obesity (HCC) -     Lipid panel -     CBC with Differential/Platelet -     CMP14+EGFR -  Thyroid Panel With TSH -     Bayer DCA Hb A1c Waived -     VITAMIN D 25 Hydroxy (Vit-D Deficiency, Fractures)  Vitamin D deficiency -     CMP14+EGFR -     VITAMIN D 25 Hydroxy (Vit-D Deficiency, Fractures)  Depression, recurrent (HCC) -     CMP14+EGFR -     Thyroid Panel With TSH -     VITAMIN D 25 Hydroxy (Vit-D Deficiency, Fractures)  GAD (generalized anxiety disorder) -     CMP14+EGFR -     Thyroid Panel With TSH -     VITAMIN D 25 Hydroxy (Vit-D Deficiency, Fractures)  Degeneration of intervertebral disc of lumbar region with discogenic back pain and lower extremity pain -     predniSONE (DELTASONE) 20 MG tablet; Take 2 tablets (40 mg total) by mouth daily with breakfast for 5 days.     Assessment and Plan    Degenerative Disc Disease Chronic back pain due to degenerative disc disease with swelling and pain localized to the back, recently extending to the side. No radicular symptoms. Referred to orthopedics for further evaluation and management. Physical therapy recommended for preservation of function and pain relief. A burst of prednisone is proposed for acute pain management, expected to provide significant relief. - Prescribe a burst of prednisone for acute pain management. - Encourage continuation of physical therapy. - Ensure  follow-up with orthopedic specialist on August 30, 2023.  Type 2 Diabetes Mellitus Type 2 diabetes mellitus managed with tirzepatide (Mounjaro) at 15 mg. Blood glucose levels are well-controlled. No major side effects reported.  Hypertension Hypertension managed with lisinopril. No side effects such as cough. Blood pressure is well-controlled.  Hyperlipidemia Hyperlipidemia managed with current medications. No issues with medication adherence or side effects.  Anxiety and Depression Previously on Vraylar, now likely on fluoxetine 60 mg (20 mg and 40 mg tablets). Reports no significant mood improvement and is unsure of current medication details. Further clarification needed on current medication regimen. - Confirm current medication and dosage for anxiety and depression upon returning home.  Gastroesophageal Reflux Disease (GERD) GERD well-controlled with omeprazole. No current symptoms reported.  General Health Maintenance Has not seen an eye doctor recently despite reporting headaches. No issues with peripheral neuropathy as sensation in feet is intact. - Encourage scheduling an eye examination. - Obtain urine sample for proteinuria screening. - Order blood work for comprehensive evaluation.          Continue all other maintenance medications.  Follow up plan: Return if symptoms worsen or fail to improve.   Continue healthy lifestyle choices, including diet (rich in fruits, vegetables, and lean proteins, and low in salt and simple carbohydrates) and exercise (at least 30 minutes of moderate physical activity daily).  Educational handout given for spondylolysis   The above assessment and management plan was discussed with the patient. The patient verbalized understanding of and has agreed to the management plan. Patient is aware to call the clinic if they develop any new symptoms or if symptoms persist or worsen. Patient is aware when to return to the clinic for a follow-up  visit. Patient educated on when it is appropriate to go to the emergency department.   Kari Baars, FNP-C [facility] [phone]

## 2023-08-29 ENCOUNTER — Encounter: Payer: Self-pay | Admitting: Family Medicine

## 2023-08-29 LAB — VITAMIN D 25 HYDROXY (VIT D DEFICIENCY, FRACTURES): Vit D, 25-Hydroxy: 21.6 ng/mL — ABNORMAL LOW (ref 30.0–100.0)

## 2023-08-29 LAB — LIPID PANEL
Chol/HDL Ratio: 4.8 ratio — ABNORMAL HIGH (ref 0.0–4.4)
Cholesterol, Total: 192 mg/dL (ref 100–199)
HDL: 40 mg/dL (ref >39)
LDL Chol Calc (NIH): 135 mg/dL — ABNORMAL HIGH (ref 0–99)
Triglycerides: 94 mg/dL (ref 0–149)
VLDL Cholesterol Cal: 17 mg/dL (ref 5–40)

## 2023-08-29 LAB — CBC WITH DIFFERENTIAL/PLATELET
Basophils Absolute: 0 10*3/uL (ref 0.0–0.2)
Basos: 0 %
EOS (ABSOLUTE): 0 10*3/uL (ref 0.0–0.4)
Eos: 0 %
Hematocrit: 44.9 % (ref 34.0–46.6)
Hemoglobin: 14.7 g/dL (ref 11.1–15.9)
Immature Grans (Abs): 0 10*3/uL (ref 0.0–0.1)
Immature Granulocytes: 0 %
Lymphocytes Absolute: 2.4 10*3/uL (ref 0.7–3.1)
Lymphs: 29 %
MCH: 28.3 pg (ref 26.6–33.0)
MCHC: 32.7 g/dL (ref 31.5–35.7)
MCV: 86 fL (ref 79–97)
Monocytes Absolute: 0.5 10*3/uL (ref 0.1–0.9)
Monocytes: 6 %
Neutrophils Absolute: 5.2 10*3/uL (ref 1.4–7.0)
Neutrophils: 65 %
Platelets: 265 10*3/uL (ref 150–450)
RBC: 5.2 x10E6/uL (ref 3.77–5.28)
RDW: 12 % (ref 11.7–15.4)
WBC: 8.1 10*3/uL (ref 3.4–10.8)

## 2023-08-29 LAB — CMP14+EGFR
ALT: 10 IU/L (ref 0–32)
AST: 14 IU/L (ref 0–40)
Albumin: 4.6 g/dL (ref 3.9–4.9)
Alkaline Phosphatase: 63 IU/L (ref 44–121)
BUN/Creatinine Ratio: 19 (ref 9–23)
BUN: 12 mg/dL (ref 6–20)
Bilirubin Total: 1.3 mg/dL — ABNORMAL HIGH (ref 0.0–1.2)
CO2: 23 mmol/L (ref 20–29)
Calcium: 9.3 mg/dL (ref 8.7–10.2)
Chloride: 101 mmol/L (ref 96–106)
Creatinine, Ser: 0.62 mg/dL (ref 0.57–1.00)
Globulin, Total: 2.3 g/dL (ref 1.5–4.5)
Glucose: 68 mg/dL — ABNORMAL LOW (ref 70–99)
Potassium: 4.1 mmol/L (ref 3.5–5.2)
Sodium: 142 mmol/L (ref 134–144)
Total Protein: 6.9 g/dL (ref 6.0–8.5)
eGFR: 122 mL/min/{1.73_m2} (ref >59)

## 2023-08-29 LAB — MICROALBUMIN / CREATININE URINE RATIO
Creatinine, Urine: 108.6 mg/dL
Microalb/Creat Ratio: 14 mg/g{creat} (ref 0–29)
Microalbumin, Urine: 14.7 ug/mL

## 2023-08-29 LAB — THYROID PANEL WITH TSH
Free Thyroxine Index: 3.3 (ref 1.2–4.9)
T3 Uptake Ratio: 32 % (ref 24–39)
T4, Total: 10.2 ug/dL (ref 4.5–12.0)
TSH: 1.54 u[IU]/mL (ref 0.450–4.500)

## 2023-09-03 ENCOUNTER — Encounter: Payer: Self-pay | Admitting: Orthopedic Surgery

## 2023-09-03 ENCOUNTER — Ambulatory Visit: Admitting: Orthopedic Surgery

## 2023-09-04 ENCOUNTER — Other Ambulatory Visit (INDEPENDENT_AMBULATORY_CARE_PROVIDER_SITE_OTHER): Payer: Self-pay

## 2023-09-04 ENCOUNTER — Ambulatory Visit: Admitting: Orthopedic Surgery

## 2023-09-04 VITALS — BP 141/96 | HR 81 | Ht 66.0 in | Wt 248.5 lb

## 2023-09-04 DIAGNOSIS — Z720 Tobacco use: Secondary | ICD-10-CM

## 2023-09-04 DIAGNOSIS — M545 Low back pain, unspecified: Secondary | ICD-10-CM | POA: Diagnosis not present

## 2023-09-04 MED ORDER — LIDOCAINE 5 % EX PTCH: 1.0000 | MEDICATED_PATCH | CUTANEOUS | 0 refills | Status: DC

## 2023-09-04 NOTE — Progress Notes (Signed)
 Orthopedic Spine Surgery Office Note  Assessment: Patient is a 33 y.o. female with low back pain. No radicular symptoms. Has a degenerative disc at L5/S1   Plan: -Explained that initially conservative treatment is tried as a significant number of patients may experience relief with these treatment modalities. Discussed that the conservative treatments include:  -activity modification  -physical therapy  -over the counter pain medications  -medrol dosepak  -lumbar steroid injections -Patient has tried tylenol, prednisone, methocarbamol  -Recommended lidocaine patches and PT. Referral provided to he today -Encouraged weight loss as well for her back pain and long-term health -Would need to be nicotine free prior to any elective spine surgery -Patient should return to office in 8 weeks, x-rays at next visit: none   I spent discussing smoking cessation with the patient. There are numerous health risks associated with nicotine use. There is some evidence of increased degenerative disc disease in smokers. Elective surgery would not be an option for her because of the increased risk of complication in smokers. Patient said she is working on quitting.   ___________________________________________________________________________   History:  Patient is a 33 y.o. female who presents today for lumbar spine.  Patient has had about 6 months of low back pain.  She notes it is worse if she is standing for too long.  She said she also has that if she sleeps on her left side.  She does not have any pain radiating into her lower extremities.  There was no trauma or injury that preceded the onset of pain.  She has tried several conservative treatments but has not noticed any relief with those.  She said pain has been getting worse over time.   Weakness: Denies Symptoms of imbalance: Denies Paresthesias and numbness: Denies Bowel or bladder incontinence: Denies Saddle anesthesia:  Denies  Treatments tried: tylenol, methocarbamol, prednisone  Review of systems: Denies fevers and chills, night sweats, unexplained weight loss, history of cancer.  Has had pain that wakes her at night  Past medical history: Depression/anxiety HTN HLD Migraines GERD  Allergies: NKDA  Past surgical history:  C-section L wrist surgery L ankle fracture open reduction internal fixation Gastric sleeve  Social history: Reports use of nicotine product (smoking, vaping, patches, smokeless) Alcohol use: denies Denies recreational drug use   Physical Exam:  BMI of 40.1  General: no acute distress, appears stated age Neurologic: alert, answering questions appropriately, following commands Respiratory: unlabored breathing on room air, symmetric chest rise Psychiatric: appropriate affect, normal cadence to speech   MSK (spine):  -Strength exam      Left  Right EHL    5/5  5/5 TA    5/5  5/5 GSC    5/5  5/5 Knee extension  5/5  5/5 Hip flexion   5/5  5/5  -Sensory exam    Sensation intact to light touch in L3-S1 nerve distributions of bilateral lower extremities  -Achilles DTR: 2/4 on the left, 2/4 on the right -Patellar tendon DTR: 2/4 on the left, 2/4 on the right  -Straight leg raise: negative bilaterally -Femoral nerve stretch test: negative bilaterally -Clonus: no beats bilaterally  -Left hip exam: no pain through range of motion, negative stinchfield, negative SI compression test, negative FABER -Right hip exam: no pain through range of motion, negative stinchfield, negative SI compression test, negative FABER  Imaging: XRs of the lumbar spine from 07/25/2023 were independently reviewed and interpreted, showing no coronal imbalance.  Disc height loss with anterior osteophyte formation at L5/S1.  No spondylolisthesis seen.  XRs of the lumbar spine from 09/03/2023 were independently reviewed and interpreted, showing disc height loss with anterior osteophyte  formation at L5/S1.  No evidence of instability on flexion/extension views.  No fracture or dislocation seen.   Patient name: Cassandra Tucker Patient MRN: [id number] Date of visit: 09/04/23

## 2023-09-05 ENCOUNTER — Other Ambulatory Visit: Payer: Self-pay | Admitting: Orthopedic Surgery

## 2023-09-06 ENCOUNTER — Encounter: Payer: Self-pay | Admitting: Orthopedic Surgery

## 2023-09-07 ENCOUNTER — Encounter: Payer: Self-pay | Admitting: Family Medicine

## 2023-09-10 ENCOUNTER — Ambulatory Visit: Attending: Orthopedic Surgery

## 2023-09-10 DIAGNOSIS — M62830 Muscle spasm of back: Secondary | ICD-10-CM | POA: Diagnosis not present

## 2023-09-10 DIAGNOSIS — M9901 Segmental and somatic dysfunction of cervical region: Secondary | ICD-10-CM | POA: Diagnosis not present

## 2023-09-10 DIAGNOSIS — M9903 Segmental and somatic dysfunction of lumbar region: Secondary | ICD-10-CM | POA: Diagnosis not present

## 2023-09-10 DIAGNOSIS — M545 Low back pain, unspecified: Secondary | ICD-10-CM | POA: Diagnosis not present

## 2023-09-10 DIAGNOSIS — M9902 Segmental and somatic dysfunction of thoracic region: Secondary | ICD-10-CM | POA: Diagnosis not present

## 2023-09-10 DIAGNOSIS — M5459 Other low back pain: Secondary | ICD-10-CM | POA: Insufficient documentation

## 2023-09-10 NOTE — Therapy (Signed)
 OUTPATIENT PHYSICAL THERAPY THORACOLUMBAR EVALUATION   Patient Name: Cassandra Tucker MRN: [id number] DOB:[birth year], 33 y.o., female Today's Date: 09/10/2023  END OF SESSION:  PT End of Session - 09/10/23 1347     Visit Number 1    Number of Visits 8    Date for PT Re-Evaluation 11/09/23    PT Start Time 1349    PT Stop Time 1425    PT Time Calculation (min) 36 min    Activity Tolerance Patient tolerated treatment well    Behavior During Therapy [facility] for tasks assessed/performed             Past Medical History:  Diagnosis Date   Ankle fracture, left 2000   Anxiety    Arthritis    left wrist and left ankle   Asthma    Back pain    Back pain    Bronchitis    Depression    Diabetes (HCC) 01/11/2016   type II  history of   Family history of adverse reaction to anesthesia    gmother had problems with N/V    GERD (gastroesophageal reflux disease)    Headache    High cholesterol    History of kidney stones    HLD (hyperlipidemia)    HSV-2 infection    Hypertension 2013   Intracranial hypertension    psuedotumor cebrum   Joint pain    Joint pain    Kienbock's disease    Leg edema    OSA (obstructive sleep apnea)    cpap - does not know settings    Papilledema    Prediabetes    Pregnancy induced hypertension    Pseudotumor cerebri syndrome    RLS (restless legs syndrome)    Sinus tachycardia    SOB (shortness of breath)    Past Surgical History:  Procedure Laterality Date   CESAREAN SECTION N/A 07/17/2019   Procedure: CESAREAN SECTION;  Surgeon: Osborn Coho, MD;  Location: [facility];  Service: Obstetrics;  Laterality: N/A;   FRACTURE SURGERY  2005   ankle   FRACTURE SURGERY  2013   wrist (deteriorating bone)   LAPAROSCOPIC GASTRIC SLEEVE RESECTION N/A 05/08/2017   Procedure: LAPAROSCOPIC GASTRIC SLEEVE RESECTION;  Surgeon: Berna Bue, MD;  Location: [facility];  Service: General;  Laterality: N/A;   LAPAROSCOPIC GASTRIC SLEEVE RESECTION  05/08/2017    UPPER GI ENDOSCOPY  05/08/2017   Procedure: UPPER GI ENDOSCOPY;  Surgeon: Berna Bue, MD;  Location: [facility] ORS;  Service: General;;   urethra stretch     WRIST SURGERY  2015   Patient Active Problem List   Diagnosis Date Noted   Urge incontinence of urine 05/22/2023   Cannabis abuse 08/08/2022   History of sleeve gastrectomy 08/08/2022   Back pain 04/11/2022   Panniculitis 04/11/2022   GAD (generalized anxiety disorder) 04/21/2021   Depression, recurrent (HCC) 04/21/2021   History of cesarean section 07/24/2020   History of gestational hypertension 07/24/2020   Severe persistent asthma without complication 02/19/2020   Seasonal and perennial allergic rhinoconjunctivitis 01/01/2020   BMI 45.0-49.9, adult (HCC) 06/29/2019   Anemia 05/21/2019   History of depression 01/28/2019   History of bariatric surgery 01/13/2019   Genital herpes simplex 01/13/2019   Vitamin D deficiency 02/26/2017   Class 3 obesity with serious comorbidity and body mass index (BMI) of 60.0 to 69.9 in adult 02/26/2017   Controlled type 2 diabetes mellitus without complication, without long-term current use of insulin (HCC) 04/21/2016  Gastroesophageal reflux disease without esophagitis 04/21/2016   Pseudotumor cerebri syndrome 11/29/2015   Super obesity 11/29/2015   OSA on CPAP 11/29/2015   RLS (restless legs syndrome) 11/29/2015   Papilledema 06/16/2015   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   Hyperlipidemia associated with type 2 diabetes mellitus (HCC) 01/21/2014   Sinus tachycardia 12/04/2011   Hypertension associated with type 2 diabetes mellitus (HCC) 12/04/2011   Morbid obesity (HCC) 11/07/2011    PCP: Sonny Masters, FNP  REFERRING PROVIDER: London Sheer, MD   REFERRING DIAG: Lumbar pain   Rationale for Evaluation and Treatment: Rehabilitation  THERAPY DIAG:  Other low back pain  ONSET DATE: 6 months  SUBJECTIVE:                                                                                                                                                                                            SUBJECTIVE STATEMENT: Patient reports that she has been having low back for about 6 months. She was just laying in bed when she began to feel a stabbing pain in her back with no known cause. Her pain is typically worse at the end of the day after working. She has noticed that her quality of her sleep has gotten worse as her pain can wake her up at night. She is typically only sleeping about 5-6 hours at night. Her pain starts in her right low back and then will radiate to the left side then up her back when she gets anxious.   PERTINENT HISTORY:  Hypertension, allergies, diabetes type 2, obesity, depression, anxiety, and current smoker  PAIN:  Are you having pain? Yes: NPRS scale: 3-4/10 Pain location: mid to low back Pain description: constant, dull pain Aggravating factors: sitting still, laying on her side (20 minutes at most, preferred position),  Relieving factors: laying on her back, topical creams, Prednisone, medication  PRECAUTIONS: None  RED FLAGS: None   WEIGHT BEARING RESTRICTIONS: No  FALLS:  Has patient fallen in last 6 months? No  LIVING ENVIRONMENT: Lives with: lives with their daughter Lives in: House/apartment Stairs: Yes: External: 2 steps; on right going up Has following equipment at home: None  OCCUPATION: works from home  PLOF: Independent  PATIENT GOALS: reduced pain, sit and stand longer (approximately 30 minutes currently), lifting her daughter  NEXT MD VISIT: 10/29/23  OBJECTIVE:  Note: Objective measures were completed at Evaluation unless otherwise noted.  DIAGNOSTIC FINDINGS:  XRs of the lumbar spine from 09/03/2023 were independently reviewed and interpreted, showing disc height loss with anterior osteophyte formation at L5/S1.  No evidence of instability on flexion/extension views.  No fracture or dislocation seen.    PATIENT SURVEYS:  Modified Oswestry 34% disability   COGNITION: Overall cognitive status: Within functional limits for tasks assessed     SENSATION: Patient reports no numbness or tingling  PALPATION: TTP: bilateral lumbar paraspinals, QL, and right gluteals Increased tenderness to palpation along right lumbar musculature  LUMBAR JOINT MOBILITY:  L1-2: hypomobile and familiar pain  L2-5: WFL and nonpainful  L5-S1: hypomobile and familiar pain  LUMBAR ROM:   AROM eval  Flexion WFL; "pulling"   Extension 14  Right lateral flexion WFL; "pulling in mid back'   Left lateral flexion WFL   Right rotation 50% limited  Left rotation 25% limited   (Blank rows = not tested)  LOWER EXTREMITY ROM: WFL for activities  LOWER EXTREMITY MMT:    MMT Right eval Left eval  Hip flexion 4/5 4/5  Hip extension    Hip abduction    Hip adduction    Hip internal rotation    Hip external rotation    Knee flexion 5/5 5/5  Knee extension 4/5 4/5  Ankle dorsiflexion    Ankle plantarflexion    Ankle inversion    Ankle eversion     (Blank rows = not tested)  GAIT: Assistive device utilized: None Level of assistance: Complete Independence Comments: no significant gait deviations observed  TREATMENT DATE:                                                                                                                                  PATIENT EDUCATION:  Education details: POC, healing, objective findings, x-ray results, and goals for physical therapy Person educated: Patient Education method: Explanation Education comprehension: verbalized understanding  HOME EXERCISE PROGRAM:   ASSESSMENT:  CLINICAL IMPRESSION: Patient is a 33 y.o. female who was seen today for physical therapy evaluation and treatment for chronic low back pain. She presented with low to moderate pain severity and irritability with palpation to her lumbar musculature and lumbar joint mobility assessments  reproducing her familiar symptoms. Recommend that she continue with skilled physical therapy to address her impairments to return to her prior level of function.  OBJECTIVE IMPAIRMENTS: decreased activity tolerance, decreased mobility, difficulty walking, decreased ROM, decreased strength, hypomobility, impaired tone, and pain.   ACTIVITY LIMITATIONS: lifting, bending, sitting, standing, sleeping, and locomotion level  PARTICIPATION LIMITATIONS: meal prep, cleaning, laundry, and community activity  PERSONAL FACTORS: Time since onset of injury/illness/exacerbation and 3+ comorbidities: Hypertension, allergies, diabetes type 2, obesity, depression, anxiety, and current smoker  are also affecting patient's functional outcome.   REHAB POTENTIAL: Good  CLINICAL DECISION MAKING: Evolving/moderate complexity  EVALUATION COMPLEXITY: Moderate   GOALS: Goals reviewed with patient? Yes  LONG TERM GOALS: Target date: 10/08/23  Patient will be independent with her HEP.  Baseline:  Goal status: INITIAL  2.  Patient will be able pick up her daughter without her familiar pain exceeding 2/10.  Baseline:  Goal  status: INITIAL  3.  Patient will report being able to sleep at least 7 hours without being awakened by her familiar symptoms.  Baseline:  Goal status: INITIAL  4.  Patient will improved her ODI score to 24% disability or less for improved perceived function with her daily activities.  Baseline:  Goal status: INITIAL  PLAN:  PT FREQUENCY: 2x/week  PT DURATION: 4 weeks  PLANNED INTERVENTIONS: 97164- PT Re-evaluation, 97110-Therapeutic exercises, 97530- Therapeutic activity, 97112- Neuromuscular re-education, 97535- Self Care, 40981- Manual therapy, Patient/Family education, Balance training, Joint mobilization, and Spinal mobilization.  PLAN FOR NEXT SESSION: Nustep, lumbar and lower extremity strengthening, and manual therapy   Granville Lewis, PT 09/10/2023, 4:45 PM

## 2023-09-11 DIAGNOSIS — M9902 Segmental and somatic dysfunction of thoracic region: Secondary | ICD-10-CM | POA: Diagnosis not present

## 2023-09-11 DIAGNOSIS — M9901 Segmental and somatic dysfunction of cervical region: Secondary | ICD-10-CM | POA: Diagnosis not present

## 2023-09-11 DIAGNOSIS — M62830 Muscle spasm of back: Secondary | ICD-10-CM | POA: Diagnosis not present

## 2023-09-11 DIAGNOSIS — M9903 Segmental and somatic dysfunction of lumbar region: Secondary | ICD-10-CM | POA: Diagnosis not present

## 2023-09-12 DIAGNOSIS — M9902 Segmental and somatic dysfunction of thoracic region: Secondary | ICD-10-CM | POA: Diagnosis not present

## 2023-09-12 DIAGNOSIS — M9903 Segmental and somatic dysfunction of lumbar region: Secondary | ICD-10-CM | POA: Diagnosis not present

## 2023-09-12 DIAGNOSIS — M9901 Segmental and somatic dysfunction of cervical region: Secondary | ICD-10-CM | POA: Diagnosis not present

## 2023-09-12 DIAGNOSIS — M62830 Muscle spasm of back: Secondary | ICD-10-CM | POA: Diagnosis not present

## 2023-09-13 ENCOUNTER — Other Ambulatory Visit: Payer: Self-pay | Admitting: Family Medicine

## 2023-09-13 DIAGNOSIS — M9901 Segmental and somatic dysfunction of cervical region: Secondary | ICD-10-CM | POA: Diagnosis not present

## 2023-09-13 DIAGNOSIS — M62830 Muscle spasm of back: Secondary | ICD-10-CM | POA: Diagnosis not present

## 2023-09-13 DIAGNOSIS — M9902 Segmental and somatic dysfunction of thoracic region: Secondary | ICD-10-CM | POA: Diagnosis not present

## 2023-09-13 DIAGNOSIS — E119 Type 2 diabetes mellitus without complications: Secondary | ICD-10-CM

## 2023-09-13 DIAGNOSIS — M9903 Segmental and somatic dysfunction of lumbar region: Secondary | ICD-10-CM | POA: Diagnosis not present

## 2023-09-14 ENCOUNTER — Ambulatory Visit (HOSPITAL_BASED_OUTPATIENT_CLINIC_OR_DEPARTMENT_OTHER)

## 2023-09-18 ENCOUNTER — Ambulatory Visit: Attending: Orthopedic Surgery

## 2023-09-18 DIAGNOSIS — M9903 Segmental and somatic dysfunction of lumbar region: Secondary | ICD-10-CM | POA: Diagnosis not present

## 2023-09-18 DIAGNOSIS — M5459 Other low back pain: Secondary | ICD-10-CM | POA: Diagnosis not present

## 2023-09-18 DIAGNOSIS — R293 Abnormal posture: Secondary | ICD-10-CM | POA: Diagnosis not present

## 2023-09-18 DIAGNOSIS — M62830 Muscle spasm of back: Secondary | ICD-10-CM | POA: Diagnosis not present

## 2023-09-18 DIAGNOSIS — M9902 Segmental and somatic dysfunction of thoracic region: Secondary | ICD-10-CM | POA: Diagnosis not present

## 2023-09-18 DIAGNOSIS — M545 Low back pain, unspecified: Secondary | ICD-10-CM | POA: Insufficient documentation

## 2023-09-18 DIAGNOSIS — M9901 Segmental and somatic dysfunction of cervical region: Secondary | ICD-10-CM | POA: Diagnosis not present

## 2023-09-18 DIAGNOSIS — G8929 Other chronic pain: Secondary | ICD-10-CM | POA: Diagnosis not present

## 2023-09-18 NOTE — Therapy (Signed)
 OUTPATIENT PHYSICAL THERAPY THORACOLUMBAR TREATMENT   Patient Name: Cassandra Tucker MRN: [id number] DOB:1990/11/14, 33 y.o., female Today's Date: 09/18/2023  END OF SESSION:  PT End of Session - 09/18/23 0937     Visit Number 2    Number of Visits 8    Date for PT Re-Evaluation 11/09/23    PT Start Time 0929    PT Stop Time 1015    PT Time Calculation (min) 46 min    Activity Tolerance Patient tolerated treatment well    Behavior During Therapy [facility] for tasks assessed/performed              Past Medical History:  Diagnosis Date   Ankle fracture, left 2000   Anxiety    Arthritis    left wrist and left ankle   Asthma    Back pain    Back pain    Bronchitis    Depression    Diabetes (HCC) 01/11/2016   type II  history of   Family history of adverse reaction to anesthesia    gmother had problems with N/V    GERD (gastroesophageal reflux disease)    Headache    High cholesterol    History of kidney stones    HLD (hyperlipidemia)    HSV-2 infection    Hypertension 2013   Intracranial hypertension    psuedotumor cebrum   Joint pain    Joint pain    Kienbock's disease    Leg edema    OSA (obstructive sleep apnea)    cpap - does not know settings    Papilledema    Prediabetes    Pregnancy induced hypertension    Pseudotumor cerebri syndrome    RLS (restless legs syndrome)    Sinus tachycardia    SOB (shortness of breath)    Past Surgical History:  Procedure Laterality Date   CESAREAN SECTION N/A 07/17/2019   Procedure: CESAREAN SECTION;  Surgeon: Osborn Coho, MD;  Location: [facility];  Service: Obstetrics;  Laterality: N/A;   FRACTURE SURGERY  2005   ankle   FRACTURE SURGERY  2013   wrist (deteriorating bone)   LAPAROSCOPIC GASTRIC SLEEVE RESECTION N/A 05/08/2017   Procedure: LAPAROSCOPIC GASTRIC SLEEVE RESECTION;  Surgeon: Berna Bue, MD;  Location: [facility];  Service: General;  Laterality: N/A;   LAPAROSCOPIC GASTRIC SLEEVE RESECTION  05/08/2017    UPPER GI ENDOSCOPY  05/08/2017   Procedure: UPPER GI ENDOSCOPY;  Surgeon: Berna Bue, MD;  Location: [facility];  Service: General;;   urethra stretch     WRIST SURGERY  2015   Patient Active Problem List   Diagnosis Date Noted   Urge incontinence of urine 05/22/2023   Cannabis abuse 08/08/2022   History of sleeve gastrectomy 08/08/2022   Back pain 04/11/2022   Panniculitis 04/11/2022   GAD (generalized anxiety disorder) 04/21/2021   Depression, recurrent (HCC) 04/21/2021   History of cesarean section 07/24/2020   History of gestational hypertension 07/24/2020   Severe persistent asthma without complication 02/19/2020   Seasonal and perennial allergic rhinoconjunctivitis 01/01/2020   BMI 45.0-49.9, adult (HCC) 06/29/2019   Anemia 05/21/2019   History of depression 01/28/2019   History of bariatric surgery 01/13/2019   Genital herpes simplex 01/13/2019   Vitamin D deficiency 02/26/2017   Class 3 obesity with serious comorbidity and body mass index (BMI) of 60.0 to 69.9 in adult 02/26/2017   Controlled type 2 diabetes mellitus without complication, without long-term current use of insulin (HCC) 04/21/2016  Gastroesophageal reflux disease without esophagitis 04/21/2016   Pseudotumor cerebri syndrome 11/29/2015   Super obesity 11/29/2015   OSA on CPAP 11/29/2015   RLS (restless legs syndrome) 11/29/2015   Papilledema 06/16/2015   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   Hyperlipidemia associated with type 2 diabetes mellitus (HCC) 01/21/2014   Sinus tachycardia 12/04/2011   Hypertension associated with type 2 diabetes mellitus (HCC) 12/04/2011   Morbid obesity (HCC) 11/07/2011    PCP: Sonny Masters, FNP  REFERRING PROVIDER: London Sheer, MD   REFERRING DIAG: Lumbar pain   Rationale for Evaluation and Treatment: Rehabilitation  THERAPY DIAG:  Other low back pain  ONSET DATE: 6 months  SUBJECTIVE:                                                                                                                                                                                            SUBJECTIVE STATEMENT: Patient reports that she is not hurting today. However, she was hurting Saturday.   PERTINENT HISTORY:  Hypertension, allergies, diabetes type 2, obesity, depression, anxiety, and current smoker  PAIN:  Are you having pain? Yes: NPRS scale: 0/10 Pain location: mid to low back Pain description: constant, dull pain Aggravating factors: sitting still, laying on her side (20 minutes at most, preferred position),  Relieving factors: laying on her back, topical creams, Prednisone, medication  PRECAUTIONS: None  RED FLAGS: None   WEIGHT BEARING RESTRICTIONS: No  FALLS:  Has patient fallen in last 6 months? No  LIVING ENVIRONMENT: Lives with: lives with their daughter Lives in: House/apartment Stairs: Yes: External: 2 steps; on right going up Has following equipment at home: None  OCCUPATION: works from home  PLOF: Independent  PATIENT GOALS: reduced pain, sit and stand longer (approximately 30 minutes currently), lifting her daughter  NEXT MD VISIT: 10/29/23  OBJECTIVE:  Note: Objective measures were completed at Evaluation unless otherwise noted.  DIAGNOSTIC FINDINGS:  XRs of the lumbar spine from 09/03/2023 were independently reviewed and interpreted, showing disc height loss with anterior osteophyte formation at L5/S1.  No evidence of instability on flexion/extension views.  No fracture or dislocation seen.   PATIENT SURVEYS:  Modified Oswestry 34% disability   COGNITION: Overall cognitive status: Within functional limits for tasks assessed     SENSATION: Patient reports no numbness or tingling  PALPATION: TTP: bilateral lumbar paraspinals, QL, and right gluteals Increased tenderness to palpation along right lumbar musculature  LUMBAR JOINT MOBILITY:  L1-2: hypomobile and familiar pain  L2-5: WFL and nonpainful  L5-S1:  hypomobile and familiar pain  LUMBAR ROM:   AROM eval  Flexion WFL; "pulling"  Extension 14  Right lateral flexion WFL; "pulling in mid back'   Left lateral flexion WFL   Right rotation 50% limited  Left rotation 25% limited   (Blank rows = not tested)  LOWER EXTREMITY ROM: WFL for activities  LOWER EXTREMITY MMT:    MMT Right eval Left eval  Hip flexion 4/5 4/5  Hip extension    Hip abduction    Hip adduction    Hip internal rotation    Hip external rotation    Knee flexion 5/5 5/5  Knee extension 4/5 4/5  Ankle dorsiflexion    Ankle plantarflexion    Ankle inversion    Ankle eversion     (Blank rows = not tested)  GAIT: Assistive device utilized: None Level of assistance: Complete Independence Comments: no significant gait deviations observed  TREATMENT DATE:                                                                                                                                                                 09/18/23 EXERCISE LOG  Exercise Repetitions and Resistance Comments  Nustep  L4 x 14 minutes   Resisted pull down  Blue XTS x 3 minutes   Seated dead lift 8#  x 2 x 15 reps   LAQ 3# x 3 x 12 reps each     Standing HS curl  5# x 3 x 10 reps each    Standing hip ABD  3 x 12 reps each    Horizontal ABD  Green t-band x 2 x 12 reps     Blank cell = exercise not performed today    PATIENT EDUCATION:  Education details: POC, healing, objective findings, x-ray results, and goals for physical therapy Person educated: Patient Education method: Explanation Education comprehension: verbalized understanding  HOME EXERCISE PROGRAM:   ASSESSMENT:  CLINICAL IMPRESSION: Patient was introduced to multiple new interventions for improved lumbar and lower extremity stability. She required minimal cueing with today's new interventions for proper exercise performance. She experienced no increase in pain or discomfort with any of today's interventions. She  reported feeling good upon the conclusion of treatment. She continues to require skilled physical therapy to address her remaining impairments to return to her prior level of function.   OBJECTIVE IMPAIRMENTS: decreased activity tolerance, decreased mobility, difficulty walking, decreased ROM, decreased strength, hypomobility, impaired tone, and pain.   ACTIVITY LIMITATIONS: lifting, bending, sitting, standing, sleeping, and locomotion level  PARTICIPATION LIMITATIONS: meal prep, cleaning, laundry, and community activity  PERSONAL FACTORS: Time since onset of injury/illness/exacerbation and 3+ comorbidities: Hypertension, allergies, diabetes type 2, obesity, depression, anxiety, and current smoker  are also affecting patient's functional outcome.   REHAB POTENTIAL: Good  CLINICAL DECISION MAKING: Evolving/moderate complexity  EVALUATION COMPLEXITY: Moderate   GOALS:  Goals reviewed with patient? Yes  LONG TERM GOALS: Target date: 10/08/23  Patient will be independent with her HEP.  Baseline:  Goal status: INITIAL  2.  Patient will be able pick up her daughter without her familiar pain exceeding 2/10.  Baseline:  Goal status: INITIAL  3.  Patient will report being able to sleep at least 7 hours without being awakened by her familiar symptoms.  Baseline:  Goal status: INITIAL  4.  Patient will improved her ODI score to 24% disability or less for improved perceived function with her daily activities.  Baseline:  Goal status: INITIAL  PLAN:  PT FREQUENCY: 2x/week  PT DURATION: 4 weeks  PLANNED INTERVENTIONS: 97164- PT Re-evaluation, 97110-Therapeutic exercises, 97530- Therapeutic activity, 97112- Neuromuscular re-education, 97535- Self Care, 09811- Manual therapy, Patient/Family education, Balance training, Joint mobilization, and Spinal mobilization.  PLAN FOR NEXT SESSION: Nustep, lumbar and lower extremity strengthening, and manual therapy   Granville Lewis,  PT 09/18/2023, 10:37 AM

## 2023-09-19 DIAGNOSIS — M9901 Segmental and somatic dysfunction of cervical region: Secondary | ICD-10-CM | POA: Diagnosis not present

## 2023-09-19 DIAGNOSIS — M9903 Segmental and somatic dysfunction of lumbar region: Secondary | ICD-10-CM | POA: Diagnosis not present

## 2023-09-19 DIAGNOSIS — M9902 Segmental and somatic dysfunction of thoracic region: Secondary | ICD-10-CM | POA: Diagnosis not present

## 2023-09-19 DIAGNOSIS — M62830 Muscle spasm of back: Secondary | ICD-10-CM | POA: Diagnosis not present

## 2023-09-20 DIAGNOSIS — M9902 Segmental and somatic dysfunction of thoracic region: Secondary | ICD-10-CM | POA: Diagnosis not present

## 2023-09-20 DIAGNOSIS — M9901 Segmental and somatic dysfunction of cervical region: Secondary | ICD-10-CM | POA: Diagnosis not present

## 2023-09-20 DIAGNOSIS — M62830 Muscle spasm of back: Secondary | ICD-10-CM | POA: Diagnosis not present

## 2023-09-20 DIAGNOSIS — M9903 Segmental and somatic dysfunction of lumbar region: Secondary | ICD-10-CM | POA: Diagnosis not present

## 2023-09-25 ENCOUNTER — Ambulatory Visit

## 2023-09-25 DIAGNOSIS — R293 Abnormal posture: Secondary | ICD-10-CM | POA: Diagnosis not present

## 2023-09-25 DIAGNOSIS — M5459 Other low back pain: Secondary | ICD-10-CM

## 2023-09-25 DIAGNOSIS — M9901 Segmental and somatic dysfunction of cervical region: Secondary | ICD-10-CM | POA: Diagnosis not present

## 2023-09-25 DIAGNOSIS — M545 Low back pain, unspecified: Secondary | ICD-10-CM | POA: Diagnosis not present

## 2023-09-25 DIAGNOSIS — M62830 Muscle spasm of back: Secondary | ICD-10-CM | POA: Diagnosis not present

## 2023-09-25 DIAGNOSIS — M9903 Segmental and somatic dysfunction of lumbar region: Secondary | ICD-10-CM | POA: Diagnosis not present

## 2023-09-25 DIAGNOSIS — G8929 Other chronic pain: Secondary | ICD-10-CM | POA: Diagnosis not present

## 2023-09-25 DIAGNOSIS — M9902 Segmental and somatic dysfunction of thoracic region: Secondary | ICD-10-CM | POA: Diagnosis not present

## 2023-09-25 NOTE — Therapy (Signed)
 OUTPATIENT PHYSICAL THERAPY THORACOLUMBAR TREATMENT   Patient Name: Cassandra Tucker MRN: [id number] DOB:1991-02-02, 33 y.o., female Today's Date: 09/25/2023  END OF SESSION:  PT End of Session - 09/25/23 0850     Visit Number 3    Number of Visits 8    Date for PT Re-Evaluation 11/09/23    Authorization Type [employer]    Authorization Time Period 09/12/23-11/10/23    Authorization - Number of Visits 6     PT Start Time 0845    PT Stop Time 0930    PT Time Calculation (min) 45 min    Activity Tolerance Patient tolerated treatment well    Behavior During Therapy [facility] for tasks assessed/performed              Past Medical History:  Diagnosis Date   Ankle fracture, left 2000   Anxiety    Arthritis    left wrist and left ankle   Asthma    Back pain    Back pain    Bronchitis    Depression    Diabetes (HCC) 01/11/2016   type II  history of   Family history of adverse reaction to anesthesia    gmother had problems with N/V    GERD (gastroesophageal reflux disease)    Headache    High cholesterol    History of kidney stones    HLD (hyperlipidemia)    HSV-2 infection    Hypertension 2013   Intracranial hypertension    psuedotumor cebrum   Joint pain    Joint pain    Kienbock's disease    Leg edema    OSA (obstructive sleep apnea)    cpap - does not know settings    Papilledema    Prediabetes    Pregnancy induced hypertension    Pseudotumor cerebri syndrome    RLS (restless legs syndrome)    Sinus tachycardia    SOB (shortness of breath)    Past Surgical History:  Procedure Laterality Date   CESAREAN SECTION N/A 07/17/2019   Procedure: CESAREAN SECTION;  Surgeon: Renea Carrion, MD;  Location: [facility] ORS;  Service: Obstetrics;  Laterality: N/A;   FRACTURE SURGERY  2005   ankle   FRACTURE SURGERY  2013   wrist (deteriorating bone)   LAPAROSCOPIC GASTRIC SLEEVE RESECTION N/A 05/08/2017   Procedure: LAPAROSCOPIC GASTRIC SLEEVE RESECTION;  Surgeon: Adalberto Acton, MD;  Location: [facility] ORS;  Service: General;  Laterality: N/A;   LAPAROSCOPIC GASTRIC SLEEVE RESECTION  05/08/2017   UPPER GI ENDOSCOPY  05/08/2017   Procedure: UPPER GI ENDOSCOPY;  Surgeon: Adalberto Acton, MD;  Location: [facility] ORS;  Service: General;;   urethra stretch     WRIST SURGERY  2015   Patient Active Problem List   Diagnosis Date Noted   Urge incontinence of urine 05/22/2023   Cannabis abuse 08/08/2022   History of sleeve gastrectomy 08/08/2022   Back pain 04/11/2022   Panniculitis 04/11/2022   GAD (generalized anxiety disorder) 04/21/2021   Depression, recurrent (HCC) 04/21/2021   History of cesarean section 07/24/2020   History of gestational hypertension 07/24/2020   Severe persistent asthma without complication 02/19/2020   Seasonal and perennial allergic rhinoconjunctivitis 01/01/2020   BMI 45.0-49.9, adult (HCC) 06/29/2019   Anemia 05/21/2019   History of depression 01/28/2019   History of bariatric surgery 01/13/2019   Genital herpes simplex 01/13/2019   Vitamin D deficiency 02/26/2017   Class 3 obesity with serious comorbidity and body mass index (BMI) of  60.0 to 69.9 in adult 02/26/2017   Controlled type 2 diabetes mellitus without complication, without long-term current use of insulin (HCC) 04/21/2016   Gastroesophageal reflux disease without esophagitis 04/21/2016   Pseudotumor cerebri syndrome 11/29/2015   Super obesity 11/29/2015   OSA on CPAP 11/29/2015   RLS (restless legs syndrome) 11/29/2015   Papilledema 06/16/2015   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   Hyperlipidemia associated with type 2 diabetes mellitus (HCC) 01/21/2014   Sinus tachycardia 12/04/2011   Hypertension associated with type 2 diabetes mellitus (HCC) 12/04/2011   Morbid obesity (HCC) 11/07/2011    PCP: Galvin Jules, FNP  REFERRING PROVIDER: Diedra Fowler, MD   REFERRING DIAG: Lumbar pain   Rationale for Evaluation and Treatment: Rehabilitation  THERAPY DIAG:   Other low back pain  Chronic bilateral low back pain without sciatica  Abnormal posture  ONSET DATE: 6 months  SUBJECTIVE:                                                                                                                                                                                           SUBJECTIVE STATEMENT: Pt reports 4/10 right low back pain today.  Pt reports that pain got up to 6/10 last night.   PERTINENT HISTORY:  Hypertension, allergies, diabetes type 2, obesity, depression, anxiety, and current smoker  PAIN:  Are you having pain? Yes: NPRS scale: 4/10 Pain location: mid to low back Pain description: constant, dull pain Aggravating factors: sitting still, laying on her side (20 minutes at most, preferred position),  Relieving factors: laying on her back, topical creams, Prednisone, medication  PRECAUTIONS: None  RED FLAGS: None   WEIGHT BEARING RESTRICTIONS: No  FALLS:  Has patient fallen in last 6 months? No  LIVING ENVIRONMENT: Lives with: lives with their daughter Lives in: House/apartment Stairs: Yes: External: 2 steps; on right going up Has following equipment at home: None  OCCUPATION: works from home  PLOF: Independent  PATIENT GOALS: reduced pain, sit and stand longer (approximately 30 minutes currently), lifting her daughter  NEXT MD VISIT: 10/29/23  OBJECTIVE:  Note: Objective measures were completed at Evaluation unless otherwise noted.  DIAGNOSTIC FINDINGS:  XRs of the lumbar spine from 09/03/2023 were independently reviewed and interpreted, showing disc height loss with anterior osteophyte formation at L5/S1.  No evidence of instability on flexion/extension views.  No fracture or dislocation seen.   PATIENT SURVEYS:  Modified Oswestry 34% disability   COGNITION: Overall cognitive status: Within functional limits for tasks assessed     SENSATION: Patient reports no numbness or tingling  PALPATION: TTP: bilateral  lumbar paraspinals, QL, and right  gluteals Increased tenderness to palpation along right lumbar musculature  LUMBAR JOINT MOBILITY:  L1-2: hypomobile and familiar pain  L2-5: WFL and nonpainful  L5-S1: hypomobile and familiar pain  LUMBAR ROM:   AROM eval  Flexion WFL; "pulling"   Extension 14  Right lateral flexion WFL; "pulling in mid back'   Left lateral flexion WFL   Right rotation 50% limited  Left rotation 25% limited   (Blank rows = not tested)  LOWER EXTREMITY ROM: WFL for activities  LOWER EXTREMITY MMT:    MMT Right eval Left eval  Hip flexion 4/5 4/5  Hip extension    Hip abduction    Hip adduction    Hip internal rotation    Hip external rotation    Knee flexion 5/5 5/5  Knee extension 4/5 4/5  Ankle dorsiflexion    Ankle plantarflexion    Ankle inversion    Ankle eversion     (Blank rows = not tested)  GAIT: Assistive device utilized: None Level of assistance: Complete Independence Comments: no significant gait deviations observed  TREATMENT DATE:                                                                                                                                09/25/23    EXERCISE LOG  Exercise Repetitions and Resistance Comments  Nustep  L4 x 14 minutes   Rockerboard 4 mins   Resisted pull down  Blue x 3 minutes   Seated dead lift    LAQ 4# x 25 reps bil   Standing HS curl  5# x 3 x 10 reps each    Standing hip ABD  2# 2 x 10 reps each   Standing hip Extension 2# 2 x 10 reps each   Horizontal ABD  Green t-band x 2 x 15 reps     Blank cell = exercise not performed today    PATIENT EDUCATION:  Education details: POC, healing, objective findings, x-ray results, and goals for physical therapy Person educated: Patient Education method: Explanation Education comprehension: verbalized understanding  HOME EXERCISE PROGRAM:   ASSESSMENT:  CLINICAL IMPRESSION: Pt arrives for today's treatment session reporting 4/10 right low  back pain.  Pt able to tolerate resisted standing hip exercises today with good results.  Pt requiring min cues for proper technique and posture.  Pt denied any change in pain at completion of today's treatment session.    OBJECTIVE IMPAIRMENTS: decreased activity tolerance, decreased mobility, difficulty walking, decreased ROM, decreased strength, hypomobility, impaired tone, and pain.   ACTIVITY LIMITATIONS: lifting, bending, sitting, standing, sleeping, and locomotion level  PARTICIPATION LIMITATIONS: meal prep, cleaning, laundry, and community activity  PERSONAL FACTORS: Time since onset of injury/illness/exacerbation and 3+ comorbidities: Hypertension, allergies, diabetes type 2, obesity, depression, anxiety, and current smoker  are also affecting patient's functional outcome.   REHAB POTENTIAL: Good  CLINICAL DECISION MAKING: Evolving/moderate complexity  EVALUATION COMPLEXITY: Moderate   GOALS: Goals  reviewed with patient? Yes  LONG TERM GOALS: Target date: 10/08/23  Patient will be independent with her HEP.  Baseline:  Goal status: INITIAL  2.  Patient will be able pick up her daughter without her familiar pain exceeding 2/10.  Baseline:  Goal status: INITIAL  3.  Patient will report being able to sleep at least 7 hours without being awakened by her familiar symptoms.  Baseline:  Goal status: INITIAL  4.  Patient will improved her ODI score to 24% disability or less for improved perceived function with her daily activities.  Baseline:  Goal status: INITIAL  PLAN:  PT FREQUENCY: 2x/week  PT DURATION: 4 weeks  PLANNED INTERVENTIONS: 97164- PT Re-evaluation, 97110-Therapeutic exercises, 97530- Therapeutic activity, 97112- Neuromuscular re-education, 97535- Self Care, 16109- Manual therapy, Patient/Family education, Balance training, Joint mobilization, and Spinal mobilization.  PLAN FOR NEXT SESSION: Nustep, lumbar and lower extremity strengthening, and manual  therapy   Deryl Flora, PTA 09/25/2023, 10:24 AM

## 2023-09-26 DIAGNOSIS — M9901 Segmental and somatic dysfunction of cervical region: Secondary | ICD-10-CM | POA: Diagnosis not present

## 2023-09-26 DIAGNOSIS — M9902 Segmental and somatic dysfunction of thoracic region: Secondary | ICD-10-CM | POA: Diagnosis not present

## 2023-09-26 DIAGNOSIS — M62830 Muscle spasm of back: Secondary | ICD-10-CM | POA: Diagnosis not present

## 2023-09-26 DIAGNOSIS — M9903 Segmental and somatic dysfunction of lumbar region: Secondary | ICD-10-CM | POA: Diagnosis not present

## 2023-09-27 ENCOUNTER — Encounter: Payer: Self-pay | Admitting: Family Medicine

## 2023-09-27 ENCOUNTER — Ambulatory Visit (INDEPENDENT_AMBULATORY_CARE_PROVIDER_SITE_OTHER): Admitting: Family Medicine

## 2023-09-27 VITALS — BP 134/84 | HR 89 | Temp 97.8°F | Ht 66.0 in | Wt 249.4 lb

## 2023-09-27 DIAGNOSIS — M9903 Segmental and somatic dysfunction of lumbar region: Secondary | ICD-10-CM | POA: Diagnosis not present

## 2023-09-27 DIAGNOSIS — I7300 Raynaud's syndrome without gangrene: Secondary | ICD-10-CM

## 2023-09-27 DIAGNOSIS — M9902 Segmental and somatic dysfunction of thoracic region: Secondary | ICD-10-CM | POA: Diagnosis not present

## 2023-09-27 DIAGNOSIS — M9901 Segmental and somatic dysfunction of cervical region: Secondary | ICD-10-CM | POA: Diagnosis not present

## 2023-09-27 DIAGNOSIS — M62830 Muscle spasm of back: Secondary | ICD-10-CM | POA: Diagnosis not present

## 2023-09-27 MED ORDER — AMLODIPINE BESYLATE 5 MG PO TABS: 5.0000 mg | ORAL_TABLET | Freq: Every day | ORAL | 1 refills | Status: AC

## 2023-09-27 NOTE — Progress Notes (Signed)
 Subjective:  Patient ID: Cassandra Tucker, female    DOB: 10-23-1990, 33 y.o.   MRN: [id number]  Patient Care Team: Sonny Masters, FNP as PCP - General (Family Medicine)   Chief Complaint:  Numbness (Patient states that she has been having bilateral foot and hand numbness that has been going on awhile but has now been going on more often.  They will also turn white )   HPI: Cassandra Tucker is a 33 y.o. female presenting on 09/27/2023 for Numbness (Patient states that she has been having bilateral foot and hand numbness that has been going on awhile but has now been going on more often.  They will also turn white )   Discussed the use of AI scribe software for clinical note transcription with the patient, who gave verbal consent to proceed.  History of Present Illness   Cassandra Tucker is a 33 year old female with Raynaud's syndrome who presents with worsening symptoms.  She experiences episodes where her hands and feet become cold and discolored, which improve upon warming them under hot water or using heat. The symptoms primarily affect her right hand, although her left hand is also occasionally involved.  During episodes, she describes a sensation of swelling and numbness, stating 'I can touch them, but I can't feel it.' No persistent color changes that do not return to normal after warming, and she does not experience fever or loss of function following episodes.  Her symptoms are triggered by cold weather and can occur at any time of the day. She attempts to prevent episodes by wrapping her fingers, but this does not always stop the symptoms.     Relevant past medical, surgical, family, and social history reviewed and updated as indicated.  Allergies and medications reviewed and updated. Data reviewed: Chart in Epic.   Past Medical History:  Diagnosis Date   Ankle fracture, left 2000   Anxiety    Arthritis    left wrist and left ankle   Asthma    Back pain    Back  pain    Bronchitis    Depression    Diabetes (HCC) 01/11/2016   type II  history of   Family history of adverse reaction to anesthesia    gmother had problems with N/V    GERD (gastroesophageal reflux disease)    Headache    High cholesterol    History of kidney stones    HLD (hyperlipidemia)    HSV-2 infection    Hypertension 2013   Intracranial hypertension    psuedotumor cebrum   Joint pain    Joint pain    Kienbock's disease    Leg edema    OSA (obstructive sleep apnea)    cpap - does not know settings    Papilledema    Prediabetes    Pregnancy induced hypertension    Pseudotumor cerebri syndrome    RLS (restless legs syndrome)    Sinus tachycardia    SOB (shortness of breath)     Past Surgical History:  Procedure Laterality Date   CESAREAN SECTION N/A 07/17/2019   Procedure: CESAREAN SECTION;  Surgeon: Osborn Coho, MD;  Location: [facility];  Service: Obstetrics;  Laterality: N/A;   FRACTURE SURGERY  2005   ankle   FRACTURE SURGERY  2013   wrist (deteriorating bone)   LAPAROSCOPIC GASTRIC SLEEVE RESECTION N/A 05/08/2017   Procedure: LAPAROSCOPIC GASTRIC SLEEVE RESECTION;  Surgeon: Berna Bue, MD;  Location:  [facility];  Service: General;  Laterality: N/A;   LAPAROSCOPIC GASTRIC SLEEVE RESECTION  05/08/2017   UPPER GI ENDOSCOPY  05/08/2017   Procedure: UPPER GI ENDOSCOPY;  Surgeon: Berna Bue, MD;  Location: [facility];  Service: General;;   urethra stretch     WRIST SURGERY  2015    Social History   Socioeconomic History   Marital status: Married    Spouse name: Neil Crouch   Number of children: 0   Years of education: 12+   Highest education level: Some college, no degree  Occupational History   Occupation: Engineer, drilling: [employer]  Tobacco Use   Smoking status: Every Day    Current packs/day: 0.00    Types: Cigarettes    Last attempt to quit: 10/05/2018    Years since quitting: 4.9   Smokeless tobacco: Never  Vaping Use   Vaping status: Never  Used  Substance and Sexual Activity   Alcohol use: Not Currently   Drug use: Not Currently    Types: Marijuana   Sexual activity: Yes    Birth control/protection: I.U.D.    Comment: condoms previously  Other Topics Concern   Not on file  Social History Narrative   Lives with grandparents   Caffeine use: Drinks coffee/tea/soda- 20oz per day (none after starting diamox)   Social Drivers of Health   Financial Resource Strain: Medium Risk (09/27/2023)   Overall Financial Resource Strain (CARDIA)    Difficulty of Paying Living Expenses: Somewhat hard  Food Insecurity: Food Insecurity Present (09/27/2023)   Hunger Vital Sign    Worried About Running Out of Food in the Last Year: Sometimes true    Ran Out of Food in the Last Year: Sometimes true  Transportation Needs: No Transportation Needs (09/27/2023)   PRAPARE - Administrator, Civil Service (Medical): No    Lack of Transportation (Non-Medical): No  Physical Activity: Unknown (09/27/2023)   Exercise Vital Sign    Days of Exercise per Week: 0 days    Minutes of Exercise per Session: Not on file  Stress: Stress Concern Present (09/27/2023)   [employer] of Occupational Health - Occupational Stress Questionnaire    Feeling of Stress : Very much  Social Connections: Moderately Isolated (09/27/2023)   Social Connection and Isolation Panel [NHANES]    Frequency of Communication with Friends and Family: More than three times a week    Frequency of Social Gatherings with Friends and Family: Patient declined    Attends Religious Services: 1 to 4 times per year    Active Member of [employer] or Organizations: No    Attends Engineer, structural: Not on file    Marital Status: Separated  Intimate Partner Violence: Unknown (02/19/2023)   Received from [facility]   HITS    Physically Hurt: Not on file    Insult or Talk Down To: Not on file    Threaten Physical Harm: Not on file    Scream or Curse: Not on file     Outpatient Encounter Medications as of 09/27/2023  Medication Sig   albuterol (PROVENTIL) (2.5 MG/3ML) 0.083% nebulizer solution Take 3 mLs (2.5 mg total) by nebulization every 4 (four) hours as needed for wheezing or shortness of breath (coughing fits).   amLODipine (NORVASC) 5 MG tablet Take 1 tablet (5 mg total) by mouth daily.   atorvastatin (LIPITOR) 40 MG tablet Take 1 tablet (40 mg total) by mouth daily.   azelastine (ASTELIN) 0.1 % nasal  spray Place 1 spray into both nostrils daily as needed for rhinitis. Use in each nostril as directed   Benralizumab (FASENRA PEN) 30 MG/ML SOAJ INJECT 1 PEN UNDER THE SKIN EVERY 8 WEEKS.   Budeson-Glycopyrrol-Formoterol (BREZTRI AEROSPHERE) 160-9-4.8 MCG/ACT AERO Inhale 2 puffs into the lungs in the morning and at bedtime. with spacer and rinse mouth afterwards.   cromolyn (OPTICROM) 4 % ophthalmic solution Place 1 drop into both eyes 4 (four) times daily as needed (itchy/watery eyes).   famotidine (PEPCID) 20 MG tablet Take by mouth.   fenofibrate (TRICOR) 145 MG tablet Take 1 tablet (145 mg total) by mouth daily.   fluticasone (FLONASE) 50 MCG/ACT nasal spray Place 1 spray into both nostrils 2 (two) times daily as needed (nasal congestion).   levocetirizine (XYZAL) 5 MG tablet Take 1 tablet (5 mg total) by mouth every evening.   levonorgestrel (MIRENA, 52 MG,) 20 MCG/24HR IUD Mirena 20 mcg/24 hours (6 yrs) 52 mg intrauterine device  Take 1 device by intrauterine route.   lisinopril-hydrochlorothiazide (ZESTORETIC) 20-25 MG tablet Take 1 tablet by mouth daily.   tirzepatide (MOUNJARO) 15 MG/0.5ML Pen INJECT 15 MG INTO THE SKIN ONCE A WEEK.   VENTOLIN HFA 108 (90 Base) MCG/ACT inhaler Inhale 2 puffs into the lungs every 4 (four) hours as needed for wheezing or shortness of breath.   acetaZOLAMIDE (DIAMOX) 250 MG tablet Take 500 mg by mouth 2 (two) times daily. (Patient not taking: Reported on 09/27/2023)   NIFEdipine (ADALAT CC) 60 MG 24 hr tablet Take  60 mg by mouth every morning. (Patient not taking: No sig reported)   omeprazole (PRILOSEC) 20 MG capsule Take 1 capsule (20 mg total) by mouth daily. (Patient not taking: Reported on 09/27/2023)   [DISCONTINUED] cetirizine (ZYRTEC) 10 MG tablet Take 10 mg by mouth daily.   [DISCONTINUED] lidocaine (LIDODERM) 5 % PLACE 1 PATCH ONTO THE SKIN DAILY. REMOVE & DISCARD PATCH WITHIN 12 HOURS OR AS DIRECTED BY MD   No facility-administered encounter medications on file as of 09/27/2023.    Allergies  Allergen Reactions   Cats Claw (Uncaria Tomentosa) Itching, Shortness Of Breath and Swelling   Dust Mite Extract Itching and Shortness Of Breath   Grass Pollen(K-O-R-T-Swt Vern) Itching, Shortness Of Breath and Other (See Comments)   Mixed Feathers Itching, Shortness Of Breath and Swelling   Uncaria Tomentosa (Cats Claw) Itching and Swelling   Cat Dander Other (See Comments)   Gramineae Pollens Other (See Comments), Cough and Itching    POLLEN EXTRACTS   Lactose Other (See Comments)    lactose   Other Other (See Comments)   Pollen Extract Other (See Comments)    Pertinent ROS per HPI, otherwise unremarkable      Objective:  BP 134/84   Pulse 89   Temp 97.8 F (36.6 C)   Ht 5\' 6"  (1.676 m)   Wt 249 lb 6.4 oz (113.1 kg)   SpO2 95%   BMI 40.25 kg/m    Wt Readings from Last 3 Encounters:  09/27/23 249 lb 6.4 oz (113.1 kg)  09/04/23 248 lb 8 oz (112.7 kg)  08/28/23 248 lb 6.4 oz (112.7 kg)    Physical Exam Vitals and nursing note reviewed.  Constitutional:      General: She is not in acute distress.    Appearance: Normal appearance. She is obese. She is not ill-appearing, toxic-appearing or diaphoretic.  HENT:     Head: Normocephalic and atraumatic.     Nose: Nose normal.  Mouth/Throat:     Mouth: Mucous membranes are moist.  Eyes:     Conjunctiva/sclera: Conjunctivae normal.     Pupils: Pupils are equal, round, and reactive to light.  Cardiovascular:     Rate and Rhythm:  Normal rate and regular rhythm.     Pulses: Normal pulses.     Heart sounds: Normal heart sounds.  Pulmonary:     Effort: Pulmonary effort is normal.     Breath sounds: Normal breath sounds.  Musculoskeletal:     Cervical back: Neck supple.     Right lower leg: No edema.     Left lower leg: No edema.  Skin:    General: Skin is warm and dry.     Capillary Refill: Capillary refill takes less than 2 seconds.  Neurological:     General: No focal deficit present.     Mental Status: She is alert and oriented to person, place, and time.  Psychiatric:        Mood and Affect: Mood normal.        Behavior: Behavior normal.        Thought Content: Thought content normal.        Judgment: Judgment normal.    Images available in media section, sent from pt through MyChart.    Results for orders placed or performed in visit on 08/28/23  Bayer DCA Hb A1c Waived   Collection Time: 08/28/23 11:20 AM  Result Value Ref Range   HB A1C (BAYER DCA - WAIVED) 4.8 4.8 - 5.6 %  Lipid panel   Collection Time: 08/28/23 11:25 AM  Result Value Ref Range   Cholesterol, Total 192 100 - 199 mg/dL   Triglycerides 94 0 - 149 mg/dL   HDL 40 >60 mg/dL   VLDL Cholesterol Cal 17 5 - 40 mg/dL   LDL Chol Calc (NIH) 454 (H) 0 - 99 mg/dL   Chol/HDL Ratio 4.8 (H) 0.0 - 4.4 ratio  CBC with Differential/Platelet   Collection Time: 08/28/23 11:25 AM  Result Value Ref Range   WBC 8.1 3.4 - 10.8 x10E3/uL   RBC 5.20 3.77 - 5.28 x10E6/uL   Hemoglobin 14.7 11.1 - 15.9 g/dL   Hematocrit 09.8 11.9 - 46.6 %   MCV 86 79 - 97 fL   MCH 28.3 26.6 - 33.0 pg   MCHC 32.7 31.5 - 35.7 g/dL   RDW 14.7 82.9 - 56.2 %   Platelets 265 150 - 450 x10E3/uL   Neutrophils 65 Not Estab. %   Lymphs 29 Not Estab. %   Monocytes 6 Not Estab. %   Eos 0 Not Estab. %   Basos 0 Not Estab. %   Neutrophils Absolute 5.2 1.4 - 7.0 x10E3/uL   Lymphocytes Absolute 2.4 0.7 - 3.1 x10E3/uL   Monocytes Absolute 0.5 0.1 - 0.9 x10E3/uL   EOS  (ABSOLUTE) 0.0 0.0 - 0.4 x10E3/uL   Basophils Absolute 0.0 0.0 - 0.2 x10E3/uL   Immature Granulocytes 0 Not Estab. %   Immature Grans (Abs) 0.0 0.0 - 0.1 x10E3/uL  CMP14+EGFR   Collection Time: 08/28/23 11:25 AM  Result Value Ref Range   Glucose 68 (L) 70 - 99 mg/dL   BUN 12 6 - 20 mg/dL   Creatinine, Ser 1.30 0.57 - 1.00 mg/dL   eGFR 865 >78 IO/NGE/9.52   BUN/Creatinine Ratio 19 9 - 23   Sodium 142 134 - 144 mmol/L   Potassium 4.1 3.5 - 5.2 mmol/L   Chloride 101 96 - 106 mmol/L  CO2 23 20 - 29 mmol/L   Calcium 9.3 8.7 - 10.2 mg/dL   Total Protein 6.9 6.0 - 8.5 g/dL   Albumin 4.6 3.9 - 4.9 g/dL   Globulin, Total 2.3 1.5 - 4.5 g/dL   Bilirubin Total 1.3 (H) 0.0 - 1.2 mg/dL   Alkaline Phosphatase 63 44 - 121 IU/L   AST 14 0 - 40 IU/L   ALT 10 0 - 32 IU/L  Thyroid Panel With TSH   Collection Time: 08/28/23 11:25 AM  Result Value Ref Range   TSH 1.540 0.450 - 4.500 uIU/mL   T4, Total 10.2 4.5 - 12.0 ug/dL   T3 Uptake Ratio 32 24 - 39 %   Free Thyroxine Index 3.3 1.2 - 4.9  VITAMIN D 25 Hydroxy (Vit-D Deficiency, Fractures)   Collection Time: 08/28/23 11:25 AM  Result Value Ref Range   Vit D, 25-Hydroxy 21.6 (L) 30.0 - 100.0 ng/mL  Microalbumin / creatinine urine ratio   Collection Time: 08/28/23 11:38 AM  Result Value Ref Range   Creatinine, Urine 108.6 Not Estab. mg/dL   Microalbumin, Urine 16.1 Not Estab. ug/mL   Microalb/Creat Ratio 14 0 - 29 mg/g creat       Pertinent labs & imaging results that were available during my care of the patient were reviewed by me and considered in my medical decision making.  Assessment & Plan:  Cassandra Tucker was seen today for numbness.  Diagnoses and all orders for this visit:  Raynaud's disease without gangrene -     amLODipine (NORVASC) 5 MG tablet; Take 1 tablet (5 mg total) by mouth daily.     Assessment and Plan    Raynaud's Syndrome Worsening Raynaud's syndrome, primarily affecting the right hand, with swelling and numbness  triggered by cold exposure. No persistent color change or loss of function post-episode. More common in women and those with diabetes. - Start amlodipine 5 mg daily to dilate peripheral vasculature and improve blood flow. - Provide educational materials on Raynaud's syndrome. - Advise to keep track of symptom frequency and report any adverse effects. - Schedule follow-up appointment in 4-6 weeks to assess treatment efficacy and monitor for side effects.  Hypertension Blood pressure is elevated, allowing for medication adjustment without significant risk of hypotension. The addition of amlodipine may also benefit blood pressure control. - Monitor blood pressure and adjust treatment as necessary.          Continue all other maintenance medications.  Follow up plan: Return in about 6 weeks (around 11/08/2023), or if symptoms worsen or fail to improve.   Continue healthy lifestyle choices, including diet (rich in fruits, vegetables, and lean proteins, and low in salt and simple carbohydrates) and exercise (at least 30 minutes of moderate physical activity daily).  Educational handout given for Raynaud's   The above assessment and management plan was discussed with the patient. The patient verbalized understanding of and has agreed to the management plan. Patient is aware to call the clinic if they develop any new symptoms or if symptoms persist or worsen. Patient is aware when to return to the clinic for a follow-up visit. Patient educated on when it is appropriate to go to the emergency department.   Kari Baars, FNP-C [facility] [phone]

## 2023-10-02 ENCOUNTER — Encounter

## 2023-10-02 DIAGNOSIS — M9903 Segmental and somatic dysfunction of lumbar region: Secondary | ICD-10-CM | POA: Diagnosis not present

## 2023-10-02 DIAGNOSIS — M9902 Segmental and somatic dysfunction of thoracic region: Secondary | ICD-10-CM | POA: Diagnosis not present

## 2023-10-02 DIAGNOSIS — M62830 Muscle spasm of back: Secondary | ICD-10-CM | POA: Diagnosis not present

## 2023-10-02 DIAGNOSIS — M9901 Segmental and somatic dysfunction of cervical region: Secondary | ICD-10-CM | POA: Diagnosis not present

## 2023-10-03 DIAGNOSIS — M62830 Muscle spasm of back: Secondary | ICD-10-CM | POA: Diagnosis not present

## 2023-10-03 DIAGNOSIS — M9901 Segmental and somatic dysfunction of cervical region: Secondary | ICD-10-CM | POA: Diagnosis not present

## 2023-10-03 DIAGNOSIS — M9902 Segmental and somatic dysfunction of thoracic region: Secondary | ICD-10-CM | POA: Diagnosis not present

## 2023-10-03 DIAGNOSIS — M9903 Segmental and somatic dysfunction of lumbar region: Secondary | ICD-10-CM | POA: Diagnosis not present

## 2023-10-09 ENCOUNTER — Encounter

## 2023-10-09 DIAGNOSIS — M62830 Muscle spasm of back: Secondary | ICD-10-CM | POA: Diagnosis not present

## 2023-10-09 DIAGNOSIS — M9901 Segmental and somatic dysfunction of cervical region: Secondary | ICD-10-CM | POA: Diagnosis not present

## 2023-10-09 DIAGNOSIS — M9903 Segmental and somatic dysfunction of lumbar region: Secondary | ICD-10-CM | POA: Diagnosis not present

## 2023-10-09 DIAGNOSIS — M9902 Segmental and somatic dysfunction of thoracic region: Secondary | ICD-10-CM | POA: Diagnosis not present

## 2023-10-16 ENCOUNTER — Encounter

## 2023-10-17 ENCOUNTER — Other Ambulatory Visit: Payer: Self-pay | Admitting: Allergy

## 2023-10-19 ENCOUNTER — Ambulatory Visit: Admitting: Family Medicine

## 2023-10-19 DIAGNOSIS — B349 Viral infection, unspecified: Secondary | ICD-10-CM | POA: Diagnosis not present

## 2023-10-19 DIAGNOSIS — F17210 Nicotine dependence, cigarettes, uncomplicated: Secondary | ICD-10-CM | POA: Diagnosis not present

## 2023-10-19 DIAGNOSIS — H6501 Acute serous otitis media, right ear: Secondary | ICD-10-CM | POA: Diagnosis not present

## 2023-10-19 DIAGNOSIS — Z20822 Contact with and (suspected) exposure to covid-19: Secondary | ICD-10-CM | POA: Diagnosis not present

## 2023-10-19 DIAGNOSIS — J029 Acute pharyngitis, unspecified: Secondary | ICD-10-CM | POA: Diagnosis not present

## 2023-10-21 DIAGNOSIS — R059 Cough, unspecified: Secondary | ICD-10-CM | POA: Diagnosis not present

## 2023-10-21 DIAGNOSIS — J309 Allergic rhinitis, unspecified: Secondary | ICD-10-CM | POA: Diagnosis not present

## 2023-10-21 DIAGNOSIS — J069 Acute upper respiratory infection, unspecified: Secondary | ICD-10-CM | POA: Diagnosis not present

## 2023-10-21 DIAGNOSIS — K219 Gastro-esophageal reflux disease without esophagitis: Secondary | ICD-10-CM | POA: Diagnosis not present

## 2023-10-21 DIAGNOSIS — R0989 Other specified symptoms and signs involving the circulatory and respiratory systems: Secondary | ICD-10-CM | POA: Diagnosis not present

## 2023-10-21 DIAGNOSIS — I10 Essential (primary) hypertension: Secondary | ICD-10-CM | POA: Diagnosis not present

## 2023-10-21 DIAGNOSIS — Z1152 Encounter for screening for COVID-19: Secondary | ICD-10-CM | POA: Diagnosis not present

## 2023-10-29 ENCOUNTER — Ambulatory Visit: Admitting: Orthopedic Surgery

## 2023-10-29 DIAGNOSIS — M545 Low back pain, unspecified: Secondary | ICD-10-CM | POA: Diagnosis not present

## 2023-10-29 NOTE — Progress Notes (Signed)
 Orthopedic Spine Surgery Office Note   Assessment: Patient is a 33 y.o. female with low back pain. No radicular symptoms. Has a degenerative disc at L5/S1     Plan: -Patient has tried PT, lidocaine  patches, chiropractor, tylenol , prednisone , methocarbamol   -Continue with PT -If she is not doing any better after 6 weeks of PT, we will order an MRI of the lumbar spine to evaluate further.  She will send me a message or call the office to update me on her progress with PT -Would need to be nicotine free prior to any elective spine surgery -Patient should return to office in 8 weeks, x-rays at next visit: none    ___________________________________________________________________________     History:   Patient is a 33 y.o. female who presents today for follow up on her lumbar spine.  She continues to have significant lower lumbar back pain.  She has no pain rating into either lower extremity.  She has not noticed any changes in her symptoms since she was last seen.  She has seen a chiropractor and done some physical therapy since then.  She has done about 4 weeks of PT and has not noticed any real changes in her symptoms.   Treatments tried: PT, lidocaine  patches, chiropractor, tylenol , methocarbamol , prednisone     Physical Exam:   General: no acute distress, appears stated age Neurologic: alert, answering questions appropriately, following commands Respiratory: unlabored breathing on room air, symmetric chest rise Psychiatric: appropriate affect, normal cadence to speech     MSK (spine):   -Strength exam                                                   Left                  Right EHL                              5/5                  5/5 TA                                 5/5                  5/5 GSC                             5/5                  5/5 Knee extension            5/5                  5/5 Hip flexion                    5/5                  5/5   -Sensory exam                            Sensation intact to light touch in L3-S1 nerve distributions of bilateral lower extremities  Imaging: XRs of the lumbar spine from 09/03/2023 were independently reviewed and interpreted, showing disc height loss with anterior osteophyte formation at L5/S1.  No evidence of instability on flexion/extension views.  No fracture or dislocation seen.     Patient name: Cassandra Tucker Patient MRN: [id number] Date of visit: 10/29/23

## 2023-11-01 ENCOUNTER — Ambulatory Visit: Attending: Orthopedic Surgery

## 2023-11-01 DIAGNOSIS — R293 Abnormal posture: Secondary | ICD-10-CM | POA: Insufficient documentation

## 2023-11-01 DIAGNOSIS — M545 Low back pain, unspecified: Secondary | ICD-10-CM | POA: Diagnosis not present

## 2023-11-01 DIAGNOSIS — M5459 Other low back pain: Secondary | ICD-10-CM | POA: Diagnosis not present

## 2023-11-01 DIAGNOSIS — G8929 Other chronic pain: Secondary | ICD-10-CM | POA: Insufficient documentation

## 2023-11-01 NOTE — Therapy (Signed)
 OUTPATIENT PHYSICAL THERAPY THORACOLUMBAR TREATMENT   Patient Name: Cassandra Tucker MRN: [id number] DOB:[birth year], 33 y.o., female Today's Date: 11/01/2023  END OF SESSION:  PT End of Session - 11/01/23 0803     Visit Number 4    Number of Visits 8    Date for PT Re-Evaluation 11/09/23    Authorization Type [employer]    Authorization Time Period 09/12/23-11/10/23    Authorization - Number of Visits 6    PT Start Time 0800    PT Stop Time 0842    PT Time Calculation (min) 42 min    Activity Tolerance Patient tolerated treatment well    Behavior During Therapy [facility] for tasks assessed/performed              Past Medical History:  Diagnosis Date   Ankle fracture, left 2000   Anxiety    Arthritis    left wrist and left ankle   Asthma    Back pain    Back pain    Bronchitis    Depression    Diabetes (HCC) 01/11/2016   type II  history of   Family history of adverse reaction to anesthesia    gmother had problems with N/V    GERD (gastroesophageal reflux disease)    Headache    High cholesterol    History of kidney stones    HLD (hyperlipidemia)    HSV-2 infection    Hypertension 2013   Intracranial hypertension    psuedotumor cebrum   Joint pain    Joint pain    Kienbock's disease    Leg edema    OSA (obstructive sleep apnea)    cpap - does not know settings    Papilledema    Prediabetes    Pregnancy induced hypertension    Pseudotumor cerebri syndrome    RLS (restless legs syndrome)    Sinus tachycardia    SOB (shortness of breath)    Past Surgical History:  Procedure Laterality Date   CESAREAN SECTION N/A 07/17/2019   Procedure: CESAREAN SECTION;  Surgeon: Renea Carrion, MD;  Location: [facility] LD [facility];  Service: Obstetrics;  Laterality: N/A;   FRACTURE SURGERY  2005   ankle   FRACTURE SURGERY  2013   wrist (deteriorating bone)   LAPAROSCOPIC GASTRIC SLEEVE RESECTION N/A 05/08/2017   Procedure: LAPAROSCOPIC GASTRIC SLEEVE RESECTION;  Surgeon: Adalberto Acton, MD;  Location: [facility] ORS;  Service: General;  Laterality: N/A;   LAPAROSCOPIC GASTRIC SLEEVE RESECTION  05/08/2017   UPPER GI ENDOSCOPY  05/08/2017   Procedure: UPPER GI ENDOSCOPY;  Surgeon: Adalberto Acton, MD;  Location: [facility] ORS;  Service: General;;   urethra stretch     WRIST SURGERY  2015   Patient Active Problem List   Diagnosis Date Noted   Urge incontinence of urine 05/22/2023   Cannabis abuse 08/08/2022   History of sleeve gastrectomy 08/08/2022   Back pain 04/11/2022   Panniculitis 04/11/2022   GAD (generalized anxiety disorder) 04/21/2021   Depression, recurrent (HCC) 04/21/2021   History of cesarean section 07/24/2020   History of gestational hypertension 07/24/2020   Severe persistent asthma without complication 02/19/2020   Seasonal and perennial allergic rhinoconjunctivitis 01/01/2020   BMI 45.0-49.9, adult (HCC) 06/29/2019   Anemia 05/21/2019   History of depression 01/28/2019   History of bariatric surgery 01/13/2019   Genital herpes simplex 01/13/2019   Vitamin D  deficiency 02/26/2017   Class 3 obesity with serious comorbidity and body mass index (BMI) of 60.0  to 69.9 in adult 02/26/2017   Controlled type 2 diabetes mellitus without complication, without long-term current use of insulin  (HCC) 04/21/2016   Gastroesophageal reflux disease without esophagitis 04/21/2016   Pseudotumor cerebri syndrome 11/29/2015   Super obesity 11/29/2015   OSA on CPAP 11/29/2015   RLS (restless legs syndrome) 11/29/2015   Papilledema 06/16/2015   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   Hyperlipidemia associated with type 2 diabetes mellitus (HCC) 01/21/2014   Sinus tachycardia 12/04/2011   Hypertension associated with type 2 diabetes mellitus (HCC) 12/04/2011   Morbid obesity (HCC) 11/07/2011    PCP: Galvin Jules, FNP  REFERRING PROVIDER: Diedra Fowler, MD   REFERRING DIAG: Lumbar pain   Rationale for Evaluation and Treatment: Rehabilitation  THERAPY DIAG:   Other low back pain  Chronic bilateral low back pain without sciatica  Abnormal posture  ONSET DATE: 6 months  SUBJECTIVE:                                                                                                                                                                                           SUBJECTIVE STATEMENT: Pt denies any pain this morning.   PERTINENT HISTORY:  Hypertension, allergies, diabetes type 2, obesity, depression, anxiety, and current smoker  PAIN:  Are you having pain? Yes: NPRS scale: 4/10 Pain location: mid to low back Pain description: constant, dull pain Aggravating factors: sitting still, laying on her side (20 minutes at most, preferred position),  Relieving factors: laying on her back, topical creams, Prednisone , medication  PRECAUTIONS: None  RED FLAGS: None   WEIGHT BEARING RESTRICTIONS: No  FALLS:  Has patient fallen in last 6 months? No  LIVING ENVIRONMENT: Lives with: lives with their daughter Lives in: House/apartment Stairs: Yes: External: 2 steps; on right going up Has following equipment at home: None  OCCUPATION: works from home  PLOF: Independent  PATIENT GOALS: reduced pain, sit and stand longer (approximately 30 minutes currently), lifting her daughter  NEXT MD VISIT: 10/29/23  OBJECTIVE:  Note: Objective measures were completed at Evaluation unless otherwise noted.  DIAGNOSTIC FINDINGS:  XRs of the lumbar spine from 09/03/2023 were independently reviewed and interpreted, showing disc height loss with anterior osteophyte formation at L5/S1.  No evidence of instability on flexion/extension views.  No fracture or dislocation seen.   PATIENT SURVEYS:  Modified Oswestry 34% disability   COGNITION: Overall cognitive status: Within functional limits for tasks assessed     SENSATION: Patient reports no numbness or tingling  PALPATION: TTP: bilateral lumbar paraspinals, QL, and right gluteals Increased  tenderness to palpation along right lumbar musculature  LUMBAR JOINT MOBILITY:  L1-2: hypomobile and familiar pain  L2-5: WFL and nonpainful  L5-S1: hypomobile and familiar pain  LUMBAR ROM:   AROM eval  Flexion WFL; "pulling"   Extension 14  Right lateral flexion WFL; "pulling in mid back'   Left lateral flexion WFL   Right rotation 50% limited  Left rotation 25% limited   (Blank rows = not tested)  LOWER EXTREMITY ROM: WFL for activities  LOWER EXTREMITY MMT:    MMT Right eval Left eval  Hip flexion 4/5 4/5  Hip extension    Hip abduction    Hip adduction    Hip internal rotation    Hip external rotation    Knee flexion 5/5 5/5  Knee extension 4/5 4/5  Ankle dorsiflexion    Ankle plantarflexion    Ankle inversion    Ankle eversion     (Blank rows = not tested)  GAIT: Assistive device utilized: None Level of assistance: Complete Independence Comments: no significant gait deviations observed  TREATMENT DATE:                                                                                                                                11/01/23    EXERCISE LOG  Exercise Repetitions and Resistance Comments  Nustep  L4 x 16 minutes   Rockerboard 4 mins   Forward Step Ups 6" box x 3 mins alternating   Resisted pull down  Blue x 3 minutes   Seated dead lift    LAQ 4# x 25 reps bil   Standing HS curl  5# x 3 x 10 reps each    Standing hip ABD  2# 2 x 15 reps each   Standing hip Extension 2# 2 x 15 reps each   Horizontal ABD  Green t-band x 2 x 15 reps     Blank cell = exercise not performed today    PATIENT EDUCATION:  Education details: POC, healing, objective findings, x-ray results, and goals for physical therapy Person educated: Patient Education method: Explanation Education comprehension: verbalized understanding  HOME EXERCISE PROGRAM:   ASSESSMENT:  CLINICAL IMPRESSION: Pt arrives for today's treatment session denying any pain.  Pt reports that  she if feeling "pretty good" today.  Pt able to tolerate increased reps with standing hip exercises today with minimal glute discomfort.  Pt introduced to alternating forward step ups with min cues for sequencing required.  Pt also able to tolerate increased reps with seated exercises today.  Pt denied any pain at completion of today's treatment session.  OBJECTIVE IMPAIRMENTS: decreased activity tolerance, decreased mobility, difficulty walking, decreased ROM, decreased strength, hypomobility, impaired tone, and pain.   ACTIVITY LIMITATIONS: lifting, bending, sitting, standing, sleeping, and locomotion level  PARTICIPATION LIMITATIONS: meal prep, cleaning, laundry, and community activity  PERSONAL FACTORS: Time since onset of injury/illness/exacerbation and 3+ comorbidities: Hypertension, allergies, diabetes type 2, obesity, depression, anxiety, and current smoker are also affecting patient's functional outcome.  REHAB POTENTIAL: Good  CLINICAL DECISION MAKING: Evolving/moderate complexity  EVALUATION COMPLEXITY: Moderate   GOALS: Goals reviewed with patient? Yes  LONG TERM GOALS: Target date: 10/08/23  Patient will be independent with her HEP.  Baseline:  Goal status: IN PROGRESS  2.  Patient will be able pick up her daughter without her familiar pain exceeding 2/10.  Baseline:  Goal status: IN PROGRESS  3.  Patient will report being able to sleep at least 7 hours without being awakened by her familiar symptoms.  Baseline:  Goal status: IN PROGRESS  4.  Patient will improved her ODI score to 24% disability or less for improved perceived function with her daily activities.  Baseline:  Goal status: IN PROGRESS  PLAN:  PT FREQUENCY: 2x/week  PT DURATION: 4 weeks  PLANNED INTERVENTIONS: 97164- PT Re-evaluation, 97110-Therapeutic exercises, 97530- Therapeutic activity, 97112- Neuromuscular re-education, 97535- Self Care, 13086- Manual therapy, Patient/Family education,  Balance training, Joint mobilization, and Spinal mobilization.  PLAN FOR NEXT SESSION: Nustep, lumbar and lower extremity strengthening, and manual therapy   Deryl Flora, PTA 11/01/2023, 8:56 AM

## 2023-11-06 ENCOUNTER — Ambulatory Visit

## 2023-11-06 ENCOUNTER — Encounter: Payer: Self-pay | Admitting: Orthopedic Surgery

## 2023-11-06 DIAGNOSIS — G8929 Other chronic pain: Secondary | ICD-10-CM

## 2023-11-06 DIAGNOSIS — M5459 Other low back pain: Secondary | ICD-10-CM

## 2023-11-06 DIAGNOSIS — M545 Low back pain, unspecified: Secondary | ICD-10-CM

## 2023-11-06 DIAGNOSIS — R293 Abnormal posture: Secondary | ICD-10-CM | POA: Diagnosis not present

## 2023-11-06 NOTE — Therapy (Addendum)
 OUTPATIENT PHYSICAL THERAPY THORACOLUMBAR TREATMENT   Patient Name: Cassandra Tucker MRN: [id number] DOB:10/15/1990, 33 y.o., female Today's Date: 11/06/2023  END OF SESSION:  PT End of Session - 11/06/23 0802     Visit Number 5    Number of Visits 8    Date for PT Re-Evaluation 11/09/23    Authorization Type [employer]    Authorization Time Period 09/12/23-11/10/23    Authorization - Number of Visits 6    PT Start Time 0800    PT Stop Time 0845    PT Time Calculation (min) 45 min    Activity Tolerance Patient tolerated treatment well    Behavior During Therapy [facility] for tasks assessed/performed              Past Medical History:  Diagnosis Date   Ankle fracture, left 2000   Anxiety    Arthritis    left wrist and left ankle   Asthma    Back pain    Back pain    Bronchitis    Depression    Diabetes (HCC) 01/11/2016   type II  history of   Family history of adverse reaction to anesthesia    gmother had problems with N/V    GERD (gastroesophageal reflux disease)    Headache    High cholesterol    History of kidney stones    HLD (hyperlipidemia)    HSV-2 infection    Hypertension 2013   Intracranial hypertension    psuedotumor cebrum   Joint pain    Joint pain    Kienbock's disease    Leg edema    OSA (obstructive sleep apnea)    cpap - does not know settings    Papilledema    Prediabetes    Pregnancy induced hypertension    Pseudotumor cerebri syndrome    RLS (restless legs syndrome)    Sinus tachycardia    SOB (shortness of breath)    Past Surgical History:  Procedure Laterality Date   CESAREAN SECTION N/A 07/17/2019   Procedure: CESAREAN SECTION;  Surgeon: Renea Carrion, MD;  Location: [facility] LD [facility];  Service: Obstetrics;  Laterality: N/A;   FRACTURE SURGERY  2005   ankle   FRACTURE SURGERY  2013   wrist (deteriorating bone)   LAPAROSCOPIC GASTRIC SLEEVE RESECTION N/A 05/08/2017   Procedure: LAPAROSCOPIC GASTRIC SLEEVE RESECTION;  Surgeon: Adalberto Acton, MD;  Location: [facility];  Service: General;  Laterality: N/A;   LAPAROSCOPIC GASTRIC SLEEVE RESECTION  05/08/2017   UPPER GI ENDOSCOPY  05/08/2017   Procedure: UPPER GI ENDOSCOPY;  Surgeon: Adalberto Acton, MD;  Location: [facility];  Service: General;;   urethra stretch     WRIST SURGERY  2015   Patient Active Problem List   Diagnosis Date Noted   Urge incontinence of urine 05/22/2023   Cannabis abuse 08/08/2022   History of sleeve gastrectomy 08/08/2022   Back pain 04/11/2022   Panniculitis 04/11/2022   GAD (generalized anxiety disorder) 04/21/2021   Depression, recurrent (HCC) 04/21/2021   History of cesarean section 07/24/2020   History of gestational hypertension 07/24/2020   Severe persistent asthma without complication 02/19/2020   Seasonal and perennial allergic rhinoconjunctivitis 01/01/2020   BMI 45.0-49.9, adult (HCC) 06/29/2019   Anemia 05/21/2019   History of depression 01/28/2019   History of bariatric surgery 01/13/2019   Genital herpes simplex 01/13/2019   Vitamin D  deficiency 02/26/2017   Class 3 obesity with serious comorbidity and body mass index (BMI) of 60.0  to 69.9 in adult 02/26/2017   Controlled type 2 diabetes mellitus without complication, without long-term current use of insulin  (HCC) 04/21/2016   Gastroesophageal reflux disease without esophagitis 04/21/2016   Pseudotumor cerebri syndrome 11/29/2015   Super obesity 11/29/2015   OSA on CPAP 11/29/2015   RLS (restless legs syndrome) 11/29/2015   Papilledema 06/16/2015   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   Hyperlipidemia associated with type 2 diabetes mellitus (HCC) 01/21/2014   Sinus tachycardia 12/04/2011   Hypertension associated with type 2 diabetes mellitus (HCC) 12/04/2011   Morbid obesity (HCC) 11/07/2011    PCP: Galvin Jules, FNP  REFERRING PROVIDER: Diedra Fowler, MD   REFERRING DIAG: Lumbar pain   Rationale for Evaluation and Treatment: Rehabilitation  THERAPY DIAG:   Other low back pain  Chronic bilateral low back pain without sciatica  Abnormal posture  ONSET DATE: 6 months  SUBJECTIVE:                                                                                                                                                                                           SUBJECTIVE STATEMENT: Pt reports 3/10 low back pain today.  Pt ready for discharge today.   PERTINENT HISTORY:  Hypertension, allergies, diabetes type 2, obesity, depression, anxiety, and current smoker  PAIN:  Are you having pain? Yes: NPRS scale: 3/10 Pain location: mid to low back Pain description: constant, dull pain Aggravating factors: sitting still, laying on her side (20 minutes at most, preferred position),  Relieving factors: laying on her back, topical creams, Prednisone , medication  PRECAUTIONS: None  RED FLAGS: None   WEIGHT BEARING RESTRICTIONS: No  FALLS:  Has patient fallen in last 6 months? No  LIVING ENVIRONMENT: Lives with: lives with their daughter Lives in: House/apartment Stairs: Yes: External: 2 steps; on right going up Has following equipment at home: None  OCCUPATION: works from home  PLOF: Independent  PATIENT GOALS: reduced pain, sit and stand longer (approximately 30 minutes currently), lifting her daughter  NEXT MD VISIT: 10/29/23  OBJECTIVE:  Note: Objective measures were completed at Evaluation unless otherwise noted.  DIAGNOSTIC FINDINGS:  XRs of the lumbar spine from 09/03/2023 were independently reviewed and interpreted, showing disc height loss with anterior osteophyte formation at L5/S1.  No evidence of instability on flexion/extension views.  No fracture or dislocation seen.   PATIENT SURVEYS:  Modified Oswestry 34% disability   COGNITION: Overall cognitive status: Within functional limits for tasks assessed     SENSATION: Patient reports no numbness or tingling  PALPATION: TTP: bilateral lumbar paraspinals, QL,  and right gluteals Increased tenderness to palpation along right  lumbar musculature  LUMBAR JOINT MOBILITY:  L1-2: hypomobile and familiar pain  L2-5: WFL and nonpainful  L5-S1: hypomobile and familiar pain  LUMBAR ROM:   AROM eval  Flexion WFL; "pulling"   Extension 14  Right lateral flexion WFL; "pulling in mid back'   Left lateral flexion WFL   Right rotation 50% limited  Left rotation 25% limited   (Blank rows = not tested)  LOWER EXTREMITY ROM: WFL for activities  LOWER EXTREMITY MMT:    MMT Right eval Left eval  Hip flexion 4/5 4/5  Hip extension    Hip abduction    Hip adduction    Hip internal rotation    Hip external rotation    Knee flexion 5/5 5/5  Knee extension 4/5 4/5  Ankle dorsiflexion    Ankle plantarflexion    Ankle inversion    Ankle eversion     (Blank rows = not tested)  GAIT: Assistive device utilized: None Level of assistance: Complete Independence Comments: no significant gait deviations observed  TREATMENT DATE:                                                                                                                                11/06/23    EXERCISE LOG  Exercise Repetitions and Resistance Comments  Nustep  L4 x 16 minutes   Rockerboard 5 mins   Forward Step Ups 6" box x 3 mins alternating   Resisted pull down  Blue x 3 minutes   Seated dead lift    LAQ 4# x 30 reps bil   Standing HS curl  5# x 3 x 10 reps each    Standing hip ABD  2# 2 x 15 reps each   Standing hip Extension 2# 2 x 15 reps each   Horizontal ABD  Green t-band x 2 x 15 reps     Blank cell = exercise not performed today    PATIENT EDUCATION:  Education details: POC, healing, objective findings, x-ray results, and goals for physical therapy Person educated: Patient Education method: Explanation Education comprehension: verbalized understanding  HOME EXERCISE PROGRAM:   ASSESSMENT:  CLINICAL IMPRESSION: Pt arrives for today's treatment session  reporting 3/10 low back pain.  Pt states that her back feels approximately 30% better since beginning physical therapy.  Pt able to decrease ODI score to 28, making good progress towards her goal.  Pt states that she is able to sleeping 7 ours an average of 4 days a week and when she picks up her child her back pain is approximately 5/10.  Pt has made progress towards all her goals at this time.  Pt encouraged to call the facility with any questions or concerns.  Pt reports "feeling good" at completion of today's treatment session.  Pt ready for discharge at this time.   PHYSICAL THERAPY DISCHARGE SUMMARY  Visits from Start of Care: 5  Current functional level related to goals /  functional outcomes: Patient was able to partially meet her goals for skilled physical therapy. However, she continues to experience elevated pain levels which limit her functional mobility.    Remaining deficits: Pain   Education / Equipment: HEP    Patient agrees to discharge. Patient goals were partially met. Patient is being discharged due to maximized rehab potential.   Glendora Landsman, PT, DPT    OBJECTIVE IMPAIRMENTS: decreased activity tolerance, decreased mobility, difficulty walking, decreased ROM, decreased strength, hypomobility, impaired tone, and pain.   ACTIVITY LIMITATIONS: lifting, bending, sitting, standing, sleeping, and locomotion level  PARTICIPATION LIMITATIONS: meal prep, cleaning, laundry, and community activity  PERSONAL FACTORS: Time since onset of injury/illness/exacerbation and 3+ comorbidities: Hypertension, allergies, diabetes type 2, obesity, depression, anxiety, and current smoker are also affecting patient's functional outcome.   REHAB POTENTIAL: Good  CLINICAL DECISION MAKING: Evolving/moderate complexity  EVALUATION COMPLEXITY: Moderate   GOALS: Goals reviewed with patient? Yes  LONG TERM GOALS: Target date: 10/08/23  Patient will be independent with her HEP.  Baseline:   Goal status: MET  2.  Patient will be able pick up her daughter without her familiar pain exceeding 2/10.  Baseline: 5/27: 5/10  Goal status: IN PROGRESS  3.  Patient will report being able to sleep at least 7 hours without being awakened by her familiar symptoms.  Baseline: 5/27: 4 out of 7 nights a week Goal status: IN PROGRESS  4.  Patient will improved her ODI score to 24% disability or less for improved perceived function with her daily activities.  Baseline: 5/27: 28% Goal status: IN PROGRESS  PLAN:  PT FREQUENCY: 2x/week  PT DURATION: 4 weeks  PLANNED INTERVENTIONS: 97164- PT Re-evaluation, 97110-Therapeutic exercises, 97530- Therapeutic activity, 97112- Neuromuscular re-education, 97535- Self Care, 16109- Manual therapy, Patient/Family education, Balance training, Joint mobilization, and Spinal mobilization.  PLAN FOR NEXT SESSION: Nustep, lumbar and lower extremity strengthening, and manual therapy   Deryl Flora, PTA 11/06/2023, 8:51 AM

## 2023-11-07 ENCOUNTER — Telehealth: Payer: Self-pay | Admitting: Orthopedic Surgery

## 2023-11-07 NOTE — Telephone Encounter (Signed)
 Orthopedic Surgery Note  Patient has now done over six weeks of physical therapy without any relief of her lower back pain, so ordered an MRI of the lumbar spine to evaluate further. Her first PT session was on 09/10/2023 and her most recent was on 11/06/2023. She should follow up in the office after the MRI is completed to discuss the results and next steps in treatment.   Diedra Fowler, MD Orthopedic Surgeon

## 2023-11-09 ENCOUNTER — Encounter: Payer: Self-pay | Admitting: Family Medicine

## 2023-11-09 ENCOUNTER — Ambulatory Visit: Admitting: Family Medicine

## 2023-11-09 VITALS — BP 158/99 | HR 84 | Temp 97.4°F | Ht 66.0 in | Wt 241.0 lb

## 2023-11-09 DIAGNOSIS — I7300 Raynaud's syndrome without gangrene: Secondary | ICD-10-CM

## 2023-11-09 DIAGNOSIS — Z7985 Long-term (current) use of injectable non-insulin antidiabetic drugs: Secondary | ICD-10-CM

## 2023-11-09 DIAGNOSIS — I152 Hypertension secondary to endocrine disorders: Secondary | ICD-10-CM | POA: Diagnosis not present

## 2023-11-09 DIAGNOSIS — E1159 Type 2 diabetes mellitus with other circulatory complications: Secondary | ICD-10-CM
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
## 2023-11-09 NOTE — Patient Instructions (Signed)
 Goal BP:  For patients younger than 60: Goal BP < 140/90. For patients 60 and older: Goal BP < 150/90. For patients with diabetes: Goal BP < 140/90.  Take your medications faithfully as prescribed. Maintain a healthy weight. Get at least 150 minutes of aerobic exercise per week. Minimize salt intake, less than 2000 mg per day. Minimize alcohol intake.  DASH Eating Plan DASH stands for "Dietary Approaches to Stop Hypertension." The DASH eating plan is a healthy eating plan that has been shown to reduce high blood pressure (hypertension). Additional health benefits may include reducing the risk of type 2 diabetes mellitus, heart disease, and stroke. The DASH eating plan may also help with weight loss.  WHAT DO I NEED TO KNOW ABOUT THE DASH EATING PLAN? For the DASH eating plan, you will follow these general guidelines: Choose foods with a percent daily value for sodium of less than 5% (as listed on the food label). Use salt-free seasonings or herbs instead of table salt or sea salt. Check with your health care provider or pharmacist before using salt substitutes. Eat lower-sodium products, often labeled as "lower sodium" or "no salt added." Eat fresh foods. Eat more vegetables, fruits, and low-fat dairy products. Choose whole grains. Look for the word "whole" as the first word in the ingredient list. Choose fish and skinless chicken or [place] more often than red meat. Limit fish, poultry, and meat to 6 oz (170 g) each day. Limit sweets, desserts, sugars, and sugary drinks. Choose heart-healthy fats. Limit cheese to 1 oz (28 g) per day. Eat more home-cooked food and less restaurant, buffet, and fast food. Limit fried foods. Cook foods using methods other than frying. Limit canned vegetables. If you do use them, rinse them well to decrease the sodium. When eating at a restaurant, ask that your food be prepared with less salt, or no salt if possible.  WHAT FOODS CAN I EAT? Seek help from  a dietitian for individual calorie needs.  Grains Whole grain or whole wheat bread. Brown rice. Whole grain or whole wheat pasta. Quinoa, bulgur, and whole grain cereals. Low-sodium cereals. Corn or whole wheat flour tortillas. Whole grain cornbread. Whole grain crackers. Low-sodium crackers.  Vegetables Fresh or frozen vegetables (raw, steamed, roasted, or grilled). Low-sodium or reduced-sodium tomato and vegetable juices. Low-sodium or reduced-sodium tomato sauce and paste. Low-sodium or reduced-sodium canned vegetables.   Fruits All fresh, canned (in natural juice), or frozen fruits.  Meat and Other Protein Products Ground beef (85% or leaner), grass-fed beef, or beef trimmed of fat. Skinless chicken or [place]. Ground chicken or [place]. Pork trimmed of fat. All fish and seafood. Eggs. Dried beans, peas, or lentils. Unsalted nuts and seeds. Unsalted canned beans.  Dairy Low-fat dairy products, such as skim or 1% milk, 2% or reduced-fat cheeses, low-fat ricotta or cottage cheese, or plain low-fat yogurt. Low-sodium or reduced-sodium cheeses.  Fats and Oils Tub margarines without trans fats. Light or reduced-fat mayonnaise and salad dressings (reduced sodium). Avocado. Safflower, olive, or canola oils. Natural peanut or almond butter.  Other Unsalted popcorn and pretzels. The items listed above may not be a complete list of recommended foods or beverages. Contact your dietitian for more options.  WHAT FOODS ARE NOT RECOMMENDED?  Grains White bread. White pasta. White rice. Refined cornbread. Bagels and croissants. Crackers that contain trans fat.  Vegetables Creamed or fried vegetables. Vegetables in a cheese sauce. Regular canned vegetables. Regular canned tomato sauce and paste. Regular tomato and vegetable juices.  Fruits Dried fruits. Canned fruit in light or heavy syrup. Fruit juice.  Meat and Other Protein Products Fatty cuts of meat. Ribs, chicken wings, bacon, sausage,  bologna, salami, chitterlings, fatback, hot dogs, bratwurst, and packaged luncheon meats. Salted nuts and seeds. Canned beans with salt.  Dairy Whole or 2% milk, cream, half-and-half, and cream cheese. Whole-fat or sweetened yogurt. Full-fat cheeses or blue cheese. Nondairy creamers and whipped toppings. Processed cheese, cheese spreads, or cheese curds.  Condiments Onion and garlic salt, seasoned salt, table salt, and sea salt. Canned and packaged gravies. Worcestershire sauce. Tartar sauce. Barbecue sauce. Teriyaki sauce. Soy sauce, including reduced sodium. Steak sauce. Fish sauce. Oyster sauce. Cocktail sauce. Horseradish. Ketchup and mustard. Meat flavorings and tenderizers. Bouillon cubes. Hot sauce. Tabasco sauce. Marinades. Taco seasonings. Relishes.  Fats and Oils Butter, stick margarine, lard, shortening, ghee, and bacon fat. Coconut, palm kernel, or palm oils. Regular salad dressings.  Other Pickles and olives. Salted popcorn and pretzels.  The items listed above may not be a complete list of foods and beverages to avoid. Contact your dietitian for more information.  WHERE CAN I FIND MORE INFORMATION? National Heart, Lung, and Blood Institute: [email or web address] Document Released: 05/18/2011 Document Revised: 10/13/2013 Document Reviewed: 04/02/2013 [facility] Patient Information 2015 [place], [place]. This information is not intended to replace advice given to you by your health care provider. Make sure you discuss any questions you have with your health care provider.   I think that you would greatly benefit from seeing a nutritionist.  If you are interested, please call Dr. Gerilyn Pilgrim at [phone] to schedule an appointment.

## 2023-11-16 ENCOUNTER — Ambulatory Visit (HOSPITAL_BASED_OUTPATIENT_CLINIC_OR_DEPARTMENT_OTHER)
Admission: RE | Admit: 2023-11-16 | Discharge: 2023-11-16 | Disposition: A | Discharge status: Home / Self Care | Source: Ambulatory Visit | Attending: Orthopedic Surgery | Admitting: Orthopedic Surgery

## 2023-11-16 DIAGNOSIS — M545 Low back pain, unspecified: Secondary | ICD-10-CM | POA: Diagnosis not present

## 2023-11-22 ENCOUNTER — Encounter: Payer: Self-pay | Admitting: Family Medicine

## 2023-11-22 ENCOUNTER — Ambulatory Visit: Admitting: Family Medicine

## 2023-11-22 VITALS — BP 135/89 | HR 81 | Temp 98.4°F | Ht 66.0 in | Wt 245.0 lb

## 2023-11-22 DIAGNOSIS — R400 Somnolence: Secondary | ICD-10-CM | POA: Diagnosis not present

## 2023-11-22 DIAGNOSIS — F411 Generalized anxiety disorder: Secondary | ICD-10-CM

## 2023-11-22 DIAGNOSIS — E119 Type 2 diabetes mellitus without complications: Secondary | ICD-10-CM | POA: Diagnosis not present

## 2023-11-22 DIAGNOSIS — E559 Vitamin D deficiency, unspecified: Secondary | ICD-10-CM

## 2023-11-22 DIAGNOSIS — F339 Major depressive disorder, recurrent, unspecified: Secondary | ICD-10-CM

## 2023-11-22 DIAGNOSIS — R252 Cramp and spasm: Secondary | ICD-10-CM

## 2023-11-22 MED ORDER — VITAMIN D (ERGOCALCIFEROL) 1.25 MG (50000 UNIT) PO CAPS: 50000.0000 [IU] | ORAL_CAPSULE | ORAL | 0 refills | Status: AC

## 2023-11-22 NOTE — Progress Notes (Signed)
 Subjective:  Patient ID: LOYDA COSTIN, female    DOB: 1991-01-22, 33 y.o.   MRN: [id number]  Patient Care Team: Galvin Jules, FNP as PCP - General (Family Medicine)   Chief Complaint:  Fatigue (Sore muscles)   HPI: Shantice Melven Stable Plath is a 33 y.o. female presenting on 11/22/2023 for Fatigue (Sore muscles)  HPI States that she has been having muscle pain/cramping for 3 days. Reports that it is in her back and back of her thighs. Also in her shoulders. Nothing that she knows makes it better or worse. Tries hot showers and sleeping on her back, did not help. Has been taking tricor  for a while, but recently started lipitor. States that she has been more fatigued than normal as well.  States that she is not eating very much and is falling asleep during calls at work during the day. She is taking mounjaro  and notes that her BG is running between 60 and 70 at home.  Relevant past medical, surgical, family, and social history reviewed and updated as indicated.  Allergies and medications reviewed and updated. Data reviewed: Chart in Epic.   Past Medical History:  Diagnosis Date   Ankle fracture, left 2000   Anxiety    Arthritis    left wrist and left ankle   Asthma    Back pain    Back pain    Bronchitis    Depression    Diabetes (HCC) 01/11/2016   type II  history of   Family history of adverse reaction to anesthesia    gmother had problems with N/V    GERD (gastroesophageal reflux disease)    Headache    High cholesterol    History of kidney stones    HLD (hyperlipidemia)    HSV-2 infection    Hypertension 2013   Intracranial hypertension    psuedotumor cebrum   Joint pain    Joint pain    Kienbock's disease    Leg edema    OSA (obstructive sleep apnea)    cpap - does not know settings    Papilledema    Prediabetes    Pregnancy induced hypertension    Pseudotumor cerebri syndrome    RLS (restless legs syndrome)    Sinus tachycardia    SOB (shortness of breath)      Past Surgical History:  Procedure Laterality Date   CESAREAN SECTION N/A 07/17/2019   Procedure: CESAREAN SECTION;  Surgeon: Renea Carrion, MD;  Location: [facility];  Service: Obstetrics;  Laterality: N/A;   FRACTURE SURGERY  2005   ankle   FRACTURE SURGERY  2013   wrist (deteriorating bone)   LAPAROSCOPIC GASTRIC SLEEVE RESECTION N/A 05/08/2017   Procedure: LAPAROSCOPIC GASTRIC SLEEVE RESECTION;  Surgeon: Adalberto Acton, MD;  Location: [facility];  Service: General;  Laterality: N/A;   LAPAROSCOPIC GASTRIC SLEEVE RESECTION  05/08/2017   UPPER GI ENDOSCOPY  05/08/2017   Procedure: UPPER GI ENDOSCOPY;  Surgeon: Adalberto Acton, MD;  Location: [facility];  Service: General;;   urethra stretch     WRIST SURGERY  2015    Social History   Socioeconomic History   Marital status: Married    Spouse name: Denzil Flatten   Number of children: 0   Years of education: 12+   Highest education level: Some college, no degree  Occupational History   Occupation: Engineer, drilling: [employer]  Tobacco Use   Smoking status: Every Day    Current packs/day:  0.00    Types: Cigarettes    Last attempt to quit: 10/05/2018    Years since quitting: 5.1   Smokeless tobacco: Never  Vaping Use   Vaping status: Never Used  Substance and Sexual Activity   Alcohol use: Not Currently   Drug use: Not Currently    Types: Marijuana   Sexual activity: Yes    Birth control/protection: I.U.D.    Comment: condoms previously  Other Topics Concern   Not on file  Social History Narrative   Lives with grandparents   Caffeine  use: Drinks coffee/tea/soda- 20oz per day (none after starting diamox )   Social Drivers of Health   Financial Resource Strain: Medium Risk (09/27/2023)   Overall Financial Resource Strain (CARDIA)    Difficulty of Paying Living Expenses: Somewhat hard  Food Insecurity: Food Insecurity Present (09/27/2023)   Hunger Vital Sign    Worried About Running Out of Food in the Last Year: Sometimes true     Ran Out of Food in the Last Year: Sometimes true  Transportation Needs: No Transportation Needs (09/27/2023)   PRAPARE - Administrator, Civil Service (Medical): No    Lack of Transportation (Non-Medical): No  Physical Activity: Unknown (09/27/2023)   Exercise Vital Sign    Days of Exercise per Week: 0 days    Minutes of Exercise per Session: Not on file  Stress: Stress Concern Present (09/27/2023)   [employer] of Occupational Health - Occupational Stress Questionnaire    Feeling of Stress : Very much  Social Connections: Moderately Isolated (09/27/2023)   Social Connection and Isolation Panel    Frequency of Communication with Friends and Family: More than three times a week    Frequency of Social Gatherings with Friends and Family: Patient declined    Attends Religious Services: 1 to 4 times per year    Active Member of [employer] or Organizations: No    Attends Engineer, structural: Not on file    Marital Status: Separated  Intimate Partner Violence: Unknown (02/19/2023)   Received from [facility]   HITS    Physically Hurt: Not on file    Insult or Talk Down To: Not on file    Threaten Physical Harm: Not on file    Scream or Curse: Not on file    Outpatient Encounter Medications as of 11/22/2023  Medication Sig   albuterol  (PROVENTIL ) (2.5 MG/3ML) 0.083% nebulizer solution Take 3 mLs (2.5 mg total) by nebulization every 4 (four) hours as needed for wheezing or shortness of breath (coughing fits).   amLODipine  (NORVASC ) 5 MG tablet Take 1 tablet (5 mg total) by mouth daily.   atorvastatin  (LIPITOR) 40 MG tablet Take 1 tablet (40 mg total) by mouth daily.   azelastine  (ASTELIN ) 0.1 % nasal spray Place 1 spray into both nostrils daily as needed for rhinitis. Use in each nostril as directed   Budeson-Glycopyrrol-Formoterol  (BREZTRI  AEROSPHERE) 160-9-4.8 MCG/ACT AERO Inhale 2 puffs into the lungs in the morning and at bedtime. with spacer and rinse mouth  afterwards.   cromolyn  (OPTICROM ) 4 % ophthalmic solution Place 1 drop into both eyes 4 (four) times daily as needed (itchy/watery eyes).   famotidine  (PEPCID ) 20 MG tablet Take by mouth.   FASENRA  PEN 30 MG/ML prefilled autoinjector INJECT 1 PEN UNDER THE SKIN EVERY 8 WEEKS   fenofibrate  (TRICOR ) 145 MG tablet Take 1 tablet (145 mg total) by mouth daily.   fluticasone  (FLONASE ) 50 MCG/ACT nasal spray Place 1 spray into both  nostrils 2 (two) times daily as needed (nasal congestion).   levocetirizine (XYZAL ) 5 MG tablet Take 1 tablet (5 mg total) by mouth every evening.   levonorgestrel  (MIRENA , 52 MG,) 20 MCG/24HR IUD Mirena  20 mcg/24 hours (6 yrs) 52 mg intrauterine device  Take 1 device by intrauterine route.   lisinopril -hydrochlorothiazide  (ZESTORETIC ) 20-25 MG tablet Take 1 tablet by mouth daily.   omeprazole  (PRILOSEC) 20 MG capsule Take 1 capsule (20 mg total) by mouth daily.   tirzepatide  (MOUNJARO ) 15 MG/0.5ML Pen INJECT 15 MG INTO THE SKIN ONCE A WEEK.   VENTOLIN  HFA 108 (90 Base) MCG/ACT inhaler Inhale 2 puffs into the lungs every 4 (four) hours as needed for wheezing or shortness of breath.   NIFEdipine  (ADALAT  CC) 60 MG 24 hr tablet Take 60 mg by mouth every morning.   [DISCONTINUED] cetirizine (ZYRTEC) 10 MG tablet Take 10 mg by mouth daily.   No facility-administered encounter medications on file as of 11/22/2023.    Allergies  Allergen Reactions   Cats Claw (Uncaria Tomentosa) Itching, Shortness Of Breath and Swelling   Dust Mite Extract Itching and Shortness Of Breath   Grass Pollen(K-O-R-T-Swt Vern) Itching, Shortness Of Breath and Other (See Comments)   Mixed Feathers Itching, Shortness Of Breath and Swelling   Uncaria Tomentosa (Cats Claw) Itching and Swelling   Cat Dander Other (See Comments)   Gramineae Pollens Other (See Comments), Cough and Itching    POLLEN EXTRACTS   Lactose Other (See Comments)    lactose   Other Other (See Comments)   Pollen Extract Other  (See Comments), Cough and Itching    POLLEN EXTRACTS    Review of Systems As per HPI  Objective:  Temp 98.4 F (36.9 C)   Ht 5' 6 (1.676 m)   Wt 245 lb (111.1 kg)   SpO2 99%   BMI 39.54 kg/m    Wt Readings from Last 3 Encounters:  11/22/23 245 lb (111.1 kg)  11/09/23 241 lb (109.3 kg)  09/27/23 249 lb 6.4 oz (113.1 kg)    Physical Exam Constitutional:      General: She is awake. She is not in acute distress.    Appearance: Normal appearance. She is well-developed and well-groomed. She is obese. She is not ill-appearing, toxic-appearing or diaphoretic.   Cardiovascular:     Rate and Rhythm: Normal rate and regular rhythm.     Pulses: Normal pulses.          Radial pulses are 2+ on the right side and 2+ on the left side.       Posterior tibial pulses are 2+ on the right side and 2+ on the left side.     Heart sounds: Normal heart sounds. No murmur heard.    No gallop.     Comments: Nonpitting  Pulmonary:     Effort: Pulmonary effort is normal. No respiratory distress.     Breath sounds: Normal breath sounds. No stridor. No wheezing, rhonchi or rales.   Musculoskeletal:     Cervical back: Full passive range of motion without pain and neck supple.     Right lower leg: Edema present.     Left lower leg: Edema present.   Skin:    General: Skin is warm.     Capillary Refill: Capillary refill takes less than 2 seconds.   Neurological:     General: No focal deficit present.     Mental Status: She is alert, oriented to person, place, and time and easily  aroused. Mental status is at baseline.     GCS: GCS eye subscore is 4. GCS verbal subscore is 5. GCS motor subscore is 6.     Motor: No weakness.   Psychiatric:        Attention and Perception: Attention and perception normal.        Mood and Affect: Mood and affect normal.        Speech: Speech normal.        Behavior: Behavior normal. Behavior is cooperative.        Thought Content: Thought content normal. Thought  content does not include homicidal or suicidal ideation. Thought content does not include homicidal or suicidal plan.        Cognition and Memory: Cognition and memory normal.        Judgment: Judgment normal.     Results for orders placed or performed in visit on 08/28/23  Bayer DCA Hb A1c Waived   Collection Time: 08/28/23 11:20 AM  Result Value Ref Range   HB A1C (BAYER DCA - WAIVED) 4.8 4.8 - 5.6 %  Lipid panel   Collection Time: 08/28/23 11:25 AM  Result Value Ref Range   Cholesterol, Total 192 100 - 199 mg/dL   Triglycerides 94 0 - 149 mg/dL   HDL 40 >16 mg/dL   VLDL Cholesterol Cal 17 5 - 40 mg/dL   LDL Chol Calc (NIH) 109 (H) 0 - 99 mg/dL   Chol/HDL Ratio 4.8 (H) 0.0 - 4.4 ratio  CBC with Differential/Platelet   Collection Time: 08/28/23 11:25 AM  Result Value Ref Range   WBC 8.1 3.4 - 10.8 x10E3/uL   RBC 5.20 3.77 - 5.28 x10E6/uL   Hemoglobin 14.7 11.1 - 15.9 g/dL   Hematocrit 60.4 54.0 - 46.6 %   MCV 86 79 - 97 fL   MCH 28.3 26.6 - 33.0 pg   MCHC 32.7 31.5 - 35.7 g/dL   RDW 98.1 19.1 - 47.8 %   Platelets 265 150 - 450 x10E3/uL   Neutrophils 65 Not Estab. %   Lymphs 29 Not Estab. %   Monocytes 6 Not Estab. %   Eos 0 Not Estab. %   Basos 0 Not Estab. %   Neutrophils Absolute 5.2 1.4 - 7.0 x10E3/uL   Lymphocytes Absolute 2.4 0.7 - 3.1 x10E3/uL   Monocytes Absolute 0.5 0.1 - 0.9 x10E3/uL   EOS (ABSOLUTE) 0.0 0.0 - 0.4 x10E3/uL   Basophils Absolute 0.0 0.0 - 0.2 x10E3/uL   Immature Granulocytes 0 Not Estab. %   Immature Grans (Abs) 0.0 0.0 - 0.1 x10E3/uL  CMP14+EGFR   Collection Time: 08/28/23 11:25 AM  Result Value Ref Range   Glucose 68 (L) 70 - 99 mg/dL   BUN 12 6 - 20 mg/dL   Creatinine, Ser 2.95 0.57 - 1.00 mg/dL   eGFR 621 >30 QM/VHQ/4.69   BUN/Creatinine Ratio 19 9 - 23   Sodium 142 134 - 144 mmol/L   Potassium 4.1 3.5 - 5.2 mmol/L   Chloride 101 96 - 106 mmol/L   CO2 23 20 - 29 mmol/L   Calcium  9.3 8.7 - 10.2 mg/dL   Total Protein 6.9 6.0 - 8.5  g/dL   Albumin 4.6 3.9 - 4.9 g/dL   Globulin, Total 2.3 1.5 - 4.5 g/dL   Bilirubin Total 1.3 (H) 0.0 - 1.2 mg/dL   Alkaline Phosphatase 63 44 - 121 IU/L   AST 14 0 - 40 IU/L   ALT 10 0 - 32 IU/L  Thyroid  Panel With TSH   Collection Time: 08/28/23 11:25 AM  Result Value Ref Range   TSH 1.540 0.450 - 4.500 uIU/mL   T4, Total 10.2 4.5 - 12.0 ug/dL   T3 Uptake Ratio 32 24 - 39 %   Free Thyroxine Index 3.3 1.2 - 4.9  VITAMIN D  25 Hydroxy (Vit-D Deficiency, Fractures)   Collection Time: 08/28/23 11:25 AM  Result Value Ref Range   Vit D, 25-Hydroxy 21.6 (L) 30.0 - 100.0 ng/mL  Microalbumin / creatinine urine ratio   Collection Time: 08/28/23 11:38 AM  Result Value Ref Range   Creatinine, Urine 108.6 Not Estab. mg/dL   Microalbumin, Urine 04.5 Not Estab. ug/mL   Microalb/Creat Ratio 14 0 - 29 mg/g creat       11/09/2023    3:02 PM 09/27/2023    3:32 PM 08/28/2023   11:09 AM 05/22/2023   12:29 PM 02/01/2023    2:13 PM  Depression screen PHQ 2/9  Decreased Interest 2 2 2 2 3   Down, Depressed, Hopeless 3 2 2 2 2   PHQ - 2 Score 5 4 4 4 5   Altered sleeping 3 2 3 2 1   Tired, decreased energy 3 2 3 3 2   Change in appetite 3 2 2 2 2   Feeling bad or failure about yourself  0 0 1 2 0  Trouble concentrating 2 2 2 2 2   Moving slowly or fidgety/restless 2 2 2 2 2   Suicidal thoughts 0 0 0 0 0  PHQ-9 Score 18 14 17 17 14   Difficult doing work/chores Very difficult Somewhat difficult Somewhat difficult Somewhat difficult Very difficult       11/09/2023    3:03 PM 09/27/2023    3:33 PM 08/28/2023   11:09 AM 05/22/2023   12:29 PM  GAD 7 : Generalized Anxiety Score  Nervous, Anxious, on Edge 3 2 3 3   Control/stop worrying 3 2 3 3   Worry too much - different things 3 2 3 3   Trouble relaxing 3 2 3 3   Restless 3 2 3 3   Easily annoyed or irritable 3 2 3 3   Afraid - awful might happen 0 2 1 1   Total GAD 7 Score 18 14 19 19   Anxiety Difficulty Very difficult Somewhat difficult Somewhat  difficult Somewhat difficult   Pertinent labs & imaging results that were available during my care of the patient were reviewed by me and considered in my medical decision making.  Assessment & Plan:  Cimone was seen today for fatigue.  Diagnoses and all orders for this visit:  1. Daytime sleepiness (Primary) Will refer back to neurology. Patient has been seen previously but was lost to follow up. Discussed with patient that symptoms may be multifactorial due to hypoglycemia, vitamin D  deficiency or additional cause. Patient verbalized understanding. Encouraged her to increase healthy proteins and fats in diet. Will supplement vitamin D  as well.   - Ambulatory referral to Neurology  2. Muscle cramping As above. In addition, symptoms may be due to addition of lipitor with fenofibrate . Patient to hold lipitor for a few days to see if symptoms improve. If so, can consider starting back at lower dose with crestor.  - Ambulatory referral to Neurology  3. Depression, recurrent (HCC) Stable. Denies SI. Patient to follow up with PCP.   4. GAD (generalized anxiety disorder) As above.   5. Controlled type 2 diabetes mellitus without complication, without long-term current use of insulin  (HCC) As above.   6. Vitamin D   deficiency As above.  - Vitamin D , Ergocalciferol , (DRISDOL ) 1.25 MG (50000 UNIT) CAPS capsule; Take 1 capsule (50,000 Units total) by mouth every 7 (seven) days for 5 doses.  Dispense: 5 capsule; Refill: 0   Continue all other maintenance medications.  Follow up plan: Return if symptoms worsen or fail to improve.   Continue healthy lifestyle choices, including diet (rich in fruits, vegetables, and lean proteins, and low in salt and simple carbohydrates) and exercise (at least 30 minutes of moderate physical activity daily).  Written and verbal instructions provided   The above assessment and management plan was discussed with the patient. The patient verbalized  understanding of and has agreed to the management plan. Patient is aware to call the clinic if they develop any new symptoms or if symptoms persist or worsen. Patient is aware when to return to the clinic for a follow-up visit. Patient educated on when it is appropriate to go to the emergency department.   Jacqualyn Mates, DNP-FNP [facility] [address] [place], [place] [address] [phone]

## 2023-11-23 ENCOUNTER — Ambulatory Visit: Admitting: Family Medicine

## 2023-11-30 ENCOUNTER — Telehealth: Payer: Self-pay

## 2023-11-30 ENCOUNTER — Other Ambulatory Visit (HOSPITAL_COMMUNITY): Payer: Self-pay

## 2023-11-30 NOTE — Telephone Encounter (Signed)
 Pharmacy Patient Advocate Encounter   Received notification from CoverMyMeds that prior authorization for Vraylar  1.5 caps is required/requested.   Insurance verification completed.   The patient is insured through [facility] .   Per test claim: The current 30 day co-pay is, $4.00.  No PA needed at this time. This test claim was processed through [facility]- copay amounts may vary at other pharmacies due to pharmacy/plan contracts, or as the patient moves through the different stages of their insurance plan.

## 2023-11-30 NOTE — Telephone Encounter (Signed)
 Mychart message sent regarding PA. LS

## 2023-12-07 ENCOUNTER — Encounter (HOSPITAL_COMMUNITY): Payer: Self-pay | Admitting: *Deleted

## 2023-12-10 ENCOUNTER — Encounter: Payer: Self-pay | Admitting: Family Medicine

## 2023-12-11 ENCOUNTER — Encounter: Payer: Self-pay | Admitting: Orthopedic Surgery

## 2023-12-11 NOTE — Telephone Encounter (Signed)
 LMTCB to pay fee $29.00 for FMLA forms.

## 2023-12-13 ENCOUNTER — Ambulatory Visit: Admitting: Orthopedic Surgery

## 2023-12-13 DIAGNOSIS — M545 Low back pain, unspecified: Secondary | ICD-10-CM | POA: Diagnosis not present

## 2023-12-13 NOTE — Progress Notes (Signed)
 Orthopedic Spine Surgery Office Note   Assessment: Patient is a 33 y.o. female with low back pain. No radicular symptoms. Has a degenerative disc at L5/S1      Plan: -Patient has tried PT, lidocaine  patches, chiropractor, tylenol , prednisone , methocarbamol   -If her pain returns to how it was, would try a Medrol  Dosepak again and L5/S1 facet injections.  Patient will contact me if her pain returns -Encouraged her to work on weight loss and core strengthening for the long term health of her back -Would need to be nicotine free prior to any elective spine surgery -Patient should return to office on an as needed basis     ___________________________________________________________________________     History:   Patient is a 33 y.o. female who presents today for follow up on her lumbar spine.  Patient states that she is doing better since she was first seen in the office but still does have back pain.  She will have episodes where she has significant lower back pain that last typically about 3 days.  She has not had the constant severe back pain that she was experiencing back in March when I saw her first.  She still does not have any pain radiating to either lower extremity.   Treatments tried: PT, lidocaine  patches, chiropractor, tylenol , methocarbamol , prednisone      Physical Exam:   General: no acute distress, appears stated age Neurologic: alert, answering questions appropriately, following commands Respiratory: unlabored breathing on room air, symmetric chest rise Psychiatric: appropriate affect, normal cadence to speech     MSK (spine):   -Strength exam                                                   Left                  Right EHL                              5/5                  5/5 TA                                 5/5                  5/5 GSC                             5/5                  5/5 Knee extension            5/5                  5/5 Hip flexion                     5/5                  5/5   -Sensory exam                           Sensation intact to light touch in  L3-S1 nerve distributions of bilateral lower extremities   Imaging: XRs of the lumbar spine from 09/03/2023 were previously independently reviewed and interpreted, showing disc height loss with anterior osteophyte formation at L5/S1.  No evidence of instability on flexion/extension views.  No fracture or dislocation seen.  MRI of the lumbar spine from 11/16/2023 was independently reviewed and interpreted, showing degenerative disc at L5/S1 with Modic changes.  Slight increase in T2 signal within the L5/S1 facet joints.  Disc desiccation at L3/4 and L4/5.  No significant stenosis seen centrally, in the lateral recess, or foraminally.      Patient name: Cassandra Tucker Patient MRN: [id number] Date of visit: 12/13/23

## 2023-12-13 NOTE — Telephone Encounter (Signed)
 LMTCB again to pay fee so that I can work on her FMLA paperwork

## 2023-12-20 ENCOUNTER — Ambulatory Visit: Admitting: Family Medicine

## 2023-12-20 ENCOUNTER — Encounter: Payer: Self-pay | Admitting: Family Medicine

## 2023-12-20 ENCOUNTER — Ambulatory Visit: Payer: Self-pay | Admitting: Family Medicine

## 2023-12-20 VITALS — BP 137/89 | HR 82 | Temp 97.9°F | Ht 66.0 in | Wt 243.0 lb

## 2023-12-20 DIAGNOSIS — E119 Type 2 diabetes mellitus without complications: Secondary | ICD-10-CM | POA: Diagnosis not present

## 2023-12-20 DIAGNOSIS — K581 Irritable bowel syndrome with constipation: Secondary | ICD-10-CM | POA: Diagnosis not present

## 2023-12-20 DIAGNOSIS — Z7985 Long-term (current) use of injectable non-insulin antidiabetic drugs: Secondary | ICD-10-CM | POA: Diagnosis not present

## 2023-12-20 DIAGNOSIS — E1169 Type 2 diabetes mellitus with other specified complication: Secondary | ICD-10-CM

## 2023-12-20 DIAGNOSIS — I152 Hypertension secondary to endocrine disorders: Secondary | ICD-10-CM | POA: Diagnosis not present

## 2023-12-20 DIAGNOSIS — E1159 Type 2 diabetes mellitus with other circulatory complications: Secondary | ICD-10-CM

## 2023-12-20 DIAGNOSIS — E785 Hyperlipidemia, unspecified: Secondary | ICD-10-CM | POA: Diagnosis not present

## 2023-12-20 DIAGNOSIS — G4733 Obstructive sleep apnea (adult) (pediatric): Secondary | ICD-10-CM

## 2023-12-20 LAB — BAYER DCA HB A1C WAIVED: HB A1C (BAYER DCA - WAIVED): 5 % (ref 4.8–5.6)

## 2023-12-20 MED ORDER — LINACLOTIDE 145 MCG PO CAPS
145.0000 ug | ORAL_CAPSULE | Freq: Every day | ORAL | 11 refills | Status: DC
Start: 2023-12-20 — End: 2024-01-17

## 2023-12-20 NOTE — Progress Notes (Signed)
 Subjective:  Patient ID: Cassandra Tucker, female    DOB: 08-11-1990, 33 y.o.   MRN: [id number]  Patient Care Team: Severa Rock CHRISTELLA, FNP as PCP - General (Family Medicine)   Chief Complaint:  Diabetes ( 3 month follow up )   HPI: Cassandra Tucker is a 33 y.o. female presenting on 12/20/2023 for Diabetes ( 3 month follow up )   The patient presents for a follow-up regarding diabetes management and daytime fatigue.  He experiences daytime fatigue and is awaiting contact from neurology to schedule a sleep study to assess for potential sleep apnea.  His diabetes is managed with Mounjaro , and his blood sugars are stable, typically around 82 to 89. He takes vitamin D , cholesterol, and blood pressure medications without issues.  He experiences constipation and uses Linzess  as needed, taking it when he feels the need to have a bowel movement rather than regularly. He has an old prescription for Linzess  and is unsure if his insurance covers it.  In terms of diet, he has not been cooking much since living alone and primarily eats meat without sides. He avoids bread and pasta, which make him feel unwell, and is trying to incorporate more protein and complex carbohydrates into his diet, such as whole grains and vegetables.          Relevant past medical, surgical, family, and social history reviewed and updated as indicated.  Allergies and medications reviewed and updated. Data reviewed: Chart in Epic.   Past Medical History:  Diagnosis Date   Ankle fracture, left 2000   Anxiety    Arthritis    left wrist and left ankle   Asthma    Back pain    Back pain    Bronchitis    Depression    Diabetes (HCC) 01/11/2016   type II  history of   Family history of adverse reaction to anesthesia    gmother had problems with N/V    GERD (gastroesophageal reflux disease)    Headache    High cholesterol    History of kidney stones    HLD (hyperlipidemia)    HSV-2 infection    Hypertension  2013   Intracranial hypertension    psuedotumor cebrum   Joint pain    Joint pain    Kienbock's disease    Leg edema    OSA (obstructive sleep apnea)    cpap - does not know settings    Papilledema    Prediabetes    Pregnancy induced hypertension    Pseudotumor cerebri syndrome    RLS (restless legs syndrome)    Sinus tachycardia    SOB (shortness of breath)     Past Surgical History:  Procedure Laterality Date   CESAREAN SECTION N/A 07/17/2019   Procedure: CESAREAN SECTION;  Surgeon: Henry Slough, MD;  Location: [facility];  Service: Obstetrics;  Laterality: N/A;   FRACTURE SURGERY  2005   ankle   FRACTURE SURGERY  2013   wrist (deteriorating bone)   LAPAROSCOPIC GASTRIC SLEEVE RESECTION N/A 05/08/2017   Procedure: LAPAROSCOPIC GASTRIC SLEEVE RESECTION;  Surgeon: Signe Mitzie LABOR, MD;  Location: [facility];  Service: General;  Laterality: N/A;   LAPAROSCOPIC GASTRIC SLEEVE RESECTION  05/08/2017   UPPER GI ENDOSCOPY  05/08/2017   Procedure: UPPER GI ENDOSCOPY;  Surgeon: Signe Mitzie LABOR, MD;  Location: [facility];  Service: General;;   urethra stretch     WRIST SURGERY  2015    Social History  Socioeconomic History   Marital status: Married    Spouse name: Norah   Number of children: 0   Years of education: 12+   Highest education level: Some college, no degree  Occupational History   Occupation: Engineer, drilling: [employer]  Tobacco Use   Smoking status: Every Day    Current packs/day: 0.00    Types: Cigarettes    Last attempt to quit: 10/05/2018    Years since quitting: 5.2   Smokeless tobacco: Never  Vaping Use   Vaping status: Never Used  Substance and Sexual Activity   Alcohol use: Not Currently   Drug use: Not Currently    Types: Marijuana   Sexual activity: Yes    Birth control/protection: I.U.D.    Comment: condoms previously  Other Topics Concern   Not on file  Social History Narrative   Lives with grandparents   Caffeine  use: Drinks  coffee/tea/soda- 20oz per day (none after starting diamox )   Social Drivers of Health   Financial Resource Strain: Medium Risk (12/19/2023)   Overall Financial Resource Strain (CARDIA)    Difficulty of Paying Living Expenses: Somewhat hard  Food Insecurity: Food Insecurity Present (12/19/2023)   Hunger Vital Sign    Worried About Running Out of Food in the Last Year: Sometimes true    Ran Out of Food in the Last Year: Sometimes true  Transportation Needs: No Transportation Needs (12/19/2023)   PRAPARE - Administrator, Civil Service (Medical): No    Lack of Transportation (Non-Medical): No  Physical Activity: Inactive (12/19/2023)   Exercise Vital Sign    Days of Exercise per Week: 0 days    Minutes of Exercise per Session: Not on file  Stress: Stress Concern Present (12/19/2023)   [employer] of Occupational Health - Occupational Stress Questionnaire    Feeling of Stress: Very much  Social Connections: Socially Isolated (12/19/2023)   Social Connection and Isolation Panel    Frequency of Communication with Friends and Family: More than three times a week    Frequency of Social Gatherings with Friends and Family: Once a week    Attends Religious Services: Never    Database administrator or Organizations: No    Attends Engineer, structural: Not on file    Marital Status: Separated  Intimate Partner Violence: Unknown (02/19/2023)   Received from [facility]   HITS    Physically Hurt: Not on file    Insult or Talk Down To: Not on file    Threaten Physical Harm: Not on file    Scream or Curse: Not on file    Outpatient Encounter Medications as of 12/20/2023  Medication Sig   albuterol  (PROVENTIL ) (2.5 MG/3ML) 0.083% nebulizer solution Take 3 mLs (2.5 mg total) by nebulization every 4 (four) hours as needed for wheezing or shortness of breath (coughing fits).   amLODipine  (NORVASC ) 5 MG tablet Take 1 tablet (5 mg total) by mouth daily.   atorvastatin  (LIPITOR) 40 MG  tablet Take 1 tablet (40 mg total) by mouth daily.   azelastine  (ASTELIN ) 0.1 % nasal spray Place 1 spray into both nostrils daily as needed for rhinitis. Use in each nostril as directed   cromolyn  (OPTICROM ) 4 % ophthalmic solution Place 1 drop into both eyes 4 (four) times daily as needed (itchy/watery eyes).   FASENRA  PEN 30 MG/ML prefilled autoinjector INJECT 1 PEN UNDER THE SKIN EVERY 8 WEEKS   fluticasone  (FLONASE ) 50 MCG/ACT nasal spray Place 1  spray into both nostrils 2 (two) times daily as needed (nasal congestion).   levocetirizine (XYZAL ) 5 MG tablet Take 1 tablet (5 mg total) by mouth every evening.   levonorgestrel  (MIRENA , 52 MG,) 20 MCG/24HR IUD Mirena  20 mcg/24 hours (6 yrs) 52 mg intrauterine device  Take 1 device by intrauterine route.   linaclotide  (LINZESS ) 145 MCG CAPS capsule Take 1 capsule (145 mcg total) by mouth daily before breakfast.   lisinopril -hydrochlorothiazide  (ZESTORETIC ) 20-25 MG tablet Take 1 tablet by mouth daily.   omeprazole  (PRILOSEC) 20 MG capsule Take 1 capsule (20 mg total) by mouth daily.   tirzepatide  (MOUNJARO ) 15 MG/0.5ML Pen INJECT 15 MG INTO THE SKIN ONCE A WEEK.   VENTOLIN  HFA 108 (90 Base) MCG/ACT inhaler Inhale 2 puffs into the lungs every 4 (four) hours as needed for wheezing or shortness of breath.   Vitamin D , Ergocalciferol , (DRISDOL ) 1.25 MG (50000 UNIT) CAPS capsule Take 1 capsule (50,000 Units total) by mouth every 7 (seven) days for 5 doses.   Budeson-Glycopyrrol-Formoterol  (BREZTRI  AEROSPHERE) 160-9-4.8 MCG/ACT AERO Inhale 2 puffs into the lungs in the morning and at bedtime. with spacer and rinse mouth afterwards.   famotidine  (PEPCID ) 20 MG tablet Take by mouth.   NIFEdipine  (ADALAT  CC) 60 MG 24 hr tablet Take 60 mg by mouth every morning.   [DISCONTINUED] cetirizine (ZYRTEC) 10 MG tablet Take 10 mg by mouth daily.   [DISCONTINUED] fenofibrate  (TRICOR ) 145 MG tablet Take 1 tablet (145 mg total) by mouth daily.   No  facility-administered encounter medications on file as of 12/20/2023.    Allergies  Allergen Reactions   Cats Claw (Uncaria Tomentosa) Itching, Shortness Of Breath and Swelling   Dust Mite Extract Itching and Shortness Of Breath   Grass Pollen(K-O-R-T-Swt Vern) Itching, Shortness Of Breath and Other (See Comments)   Mixed Feathers Itching, Shortness Of Breath and Swelling   Uncaria Tomentosa (Cats Claw) Itching and Swelling   Cat Dander Other (See Comments)   Gramineae Pollens Other (See Comments), Cough and Itching    POLLEN EXTRACTS   Lactose Other (See Comments)    lactose   Other Other (See Comments)   Pollen Extract Other (See Comments), Cough and Itching    POLLEN EXTRACTS    Pertinent ROS per HPI, otherwise unremarkable      Objective:  BP 137/89   Pulse 82   Temp 97.9 F (36.6 C)   Ht 5' 6 (1.676 m)   Wt 243 lb (110.2 kg)   SpO2 100%   BMI 39.22 kg/m    Wt Readings from Last 3 Encounters:  12/20/23 243 lb (110.2 kg)  11/22/23 245 lb (111.1 kg)  11/09/23 241 lb (109.3 kg)    Physical Exam Vitals and nursing note reviewed.  Constitutional:      General: She is not in acute distress.    Appearance: Normal appearance. She is obese. She is not ill-appearing, toxic-appearing or diaphoretic.  HENT:     Head: Normocephalic and atraumatic.     Nose: Nose normal.     Mouth/Throat:     Mouth: Mucous membranes are moist.  Eyes:     Conjunctiva/sclera: Conjunctivae normal.     Pupils: Pupils are equal, round, and reactive to light.  Cardiovascular:     Rate and Rhythm: Normal rate and regular rhythm.     Heart sounds: Normal heart sounds.  Pulmonary:     Effort: Pulmonary effort is normal.     Breath sounds: Normal breath sounds.  Musculoskeletal:  Right lower leg: No edema.     Left lower leg: No edema.  Skin:    General: Skin is warm and dry.     Capillary Refill: Capillary refill takes less than 2 seconds.  Neurological:     General: No focal  deficit present.     Mental Status: She is alert and oriented to person, place, and time.  Psychiatric:        Mood and Affect: Mood normal.        Behavior: Behavior normal.        Thought Content: Thought content normal.        Judgment: Judgment normal.     Results for orders placed or performed in visit on 08/28/23  Bayer DCA Hb A1c Waived   Collection Time: 08/28/23 11:20 AM  Result Value Ref Range   HB A1C (BAYER DCA - WAIVED) 4.8 4.8 - 5.6 %  Lipid panel   Collection Time: 08/28/23 11:25 AM  Result Value Ref Range   Cholesterol, Total 192 100 - 199 mg/dL   Triglycerides 94 0 - 149 mg/dL   HDL 40 >60 mg/dL   VLDL Cholesterol Cal 17 5 - 40 mg/dL   LDL Chol Calc (NIH) 864 (H) 0 - 99 mg/dL   Chol/HDL Ratio 4.8 (H) 0.0 - 4.4 ratio  CBC with Differential/Platelet   Collection Time: 08/28/23 11:25 AM  Result Value Ref Range   WBC 8.1 3.4 - 10.8 x10E3/uL   RBC 5.20 3.77 - 5.28 x10E6/uL   Hemoglobin 14.7 11.1 - 15.9 g/dL   Hematocrit 55.0 65.9 - 46.6 %   MCV 86 79 - 97 fL   MCH 28.3 26.6 - 33.0 pg   MCHC 32.7 31.5 - 35.7 g/dL   RDW 87.9 88.2 - 84.5 %   Platelets 265 150 - 450 x10E3/uL   Neutrophils 65 Not Estab. %   Lymphs 29 Not Estab. %   Monocytes 6 Not Estab. %   Eos 0 Not Estab. %   Basos 0 Not Estab. %   Neutrophils Absolute 5.2 1.4 - 7.0 x10E3/uL   Lymphocytes Absolute 2.4 0.7 - 3.1 x10E3/uL   Monocytes Absolute 0.5 0.1 - 0.9 x10E3/uL   EOS (ABSOLUTE) 0.0 0.0 - 0.4 x10E3/uL   Basophils Absolute 0.0 0.0 - 0.2 x10E3/uL   Immature Granulocytes 0 Not Estab. %   Immature Grans (Abs) 0.0 0.0 - 0.1 x10E3/uL  CMP14+EGFR   Collection Time: 08/28/23 11:25 AM  Result Value Ref Range   Glucose 68 (L) 70 - 99 mg/dL   BUN 12 6 - 20 mg/dL   Creatinine, Ser 9.37 0.57 - 1.00 mg/dL   eGFR 877 >40 fO/fpw/8.26   BUN/Creatinine Ratio 19 9 - 23   Sodium 142 134 - 144 mmol/L   Potassium 4.1 3.5 - 5.2 mmol/L   Chloride 101 96 - 106 mmol/L   CO2 23 20 - 29 mmol/L   Calcium  9.3  8.7 - 10.2 mg/dL   Total Protein 6.9 6.0 - 8.5 g/dL   Albumin 4.6 3.9 - 4.9 g/dL   Globulin, Total 2.3 1.5 - 4.5 g/dL   Bilirubin Total 1.3 (H) 0.0 - 1.2 mg/dL   Alkaline Phosphatase 63 44 - 121 IU/L   AST 14 0 - 40 IU/L   ALT 10 0 - 32 IU/L  Thyroid  Panel With TSH   Collection Time: 08/28/23 11:25 AM  Result Value Ref Range   TSH 1.540 0.450 - 4.500 uIU/mL   T4, Total 10.2 4.5 -  12.0 ug/dL   T3 Uptake Ratio 32 24 - 39 %   Free Thyroxine Index 3.3 1.2 - 4.9  VITAMIN D  25 Hydroxy (Vit-D Deficiency, Fractures)   Collection Time: 08/28/23 11:25 AM  Result Value Ref Range   Vit D, 25-Hydroxy 21.6 (L) 30.0 - 100.0 ng/mL  Microalbumin / creatinine urine ratio   Collection Time: 08/28/23 11:38 AM  Result Value Ref Range   Creatinine, Urine 108.6 Not Estab. mg/dL   Microalbumin, Urine 85.2 Not Estab. ug/mL   Microalb/Creat Ratio 14 0 - 29 mg/g creat       Pertinent labs & imaging results that were available during my care of the patient were reviewed by me and considered in my medical decision making.  Assessment & Plan:  Vaniyah was seen today for diabetes.  Diagnoses and all orders for this visit:  Controlled type 2 diabetes mellitus without complication, without long-term current use of insulin  (HCC) -     Bayer DCA Hb A1c Waived  Hyperlipidemia associated with type 2 diabetes mellitus (HCC) -     Bayer DCA Hb A1c Waived  Hypertension associated with type 2 diabetes mellitus (HCC) -     Bayer DCA Hb A1c Waived  Morbid obesity (HCC) -     Bayer DCA Hb A1c Waived  Irritable bowel syndrome with constipation -     linaclotide  (LINZESS ) 145 MCG CAPS capsule; Take 1 capsule (145 mcg total) by mouth daily before breakfast.  OSA on CPAP Follow up with neurology for CPAP adjustments.      Daytime Fatigue Referral for a sleep study with neurology has been made to assess for potential sleep apnea. Mounjaro  and Zepbound  are the same medication, tirzepatide , used for different  indications. Zepbound  is indicated for weight loss and sleep apnea, while Mounjaro  is for diabetes management. - Follow up with neurology regarding the sleep study appointment for sleep apnea assessment. - If no contact from neurology in the next week, inform the provider.  Type 2 Diabetes Mellitus Diabetes is well-controlled, possibly too controlled, with current medication regimen. Blood glucose levels at home are reported to be between 82 and 89 mg/dL. Monitoring of A1c is planned to assess current control and adjust medication if necessary. Mounjaro , a GLP-1 and GIP receptor agonist, is currently used for diabetes management. - Check A1c level today. - Consider adjusting Mounjaro  dosage based on A1c results. - Continue current diabetes management plan and monitor blood glucose levels at home.  Constipation associated with Irritable Bowel Syndrome (IBS) He experiences constipation and has been using Linzess  intermittently. The goal is to achieve regular bowel movements by using Linzess  consistently. Linzess  should be taken daily to maintain regularity. Alternative medications include Trulance and Amitiza if insurance does not cover Linzess . - Start Linzess  at 145 mcg every morning to regulate bowel movements. - Check insurance coverage for Linzess  and inform provider if not covered. - Consider alternative medications like Trulance or Amitiza if Linzess  is not covered by insurance.  General Health Maintenance Advised on dietary modifications to support overall health, including diabetes management and IBS. Emphasis on protein intake and complex carbohydrates while avoiding simple carbohydrates. Encouraged to consume whole grains, vegetables, beans, and sweet potatoes. - Encourage small, frequent meals with high protein and low simple carbohydrates. - Incorporate complex carbohydrates such as whole grains, vegetables, beans, and sweet potatoes into diet. - Continue taking vitamin D  supplements  and medications for cholesterol and blood pressure.          Continue  all other maintenance medications.  Follow up plan: Return in 3 months (on 03/21/2024), or if symptoms worsen or fail to improve, for chronic follow up with all labs.   Continue healthy lifestyle choices, including diet (rich in fruits, vegetables, and lean proteins, and low in salt and simple carbohydrates) and exercise (at least 30 minutes of moderate physical activity daily).  Educational handout given for DM  The above assessment and management plan was discussed with the patient. The patient verbalized understanding of and has agreed to the management plan. Patient is aware to call the clinic if they develop any new symptoms or if symptoms persist or worsen. Patient is aware when to return to the clinic for a follow-up visit. Patient educated on when it is appropriate to go to the emergency department.   Rosaline Bruns, FNP-C [facility] [phone]

## 2023-12-20 NOTE — Patient Instructions (Addendum)

## 2023-12-21 DIAGNOSIS — Z0279 Encounter for issue of other medical certificate: Secondary | ICD-10-CM

## 2023-12-28 ENCOUNTER — Ambulatory Visit: Admitting: Family Medicine

## 2024-01-10 ENCOUNTER — Encounter: Payer: Self-pay | Admitting: Family Medicine

## 2024-01-13 ENCOUNTER — Other Ambulatory Visit: Payer: Self-pay | Admitting: Family Medicine

## 2024-01-13 DIAGNOSIS — E119 Type 2 diabetes mellitus without complications: Secondary | ICD-10-CM

## 2024-01-16 ENCOUNTER — Encounter (INDEPENDENT_AMBULATORY_CARE_PROVIDER_SITE_OTHER): Payer: Self-pay | Admitting: Family Medicine

## 2024-01-16 DIAGNOSIS — K581 Irritable bowel syndrome with constipation: Secondary | ICD-10-CM

## 2024-01-17 ENCOUNTER — Other Ambulatory Visit: Payer: Self-pay | Admitting: *Deleted

## 2024-01-17 DIAGNOSIS — E559 Vitamin D deficiency, unspecified: Secondary | ICD-10-CM

## 2024-01-17 MED ORDER — LINACLOTIDE 290 MCG PO CAPS: 290.0000 ug | ORAL_CAPSULE | Freq: Every day | ORAL | 3 refills | Status: AC

## 2024-01-17 NOTE — Telephone Encounter (Signed)
 It looks like on her last Vit d level you had advised to take 2000 units daily otc. Ok to increase linzess  or does pt ntbs?

## 2024-01-18 ENCOUNTER — Other Ambulatory Visit: Payer: Self-pay | Admitting: Medical Genetics

## 2024-01-22 NOTE — Telephone Encounter (Signed)
 Total time spent with patient today was 6 minutes, this time was spent reviewing prior charts, labs, x-rays, discussing plan of care, and documenting the encounter.

## 2024-01-31 ENCOUNTER — Other Ambulatory Visit (HOSPITAL_COMMUNITY)
Admission: RE | Admit: 2024-01-31 | Discharge: 2024-01-31 | Disposition: A | Discharge status: Home / Self Care | Payer: Self-pay | Source: Ambulatory Visit | Attending: Medical Genetics | Admitting: Medical Genetics

## 2024-02-10 LAB — GENECONNECT MOLECULAR SCREEN: Genetic Analysis Overall Interpretation: NEGATIVE

## 2024-03-25 ENCOUNTER — Ambulatory Visit: Admitting: Family Medicine

## 2024-03-25 DIAGNOSIS — K219 Gastro-esophageal reflux disease without esophagitis: Secondary | ICD-10-CM

## 2024-03-25 DIAGNOSIS — I7300 Raynaud's syndrome without gangrene: Secondary | ICD-10-CM

## 2024-03-25 DIAGNOSIS — I152 Hypertension secondary to endocrine disorders: Secondary | ICD-10-CM

## 2024-03-25 DIAGNOSIS — E559 Vitamin D deficiency, unspecified: Secondary | ICD-10-CM

## 2024-03-25 DIAGNOSIS — E119 Type 2 diabetes mellitus without complications: Secondary | ICD-10-CM

## 2024-03-28 ENCOUNTER — Encounter: Payer: Self-pay | Admitting: Family Medicine

## 2024-04-14 ENCOUNTER — Encounter: Payer: Self-pay | Admitting: Radiology

## 2024-05-07 ENCOUNTER — Telehealth: Payer: Self-pay | Admitting: Allergy

## 2024-05-07 NOTE — Telephone Encounter (Signed)
 Tried to reach out to patient to schedule Fasenra  reapproval appointment. Phone line seems to be disconnected.
